# Patient Record
Sex: Male | Born: 1935 | Race: Black or African American | Hispanic: No | Marital: Married | Smoking: Never smoker
Health system: Southern US, Community
[De-identification: ages and names within clinical notes are randomized; demographics above are authoritative.]

## PROBLEM LIST (undated history)

## (undated) DIAGNOSIS — K264 Chronic or unspecified duodenal ulcer with hemorrhage: Secondary | ICD-10-CM

## (undated) DIAGNOSIS — F0390 Unspecified dementia without behavioral disturbance: Secondary | ICD-10-CM

## (undated) DIAGNOSIS — E119 Type 2 diabetes mellitus without complications: Secondary | ICD-10-CM

## (undated) DIAGNOSIS — E785 Hyperlipidemia, unspecified: Secondary | ICD-10-CM

## (undated) HISTORY — DX: Type 2 diabetes mellitus without complications: E11.9

## (undated) HISTORY — DX: Hyperlipidemia, unspecified: E78.5

## (undated) HISTORY — DX: Unspecified dementia, unspecified severity, without behavioral disturbance, psychotic disturbance, mood disturbance, and anxiety: F03.90

## (undated) HISTORY — DX: Chronic or unspecified duodenal ulcer with hemorrhage: K26.4

## (undated) HISTORY — PX: APPENDECTOMY: SHX54

---

## 2001-02-09 ENCOUNTER — Ambulatory Visit (HOSPITAL_COMMUNITY): Admission: RE | Admit: 2001-02-09 | Discharge: 2001-02-09 | Payer: Self-pay | Admitting: Gastroenterology

## 2006-05-25 ENCOUNTER — Emergency Department (HOSPITAL_COMMUNITY): Admission: EM | Admit: 2006-05-25 | Discharge: 2006-05-25 | Payer: Self-pay | Admitting: Emergency Medicine

## 2011-05-22 ENCOUNTER — Other Ambulatory Visit: Payer: Self-pay | Admitting: Gastroenterology

## 2011-05-22 ENCOUNTER — Ambulatory Visit (HOSPITAL_COMMUNITY)
Admission: RE | Admit: 2011-05-22 | Discharge: 2011-05-22 | Disposition: A | Discharge status: Home / Self Care | Payer: Medicare HMO | Source: Ambulatory Visit | Attending: Gastroenterology | Admitting: Gastroenterology

## 2011-05-22 DIAGNOSIS — D126 Benign neoplasm of colon, unspecified: Secondary | ICD-10-CM | POA: Insufficient documentation

## 2011-05-22 DIAGNOSIS — Z860100 Personal history of colon polyps, unspecified: Secondary | ICD-10-CM | POA: Insufficient documentation

## 2011-05-22 DIAGNOSIS — I10 Essential (primary) hypertension: Secondary | ICD-10-CM | POA: Insufficient documentation

## 2011-05-22 DIAGNOSIS — Z01812 Encounter for preprocedural laboratory examination: Secondary | ICD-10-CM | POA: Insufficient documentation

## 2011-05-22 DIAGNOSIS — Z79899 Other long term (current) drug therapy: Secondary | ICD-10-CM | POA: Insufficient documentation

## 2011-05-22 DIAGNOSIS — N400 Enlarged prostate without lower urinary tract symptoms: Secondary | ICD-10-CM | POA: Insufficient documentation

## 2011-05-22 DIAGNOSIS — Z1211 Encounter for screening for malignant neoplasm of colon: Secondary | ICD-10-CM | POA: Insufficient documentation

## 2011-05-22 DIAGNOSIS — E119 Type 2 diabetes mellitus without complications: Secondary | ICD-10-CM | POA: Insufficient documentation

## 2011-05-22 DIAGNOSIS — E7800 Pure hypercholesterolemia, unspecified: Secondary | ICD-10-CM | POA: Insufficient documentation

## 2011-05-22 LAB — GLUCOSE, CAPILLARY: Glucose-Capillary: 229 mg/dL — ABNORMAL HIGH (ref 70–99)

## 2011-05-29 NOTE — Op Note (Signed)
  [name], [name] NO.:  [id number]  MEDICAL RECORD NO.:  [id number]  LOCATION:  [facility]                         FACILITY:  [facility]  PHYSICIAN:  Danise Edge, M.D.   DATE OF BIRTH:  Jul 02, 1933  DATE OF PROCEDURE:  05/22/2011 DATE OF DISCHARGE:                              OPERATIVE REPORT   PROCEDURE:  Surveillance colonoscopy.  REFERRING PHYSICIAN:  Dr. Cam Hai.  HISTORY:  Ryan Gilmore is a 77 year old male, born on May 22, 2009.  In 2002, the patient underwent a screening colonoscopy with removal of adenomatous colon polyps.  In 2007, the patient's surveillance colonoscopy was normal.  ENDOSCOPIST:  Danise Edge, M.D.  PREMEDICATION:  Fentanyl 50 mcg, Versed 5 mg.  PROCEDURE IN DETAIL:  The patient was placed in the left lateral decubitus position.  Anal inspection and digital rectal exam were normal.  The Pentax pediatric colonoscope was introduced into the rectum and easily advanced to the cecum.  A normal-appearing ileocecal valve and appendiceal orifice were identified.  Colonic preparation for exam today was good.  Rectum normal.  Retroflexed view of the distal rectum normal.  Sigmoid colon.  A 3 mm sessile polyp was removed from the sigmoid colon with cold snare and submitted for pathologic interpretation.  Descending colon normal.  Splenic flexure normal.  Transverse colon normal.  Hepatic flexure normal.  Ascending colon.  From the distal ascending colon, a 5 mm sessile polyp was removed with cold snare and submitted for pathologic interpretation.  Cecum and ileocecal valve normal.  ASSESSMENT:  A small polyp was removed from the ascending colon and a small polyp was removed from the sigmoid colon with the cold snare. Both specimens were submitted in one bottle for pathological evaluation.  RECOMMENDATIONS:  Repeat surveillance colonoscopy in 5 years.          ______________________________ Danise Edge,  M.D.     MJ/MEDQ  D:  05/22/2011  T:  05/23/2011  Job:  [id number]  cc:   Dr. Cam Hai  Electronically Signed by Danise Edge M.D. on 05/29/2011 04:48:00 PM

## 2012-05-04 ENCOUNTER — Other Ambulatory Visit: Payer: Self-pay | Admitting: Family Medicine

## 2012-05-04 DIAGNOSIS — R413 Other amnesia: Secondary | ICD-10-CM

## 2012-05-06 ENCOUNTER — Ambulatory Visit
Admission: RE | Admit: 2012-05-06 | Discharge: 2012-05-06 | Disposition: A | Discharge status: Home / Self Care | Payer: Medicare HMO | Source: Ambulatory Visit | Attending: Family Medicine | Admitting: Family Medicine

## 2012-05-06 DIAGNOSIS — R413 Other amnesia: Secondary | ICD-10-CM

## 2012-05-06 MED ORDER — GADOBENATE DIMEGLUMINE 529 MG/ML IV SOLN
18.0000 mL | Freq: Once | INTRAVENOUS | Status: AC | PRN
  Administered 2012-05-06: 18 mL via INTRAVENOUS

## 2013-06-27 ENCOUNTER — Ambulatory Visit: Payer: Medicare HMO | Admitting: Endocrinology

## 2013-06-28 ENCOUNTER — Other Ambulatory Visit: Payer: Self-pay | Admitting: *Deleted

## 2013-06-28 ENCOUNTER — Ambulatory Visit (INDEPENDENT_AMBULATORY_CARE_PROVIDER_SITE_OTHER): Payer: Medicare HMO | Admitting: Endocrinology

## 2013-06-28 ENCOUNTER — Encounter: Payer: Self-pay | Admitting: Endocrinology

## 2013-06-28 VITALS — BP 120/80 | HR 86 | Temp 97.9°F | Ht 69.0 in | Wt 192.4 lb

## 2013-06-28 DIAGNOSIS — E782 Mixed hyperlipidemia: Secondary | ICD-10-CM | POA: Insufficient documentation

## 2013-06-28 DIAGNOSIS — IMO0001 Reserved for inherently not codable concepts without codable children: Secondary | ICD-10-CM

## 2013-06-28 DIAGNOSIS — E119 Type 2 diabetes mellitus without complications: Secondary | ICD-10-CM | POA: Insufficient documentation

## 2013-06-28 DIAGNOSIS — Z23 Encounter for immunization: Secondary | ICD-10-CM

## 2013-06-28 DIAGNOSIS — E785 Hyperlipidemia, unspecified: Secondary | ICD-10-CM | POA: Insufficient documentation

## 2013-06-28 LAB — COMPREHENSIVE METABOLIC PANEL
ALT: 14 U/L (ref 0–53)
AST: 16 U/L (ref 0–37)
Albumin: 4.2 g/dL (ref 3.5–5.2)
CO2: 31 mEq/L (ref 19–32)
Calcium: 9.5 mg/dL (ref 8.4–10.5)
Chloride: 103 mEq/L (ref 96–112)
GFR: 96.25 mL/min (ref >60.00)
Potassium: 4 mEq/L (ref 3.5–5.1)
Sodium: 138 mEq/L (ref 135–145)
Total Protein: 7 g/dL (ref 6.0–8.3)

## 2013-06-28 LAB — LIPID PANEL
LDL Cholesterol: 110 mg/dL — ABNORMAL HIGH (ref 0–99)
Total CHOL/HDL Ratio: 4
Triglycerides: 71 mg/dL (ref 0.0–149.0)

## 2013-06-28 LAB — MICROALBUMIN / CREATININE URINE RATIO
Creatinine,U: 23.7 mg/dL
Microalb Creat Ratio: 0.4 mg/g (ref 0.0–30.0)
Microalb, Ur: 0.1 mg/dL (ref 0.0–1.9)

## 2013-06-28 NOTE — Patient Instructions (Addendum)
Exercise at least 4x per week  Please check blood sugars at least half the time about 2 hours after any meal and as directed on waking up. Please bring blood sugar monitor to each visit  Watch diet

## 2013-06-28 NOTE — Progress Notes (Addendum)
Patient ID: Ryan Gilmore, male   DOB: [birth year], 79 y.o.   MRN: [id number]  Ryan Gilmore is an 79 y.o. male.   Reason for Appointment: Diabetes follow-up   History of Present Illness   Diagnosis: Type 2 DIABETES MELITUS, date of diagnosis: ? 2005     Previous history: He has previously been on metformin, Amaryl and Januvia for diabetes management but has shown progressive increase in his blood sugars over the last few years requiring escalation of treatment. Also has had difficulty with compliance with diet in the past but had done better with dietary education. Because of inadequate control Actos was added in 01/2013 and was increased to 30 mg in 6/14  Recent history: Although his A1c had come down to 7% in 2/14 his blood sugars appear to be relatively higher since then. On his last visit his Actos was increased, this was started in 4/14 but his overall blood sugars are about the same He is again not exercising and not motivated to do so. His wife is stating that he is quite noncompliant with diet especially eating sweets He is checking his blood sugar sporadically and mostly in the morning. Blood sugars are consistently high later in the day     Oral hypoglycemic drugs: Actos, metformin and Januvia        Side effects from medications: None       Monitors blood glucose: Once a day.    Glucometer: One Touch.          Blood Glucose readings from meter download: readings before breakfast: 117-153 with median about 140. Before and after supper 171-263 with only 4 readings  Hypoglycemia frequency:  none        Meals: 3 meals per day.          Physical activity: exercise: Minimal, occasionally playing softball           Dietician visit: Most recent: 11/2012         The last HbgA1c was reported as 7.8%, previous range 7-10.7%    Wt Readings from Last 3 Encounters:  06/28/13 192 lb 6.4 oz (87.272 kg)    LABS:  Office Visit on 06/28/2013  Component Date Value Range Status  . Microalb, Ur  06/28/2013 0.1  0.0 - 1.9 mg/dL Final  . Creatinine,U 40/98/1191 23.7   Final  . Microalb Creat Ratio 06/28/2013 0.4  0.0 - 30.0 mg/g Final  . Hemoglobin A1C 06/28/2013 7.9* 4.6 - 6.5 % Final   Glycemic Control Guidelines for People with Diabetes:Non Diabetic:  <6%Goal of Therapy: <7%Additional Action Suggested:  >8%   . Cholesterol 06/28/2013 170  0 - 200 mg/dL Final   ATP III Classification       Desirable:  < 200 mg/dL               Borderline High:  200 - 239 mg/dL          High:  > = 478 mg/dL  . Triglycerides 06/28/2013 71.0  0.0 - 149.0 mg/dL Final   Normal:  <295 mg/dLBorderline High:  150 - 199 mg/dL  . HDL 06/28/2013 45.70  >39.00 mg/dL Final  . VLDL 62/13/0865 14.2  0.0 - 40.0 mg/dL Final  . LDL Cholesterol 06/28/2013 110* 0 - 99 mg/dL Final  . Total CHOL/HDL Ratio 06/28/2013 4   Final                  Men  Women1/2 Average Risk     3.4          3.3Average Risk          5.0          4.42X Average Risk          9.6          7.13X Average Risk          15.0          11.0                      . Sodium 06/28/2013 138  135 - 145 mEq/L Final  . Potassium 06/28/2013 4.0  3.5 - 5.1 mEq/L Final  . Chloride 06/28/2013 103  96 - 112 mEq/L Final  . CO2 06/28/2013 31  19 - 32 mEq/L Final  . Glucose, Bld 06/28/2013 222* 70 - 99 mg/dL Final  . BUN 81/19/1478 10  6 - 23 mg/dL Final  . Creatinine, Ser 06/28/2013 1.0  0.4 - 1.5 mg/dL Final  . Total Bilirubin 06/28/2013 0.7  0.3 - 1.2 mg/dL Final  . Alkaline Phosphatase 06/28/2013 42  39 - 117 U/L Final  . AST 06/28/2013 16  0 - 37 U/L Final  . ALT 06/28/2013 14  0 - 53 U/L Final  . Total Protein 06/28/2013 7.0  6.0 - 8.3 g/dL Final  . Albumin 29/56/2130 4.2  3.5 - 5.2 g/dL Final  . Calcium 86/57/8469 9.5  8.4 - 10.5 mg/dL Final  . GFR 62/95/2841 96.25  >60.00 mL/min Final      Medication List       This list is accurate as of: 06/28/13 11:59 PM.  Always use your most recent med list.               glimepiride 4 MG tablet   Commonly known as:  AMARYL  Take 4 mg by mouth. 1/2 twice daily     JANUVIA 100 MG tablet  Generic drug:  sitaGLIPtin  Take 100 mg by mouth daily.     metFORMIN 500 MG tablet  Commonly known as:  GLUCOPHAGE  Take 1,000 mg by mouth 2 (two) times daily with a meal.     ONE TOUCH ULTRA TEST test strip  Generic drug:  glucose blood  1 each by Other route daily.     pioglitazone 30 MG tablet  Commonly known as:  ACTOS  Take 30 mg by mouth daily.     rosuvastatin 40 MG tablet  Commonly known as:  CRESTOR  Take 40 mg by mouth daily.        Allergies: Not on File  History reviewed. No pertinent past medical history.  History reviewed. No pertinent past surgical history.  History reviewed. No pertinent family history.  Social History:  reports that he has never smoked. His smokeless tobacco use includes Chew. His alcohol and drug histories are not on file.  Review of Systems:  Hypertension:  he has been on Avapro for this and blood pressure is excellent without lightheadedness  Lipids: Has previously had taking Lipitor and his last LDL was 77, probably changed to Crestor by PCP Has history of erectile dysfunction No history of tingling or numbness in his feet No leg edema      Examination:   BP 120/80  Pulse 86  Temp(Src) 97.9 F (36.6 C) (Oral)  Wt 192 lb 6.4 oz (87.272 kg)  SpO2 97%  There is no height on file  to calculate BMI.    ASSESSMENT/ PLAN::   Diabetes type 2   Blood glucose control is somewhat worse and this probably indicates some progression of his diabetes he is on multiple drugs already. 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 Advised him that this should help him with dietary compliance and carbohydrate cravings as well as weight loss, satiety and better postprandial  blood sugars. Discussed possible side effects and he will considered this on the next visit Encouraged him to watch his diet better and start walking regularly for exercise He will check more readings after meals also Discussed blood sugar targets, timing of glucose monitoring, weight loss  Hypertension: Continue Avapro  Hyperlipidemia: Not clear if he is taking his Crestor regularly and his LDL is much higher than usual. Will forward information to PCP  Counseling time over 50% of today's 25 minute visit  Burnham Trost 06/29/2013, 1:01 PM   Addendum: Apparently not taking Avapro or Crestor. Given him the option of using Lipitor 20 mg daily for lower cost and continuing to watch his blood pressure without medication for now

## 2013-06-29 ENCOUNTER — Encounter: Payer: Self-pay | Admitting: Endocrinology

## 2013-08-25 ENCOUNTER — Other Ambulatory Visit (INDEPENDENT_AMBULATORY_CARE_PROVIDER_SITE_OTHER): Payer: Commercial Managed Care - HMO

## 2013-08-25 LAB — FRUCTOSAMINE: Fructosamine: 319 umol/L — ABNORMAL HIGH (ref <285)

## 2013-08-25 LAB — BASIC METABOLIC PANEL
BUN: 8 mg/dL (ref 6–23)
Calcium: 9.5 mg/dL (ref 8.4–10.5)
Creatinine, Ser: 0.8 mg/dL (ref 0.4–1.5)

## 2013-08-29 ENCOUNTER — Ambulatory Visit (INDEPENDENT_AMBULATORY_CARE_PROVIDER_SITE_OTHER): Payer: Commercial Managed Care - HMO | Admitting: Endocrinology

## 2013-08-29 ENCOUNTER — Encounter: Payer: Self-pay | Admitting: Endocrinology

## 2013-08-29 VITALS — BP 118/70 | HR 70 | Temp 98.4°F | Resp 12 | Ht 69.0 in | Wt 197.3 lb

## 2013-08-29 DIAGNOSIS — E785 Hyperlipidemia, unspecified: Secondary | ICD-10-CM

## 2013-08-29 NOTE — Progress Notes (Addendum)
Patient ID: Ryan Gilmore, male   DOB: 10-18-1932, 79 y.o.   MRN: [id number]  Ryan Gilmore is an 79 y.o. male.   Reason for Appointment: Diabetes follow-up   History of Present Illness   Diagnosis: Type 2 DIABETES MELITUS, date of diagnosis: ? 2005     Previous history: He has previously been on metformin, Amaryl and Januvia for diabetes management but has shown progressive increase in his blood sugars over the last few years requiring escalation of treatment. Also has had difficulty with compliance with diet in the past but had done better with dietary education. Because of inadequate control Actos was added in 01/2013 and was increased to 30 mg in 6/14 Although his A1c had come down to 7% his blood sugars appeared to be relatively higher since then.  Recent history:  In 9/14 his A1c had increased to 7.9 and he was given the option of switching Januvia to Victoza. However he was reluctant to start an injectable medication and wanted to control the blood sugars better with diet and exercise Amaryl not increased because of potential for hypoglycemia He is now on a 4 drug regimen His blood sugars have been higher periodically after meals and his wife thinks that he is not watching his diet and eating sweets at times Fructosamine indicates overall high readings. He is again not exercising and not motivated to do so.  Blood sugars are frequently high after breakfast and supper and occasionally after lunch also. Highest glucose 237     Oral hypoglycemic drugs: Actos, Amaryl, metformin and Januvia        Side effects from medications: None       Monitors blood glucose: Once a day.    Glucometer: One Touch.          Blood Glucose readings from meter download: readings before breakfast: 100-169, nonfasting 88-237 with periodic high readings late morning, late afternoon and late evening but overall median 128, checking mostly in the morning. Accuracy of download not clear since some readings had  incorrect date and time  Hypoglycemia frequency:  none        Meals: 2 meals per day: 9 AM,  2 PM.some snacks in the evening           Physical activity: exercise: Minimal, previously playing softball           Dietician visit: Most recent: 11/2012          Wt Readings from Last 3 Encounters:  08/29/13 197 lb 4.8 oz (89.495 kg)  06/28/13 192 lb 6.4 oz (87.272 kg)    LABS:  Lab Results  Component Value Date   HGBA1C 7.9* 06/28/2013   Lab Results  Component Value Date   MICROALBUR 0.1 06/28/2013   LDLCALC 110* 06/28/2013   CREATININE 0.8 08/25/2013     Appointment on 08/25/2013  Component Date Value Range Status  . Sodium 08/25/2013 135  135 - 145 mEq/L Final  . Potassium 08/25/2013 4.3  3.5 - 5.1 mEq/L Final  . Chloride 08/25/2013 101  96 - 112 mEq/L Final  . CO2 08/25/2013 28  19 - 32 mEq/L Final  . Glucose, Bld 08/25/2013 150* 70 - 99 mg/dL Final  . BUN 62/95/2841 8  6 - 23 mg/dL Final  . Creatinine, Ser 08/25/2013 0.8  0.4 - 1.5 mg/dL Final  . Calcium 32/44/0102 9.5  8.4 - 10.5 mg/dL Final  . GFR 72/53/6644 115.17  >60.00 mL/min Final  . Fructosamine 08/25/2013 319* <285  umol/L Final   Comment:                            Variations in levels of serum proteins (albumin and immunoglobulins)                          may affect fructosamine results.                                 Medication List       This list is accurate as of: 08/29/13 10:50 AM.  Always use your most recent med list.               glimepiride 4 MG tablet  Commonly known as:  AMARYL  Take 4 mg by mouth. 1/2 twice daily     JANUVIA 100 MG tablet  Generic drug:  sitaGLIPtin  Take 100 mg by mouth daily.     metFORMIN 500 MG tablet  Commonly known as:  GLUCOPHAGE  Take 1,000 mg by mouth 2 (two) times daily with a meal.     ONE TOUCH ULTRA TEST test strip  Generic drug:  glucose blood  1 each by Other route daily.     pioglitazone 30 MG tablet  Commonly known as:  ACTOS  Take 30 mg by  mouth daily.     rosuvastatin 40 MG tablet  Commonly known as:  CRESTOR  Take 40 mg by mouth daily.        Allergies: No Known Allergies  No past medical history on file.  No past surgical history on file.  Family History  Problem Relation Age of Onset  . Heart disease Father   . Diabetes Brother     Social History:  reports that he has never smoked. His smokeless tobacco use includes Chew. His alcohol and drug histories are not on file.  Review of Systems:  Hypertension:  he had been on Avapro for this and now blood pressure appears normal without any medication  Lipids: Has previously had taking Lipitor and his last LDL was 77, changed to Crestor by PCP: Taking 1/2 of 20mg  3/7 days a week for fear of side effects Has history of erectile dysfunction No history of tingling or numbness in his feet No leg edema      Examination:   BP 118/70  Pulse 70  Temp(Src) 98.4 F (36.9 C)  Resp 12  Ht 5\' 9"  (1.753 m)  Wt 197 lb 4.8 oz (89.495 kg)  BMI 29.12 kg/m2  SpO2 97%  Body mass index is 29.12 kg/(m^2).    ASSESSMENT/ PLAN::   Diabetes type 2:  Blood glucose control is still not optimal especially with high readings after meals when he is not watching his diet His fructosamine still indicates overall high readings Also he has gained significant amount of weight He has not been very motivated to exercise or follow his diet Had discussed Victoza on the last visit and again discussed this today for improving his postprandial readings, potential for weight loss, better preservation of beta cell function and avoiding insulin. Also discussed that this is not insulin again However he is still reluctant to start this time he agrees to see the dietitian for followup and review of his meal planning He will also start walking regularly either outside or at the gym He will continue  his current medications unchanged and followup in 2 months with an A1c  He will check more  readings after meals instead of mostly in the mornings which he is doing now Discussed blood sugar targets, timing of glucose monitoring, weight loss  ? Hypertension: Currently blood pressure is quite normal without medications  Hyperlipidemia: Apparently taking Crestor 20 mg, half tablet 3 times a week. Followup LDL be needed. Reassured him that this should be safe in the long run  Counseling time over 50% of today's 25 minute visit  Festus Pursel 08/29/2013, 10:50 AM

## 2013-08-29 NOTE — Patient Instructions (Signed)
Please check blood sugars at least half the time about 2 hours after any meal and as directed on waking up. Please bring blood sugar monitor to each visit  Januvia 100mg  once daily  Walk daily 30 min or more

## 2013-09-22 ENCOUNTER — Other Ambulatory Visit: Payer: Self-pay | Admitting: *Deleted

## 2013-09-22 MED ORDER — PIOGLITAZONE HCL 30 MG PO TABS: 30.0000 mg | ORAL_TABLET | Freq: Every day | ORAL | Status: DC

## 2013-09-28 ENCOUNTER — Other Ambulatory Visit: Payer: Self-pay | Admitting: *Deleted

## 2013-10-31 ENCOUNTER — Other Ambulatory Visit (INDEPENDENT_AMBULATORY_CARE_PROVIDER_SITE_OTHER): Payer: Medicare HMO

## 2013-10-31 DIAGNOSIS — IMO0001 Reserved for inherently not codable concepts without codable children: Secondary | ICD-10-CM

## 2013-10-31 DIAGNOSIS — E1165 Type 2 diabetes mellitus with hyperglycemia: Principal | ICD-10-CM

## 2013-10-31 LAB — COMPREHENSIVE METABOLIC PANEL
ALBUMIN: 4.2 g/dL (ref 3.5–5.2)
ALK PHOS: 39 U/L (ref 39–117)
ALT: 16 U/L (ref 0–53)
AST: 17 U/L (ref 0–37)
BUN: 8 mg/dL (ref 6–23)
CO2: 29 mEq/L (ref 19–32)
Calcium: 9.3 mg/dL (ref 8.4–10.5)
Chloride: 103 mEq/L (ref 96–112)
Creatinine, Ser: 0.9 mg/dL (ref 0.4–1.5)
GFR: 103.52 mL/min (ref >60.00)
GLUCOSE: 178 mg/dL — AB (ref 70–99)
POTASSIUM: 4.3 meq/L (ref 3.5–5.1)
SODIUM: 138 meq/L (ref 135–145)
TOTAL PROTEIN: 7 g/dL (ref 6.0–8.3)
Total Bilirubin: 0.7 mg/dL (ref 0.3–1.2)

## 2013-10-31 LAB — LIPID PANEL
CHOL/HDL RATIO: 4
CHOLESTEROL: 173 mg/dL (ref 0–200)
HDL: 44.5 mg/dL (ref >39.00)
LDL CALC: 112 mg/dL — AB (ref 0–99)
Triglycerides: 83 mg/dL (ref 0.0–149.0)
VLDL: 16.6 mg/dL (ref 0.0–40.0)

## 2013-10-31 LAB — HEMOGLOBIN A1C: Hgb A1c MFr Bld: 8 % — ABNORMAL HIGH (ref 4.6–6.5)

## 2013-11-03 ENCOUNTER — Encounter: Payer: Self-pay | Admitting: Endocrinology

## 2013-11-03 ENCOUNTER — Ambulatory Visit (INDEPENDENT_AMBULATORY_CARE_PROVIDER_SITE_OTHER): Payer: Medicare HMO | Admitting: Endocrinology

## 2013-11-03 VITALS — BP 128/82 | HR 70 | Temp 98.2°F | Resp 16 | Ht 69.0 in | Wt 199.8 lb

## 2013-11-03 DIAGNOSIS — E1165 Type 2 diabetes mellitus with hyperglycemia: Principal | ICD-10-CM

## 2013-11-03 DIAGNOSIS — E785 Hyperlipidemia, unspecified: Secondary | ICD-10-CM

## 2013-11-03 DIAGNOSIS — IMO0001 Reserved for inherently not codable concepts without codable children: Secondary | ICD-10-CM

## 2013-11-03 MED ORDER — CANAGLIFLOZIN 100 MG PO TABS: 100.0000 mg | ORAL_TABLET | Freq: Every day | ORAL | Status: DC

## 2013-11-03 NOTE — Patient Instructions (Addendum)
Please check blood sugars at least half the time about 2 hours after any meal and as directed on waking up.   Please bring blood sugar monitor to each visit  Invokana 100mg  in am before Bfst; when Actos out leave it off  Crestor 3x per week

## 2013-11-03 NOTE — Progress Notes (Signed)
Patient ID: Ryan Gilmore, male   DOB: 11/25/1932, 80 y.o.   MRN: [id number]   Reason for Appointment: Diabetes follow-up   History of Present Illness   Diagnosis: Type 2 DIABETES MELITUS, date of diagnosis: ? 2005     Previous history: He has previously been on metformin, Amaryl and Januvia for diabetes management but has shown progressive increase in his blood sugars over the last few years requiring escalation of treatment. Also has had difficulty with compliance with diet in the past but had done better with dietary education. Because of inadequate control Actos was added in 01/2013 and was increased to 30 mg in 6/14 Although his A1c had come down to 7% initially In 9/14 his A1c had increased to 7.9  Recent history:  His A1c is still relatively high and not controlled with current regimen of Januvia, Amaryl, metformin and Actos On his last visit he was given the and no driving Victoza but he refused Also he does not appear to be well motivated to lose weight or exercise Is also taking his blood sugars only in the morning and did not understand that postprandial blood sugars may be consistently high causing his high A1c; also on the last visit fructosamine was high     Oral hypoglycemic drugs: Actos 30 mg, Amaryl, metformin and Januvia        Side effects from medications: None       Monitors blood glucose: Once a day.    Glucometer: One Touch.          Blood Glucose readings from meter download: readings before breakfast: 98-183, median 124 Nonfasting 122, 194 Hypoglycemia frequency:  none        Meals: 2 meals per day: 9 AM,  2 PM. Has some snacks in the evening           Physical activity: exercise: none recently           Dietician visit: Most recent: 11/2012          Wt Readings from Last 3 Encounters:  11/03/13 199 lb 12.8 oz (90.629 kg)  08/29/13 197 lb 4.8 oz (89.495 kg)  06/28/13 192 lb 6.4 oz (87.272 kg)    LABS:  Lab Results  Component Value Date   HGBA1C 8.0*  10/31/2013   HGBA1C 7.9* 06/28/2013   Lab Results  Component Value Date   MICROALBUR 0.1 06/28/2013   LDLCALC 112* 10/31/2013   CREATININE 0.9 10/31/2013     Appointment on 10/31/2013  Component Date Value Range Status  . Hemoglobin A1C 10/31/2013 8.0* 4.6 - 6.5 % Final   Glycemic Control Guidelines for People with Diabetes:Non Diabetic:  <6%Goal of Therapy: <7%Additional Action Suggested:  >8%   . Sodium 10/31/2013 138  135 - 145 mEq/L Final  . Potassium 10/31/2013 4.3  3.5 - 5.1 mEq/L Final  . Chloride 10/31/2013 103  96 - 112 mEq/L Final  . CO2 10/31/2013 29  19 - 32 mEq/L Final  . Glucose, Bld 10/31/2013 178* 70 - 99 mg/dL Final  . BUN 10/31/2013 8  6 - 23 mg/dL Final  . Creatinine, Ser 10/31/2013 0.9  0.4 - 1.5 mg/dL Final  . Total Bilirubin 10/31/2013 0.7  0.3 - 1.2 mg/dL Final  . Alkaline Phosphatase 10/31/2013 39  39 - 117 U/L Final  . AST 10/31/2013 17  0 - 37 U/L Final  . ALT 10/31/2013 16  0 - 53 U/L Final  . Total Protein 10/31/2013 7.0  6.0 -  8.3 g/dL Final  . Albumin 10/31/2013 4.2  3.5 - 5.2 g/dL Final  . Calcium 10/31/2013 9.3  8.4 - 10.5 mg/dL Final  . GFR 10/31/2013 103.52  >60.00 mL/min Final  . Cholesterol 10/31/2013 173  0 - 200 mg/dL Final   ATP III Classification       Desirable:  < 200 mg/dL               Borderline High:  200 - 239 mg/dL          High:  > = 240 mg/dL  . Triglycerides 10/31/2013 83.0  0.0 - 149.0 mg/dL Final   Normal:  <150 mg/dLBorderline High:  150 - 199 mg/dL  . HDL 10/31/2013 44.50  >39.00 mg/dL Final  . VLDL 10/31/2013 16.6  0.0 - 40.0 mg/dL Final  . LDL Cholesterol 10/31/2013 112* 0 - 99 mg/dL Final  . Total CHOL/HDL Ratio 10/31/2013 4   Final                  Men          Women1/2 Average Risk     3.4          3.3Average Risk          5.0          4.42X Average Risk          9.6          7.13X Average Risk          15.0          11.0                          Medication List       This list is accurate as of: 11/03/13  8:43 AM.   Always use your most recent med list.               glimepiride 4 MG tablet  Commonly known as:  AMARYL  Take 4 mg by mouth. 1/2 twice daily     JANUVIA 100 MG tablet  Generic drug:  sitaGLIPtin  Take 100 mg by mouth daily.     metFORMIN 500 MG tablet  Commonly known as:  GLUCOPHAGE  Take 1,000 mg by mouth 2 (two) times daily with a meal.     ONE TOUCH ULTRA TEST test strip  Generic drug:  glucose blood  1 each by Other route daily.     pioglitazone 30 MG tablet  Commonly known as:  ACTOS  Take 1 tablet (30 mg total) by mouth daily.     rosuvastatin 40 MG tablet  Commonly known as:  CRESTOR  Take 40 mg by mouth daily.        Allergies: No Known Allergies  No past medical history on file.  No past surgical history on file.  Family History  Problem Relation Age of Onset  . Heart disease Father   . Diabetes Brother     Social History:  reports that he has never smoked. His smokeless tobacco use includes Chew. His alcohol and drug histories are not on file.  Review of Systems:  Hypertension:  he had been on Avapro for this and now blood pressure appears normal without any medication  Lipids: Has previously had taking Lipitor and his last LDL was 77, changed to Crestor by PCP: Taking 1/2 of 20mg  only once a week now for fear of side effects and LDL  is higher  Lab Results  Component Value Date   CHOL 173 10/31/2013   HDL 44.50 10/31/2013   LDLCALC 112* 10/31/2013   TRIG 83.0 10/31/2013   CHOLHDL 4 10/31/2013     Examination:   BP 128/82  Pulse 70  Temp(Src) 98.2 F (36.8 C)  Resp 16  Ht 5\' 9"  (1.753 m)  Wt 199 lb 12.8 oz (90.629 kg)  BMI 29.49 kg/m2  SpO2 97%  Body mass index is 29.49 kg/(m^2).   No ankle edema present  ASSESSMENT/ PLAN::   Diabetes type 2:  Blood glucose control is still poorly controlled with A1c 8% Usually has high postprandial readings but he is not monitoring these now Discussed that he has some progression of diabetes and  needs more efforts to control his diabetes including change of medications He is still reluctant to try a GLP-1 drug Also did not wish to see the dietitian again  Recommendations:  He agrees to try Invokana. Discussed actions, benefits, possible side effects and dosage. He will try 100 mg daily until next visit, samples, brochures and co-pay card given Since he is not benefiting currently from Actos will stop this when the prescription runs out He will check more readings after meals instead of mostly in the mornings which he is doing now His wife will help him be better compliant with his diet and exercise regimen and he will start walking Written instructions given  Hyperlipidemia: Apparently taking Crestor 10 mg only once week. LDL over 100 now He agrees to start taking this at least 3 times a week for better control of lipids and cardiovascular protection  Counseling time over 50% of today's 25 minute visit  Obediah Welles 11/03/2013, 8:43 AM

## 2013-12-15 ENCOUNTER — Other Ambulatory Visit (INDEPENDENT_AMBULATORY_CARE_PROVIDER_SITE_OTHER): Payer: Commercial Managed Care - HMO

## 2013-12-15 DIAGNOSIS — E1165 Type 2 diabetes mellitus with hyperglycemia: Principal | ICD-10-CM

## 2013-12-15 DIAGNOSIS — IMO0001 Reserved for inherently not codable concepts without codable children: Secondary | ICD-10-CM

## 2013-12-15 LAB — BASIC METABOLIC PANEL
BUN: 14 mg/dL (ref 6–23)
CO2: 29 mEq/L (ref 19–32)
Calcium: 9.4 mg/dL (ref 8.4–10.5)
Chloride: 103 mEq/L (ref 96–112)
Creatinine, Ser: 1 mg/dL (ref 0.4–1.5)
GFR: 96.13 mL/min (ref >60.00)
GLUCOSE: 152 mg/dL — AB (ref 70–99)
POTASSIUM: 4.3 meq/L (ref 3.5–5.1)
SODIUM: 138 meq/L (ref 135–145)

## 2013-12-19 LAB — FRUCTOSAMINE: Fructosamine: 350 umol/L — ABNORMAL HIGH (ref 190–270)

## 2013-12-21 ENCOUNTER — Ambulatory Visit (INDEPENDENT_AMBULATORY_CARE_PROVIDER_SITE_OTHER): Payer: Commercial Managed Care - HMO | Admitting: Endocrinology

## 2013-12-21 ENCOUNTER — Encounter: Payer: Self-pay | Admitting: Endocrinology

## 2013-12-21 VITALS — BP 118/72 | HR 68 | Temp 97.8°F | Resp 16 | Ht 69.0 in | Wt 201.0 lb

## 2013-12-21 DIAGNOSIS — IMO0001 Reserved for inherently not codable concepts without codable children: Secondary | ICD-10-CM

## 2013-12-21 DIAGNOSIS — E1165 Type 2 diabetes mellitus with hyperglycemia: Principal | ICD-10-CM

## 2013-12-21 NOTE — Patient Instructions (Signed)
Please check blood sugars at least half the time about 2 hours after any meal and as directed on waking up. Please bring blood sugar monitor to each visit  Protein at Bfst, low fat meals

## 2013-12-21 NOTE — Progress Notes (Signed)
Patient ID: Ryan Gilmore, male   DOB: [birth year], 80 y.o.   MRN: [id number]   Reason for Appointment: Diabetes follow-up   History of Present Illness   Diagnosis: Type 2 DIABETES MELITUS, date of diagnosis: ? 2005     Previous history: He has previously been on metformin, Amaryl and Januvia for diabetes management but has shown progressive increase in his blood sugars over the last few years requiring escalation of treatment. Also has had difficulty with compliance with diet in the past but had done better with dietary education. Because of inadequate control Actos was added in 01/2013 and was increased to 30 mg in 6/14 Although his A1c had come down to 7% initially In 9/14 his A1c had increased to 7.9  Recent history:  His fructosamine is relatively higher  Because of higher A1c was asked to start Invokana on his last visit. However apparently was not covered by his insurance and he could not start taking it and also did not let us know about it Currently on a  regimen of Januvia, Amaryl, metformin and Actos Recently his fasting blood sugars are better but overall they are still averaging the same as last time He has checked only a few readings sporadically after meals and some of them are high especially after breakfast He is still very reluctant to consider additional medications or changes Occasionally still goes off his diet Has not started exercising consistently as yet    Oral hypoglycemic drugs: Actos 30 mg, Amaryl, metformin and Januvia        Side effects from medications: None       Monitors blood glucose: Once a day.    Glucometer: One Touch.          Blood Glucose readings from meter download:   PREMEAL Breakfast Lunch Dinner  late p.m.  Overall  Glucose range:  90-181   133-263   93-167   141-194    Mean/median:  125   152    183   133    Hypoglycemia frequency:  none        Meals: 2 meals per day: 9 AM,  2 PM. Oatmeal with root in am; Has some snacks in the evening, less  fast food and fried food recently           Physical activity: exercise: none recently           Dietician visit: Most recent: 11/2012          Wt Readings from Last 3 Encounters:  12/21/13 201 lb (91.173 kg)  11/03/13 199 lb 12.8 oz (90.629 kg)  08/29/13 197 lb 4.8 oz (89.495 kg)    LABS:  Lab Results  Component Value Date   HGBA1C 8.0* 10/31/2013   HGBA1C 7.9* 06/28/2013   Lab Results  Component Value Date   MICROALBUR 0.1 06/28/2013   LDLCALC 112* 10/31/2013   CREATININE 1.0 12/15/2013     Appointment on 12/15/2013  Component Date Value Ref Range Status  . Fructosamine 12/15/2013 350* 190 - 270 umol/L Final  . Sodium 12/15/2013 138  135 - 145 mEq/L Final  . Potassium 12/15/2013 4.3  3.5 - 5.1 mEq/L Final  . Chloride 12/15/2013 103  96 - 112 mEq/L Final  . CO2 12/15/2013 29  19 - 32 mEq/L Final  . Glucose, Bld 12/15/2013 152* 70 - 99 mg/dL Final  . BUN 12/15/2013 14  6 - 23 mg/dL Final  . Creatinine, Ser 12/15/2013 1.0  0.4 -  1.5 mg/dL Final  . Calcium 12/15/2013 9.4  8.4 - 10.5 mg/dL Final  . GFR 12/15/2013 96.13  >60.00 mL/min Final      Medication List       This list is accurate as of: 12/21/13  9:22 AM.  Always use your most recent med list.               Canagliflozin 100 MG Tabs  Take 1 tablet (100 mg total) by mouth daily before breakfast.     glimepiride 4 MG tablet  Commonly known as:  AMARYL  Take 4 mg by mouth. 1/2 twice daily     JANUVIA 100 MG tablet  Generic drug:  sitaGLIPtin  Take 100 mg by mouth daily.     metFORMIN 500 MG tablet  Commonly known as:  GLUCOPHAGE  Take 1,000 mg by mouth 2 (two) times daily with a meal.     ONE TOUCH ULTRA TEST test strip  Generic drug:  glucose blood  1 each by Other route daily.     pioglitazone 30 MG tablet  Commonly known as:  ACTOS  Take 1 tablet (30 mg total) by mouth daily.     rosuvastatin 40 MG tablet  Commonly known as:  CRESTOR  Take 40 mg by mouth daily.        Allergies: No Known  Allergies  No past medical history on file.  No past surgical history on file.  Family History  Problem Relation Age of Onset  . Heart disease Father   . Diabetes Brother     Social History:  reports that he has never smoked. His smokeless tobacco use includes Chew. His alcohol and drug histories are not on file.  Review of Systems:  Hypertension:  he had been on Avapro for this and now blood pressure appears normal without any medication  Lipids: Has previously had been taking Lipitor and previously LDL was 77, changed to Crestor by PCP:  Taking 1/2 of 20mg  only once a week now for fear of side effects and LDL is higher  Lab Results  Component Value Date   CHOL 173 10/31/2013   HDL 44.50 10/31/2013   LDLCALC 112* 10/31/2013   TRIG 83.0 10/31/2013   CHOLHDL 4 10/31/2013     Examination:   BP 118/72  Pulse 68  Temp(Src) 97.8 F (36.6 C)  Resp 16  Ht 5\' 9"  (1.753 m)  Wt 201 lb (91.173 kg)  BMI 29.67 kg/m2  SpO2 93%  Body mass index is 29.67 kg/(m^2).   No ankle edema present  ASSESSMENT/ PLAN::   Diabetes type 2:  Blood glucose control is still poorly controlled with high fructosamine and previous A1c was 8% Although he is a good candidate for Invokana it is apparently not covered by insurance He is reluctant to consider changing his medications because he thinks he can do much better with diet and also more regular exercise Currently does have inconsistent diet and appears to understand the need for improving this Frequently has high postprandial readings but he is not monitoring these very much and this was discussed today  Recommendations: Have mostly repeated whatever we had discussed on the last visit to improve his compliance He was also checked to see if his insurance covers alternatives to Invokana Continue present medications including Actos for now He will check more readings after meals instead of mostly in the mornings which he is doing now His wife  will help him be better compliant with his  diet and exercise regimen and he will start walking Written instructions given  Allanna Bresee 12/21/2013, 9:22 AM

## 2014-02-13 ENCOUNTER — Other Ambulatory Visit: Payer: Self-pay | Admitting: *Deleted

## 2014-02-13 MED ORDER — GLUCOSE BLOOD VI STRP: ORAL_STRIP | Status: DC

## 2014-02-20 ENCOUNTER — Other Ambulatory Visit (INDEPENDENT_AMBULATORY_CARE_PROVIDER_SITE_OTHER): Payer: Commercial Managed Care - HMO

## 2014-02-20 DIAGNOSIS — E1165 Type 2 diabetes mellitus with hyperglycemia: Principal | ICD-10-CM

## 2014-02-20 DIAGNOSIS — IMO0001 Reserved for inherently not codable concepts without codable children: Secondary | ICD-10-CM

## 2014-02-20 LAB — COMPREHENSIVE METABOLIC PANEL
ALBUMIN: 4.2 g/dL (ref 3.5–5.2)
ALK PHOS: 38 U/L — AB (ref 39–117)
ALT: 16 U/L (ref 0–53)
AST: 21 U/L (ref 0–37)
BUN: 10 mg/dL (ref 6–23)
CALCIUM: 9.1 mg/dL (ref 8.4–10.5)
CO2: 28 mEq/L (ref 19–32)
Chloride: 102 mEq/L (ref 96–112)
Creatinine, Ser: 1 mg/dL (ref 0.4–1.5)
GFR: 98.43 mL/min (ref >60.00)
Glucose, Bld: 152 mg/dL — ABNORMAL HIGH (ref 70–99)
Potassium: 4 mEq/L (ref 3.5–5.1)
SODIUM: 137 meq/L (ref 135–145)
TOTAL PROTEIN: 6.8 g/dL (ref 6.0–8.3)
Total Bilirubin: 0.7 mg/dL (ref 0.2–1.2)

## 2014-02-20 LAB — MICROALBUMIN / CREATININE URINE RATIO
CREATININE, U: 47.3 mg/dL
MICROALB UR: 0.2 mg/dL (ref 0.0–1.9)
Microalb Creat Ratio: 0.4 mg/g (ref 0.0–30.0)

## 2014-02-20 LAB — LIPID PANEL
Cholesterol: 174 mg/dL (ref 0–200)
HDL: 42.2 mg/dL (ref >39.00)
LDL Cholesterol: 122 mg/dL — ABNORMAL HIGH (ref 0–99)
Total CHOL/HDL Ratio: 4
Triglycerides: 49 mg/dL (ref 0.0–149.0)
VLDL: 9.8 mg/dL (ref 0.0–40.0)

## 2014-02-20 LAB — HEMOGLOBIN A1C: HEMOGLOBIN A1C: 8.2 % — AB (ref 4.6–6.5)

## 2014-02-23 ENCOUNTER — Ambulatory Visit: Payer: Medicare HMO | Admitting: Endocrinology

## 2014-03-02 ENCOUNTER — Ambulatory Visit (INDEPENDENT_AMBULATORY_CARE_PROVIDER_SITE_OTHER): Payer: Commercial Managed Care - HMO | Admitting: Endocrinology

## 2014-03-02 ENCOUNTER — Encounter: Payer: Self-pay | Admitting: Endocrinology

## 2014-03-02 VITALS — BP 118/70 | HR 75 | Temp 97.8°F | Resp 14 | Ht 69.0 in | Wt 200.0 lb

## 2014-03-02 DIAGNOSIS — E785 Hyperlipidemia, unspecified: Secondary | ICD-10-CM

## 2014-03-02 DIAGNOSIS — E1165 Type 2 diabetes mellitus with hyperglycemia: Principal | ICD-10-CM

## 2014-03-02 DIAGNOSIS — E669 Obesity, unspecified: Secondary | ICD-10-CM | POA: Diagnosis not present

## 2014-03-02 DIAGNOSIS — IMO0001 Reserved for inherently not codable concepts without codable children: Secondary | ICD-10-CM | POA: Diagnosis not present

## 2014-03-02 MED ORDER — VICTOZA 18 MG/3ML ~~LOC~~ SOPN: 1.2000 mg | PEN_INJECTOR | Freq: Every day | SUBCUTANEOUS | Status: DC

## 2014-03-02 NOTE — Patient Instructions (Addendum)
Please check blood sugars at least half the time about 2 hours after any meal (target 160) and as 3x weekly on waking up. Please bring blood sugar monitor to each visit  Start VICTOZA injection with the sample pen once daily at the same time of the day.  Dial the dose to 0.6 mg for the first week.  You may  experience nausea in the first few days which usually gets better the After 1 week increase the dose to 1.2mg  daily if no nausea.  You may inject in the stomach, thigh or arm.   You will feel fullness of the stomach with starting the medication and should try to keep portions of food small.    Stop Januvia  And stop PM dose of Glimeperide

## 2014-03-02 NOTE — Progress Notes (Signed)
Patient ID: Ryan Gilmore, male   DOB: Jun 09, 1933, 80 y.o.   MRN: [id number]   Reason for Appointment: Diabetes follow-up   History of Present Illness   Diagnosis: Type 2 DIABETES MELITUS, date of diagnosis: ? 2005     Previous history: He has previously been on metformin, Amaryl and Januvia for diabetes management but has shown progressive increase in his blood sugars over the last few years requiring escalation of treatment. Also has had difficulty with compliance with diet in the past but had done better with dietary education. Because of inadequate control Actos was added in 01/2013 and was increased to 30 mg in 6/14 Although his A1c had come down to 7% initially In 9/14 his A1c had increased to 7.9  Recent history:  His A1c is relatively higher again and still over 8% Because of higher A1c he was offered Invokana in addition to his regimen of 4 drugs.  However this and probably Wilder Glade were  not covered by his insurance and he could not use these drugs He has been consistently refusing to consider an injectable drug for better efficacy He promises that he will try to do better with diet and exercise but has not done well at all with this and has not lost any significant amount of weight either Currently on a  regimen of Januvia, Amaryl, metformin and Actos Recently his fasting blood sugars are overall higher, previously were averaging 125 Again despite reminders he is hardly ever checking his readings after lunch or dinner Most of his readings at night or close to or more than 200    Oral hypoglycemic drugs: Actos 30 mg, Amaryl, metformin and Januvia        Side effects from medications: None       Monitors blood glucose: Once a day.    Glucometer: One Touch.          Blood Glucose readings from meter download:   PREMEAL Breakfast Lunch Dinner Bedtime Overall  Glucose range:  107-218   96-233  ?   198-234    Median:  150   160    216   154    Hypoglycemia frequency:  none         Meals: 2 meals per day: 9 AM,  2 PM. Oatmeal with fruit in am; Has some snacks in the evening occasionally fried food sweets          Physical activity: exercise: Occasional walking recently           Dietician visit: Most recent: 11/2012          Wt Readings from Last 3 Encounters:  03/02/14 200 lb (90.719 kg)  12/21/13 201 lb (91.173 kg)  11/03/13 199 lb 12.8 oz (90.629 kg)    LABS:  Lab Results  Component Value Date   HGBA1C 8.2* 02/20/2014   HGBA1C 8.0* 10/31/2013   HGBA1C 7.9* 06/28/2013   Lab Results  Component Value Date   MICROALBUR 0.2 02/20/2014   LDLCALC 122* 02/20/2014   CREATININE 1.0 02/20/2014     No visits with results within 1 Week(s) from this visit. Latest known visit with results is:  Appointment on 02/20/2014  Component Date Value Ref Range Status  . Hemoglobin A1C 02/20/2014 8.2* 4.6 - 6.5 % Final   Glycemic Control Guidelines for People with Diabetes:Non Diabetic:  <6%Goal of Therapy: <7%Additional Action Suggested:  >8%   . Cholesterol 02/20/2014 174  0 - 200 mg/dL Final   ATP III  Classification       Desirable:  < 200 mg/dL               Borderline High:  200 - 239 mg/dL          High:  > = 240 mg/dL  . Triglycerides 02/20/2014 49.0  0.0 - 149.0 mg/dL Final   Normal:  <150 mg/dLBorderline High:  150 - 199 mg/dL  . HDL 02/20/2014 42.20  >39.00 mg/dL Final  . VLDL 02/20/2014 9.8  0.0 - 40.0 mg/dL Final  . LDL Cholesterol 02/20/2014 122* 0 - 99 mg/dL Final  . Total CHOL/HDL Ratio 02/20/2014 4   Final                  Men          Women1/2 Average Risk     3.4          3.3Average Risk          5.0          4.42X Average Risk          9.6          7.13X Average Risk          15.0          11.0                      . Microalb, Ur 02/20/2014 0.2  0.0 - 1.9 mg/dL Final  . Creatinine,U 02/20/2014 47.3   Final  . Microalb Creat Ratio 02/20/2014 0.4  0.0 - 30.0 mg/g Final  . Sodium 02/20/2014 137  135 - 145 mEq/L Final  . Potassium 02/20/2014 4.0  3.5 - 5.1  mEq/L Final  . Chloride 02/20/2014 102  96 - 112 mEq/L Final  . CO2 02/20/2014 28  19 - 32 mEq/L Final  . Glucose, Bld 02/20/2014 152* 70 - 99 mg/dL Final  . BUN 02/20/2014 10  6 - 23 mg/dL Final  . Creatinine, Ser 02/20/2014 1.0  0.4 - 1.5 mg/dL Final  . Total Bilirubin 02/20/2014 0.7  0.2 - 1.2 mg/dL Final  . Alkaline Phosphatase 02/20/2014 38* 39 - 117 U/L Final  . AST 02/20/2014 21  0 - 37 U/L Final  . ALT 02/20/2014 16  0 - 53 U/L Final  . Total Protein 02/20/2014 6.8  6.0 - 8.3 g/dL Final  . Albumin 02/20/2014 4.2  3.5 - 5.2 g/dL Final  . Calcium 02/20/2014 9.1  8.4 - 10.5 mg/dL Final  . GFR 02/20/2014 98.43  >60.00 mL/min Final      Medication List       This list is accurate as of: 03/02/14  8:18 AM.  Always use your most recent med list.               glimepiride 4 MG tablet  Commonly known as:  AMARYL  Take 4 mg by mouth. 1/2 twice daily     glucose blood test strip  Commonly known as:  ONE TOUCH ULTRA TEST  Use to check blood sugar two times per day dx code 250.02     JANUVIA 100 MG tablet  Generic drug:  sitaGLIPtin  Take 100 mg by mouth daily.     metFORMIN 500 MG tablet  Commonly known as:  GLUCOPHAGE  Take 1,000 mg by mouth 2 (two) times daily with a meal.     pioglitazone 30 MG tablet  Commonly known as:  ACTOS  Take 1  tablet (30 mg total) by mouth daily.     rosuvastatin 40 MG tablet  Commonly known as:  CRESTOR  Take 40 mg by mouth daily.        Allergies: No Known Allergies  No past medical history on file.  Past Surgical History  Procedure Laterality Date  . Appendectomy      Family History  Problem Relation Age of Onset  . Heart disease Father   . Diabetes Brother     Social History:  reports that he has never smoked. His smokeless tobacco use includes Chew. His alcohol and drug histories are not on file.  Review of Systems:  Hypertension:  he had been on Avapro for this and now blood pressure is consistently normal without  any medication  Lipids: Has previously had been taking Lipitor and previously LDL was 77, changed to Crestor by PCP:  Out of Crestor and cannot afford the prescription; refuses to take Lipitor because of fear of memory problems  Lab Results  Component Value Date   CHOL 174 02/20/2014   HDL 42.20 02/20/2014   LDLCALC 122* 02/20/2014   TRIG 49.0 02/20/2014   CHOLHDL 4 02/20/2014     Examination:   BP 118/70  Pulse 75  Temp(Src) 97.8 F (36.6 C)  Resp 14  Ht 5\' 9"  (1.753 m)  Wt 200 lb (90.719 kg)  BMI 29.52 kg/m2  SpO2 95%  Body mass index is 29.52 kg/(m^2).   No ankle edema present  ASSESSMENT/ PLAN:  Diabetes type 2:  Blood glucose control is still poorly controlled with high A1c of over 8% Also his home readings appear to be relatively higher than the last time Although he is a good candidate for Invokana on Farxiga these are apparently not covered by insurance He is again very reluctant to consider another drug including Victoza Were discussed with him that because of his progression of his diabetes and lack of weight loss this would be an ideal treatment.  Discussed with the patient the nature of GLP-1 drugs, the action on various organ systems, how they benefit blood glucose control, as well as the benefit of weight loss and  increase satiety . Explained possible side effects especially nausea and vomiting; discussed safety information in package insert. Described injection technique and dosage titration of Victoza  starting with 0.6 mg once a day at the same time for the first week and then increasing to 1.2 mg if no symptoms of nausea. Patient brochure on Victoza and co-pay card given  Currently does not have inconsistent diet and has difficulty being compliant Also has not exercised regularly Frequently has high postprandial readings but he is not monitoring much after the evening meal and this was discussed today  Recommendations:   Start Victoza, discussed dosage  titration  Demonstrated how to use the Victoza pen  However he still wants to think about it and will have him bring the prescription to the nurse educator for further discussion and to try the first injection in the office  With starting Victoza will reduce his Advair to once a day in the morning only  More readings after meals  Regular exercise  Given discussed using pravastatin with his PCP  Counseling time over 50% of today's 25 minute visit  Elayne Snare 03/02/2014, 8:18 AM

## 2014-03-06 ENCOUNTER — Encounter: Payer: Medicare HMO | Attending: Endocrinology | Admitting: Nutrition

## 2014-03-06 DIAGNOSIS — Z713 Dietary counseling and surveillance: Secondary | ICD-10-CM | POA: Insufficient documentation

## 2014-03-06 DIAGNOSIS — E1165 Type 2 diabetes mellitus with hyperglycemia: Principal | ICD-10-CM

## 2014-03-06 DIAGNOSIS — IMO0001 Reserved for inherently not codable concepts without codable children: Secondary | ICD-10-CM

## 2014-03-07 ENCOUNTER — Encounter: Payer: Commercial Managed Care - HMO | Admitting: Nutrition

## 2014-03-07 NOTE — Patient Instructions (Signed)
1.  Inject Victoza once a day. 2. Give 0.6 for 7 days, and increase the dose to 1.2 after 7 days if no nausea.   3.  Test blood sugars before and 2hr. After one meal each day.

## 2014-03-07 NOTE — Progress Notes (Signed)
This patient is here with wife and daughter to learn how to inject Victoza.   We discussed how to this medication works, side effects and how to increase the dose.  They reported good understanding of these topics and had no final questions.    He had brought his medication from the pharmacy, and was instructed on how to use the pen, and how to inject.  He did this without any difficulty and reported good understanding of this.  We reviewed his dosage adjustment, and he reported good understanding.  They had no final quesitons.

## 2014-03-27 ENCOUNTER — Other Ambulatory Visit: Payer: Self-pay | Admitting: Endocrinology

## 2014-03-30 ENCOUNTER — Encounter: Payer: Self-pay | Admitting: Endocrinology

## 2014-03-30 ENCOUNTER — Ambulatory Visit (INDEPENDENT_AMBULATORY_CARE_PROVIDER_SITE_OTHER): Payer: Commercial Managed Care - HMO | Admitting: Endocrinology

## 2014-03-30 VITALS — BP 126/74 | HR 89 | Temp 98.3°F | Resp 16 | Ht 69.0 in | Wt 196.2 lb

## 2014-03-30 DIAGNOSIS — IMO0001 Reserved for inherently not codable concepts without codable children: Secondary | ICD-10-CM | POA: Diagnosis not present

## 2014-03-30 DIAGNOSIS — E1165 Type 2 diabetes mellitus with hyperglycemia: Principal | ICD-10-CM

## 2014-03-30 NOTE — Patient Instructions (Addendum)
Please check blood sugars at least half the time about 2 hours after any meal and times per week on waking up. Please bring blood sugar monitor to each visit  Walk daily

## 2014-03-30 NOTE — Progress Notes (Signed)
Patient ID: Ryan Gilmore, male   DOB: 05-05-1933, 80 y.o.   MRN: [id number]   Reason for Appointment: Diabetes follow-up   History of Present Illness   Diagnosis: Type 2 DIABETES MELITUS, date of diagnosis: ? 2005     Previous history: He has previously been on metformin, Amaryl and Januvia for diabetes management but has shown progressive increase in his blood sugars over the last few years requiring escalation of treatment. Also has had difficulty with compliance with diet in the past but had done better with dietary education. Because of inadequate control Actos was added in 01/2013 and was increased to 30 mg in 6/14 Although his A1c had come down to 7% initially In 9/14 his A1c had increased to 7.9  Recent history:  Because of higher A1c and persistently high readings he was started on Victoza instead of Januvia in 5/15 He had previously been persistently reluctant to start an injectable drug With this his blood sugars have improved significantly and he thinks he has more energy also He is taking the injection in the morning and has no difficulty doing this. Also has no side effects like nausea or GI problems Amaryl was reduced to once a day He has done only a few readings after meals and it may be sporadically higher possibly from inconsistent diet However overall his portion control is better with starting Victoza    Oral hypoglycemic drugs: Actos 30 mg, Amaryl, metformin       Side effects from medications: None       Monitors blood glucose: Once a day.    Glucometer: One Touch.          Blood Glucose readings from meter download:   Fasting range 97-268 with median 123 and nonfasting 69-268 with overall median 123  Hypoglycemia frequency:  none        Meals: 2 meals per day: 9 AM,  2 PM. Oatmeal with fruit in am; Has some snacks in the evening occasionally fried food sweets          Physical activity: exercise: Occasional walking recently           Dietician visit: Most recent:  11/2012          Wt Readings from Last 3 Encounters:  03/30/14 196 lb 3.2 oz (88.996 kg)  03/02/14 200 lb (90.719 kg)  12/21/13 201 lb (91.173 kg)    LABS:  Lab Results  Component Value Date   HGBA1C 8.2* 02/20/2014   HGBA1C 8.0* 10/31/2013   HGBA1C 7.9* 06/28/2013   Lab Results  Component Value Date   MICROALBUR 0.2 02/20/2014   LDLCALC 122* 02/20/2014   CREATININE 1.0 02/20/2014     No visits with results within 1 Week(s) from this visit. Latest known visit with results is:  Appointment on 02/20/2014  Component Date Value Ref Range Status  . Hemoglobin A1C 02/20/2014 8.2* 4.6 - 6.5 % Final   Glycemic Control Guidelines for People with Diabetes:Non Diabetic:  <6%Goal of Therapy: <7%Additional Action Suggested:  >8%   . Cholesterol 02/20/2014 174  0 - 200 mg/dL Final   ATP III Classification       Desirable:  < 200 mg/dL               Borderline High:  200 - 239 mg/dL          High:  > = 240 mg/dL  . Triglycerides 02/20/2014 49.0  0.0 - 149.0 mg/dL Final   Normal:  <  150 mg/dLBorderline High:  150 - 199 mg/dL  . HDL 02/20/2014 42.20  >39.00 mg/dL Final  . VLDL 02/20/2014 9.8  0.0 - 40.0 mg/dL Final  . LDL Cholesterol 02/20/2014 122* 0 - 99 mg/dL Final  . Total CHOL/HDL Ratio 02/20/2014 4   Final                  Men          Women1/2 Average Risk     3.4          3.3Average Risk          5.0          4.42X Average Risk          9.6          7.13X Average Risk          15.0          11.0                      . Microalb, Ur 02/20/2014 0.2  0.0 - 1.9 mg/dL Final  . Creatinine,U 02/20/2014 47.3   Final  . Microalb Creat Ratio 02/20/2014 0.4  0.0 - 30.0 mg/g Final  . Sodium 02/20/2014 137  135 - 145 mEq/L Final  . Potassium 02/20/2014 4.0  3.5 - 5.1 mEq/L Final  . Chloride 02/20/2014 102  96 - 112 mEq/L Final  . CO2 02/20/2014 28  19 - 32 mEq/L Final  . Glucose, Bld 02/20/2014 152* 70 - 99 mg/dL Final  . BUN 02/20/2014 10  6 - 23 mg/dL Final  . Creatinine, Ser 02/20/2014 1.0  0.4  - 1.5 mg/dL Final  . Total Bilirubin 02/20/2014 0.7  0.2 - 1.2 mg/dL Final  . Alkaline Phosphatase 02/20/2014 38* 39 - 117 U/L Final  . AST 02/20/2014 21  0 - 37 U/L Final  . ALT 02/20/2014 16  0 - 53 U/L Final  . Total Protein 02/20/2014 6.8  6.0 - 8.3 g/dL Final  . Albumin 02/20/2014 4.2  3.5 - 5.2 g/dL Final  . Calcium 02/20/2014 9.1  8.4 - 10.5 mg/dL Final  . GFR 02/20/2014 98.43  >60.00 mL/min Final      Medication List       This list is accurate as of: 03/30/14  9:06 AM.  Always use your most recent med list.               glimepiride 4 MG tablet  Commonly known as:  AMARYL  Take 4 mg by mouth. 1 tablet daily     glucose blood test strip  Commonly known as:  ONE TOUCH ULTRA TEST  Use to check blood sugar two times per day dx code 250.02     JANUVIA 100 MG tablet  Generic drug:  sitaGLIPtin  Take 100 mg by mouth daily.     metFORMIN 500 MG tablet  Commonly known as:  GLUCOPHAGE  Take 1,000 mg by mouth 2 (two) times daily with a meal.     pioglitazone 30 MG tablet  Commonly known as:  ACTOS  TAKE ONE TABLET BY MOUTH ONCE DAILY     rosuvastatin 40 MG tablet  Commonly known as:  CRESTOR  Take 40 mg by mouth daily.     VICTOZA 18 MG/3ML Sopn  Generic drug:  Liraglutide  Inject 1.2 mg into the skin daily. Inject once daily at the same time        Allergies: No  Known Allergies  No past medical history on file.  Past Surgical History  Procedure Laterality Date  . Appendectomy      Family History  Problem Relation Age of Onset  . Heart disease Father   . Diabetes Brother     Social History:  reports that he has never smoked. His smokeless tobacco use includes Chew. His alcohol and drug histories are not on file.  Review of Systems:  Hypertension:  he had been on Avapro for this and now blood pressure is consistently normal without any medication  Lipids: Has previously had been taking Lipitor and previously LDL was 77, changed to Crestor by PCP:   Out of Crestor and cannot afford the prescription; refuses to take Lipitor because of fear of memory problems He was asked to discuss pravastatin with his PCP  Lab Results  Component Value Date   CHOL 174 02/20/2014   HDL 42.20 02/20/2014   LDLCALC 122* 02/20/2014   TRIG 49.0 02/20/2014   CHOLHDL 4 02/20/2014     Examination:   BP 126/74  Pulse 89  Temp(Src) 98.3 F (36.8 C)  Resp 16  Ht 5\' 9"  (1.753 m)  Wt 196 lb 3.2 oz (88.996 kg)  BMI 28.96 kg/m2  SpO2 95%  Body mass index is 28.96 kg/(m^2).     ASSESSMENT/ PLAN:  Diabetes type 2:  Blood glucose control is significantly better with using Victoza instead of Januvia As discussed in history of present illness his blood sugars are excellent in the mornings Again he is not checking enough readings after meals and discussed rotating the time of testing Also encouraged him to increase his exercise and be consistent with diet He will come back in 2 months for A1c   KUMAR,AJAY 03/30/2014, 9:06 AM

## 2014-04-27 ENCOUNTER — Telehealth: Payer: Self-pay | Admitting: Endocrinology

## 2014-04-27 ENCOUNTER — Other Ambulatory Visit: Payer: Self-pay | Admitting: *Deleted

## 2014-04-27 MED ORDER — VICTOZA 18 MG/3ML ~~LOC~~ SOPN: 1.2000 mg | PEN_INJECTOR | Freq: Every day | SUBCUTANEOUS | Status: DC

## 2014-04-27 NOTE — Telephone Encounter (Signed)
rx sent, patient aware 

## 2014-04-27 NOTE — Telephone Encounter (Signed)
Patient need a prescription refill of Victoza  18 mg, he need within the next ten days. Thank you

## 2014-05-26 ENCOUNTER — Other Ambulatory Visit (INDEPENDENT_AMBULATORY_CARE_PROVIDER_SITE_OTHER): Payer: Commercial Managed Care - HMO

## 2014-05-26 DIAGNOSIS — E1165 Type 2 diabetes mellitus with hyperglycemia: Principal | ICD-10-CM

## 2014-05-26 DIAGNOSIS — IMO0001 Reserved for inherently not codable concepts without codable children: Secondary | ICD-10-CM

## 2014-05-27 LAB — COMPREHENSIVE METABOLIC PANEL
ALT: 14 U/L (ref 0–53)
AST: 17 U/L (ref 0–37)
Albumin: 4.1 g/dL (ref 3.5–5.2)
Alkaline Phosphatase: 38 U/L — ABNORMAL LOW (ref 39–117)
BILIRUBIN TOTAL: 0.5 mg/dL (ref 0.2–1.2)
BUN: 10 mg/dL (ref 6–23)
CO2: 27 mEq/L (ref 19–32)
CREATININE: 1 mg/dL (ref 0.4–1.5)
Calcium: 9.2 mg/dL (ref 8.4–10.5)
Chloride: 102 mEq/L (ref 96–112)
GFR: 98.36 mL/min (ref >60.00)
Glucose, Bld: 120 mg/dL — ABNORMAL HIGH (ref 70–99)
Potassium: 4.1 mEq/L (ref 3.5–5.1)
SODIUM: 137 meq/L (ref 135–145)
TOTAL PROTEIN: 6.5 g/dL (ref 6.0–8.3)

## 2014-05-27 LAB — HEMOGLOBIN A1C: Hgb A1c MFr Bld: 7.5 % — ABNORMAL HIGH (ref 4.6–6.5)

## 2014-05-29 ENCOUNTER — Other Ambulatory Visit: Payer: Commercial Managed Care - HMO

## 2014-06-01 ENCOUNTER — Ambulatory Visit (INDEPENDENT_AMBULATORY_CARE_PROVIDER_SITE_OTHER): Payer: Commercial Managed Care - HMO | Admitting: Endocrinology

## 2014-06-01 ENCOUNTER — Encounter: Payer: Self-pay | Admitting: Endocrinology

## 2014-06-01 VITALS — BP 126/78 | HR 75 | Temp 98.2°F | Resp 16 | Ht 69.0 in | Wt 192.2 lb

## 2014-06-01 DIAGNOSIS — IMO0001 Reserved for inherently not codable concepts without codable children: Secondary | ICD-10-CM

## 2014-06-01 DIAGNOSIS — E1165 Type 2 diabetes mellitus with hyperglycemia: Principal | ICD-10-CM

## 2014-06-01 NOTE — Progress Notes (Signed)
Patient ID: Ryan Gilmore, male   DOB: [birth year], 80 y.o.   MRN: [id number]   Reason for Appointment: Diabetes follow-up   History of Present Illness   Diagnosis: Type 2 DIABETES MELITUS, date of diagnosis: ? 2005     Previous history: He has previously been on metformin, Amaryl and Januvia for diabetes management but has shown progressive increase in his blood sugars over the last few years requiring escalation of treatment. Also has had difficulty with compliance with diet in the past but had done better with dietary education. Because of inadequate control Actos was added in 01/2013 and was increased to 30 mg in 6/14 Although his A1c had come down to 7% initially In 9/14 his A1c had increased to 7.9  Recent history:  Because of higher A1c and persistently high readings he was started on Victoza instead of Januvia in 5/15 He had previously been persistently reluctant to start an injectable drug With this his blood sugars improved and he has lost a total of 8 pounds now He is taking the injection in the morning and has no nausea However he is concerned about the cost of the medication He has again done only a few readings after meals and mostly in the morning His fasting readings are mildly increased but as low as 102 He is sometimes not watching his diet and blood sugars may be as high as 165 in the morning Also not always getting much protein at breakfast However overall his portion control is better with starting Victoza    Oral hypoglycemic drugs: Actos 30 mg, Amaryl 1/2 in am, metformin       Side effects from medications: None       Monitors blood glucose:  1.2 times a day.    Glucometer: One Touch.          Blood Glucose readings from meter download:   PREMEAL Breakfast Lunch Dinner Bedtime Overall  Glucose range: 102-165  131-146   105, 152   128-167    Mean/median:  135    131   Hypoglycemia frequency:  none        Meals: 2 meals per day: 9 AM,  2 PM. Oatmeal, walnuts with  fruit in am; Has some snacks in the evening occasionally fried food sweets          Physical activity: exercise: Occasional walking recently           Dietician visit: Most recent: 11/2012          Wt Readings from Last 3 Encounters:  06/01/14 192 lb 3.2 oz (87.181 kg)  03/30/14 196 lb 3.2 oz (88.996 kg)  03/02/14 200 lb (90.719 kg)    LABS:  Lab Results  Component Value Date   HGBA1C 7.5* 05/26/2014   HGBA1C 8.2* 02/20/2014   HGBA1C 8.0* 10/31/2013   Lab Results  Component Value Date   MICROALBUR 0.2 02/20/2014   LDLCALC 122* 02/20/2014   CREATININE 1.0 05/26/2014     Appointment on 05/26/2014  Component Date Value Ref Range Status  . Hemoglobin A1C 05/26/2014 7.5* 4.6 - 6.5 % Final   Glycemic Control Guidelines for People with Diabetes:Non Diabetic:  <6%Goal of Therapy: <7%Additional Action Suggested:  >8%   . Sodium 05/26/2014 137  135 - 145 mEq/L Final  . Potassium 05/26/2014 4.1  3.5 - 5.1 mEq/L Final  . Chloride 05/26/2014 102  96 - 112 mEq/L Final  . CO2 05/26/2014 27  19 - 32 mEq/L Final  .  Glucose, Bld 05/26/2014 120* 70 - 99 mg/dL Final  . BUN 05/26/2014 10  6 - 23 mg/dL Final  . Creatinine, Ser 05/26/2014 1.0  0.4 - 1.5 mg/dL Final  . Total Bilirubin 05/26/2014 0.5  0.2 - 1.2 mg/dL Final  . Alkaline Phosphatase 05/26/2014 38* 39 - 117 U/L Final  . AST 05/26/2014 17  0 - 37 U/L Final  . ALT 05/26/2014 14  0 - 53 U/L Final  . Total Protein 05/26/2014 6.5  6.0 - 8.3 g/dL Final  . Albumin 05/26/2014 4.1  3.5 - 5.2 g/dL Final  . Calcium 05/26/2014 9.2  8.4 - 10.5 mg/dL Final  . GFR 05/26/2014 98.36  >60.00 mL/min Final      Medication List       This list is accurate as of: 06/01/14  8:59 AM.  Always use your most recent med list.               glimepiride 4 MG tablet  Commonly known as:  AMARYL  Take 4 mg by mouth. 1 tablet daily     glucose blood test strip  Commonly known as:  ONE TOUCH ULTRA TEST  Use to check blood sugar two times per day dx code  250.02     metFORMIN 500 MG tablet  Commonly known as:  GLUCOPHAGE  Take 1,000 mg by mouth 2 (two) times daily with a meal.     pioglitazone 30 MG tablet  Commonly known as:  ACTOS  TAKE ONE TABLET BY MOUTH ONCE DAILY     rosuvastatin 40 MG tablet  Commonly known as:  CRESTOR  Take 40 mg by mouth daily.     VICTOZA 18 MG/3ML Sopn  Generic drug:  Liraglutide  Inject 1.2 mg into the skin daily. Inject once daily at the same time        Allergies: No Known Allergies  No past medical history on file.  Past Surgical History  Procedure Laterality Date  . Appendectomy      Family History  Problem Relation Age of Onset  . Heart disease Father   . Diabetes Brother     Social History:  reports that he has never smoked. His smokeless tobacco use includes Chew. His alcohol and drug histories are not on file.  Review of Systems:  Hypertension:  he had been on Avapro previously  and now blood pressure is consistently normal without any medication  Lipids: Has previously had been taking Lipitor and previously LDL was 77, changed to Crestor by PCP:  Out of Crestor and cannot afford the prescription; refuses to take Lipitor because of fear of memory problems He was asked to discuss pravastatin with his PCP  Lab Results  Component Value Date   CHOL 174 02/20/2014   HDL 42.20 02/20/2014   LDLCALC 122* 02/20/2014   TRIG 49.0 02/20/2014   CHOLHDL 4 02/20/2014     Examination:   BP 126/78  Pulse 75  Temp(Src) 98.2 F (36.8 C)  Resp 16  Ht 5\' 9"  (1.753 m)  Wt 192 lb 3.2 oz (87.181 kg)  BMI 28.37 kg/m2  SpO2 93%  Body mass index is 28.37 kg/(m^2).     ASSESSMENT/ PLAN:  Diabetes type 2:  Blood glucose control is  fairly good and A1c is improving although higher than expected at 7.5 Most of his blood sugars are relatively good at home and less variable Again he is not checking enough readings after meals and discussed  more readings after  any of the meals Also reminded  him to add more protein to breakfast and reduce carbohydrate  Have given him the assistance form for Victoza from the [facility] 06/01/2014, 8:59 AM

## 2014-06-01 NOTE — Patient Instructions (Signed)
Please check blood sugars at least half the time about 2 hours after any meal and 3 times per week on waking up.  Please bring blood sugar monitor to each visit    

## 2014-06-16 ENCOUNTER — Telehealth: Payer: Self-pay | Admitting: Endocrinology

## 2014-06-16 NOTE — Telephone Encounter (Signed)
Patient wife would like for you to call her concerning patient Victoza 18 mg/3 ml [phone]

## 2014-08-28 ENCOUNTER — Ambulatory Visit (INDEPENDENT_AMBULATORY_CARE_PROVIDER_SITE_OTHER): Payer: Commercial Managed Care - HMO | Admitting: Endocrinology

## 2014-08-28 ENCOUNTER — Encounter: Payer: Self-pay | Admitting: Endocrinology

## 2014-08-28 VITALS — BP 141/84 | HR 79 | Temp 98.0°F | Resp 16 | Ht 69.0 in | Wt 197.0 lb

## 2014-08-28 DIAGNOSIS — E1165 Type 2 diabetes mellitus with hyperglycemia: Secondary | ICD-10-CM

## 2014-08-28 DIAGNOSIS — E785 Hyperlipidemia, unspecified: Secondary | ICD-10-CM

## 2014-08-28 DIAGNOSIS — IMO0002 Reserved for concepts with insufficient information to code with codable children: Secondary | ICD-10-CM

## 2014-08-28 LAB — MICROALBUMIN / CREATININE URINE RATIO
Creatinine,U: 47.2 mg/dL
MICROALB/CREAT RATIO: 0.6 mg/g (ref 0.0–30.0)
Microalb, Ur: 0.3 mg/dL (ref 0.0–1.9)

## 2014-08-28 LAB — BASIC METABOLIC PANEL
BUN: 10 mg/dL (ref 6–23)
CO2: 25 mEq/L (ref 19–32)
Calcium: 9.5 mg/dL (ref 8.4–10.5)
Chloride: 102 mEq/L (ref 96–112)
Creatinine, Ser: 0.9 mg/dL (ref 0.4–1.5)
GFR: 108.8 mL/min (ref >60.00)
Glucose, Bld: 209 mg/dL — ABNORMAL HIGH (ref 70–99)
Potassium: 3.9 mEq/L (ref 3.5–5.1)
Sodium: 137 mEq/L (ref 135–145)

## 2014-08-28 LAB — LDL CHOLESTEROL, DIRECT: Direct LDL: 106.9 mg/dL

## 2014-08-28 LAB — HEMOGLOBIN A1C: HEMOGLOBIN A1C: 7.7 % — AB (ref 4.6–6.5)

## 2014-08-28 MED ORDER — GLIMEPIRIDE 1 MG PO TABS: 1.0000 mg | ORAL_TABLET | Freq: Two times a day (BID) | ORAL | Status: DC

## 2014-08-28 NOTE — Progress Notes (Signed)
Patient ID: Ryan Gilmore, male   DOB: [birth year], 80 y.o.   MRN: [id number]   Reason for Appointment: Diabetes follow-up   History of Present Illness   Diagnosis: Type 2 DIABETES MELITUS, date of diagnosis: ? 2005     Previous history: He has previously been on metformin, Amaryl and Januvia for diabetes management but has shown progressive increase in his blood sugars over the last few years requiring escalation of treatment. Also has had difficulty with compliance with diet in the past but had done better with dietary education. Because of inadequate control Actos was added in 01/2013 and was increased to 30 mg in 6/14 Although his A1c had come down to 7% initially In 9/14 his A1c had increased to 7.9 Because of higher A1c and persistently high readings he was started on Victoza instead of Januvia in 5/15  Recent history:  He had previously been persistently reluctant to start an injectable drug With Victoza his blood sugars improved and he initially lost weight but has gained back 5 pounds since his last visit He is taking the injection in the morning and has no nausea He has been able to get the injection through the company indigent program Blood sugars are looking fairly good except higher in the evenings at times when he checks them after supper.  Has done only a few readings after meals and they may be high at times Recently has also not been exercising much and has not been motivated    Oral hypoglycemic drugs: Actos 30 mg, Amaryl 1/2 in am, metformin       Side effects from medications: None       Monitors blood glucose:  1.2 times a day.    Glucometer: One Touch.          Blood Glucose readings from meter download:   PREMEAL Breakfast Lunch  6-8 PM  Bedtime Overall  Glucose range:  102-148   60-118   115-283     Median:  133    166   133    POST-MEAL PC Breakfast PC Lunch PC Dinner  Glucose range:   70-158    Mean/median:      Hypoglycemia: once around lunchtime         Meals: 2 meals per day: 9 AM,  2 PM. Oatmeal, walnuts with fruit in am; Has some snacks in the evening occasionally fried food and  sweets          Physical activity: exercise: sometimes           Dietician visit: Most recent: 11/2012          Wt Readings from Last 3 Encounters:  08/28/14 197 lb (89.359 kg)  06/01/14 192 lb 3.2 oz (87.181 kg)  03/30/14 196 lb 3.2 oz (88.996 kg)    LABS:  Lab Results  Component Value Date   HGBA1C 7.5* 05/26/2014   HGBA1C 8.2* 02/20/2014   HGBA1C 8.0* 10/31/2013   Lab Results  Component Value Date   MICROALBUR 0.2 02/20/2014   LDLCALC 122* 02/20/2014   CREATININE 1.0 05/26/2014     No visits with results within 1 Week(s) from this visit. Latest known visit with results is:  Appointment on 05/26/2014  Component Date Value Ref Range Status  . Hgb A1c MFr Bld 05/26/2014 7.5* 4.6 - 6.5 % Final   Glycemic Control Guidelines for People with Diabetes:Non Diabetic:  <6%Goal of Therapy: <7%Additional Action Suggested:  >8%   . Sodium 05/26/2014 137  135 - 145 mEq/L Final  . Potassium 05/26/2014 4.1  3.5 - 5.1 mEq/L Final  . Chloride 05/26/2014 102  96 - 112 mEq/L Final  . CO2 05/26/2014 27  19 - 32 mEq/L Final  . Glucose, Bld 05/26/2014 120* 70 - 99 mg/dL Final  . BUN 05/26/2014 10  6 - 23 mg/dL Final  . Creatinine, Ser 05/26/2014 1.0  0.4 - 1.5 mg/dL Final  . Total Bilirubin 05/26/2014 0.5  0.2 - 1.2 mg/dL Final  . Alkaline Phosphatase 05/26/2014 38* 39 - 117 U/L Final  . AST 05/26/2014 17  0 - 37 U/L Final  . ALT 05/26/2014 14  0 - 53 U/L Final  . Total Protein 05/26/2014 6.5  6.0 - 8.3 g/dL Final  . Albumin 05/26/2014 4.1  3.5 - 5.2 g/dL Final  . Calcium 05/26/2014 9.2  8.4 - 10.5 mg/dL Final  . GFR 05/26/2014 98.36  >60.00 mL/min Final      Medication List       This list is accurate as of: 08/28/14  8:47 AM.  Always use your most recent med list.               donepezil 5 MG tablet  Commonly known as:  ARICEPT      glimepiride 4 MG tablet  Commonly known as:  AMARYL  Take 4 mg by mouth. 1 tablet daily     glucose blood test strip  Commonly known as:  ONE TOUCH ULTRA TEST  Use to check blood sugar two times per day dx code 250.02     metFORMIN 500 MG tablet  Commonly known as:  GLUCOPHAGE  Take 1,000 mg by mouth 2 (two) times daily with a meal.     pioglitazone 30 MG tablet  Commonly known as:  ACTOS  TAKE ONE TABLET BY MOUTH ONCE DAILY     rosuvastatin 40 MG tablet  Commonly known as:  CRESTOR  Take 40 mg by mouth daily. Takes 1/2 tablet daily     VICTOZA 18 MG/3ML Sopn  Generic drug:  Liraglutide  Inject 1.2 mg into the skin daily. Inject once daily at the same time        Allergies: No Known Allergies  No past medical history on file.  Past Surgical History  Procedure Laterality Date  . Appendectomy      Family History  Problem Relation Age of Onset  . Heart disease Father   . Diabetes Brother     Social History:  reports that he has never smoked. His smokeless tobacco use includes Chew. His alcohol and drug histories are not on file.  Review of Systems:  Hypertension:  he had been on Avapro previously  and now blood pressure is fairly good without any medication, also followed by PCP  Lipids: Has previously had been taking Lipitor and previously LDL was 77, changed to Crestor by PCP:  However has had difficulty affording this  Lab Results  Component Value Date   CHOL 174 02/20/2014   HDL 42.20 02/20/2014   LDLCALC 122* 02/20/2014   TRIG 49.0 02/20/2014   CHOLHDL 4 02/20/2014     Examination:   BP 141/84 mmHg  Pulse 79  Temp(Src) 98 F (36.7 C)  Resp 16  Ht 5\' 9"  (1.753 m)  Wt 197 lb (89.359 kg)  BMI 29.08 kg/m2  SpO2 95%  Body mass index is 29.08 kg/(m^2).     ASSESSMENT/ PLAN:  Diabetes type 2:  Blood glucose control is  fairly good although has some high readings after meals Also fasting readings are relatively higher A1c needs to be checked  today Also discussed needing to do better with his home glucose monitoring after meals instead of mostly in the morning He will try to join the [employer] program for exercise Again needs to be consistent with his meals with balanced intake and limited amount of high-calorie foods and carbohydrates  Since he has tendency to lower readings in the afternoons and higher readings after dinner and overnight will change his 2 mg Amaryl in the morning to 1 mg twice a day  Hyperlipidemia: Needs follow-up   Sapphire Tygart 08/28/2014, 8:47 AM

## 2014-08-28 NOTE — Patient Instructions (Signed)
Please check blood sugars at least half the time about 2 hours after any meal and times per week on waking up. Please bring blood sugar monitor to each visit  Walk daily  Next Rx for Amaryl 1mg , 2x daily

## 2014-09-04 ENCOUNTER — Telehealth: Payer: Self-pay | Admitting: Endocrinology

## 2014-09-04 NOTE — Telephone Encounter (Signed)
[name] wife called stating she was returning Rhonda's call    Please advise    Thank you

## 2014-09-05 ENCOUNTER — Telehealth: Payer: Self-pay | Admitting: *Deleted

## 2014-09-05 NOTE — Telephone Encounter (Signed)
Patient wife ask if you would return her call.

## 2014-09-05 NOTE — Telephone Encounter (Signed)
[name] wife called about her husbands cholesterol, she said he's not taking anything for it, and can't tolerate Atorvastatin.

## 2014-09-05 NOTE — Telephone Encounter (Signed)
What side effects does he have from Lipitor.  He can try pravastatin 40 mg daily

## 2014-09-06 ENCOUNTER — Other Ambulatory Visit: Payer: Self-pay | Admitting: *Deleted

## 2014-09-06 MED ORDER — PRAVASTATIN SODIUM 40 MG PO TABS: 40.0000 mg | ORAL_TABLET | Freq: Every day | ORAL | Status: DC

## 2014-09-06 NOTE — Telephone Encounter (Signed)
rx sent

## 2014-10-01 ENCOUNTER — Other Ambulatory Visit: Payer: Self-pay | Admitting: Endocrinology

## 2014-11-03 DIAGNOSIS — M25511 Pain in right shoulder: Secondary | ICD-10-CM | POA: Diagnosis not present

## 2014-11-23 ENCOUNTER — Other Ambulatory Visit (INDEPENDENT_AMBULATORY_CARE_PROVIDER_SITE_OTHER): Payer: Commercial Managed Care - HMO

## 2014-11-23 DIAGNOSIS — IMO0002 Reserved for concepts with insufficient information to code with codable children: Secondary | ICD-10-CM

## 2014-11-23 DIAGNOSIS — E1165 Type 2 diabetes mellitus with hyperglycemia: Secondary | ICD-10-CM

## 2014-11-23 LAB — COMPREHENSIVE METABOLIC PANEL
ALK PHOS: 43 U/L (ref 39–117)
ALT: 14 U/L (ref 0–53)
AST: 17 U/L (ref 0–37)
Albumin: 4.3 g/dL (ref 3.5–5.2)
BILIRUBIN TOTAL: 0.5 mg/dL (ref 0.2–1.2)
BUN: 9 mg/dL (ref 6–23)
CO2: 30 meq/L (ref 19–32)
CREATININE: 0.94 mg/dL (ref 0.40–1.50)
Calcium: 9.5 mg/dL (ref 8.4–10.5)
Chloride: 104 mEq/L (ref 96–112)
GFR: 99.44 mL/min (ref >60.00)
Glucose, Bld: 146 mg/dL — ABNORMAL HIGH (ref 70–99)
Potassium: 4.5 mEq/L (ref 3.5–5.1)
SODIUM: 137 meq/L (ref 135–145)
TOTAL PROTEIN: 6.9 g/dL (ref 6.0–8.3)

## 2014-11-23 LAB — HEMOGLOBIN A1C: Hgb A1c MFr Bld: 7.8 % — ABNORMAL HIGH (ref 4.6–6.5)

## 2014-11-28 ENCOUNTER — Encounter: Payer: Self-pay | Admitting: Endocrinology

## 2014-11-28 ENCOUNTER — Ambulatory Visit (INDEPENDENT_AMBULATORY_CARE_PROVIDER_SITE_OTHER): Payer: Commercial Managed Care - HMO | Admitting: Endocrinology

## 2014-11-28 VITALS — BP 138/80 | HR 69 | Temp 97.9°F | Wt 202.0 lb

## 2014-11-28 DIAGNOSIS — E785 Hyperlipidemia, unspecified: Secondary | ICD-10-CM | POA: Diagnosis not present

## 2014-11-28 DIAGNOSIS — E1165 Type 2 diabetes mellitus with hyperglycemia: Secondary | ICD-10-CM

## 2014-11-28 DIAGNOSIS — IMO0002 Reserved for concepts with insufficient information to code with codable children: Secondary | ICD-10-CM

## 2014-11-28 MED ORDER — VICTOZA 18 MG/3ML ~~LOC~~ SOPN
1.8000 mg | PEN_INJECTOR | Freq: Every day | SUBCUTANEOUS | Status: DC
Start: 2014-11-28 — End: 2015-06-12

## 2014-11-28 NOTE — Progress Notes (Signed)
Patient ID: Ryan Gilmore, male   DOB: 1933-05-26, 81 y.o.   MRN: [id number]   Reason for Appointment: Diabetes follow-up   History of Present Illness   Diagnosis: Type 2 DIABETES MELITUS, date of diagnosis: ? 2005     Previous history: He has previously been on metformin, Amaryl and Januvia for diabetes management but has shown progressive increase in his blood sugars over the last few years requiring escalation of treatment. Also has had difficulty with compliance with diet in the past but had done better with dietary education. Because of inadequate control Actos was added in 01/2013 and was increased to 30 mg in 6/14 Although his A1c had come down to 7% initially In 9/14 his A1c had increased to 7.9 Because of higher A1c and persistently high readings he was started on Victoza instead of Januvia in 5/15  Recent history:  With Victoza his blood sugars  Had improved and he initially lost weight but A1c which had come down to 7.5 is slowly higher  Also has gained back significant amount of weight which he had initially lost. He is taking the injection in the morning and has no nausea with 1.2 mg daily He has been able to get the injection through the company indigent program but his wife is concerned about the cost of the drug at this time.  Because of higher fasting readings in the morning he was told to increase his Amaryl to 1 mg twice a day and with this his fasting blood sugars are improved.    He has however not been checking many readings later in the day but most of these appear to be relatively high.   His highest reading was 239 after a steroid injection.  His A1c is still not improved and near 8% Recently has also not been exercising much and has not been motivated. He also thinks he is getting more snacks especially since he is home all day.    Oral hypoglycemic drugs: Actos 30 mg, Amaryl 1/2 bid, metformin       Side effects from medications: None       Monitors  blood glucose:  1.2 times a day.    Glucometer: One Touch.          Blood Glucose readings from meter download:   PRE-MEAL Breakfast Lunch  7-9 PM  Overall  Glucose range: 88-151  181  151- 197    Mean/median: 112    118   Hypoglycemia:  none       Meals: 2 meals per day: 9 AM,  2 PM. Oatmeal, walnuts with fruit in am; Has some snacks in the evening occasionally fried food and  sweets          Physical activity: exercise: sometimes           Dietician visit: Most recent: 11/2012          Wt Readings from Last 3 Encounters:  11/28/14 202 lb (91.627 kg)  08/28/14 197 lb (89.359 kg)  06/01/14 192 lb 3.2 oz (87.181 kg)    LABS:  Lab Results  Component Value Date   HGBA1C 7.8* 11/23/2014   HGBA1C 7.7* 08/28/2014   HGBA1C 7.5* 05/26/2014   Lab Results  Component Value Date   MICROALBUR 0.3 08/28/2014   LDLCALC 122* 02/20/2014   CREATININE 0.94 11/23/2014     Appointment on 11/23/2014  Component Date Value Ref Range Status  . Hgb A1c MFr Bld 11/23/2014 7.8* 4.6 -  6.5 % Final   Glycemic Control Guidelines for People with Diabetes:Non Diabetic:  <6%Goal of Therapy: <7%Additional Action Suggested:  >8%   . Sodium 11/23/2014 137  135 - 145 mEq/L Final  . Potassium 11/23/2014 4.5  3.5 - 5.1 mEq/L Final  . Chloride 11/23/2014 104  96 - 112 mEq/L Final  . CO2 11/23/2014 30  19 - 32 mEq/L Final  . Glucose, Bld 11/23/2014 146* 70 - 99 mg/dL Final  . BUN 11/23/2014 9  6 - 23 mg/dL Final  . Creatinine, Ser 11/23/2014 0.94  0.40 - 1.50 mg/dL Final  . Total Bilirubin 11/23/2014 0.5  0.2 - 1.2 mg/dL Final  . Alkaline Phosphatase 11/23/2014 43  39 - 117 U/L Final  . AST 11/23/2014 17  0 - 37 U/L Final  . ALT 11/23/2014 14  0 - 53 U/L Final  . Total Protein 11/23/2014 6.9  6.0 - 8.3 g/dL Final  . Albumin 11/23/2014 4.3  3.5 - 5.2 g/dL Final  . Calcium 11/23/2014 9.5  8.4 - 10.5 mg/dL Final  . GFR 11/23/2014 99.44  >60.00 mL/min Final      Medication List       This list is  accurate as of: 11/28/14  8:53 AM.  Always use your most recent med list.               donepezil 5 MG tablet  Commonly known as:  ARICEPT     glimepiride 1 MG tablet  Commonly known as:  AMARYL  Take 1 tablet (1 mg total) by mouth 2 (two) times daily.     glucose blood test strip  Commonly known as:  ONE TOUCH ULTRA TEST  Use to check blood sugar two times per day dx code 250.02     metFORMIN 500 MG tablet  Commonly known as:  GLUCOPHAGE  Take 1,000 mg by mouth 2 (two) times daily with a meal.     pioglitazone 30 MG tablet  Commonly known as:  ACTOS  TAKE ONE TABLET BY MOUTH ONCE DAILY     pravastatin 40 MG tablet  Commonly known as:  PRAVACHOL  Take 1 tablet (40 mg total) by mouth daily.     rosuvastatin 40 MG tablet  Commonly known as:  CRESTOR  Take 40 mg by mouth daily. Takes 1/2 tablet daily     VICTOZA 18 MG/3ML Sopn  Generic drug:  Liraglutide  Inject 1.2 mg into the skin daily. Inject once daily at the same time        Allergies: No Known Allergies  No past medical history on file.  Past Surgical History  Procedure Laterality Date  . Appendectomy      Family History  Problem Relation Age of Onset  . Heart disease Father   . Diabetes Brother     Social History:  reports that he has never smoked. His smokeless tobacco use includes Chew. His alcohol and drug histories are not on file.  Review of Systems:  Hypertension:  he had been on Avapro previously  and now blood pressure is fairly good without any medication, also followed by PCP  Lipids: Has previously had been taking Lipitor and previously LDL was 77, changed to Pravachol by PCP:  However has had difficulty affording this  Lab Results  Component Value Date   CHOL 174 02/20/2014   HDL 42.20 02/20/2014   LDLCALC 122* 02/20/2014   LDLDIRECT 106.9 08/28/2014   TRIG 49.0 02/20/2014   CHOLHDL 4 02/20/2014  Examination:   BP 142/90 mmHg  Pulse 69  Temp(Src) 97.9 F (36.6 C) (Oral)   Wt 202 lb (91.627 kg)  SpO2 98%  Body mass index is 29.82 kg/(m^2).    repeat blood pressure 138/80  No pedal edema  ASSESSMENT/ PLAN:  Diabetes type 2:  Blood glucose control is still inadequate with mostly high postprandial readings.    He is not active although he thinks he will be starting do more outdoor activities soon.   Because of his difficulty losing weight , postprandial hyperglycemia and difficulty controlling diet he will benefit from a higher dose of Victoza.  Advised him that he may have nausea with 1.8 mg Victoza initially.  He will also need to check the coverage for his insurance ; appears to be having similar coverage for Januvia and Victoza.    Have discussed importance of checking postprandial readings more regularly and less in the morning   Hyperlipidemia: Needs follow-up since last LDL was 107   Patient Instructions  Please check blood sugars at least half the time about 2 hours after any meal and 3 times per week on waking up. Please bring blood sugar monitor to each visit. Recommended blood sugar levels about 2 hours after meal is 140-180 and on waking up 90-130  Victoza 1.8 daily  Exercise daily      Athalee Esterline 11/28/2014, 8:53 AM

## 2014-11-28 NOTE — Patient Instructions (Addendum)
Please check blood sugars at least half the time about 2 hours after any meal and 3 times per week on waking up. Please bring blood sugar monitor to each visit. Recommended blood sugar levels about 2 hours after meal is 140-180 and on waking up 90-130  Victoza 1.8 daily  Exercise daily

## 2014-12-01 DIAGNOSIS — M25511 Pain in right shoulder: Secondary | ICD-10-CM | POA: Diagnosis not present

## 2014-12-01 DIAGNOSIS — G5621 Lesion of ulnar nerve, right upper limb: Secondary | ICD-10-CM | POA: Diagnosis not present

## 2014-12-11 DIAGNOSIS — M25511 Pain in right shoulder: Secondary | ICD-10-CM | POA: Diagnosis not present

## 2014-12-20 DIAGNOSIS — M25511 Pain in right shoulder: Secondary | ICD-10-CM | POA: Diagnosis not present

## 2014-12-22 DIAGNOSIS — R202 Paresthesia of skin: Secondary | ICD-10-CM | POA: Diagnosis not present

## 2015-01-02 ENCOUNTER — Telehealth: Payer: Self-pay | Admitting: Endocrinology

## 2015-01-02 NOTE — Telephone Encounter (Signed)
Sherry from  Dr Criss Rosales office need to know the dx code of why patient is seeing Dr Dwyane Dee. Please advise  Phone # [phone]

## 2015-01-06 ENCOUNTER — Other Ambulatory Visit: Payer: Self-pay | Admitting: Endocrinology

## 2015-01-10 DIAGNOSIS — E118 Type 2 diabetes mellitus with unspecified complications: Secondary | ICD-10-CM | POA: Diagnosis not present

## 2015-01-10 DIAGNOSIS — I10 Essential (primary) hypertension: Secondary | ICD-10-CM | POA: Diagnosis not present

## 2015-01-10 DIAGNOSIS — E785 Hyperlipidemia, unspecified: Secondary | ICD-10-CM | POA: Diagnosis not present

## 2015-01-10 DIAGNOSIS — G5621 Lesion of ulnar nerve, right upper limb: Secondary | ICD-10-CM | POA: Diagnosis not present

## 2015-01-15 DIAGNOSIS — G5621 Lesion of ulnar nerve, right upper limb: Secondary | ICD-10-CM | POA: Diagnosis not present

## 2015-01-15 DIAGNOSIS — M75121 Complete rotator cuff tear or rupture of right shoulder, not specified as traumatic: Secondary | ICD-10-CM | POA: Diagnosis not present

## 2015-01-30 ENCOUNTER — Other Ambulatory Visit: Payer: Self-pay | Admitting: *Deleted

## 2015-01-30 ENCOUNTER — Telehealth: Payer: Self-pay | Admitting: Endocrinology

## 2015-01-30 MED ORDER — GLUCOSE BLOOD VI STRP
ORAL_STRIP | Status: DC
Start: 2015-01-30 — End: 2019-02-23

## 2015-01-30 NOTE — Telephone Encounter (Signed)
Lm for patient to call back about test strips,

## 2015-01-30 NOTE — Telephone Encounter (Signed)
Patient need refill of accu check test strips

## 2015-02-23 DIAGNOSIS — G5601 Carpal tunnel syndrome, right upper limb: Secondary | ICD-10-CM | POA: Diagnosis not present

## 2015-02-23 DIAGNOSIS — G5621 Lesion of ulnar nerve, right upper limb: Secondary | ICD-10-CM | POA: Diagnosis not present

## 2015-02-27 DIAGNOSIS — G5621 Lesion of ulnar nerve, right upper limb: Secondary | ICD-10-CM | POA: Diagnosis not present

## 2015-02-27 DIAGNOSIS — G5601 Carpal tunnel syndrome, right upper limb: Secondary | ICD-10-CM | POA: Diagnosis not present

## 2015-03-01 ENCOUNTER — Ambulatory Visit (INDEPENDENT_AMBULATORY_CARE_PROVIDER_SITE_OTHER): Payer: Medicare HMO | Admitting: Endocrinology

## 2015-03-01 ENCOUNTER — Encounter: Payer: Self-pay | Admitting: Endocrinology

## 2015-03-01 VITALS — BP 128/74 | HR 72 | Temp 98.1°F | Resp 16 | Ht 69.0 in | Wt 202.2 lb

## 2015-03-01 DIAGNOSIS — E1165 Type 2 diabetes mellitus with hyperglycemia: Secondary | ICD-10-CM

## 2015-03-01 DIAGNOSIS — E785 Hyperlipidemia, unspecified: Secondary | ICD-10-CM

## 2015-03-01 DIAGNOSIS — IMO0002 Reserved for concepts with insufficient information to code with codable children: Secondary | ICD-10-CM

## 2015-03-01 LAB — MICROALBUMIN / CREATININE URINE RATIO
CREATININE, U: 38.6 mg/dL
Microalb Creat Ratio: 1.8 mg/g (ref 0.0–30.0)
Microalb, Ur: <0.7 mg/dL (ref 0.0–1.9)

## 2015-03-01 LAB — COMPREHENSIVE METABOLIC PANEL
ALBUMIN: 4.5 g/dL (ref 3.5–5.2)
ALT: 15 U/L (ref 0–53)
AST: 17 U/L (ref 0–37)
Alkaline Phosphatase: 49 U/L (ref 39–117)
BUN: 7 mg/dL (ref 6–23)
CHLORIDE: 101 meq/L (ref 96–112)
CO2: 31 mEq/L (ref 19–32)
Calcium: 9.7 mg/dL (ref 8.4–10.5)
Creatinine, Ser: 0.87 mg/dL (ref 0.40–1.50)
GFR: 108.66 mL/min (ref >60.00)
GLUCOSE: 164 mg/dL — AB (ref 70–99)
POTASSIUM: 4.3 meq/L (ref 3.5–5.1)
Sodium: 137 mEq/L (ref 135–145)
TOTAL PROTEIN: 7.1 g/dL (ref 6.0–8.3)
Total Bilirubin: 0.5 mg/dL (ref 0.2–1.2)

## 2015-03-01 LAB — LIPID PANEL
Cholesterol: 124 mg/dL (ref 0–200)
HDL: 45.2 mg/dL (ref >39.00)
LDL CALC: 68 mg/dL (ref 0–99)
NonHDL: 78.8
Total CHOL/HDL Ratio: 3
Triglycerides: 53 mg/dL (ref 0.0–149.0)
VLDL: 10.6 mg/dL (ref 0.0–40.0)

## 2015-03-01 LAB — HEMOGLOBIN A1C: Hgb A1c MFr Bld: 7.8 % — ABNORMAL HIGH (ref 4.6–6.5)

## 2015-03-01 NOTE — Progress Notes (Signed)
Patient ID: Ryan Gilmore, male   DOB: Apr 05, 1933, 81 y.o.   MRN: [id number]   Reason for Appointment: Diabetes follow-up   History of Present Illness   Diagnosis: Type 2 DIABETES MELITUS, date of diagnosis: ? 2005     Previous history: He has previously been on metformin, Amaryl and Januvia for diabetes management but has shown progressive increase in his blood sugars over the last few years requiring escalation of treatment. Also has had difficulty with compliance with diet in the past but had done better with dietary education. Because of inadequate control Actos was added in 01/2013 and was increased to 30 mg in 6/14 Although his A1c had come down to 7% initially In 9/14 his A1c had increased to 7.9 Because of higher A1c and persistently high readings he was started on Victoza instead of Januvia in 5/15  Recent history:  With Victoza his blood sugars in 2015 had improved with A1c down to 7.5 but in 2/16 was back to 7.8 Lately has been relatively inactive and appears to have higher readings compared to his last visit Also has not lost any weight; he and his wife think that he is generally watching the diet fairly well  He has some readings around 200 or more after lunch but may be checking them about an hour after eating Fasting readings are inconsistent and somewhat higher than the last visit    Oral hypoglycemic drugs: Actos 30 mg, Amaryl 1/2 bid, metformin  1 g twice a day       Side effects from medications: None       Monitors blood glucose:  1 times a day on average .    Glucometer: One Touch.          Blood Glucose readings from meter download:   PRE-MEAL Breakfast Lunch Dinner Bedtime Overall  Glucose range:  96-150   121, 141     Mean/median:  135      146    POST-MEAL PC Breakfast PC Lunch PC Dinner  Glucose range:   163-259   159, 106   Mean/median:      Mean values apply above for all meters except median for One Touch  Hypoglycemia:  none       Meals: 2  meals per day: 9 AM,  2 PM. Oatmeal, walnuts with fruit in am          Physical activity: exercise: none           Dietician visit: Most recent: 11/2012          Wt Readings from Last 3 Encounters:  03/01/15 202 lb 3.2 oz (91.717 kg)  11/28/14 202 lb (91.627 kg)  08/28/14 197 lb (89.359 kg)    LABS:  Lab Results  Component Value Date   HGBA1C 7.8* 11/23/2014   HGBA1C 7.7* 08/28/2014   HGBA1C 7.5* 05/26/2014   Lab Results  Component Value Date   MICROALBUR 0.3 08/28/2014   LDLCALC 122* 02/20/2014   CREATININE 0.94 11/23/2014         Medication List       This list is accurate as of: 03/01/15  9:47 AM.  Always use your most recent med list.               donepezil 5 MG tablet  Commonly known as:  ARICEPT     glimepiride 4 MG tablet  Commonly known as:  AMARYL     glucose blood test strip  Commonly known as:  ACCU-CHEK SMARTVIEW  Use as instructed to check blood sugar 2 times per day dx code E11.65     HYDROcodone-acetaminophen 5-325 MG per tablet  Commonly known as:  NORCO/VICODIN     metFORMIN 500 MG tablet  Commonly known as:  GLUCOPHAGE  Take 1,000 mg by mouth 2 (two) times daily with a meal.     pioglitazone 30 MG tablet  Commonly known as:  ACTOS  TAKE ONE TABLET BY MOUTH ONCE DAILY     pravastatin 40 MG tablet  Commonly known as:  PRAVACHOL  TAKE ONE TABLET BY MOUTH ONCE DAILY     VICTOZA 18 MG/3ML Sopn  Generic drug:  Liraglutide  Inject 0.3 mLs (1.8 mg total) into the skin daily. Inject once daily at the same time        Allergies: No Known Allergies  No past medical history on file.  Past Surgical History  Procedure Laterality Date  . Appendectomy      Family History  Problem Relation Age of Onset  . Heart disease Father   . Diabetes Brother     Social History:  reports that he has never smoked. His smokeless tobacco use includes Chew. His alcohol and drug histories are not on file.  Review of Systems:  Hypertension:  he  had been on Avapro previously  and now blood pressure is fairly good without any medication, also followed by PCP  Lipids: Has previously had been taking Lipitor with LDL 77, changed to Pravachol by PCP:    Lab Results  Component Value Date   CHOL 174 02/20/2014   HDL 42.20 02/20/2014   LDLCALC 122* 02/20/2014   LDLDIRECT 106.9 08/28/2014   TRIG 49.0 02/20/2014   CHOLHDL 4 02/20/2014     Examination:   BP 128/74 mmHg  Pulse 72  Temp(Src) 98.1 F (36.7 C)  Resp 16  Ht 5\' 9"  (1.753 m)  Wt 202 lb 3.2 oz (91.717 kg)  BMI 29.85 kg/m2  SpO2 94%  Body mass index is 29.85 kg/(m^2).     No pedal edema  ASSESSMENT/ PLAN:  Diabetes type 2:  Blood glucose control is still inadequate with overall high readings including after meals He is on multiple drugs currently including Victoza but has difficulty losing weight  He is not active and has not been motivated to do any walking  Will check his A1c today and decide on further management  Encouraged him to start walking daily  Hyperlipidemia: Lipids to be checked today  Patient Instructions  Check blood sugars on waking up ..  .. times a week Also check blood sugars about 2 hours after a meal and do this after different meals by rotation  Recommended blood sugar levels on waking up is 90-130 and about 2 hours after meal is 140-180 Please bring blood sugar monitor to each visit.  Walk daily     Ryan Gilmore 03/01/2015, 9:47 AM

## 2015-03-01 NOTE — Progress Notes (Signed)
Quick Note:  Please let patient know that diabetes test is still high, no change in medication as yet. Cholesterol is better Send labs to PCP  ______

## 2015-03-01 NOTE — Patient Instructions (Addendum)
Check blood sugars on waking up ..  .. times a week Also check blood sugars about 2 hours after a meal and do this after different meals by rotation Recommended blood sugar levels on waking up is 90-130 and about 2 hours after meal is 140-180 Please bring blood sugar monitor to each visit.  Walk daily 

## 2015-03-09 DIAGNOSIS — Z9889 Other specified postprocedural states: Secondary | ICD-10-CM | POA: Diagnosis not present

## 2015-04-03 ENCOUNTER — Other Ambulatory Visit: Payer: Self-pay | Admitting: Endocrinology

## 2015-04-06 DIAGNOSIS — Z9889 Other specified postprocedural states: Secondary | ICD-10-CM | POA: Diagnosis not present

## 2015-05-14 DIAGNOSIS — E119 Type 2 diabetes mellitus without complications: Secondary | ICD-10-CM | POA: Diagnosis not present

## 2015-05-14 DIAGNOSIS — I10 Essential (primary) hypertension: Secondary | ICD-10-CM | POA: Diagnosis not present

## 2015-05-14 DIAGNOSIS — E785 Hyperlipidemia, unspecified: Secondary | ICD-10-CM | POA: Diagnosis not present

## 2015-05-25 ENCOUNTER — Other Ambulatory Visit: Payer: Self-pay | Admitting: *Deleted

## 2015-05-25 MED ORDER — PRAVASTATIN SODIUM 40 MG PO TABS: 40.0000 mg | ORAL_TABLET | Freq: Every day | ORAL | Status: DC

## 2015-05-29 ENCOUNTER — Other Ambulatory Visit (INDEPENDENT_AMBULATORY_CARE_PROVIDER_SITE_OTHER): Payer: Commercial Managed Care - HMO

## 2015-05-29 DIAGNOSIS — E1165 Type 2 diabetes mellitus with hyperglycemia: Secondary | ICD-10-CM

## 2015-05-29 DIAGNOSIS — IMO0002 Reserved for concepts with insufficient information to code with codable children: Secondary | ICD-10-CM

## 2015-05-29 LAB — BASIC METABOLIC PANEL
BUN: 7 mg/dL (ref 6–23)
CHLORIDE: 103 meq/L (ref 96–112)
CO2: 29 mEq/L (ref 19–32)
CREATININE: 0.9 mg/dL (ref 0.40–1.50)
Calcium: 9.5 mg/dL (ref 8.4–10.5)
GFR: 104.42 mL/min (ref >60.00)
GLUCOSE: 163 mg/dL — AB (ref 70–99)
Potassium: 4.1 mEq/L (ref 3.5–5.1)
Sodium: 140 mEq/L (ref 135–145)

## 2015-05-29 LAB — HEMOGLOBIN A1C: Hgb A1c MFr Bld: 7.7 % — ABNORMAL HIGH (ref 4.6–6.5)

## 2015-06-01 ENCOUNTER — Ambulatory Visit (INDEPENDENT_AMBULATORY_CARE_PROVIDER_SITE_OTHER): Payer: Commercial Managed Care - HMO | Admitting: Endocrinology

## 2015-06-01 ENCOUNTER — Encounter: Payer: Self-pay | Admitting: Endocrinology

## 2015-06-01 VITALS — BP 132/86 | HR 87 | Temp 98.0°F | Resp 16 | Ht 69.0 in | Wt 199.4 lb

## 2015-06-01 DIAGNOSIS — E1165 Type 2 diabetes mellitus with hyperglycemia: Secondary | ICD-10-CM | POA: Diagnosis not present

## 2015-06-01 DIAGNOSIS — IMO0002 Reserved for concepts with insufficient information to code with codable children: Secondary | ICD-10-CM

## 2015-06-01 NOTE — Progress Notes (Signed)
Patient ID: Ryan Gilmore, male   DOB: 1932-10-22, 81 y.o.   MRN: [id number]   Reason for Appointment: Diabetes follow-up   History of Present Illness   Diagnosis: Type 2 DIABETES MELITUS, date of diagnosis: ? 2005     Previous history: He has previously been on metformin, Amaryl and Januvia for diabetes management but has shown progressive increase in his blood sugars over the last few years requiring escalation of treatment. Also has had difficulty with compliance with diet in the past but had done better with dietary education. Because of inadequate control Actos was added in 01/2013 and was increased to 30 mg in 6/14 Although his A1c had come down to 7% initially In 9/14 his A1c had increased to 7.9 Because of higher A1c and persistently high readings he was started on Victoza instead of Januvia in 5/15  Recent history:  With Victoza his blood sugars in 2015 had improved with A1c down to 7.5 but subsequently slightly higher His blood sugars have been somewhat variable and again he is usually not checking readings after meals as directed Blood sugars are variably high in the morning and usually higher after breakfast especially if he is eating high carbohydrate meals like cereal or oatmeal without protein He has only recently started getting more active with playing softball    Oral hypoglycemic drugs: Actos 30 mg, Amaryl 1/2 bid, metformin  1 g twice a day       Side effects from medications: None       Monitors blood glucose:  1 times a day or less on average .    Glucometer: One Touch.          Blood Glucose readings from meter download:   Mean values apply above for all meters except median for One Touch  PRE-MEAL Fasting Lunch Dinner  PCS  Overall  Glucose range: 113-180  123-210   151   178    Mean/median: 145     150    Hypoglycemia:  none       Meals: 2 meals per day: 9 AM,  2 PM. Oatmeal, walnuts with fruit/meat in am          Physical activity: exercise: some  walking           Dietician visit: Most recent: 11/2012          Wt Readings from Last 3 Encounters:  06/01/15 199 lb 6.4 oz (90.447 kg)  03/01/15 202 lb 3.2 oz (91.717 kg)  11/28/14 202 lb (91.627 kg)    LABS:  Lab Results  Component Value Date   HGBA1C 7.7* 05/29/2015   HGBA1C 7.8* 03/01/2015   HGBA1C 7.8* 11/23/2014   Lab Results  Component Value Date   MICROALBUR <0.7 03/01/2015   LDLCALC 68 03/01/2015   CREATININE 0.90 05/29/2015         Medication List       This list is accurate as of: 06/01/15 11:59 PM.  Always use your most recent med list.               donepezil 5 MG tablet  Commonly known as:  ARICEPT     glimepiride 4 MG tablet  Commonly known as:  AMARYL     glucose blood test strip  Commonly known as:  ACCU-CHEK SMARTVIEW  Use as instructed to check blood sugar 2 times per day dx code E11.65     HYDROcodone-acetaminophen 5-325 MG per tablet  Commonly known  as:  NORCO/VICODIN     metFORMIN 500 MG tablet  Commonly known as:  GLUCOPHAGE  Take 1,000 mg by mouth 2 (two) times daily with a meal.     pioglitazone 30 MG tablet  Commonly known as:  ACTOS  TAKE ONE TABLET BY MOUTH ONCE DAILY     pravastatin 40 MG tablet  Commonly known as:  PRAVACHOL  Take 1 tablet (40 mg total) by mouth daily.     VICTOZA 18 MG/3ML Sopn  Generic drug:  Liraglutide  Inject 0.3 mLs (1.8 mg total) into the skin daily. Inject once daily at the same time        Allergies: No Known Allergies  No past medical history on file.  Past Surgical History  Procedure Laterality Date  . Appendectomy      Family History  Problem Relation Age of Onset  . Heart disease Father   . Diabetes Brother     Social History:  reports that he has never smoked. His smokeless tobacco use includes Chew. His alcohol and drug histories are not on file.  Review of Systems:  Hypertension:  he had been on Avapro previously  Blood pressure is high normal, he is due to see his  PCP for follow-up  Lipids: Has previously had been taking Lipitor with LDL 77, changed to Pravachol by PCP with control recently:    Lab Results  Component Value Date   CHOL 124 03/01/2015   HDL 45.20 03/01/2015   LDLCALC 68 03/01/2015   LDLDIRECT 106.9 08/28/2014   TRIG 53.0 03/01/2015   CHOLHDL 3 03/01/2015     Examination:   BP 132/86 mmHg  Pulse 87  Temp(Src) 98 F (36.7 C)  Resp 16  Ht 5\' 9"  (1.753 m)  Wt 199 lb 6.4 oz (90.447 kg)  BMI 29.43 kg/m2  SpO2 93%  Body mass index is 29.43 kg/(m^2).     No pedal edema  ASSESSMENT/ PLAN:  Diabetes type 2:  Blood glucose control is still inadequate with overall high readings including after breakfast Discussed consistent diet especially adding protein at breakfast He needs to check more readings after meals Although his A1c is 7.7 this may be reasonably good for his age  He is on multiple drugs currently including Victoza but has difficulty losing weight  He is starting to be more active and encouraged him to do so and also walk when he is not playing softball  Hyperlipidemia: Lipids to be checked  periodically  Patient Instructions  Check blood sugars on waking up ..  2-3.. times a week Also check blood sugars about 2 hours after a meal and do this after different meals by rotation  Recommended blood sugar levels on waking up is 90-130 and about 2 hours after meal is 140-180 Please bring blood sugar monitor to each visit.      Elizet Kaplan 06/03/2015, 3:38 PM

## 2015-06-01 NOTE — Patient Instructions (Signed)
Check blood sugars on waking up .. 2-3 .. times a week  Also check blood sugars about 2 hours after a meal and do this after different meals by rotation  Recommended blood sugar levels on waking up is 90-130 and about 2 hours after meal is 140-180 Please bring blood sugar monitor to each visit.  

## 2015-06-07 ENCOUNTER — Telehealth: Payer: Self-pay | Admitting: Endocrinology

## 2015-06-07 NOTE — Telephone Encounter (Signed)
He will need to check the cost of Invokana 100 mg daily.  There is no alternative for Victoza and Januvia will be ineffective and still cost almost the same

## 2015-06-07 NOTE — Telephone Encounter (Signed)
Please see below and advise.

## 2015-06-07 NOTE — Telephone Encounter (Signed)
Noted, patients wife will call and find out, then call back.

## 2015-06-07 NOTE — Telephone Encounter (Signed)
Pt has to pay out over $4000 before he can get the victoza at a decent price. Is there an alternate? Maybe [place]

## 2015-06-08 ENCOUNTER — Telehealth: Payer: Self-pay | Admitting: Endocrinology

## 2015-06-08 NOTE — Telephone Encounter (Signed)
Please call pt back about medication Dr. Dwyane Dee wants him to try [phone]

## 2015-06-08 NOTE — Telephone Encounter (Signed)
I spoke with Mr. Iten wife and she said he has seen all the commercials about Invokana on tv and he does not want to take it.  He said he will start exercising and eating like his wife has told him to and see if that will help with bringing his sugar down.  She said if it doesn't help then she will call back to see what else he can take.

## 2015-06-08 NOTE — Telephone Encounter (Signed)
noted 

## 2015-06-12 ENCOUNTER — Other Ambulatory Visit: Payer: Self-pay | Admitting: *Deleted

## 2015-06-12 ENCOUNTER — Telehealth: Payer: Self-pay | Admitting: Endocrinology

## 2015-06-12 MED ORDER — VICTOZA 18 MG/3ML ~~LOC~~ SOPN: 1.8000 mg | PEN_INJECTOR | Freq: Every day | SUBCUTANEOUS | Status: DC

## 2015-06-12 NOTE — Telephone Encounter (Signed)
Patient need refill of VICTOZA 18 MG/3ML SOPN,  Send to [facility] [address] [place], [name] - [address] [phone] ([phone] (Fax)  Immunizations/Injections     Patient ask if she can get some samples as well??

## 2015-07-03 DIAGNOSIS — H25013 Cortical age-related cataract, bilateral: Secondary | ICD-10-CM | POA: Diagnosis not present

## 2015-07-03 DIAGNOSIS — E119 Type 2 diabetes mellitus without complications: Secondary | ICD-10-CM | POA: Diagnosis not present

## 2015-07-03 DIAGNOSIS — H40013 Open angle with borderline findings, low risk, bilateral: Secondary | ICD-10-CM | POA: Diagnosis not present

## 2015-07-03 DIAGNOSIS — H2513 Age-related nuclear cataract, bilateral: Secondary | ICD-10-CM | POA: Diagnosis not present

## 2015-07-03 LAB — HM DIABETES EYE EXAM

## 2015-07-30 ENCOUNTER — Other Ambulatory Visit: Payer: Self-pay | Admitting: Endocrinology

## 2015-08-02 DIAGNOSIS — H40013 Open angle with borderline findings, low risk, bilateral: Secondary | ICD-10-CM | POA: Diagnosis not present

## 2015-08-29 ENCOUNTER — Other Ambulatory Visit (INDEPENDENT_AMBULATORY_CARE_PROVIDER_SITE_OTHER): Payer: Medicare HMO

## 2015-08-29 DIAGNOSIS — E1165 Type 2 diabetes mellitus with hyperglycemia: Secondary | ICD-10-CM | POA: Diagnosis not present

## 2015-08-29 DIAGNOSIS — IMO0002 Reserved for concepts with insufficient information to code with codable children: Secondary | ICD-10-CM

## 2015-08-29 LAB — BASIC METABOLIC PANEL
BUN: 9 mg/dL (ref 6–23)
CHLORIDE: 104 meq/L (ref 96–112)
CO2: 29 mEq/L (ref 19–32)
CREATININE: 0.93 mg/dL (ref 0.40–1.50)
Calcium: 9.4 mg/dL (ref 8.4–10.5)
GFR: 100.48 mL/min (ref >60.00)
Glucose, Bld: 149 mg/dL — ABNORMAL HIGH (ref 70–99)
Potassium: 4.6 mEq/L (ref 3.5–5.1)
Sodium: 141 mEq/L (ref 135–145)

## 2015-08-29 LAB — HEMOGLOBIN A1C: HEMOGLOBIN A1C: 8 % — AB (ref 4.6–6.5)

## 2015-09-03 ENCOUNTER — Encounter: Payer: Self-pay | Admitting: Endocrinology

## 2015-09-03 ENCOUNTER — Ambulatory Visit (INDEPENDENT_AMBULATORY_CARE_PROVIDER_SITE_OTHER): Payer: Medicare HMO | Admitting: Endocrinology

## 2015-09-03 VITALS — BP 126/82 | HR 96 | Temp 98.2°F | Resp 14 | Ht 69.0 in | Wt 201.0 lb

## 2015-09-03 DIAGNOSIS — E1165 Type 2 diabetes mellitus with hyperglycemia: Secondary | ICD-10-CM

## 2015-09-03 DIAGNOSIS — Z23 Encounter for immunization: Secondary | ICD-10-CM

## 2015-09-03 NOTE — Progress Notes (Signed)
Patient ID: Ryan Gilmore, male   DOB: 1933-06-29, 81 y.o.   MRN: [id number]   Reason for Appointment: Diabetes follow-up   History of Present Illness   Diagnosis: Type 2 DIABETES MELITUS, date of diagnosis: ? 2005     Previous history: He has previously been on metformin, Amaryl and Januvia for diabetes management but has shown progressive increase in his blood sugars over the last few years requiring escalation of treatment. Also has had difficulty with compliance with diet in the past but had done better with dietary education. Because of inadequate control Actos was added in 01/2013 and was increased to 30 mg in 6/14 Although his A1c had come down to 7% initially In 9/14 his A1c had increased to 7.9 Because of higher A1c and persistently high readings he was started on Victoza instead of Januvia in 5/15  Recent history:  With adding Victoza his blood sugars in 2015 had improved with A1c down to 7.5 but subsequently has been slightly higher A1c is now 8%  Current blood sugars and problems identified:  His blood sugars have been checked mostly in the morning now with only one reading later in the day on his current monitor  His wife thinks that his blood sugars are periodically high nonfasting but usually not around 200 or more  He has not been motivated to do any exercise recently  His diet has been variable and he has gained 2 pounds    Oral hypoglycemic drugs: Actos 30 mg, Amaryl 1/2 bid, metformin  1 g twice a day       Side effects from medications: None       Monitors blood glucose:  1 times a day or less on average .    Glucometer:  Accu-Chek Blood Glucose readings from meter download:   Mean values apply above for all meters except median for One Touch  PRE-MEAL Fasting Lunch Dinner Bedtime Overall  Glucose range:  106-141    206   Mean/median: 126        Hypoglycemia:  none       Meals: 2 meals per day: 9 AM,  2 PM. Oatmeal, walnuts with fruit/meat in am          Physical activity: exercise: not walking recently                  Wt Readings from Last 3 Encounters:  09/03/15 201 lb (91.173 kg)  06/01/15 199 lb 6.4 oz (90.447 kg)  03/01/15 202 lb 3.2 oz (91.717 kg)    LABS:  Lab Results  Component Value Date   HGBA1C 8.0* 08/29/2015   HGBA1C 7.7* 05/29/2015   HGBA1C 7.8* 03/01/2015   Lab Results  Component Value Date   MICROALBUR <0.7 03/01/2015   LDLCALC 68 03/01/2015   CREATININE 0.93 08/29/2015         Medication List       This list is accurate as of: 09/03/15 12:51 PM.  Always use your most recent med list.               donepezil 5 MG tablet  Commonly known as:  ARICEPT     glimepiride 4 MG tablet  Commonly known as:  AMARYL     glucose blood test strip  Commonly known as:  ACCU-CHEK SMARTVIEW  Use as instructed to check blood sugar 2 times per day dx code E11.65     metFORMIN 500 MG tablet  Commonly known as:  GLUCOPHAGE  Take 1,000 mg by mouth 2 (two) times daily with a meal.     pioglitazone 30 MG tablet  Commonly known as:  ACTOS  TAKE ONE TABLET BY MOUTH ONCE DAILY     pravastatin 40 MG tablet  Commonly known as:  PRAVACHOL  Take 1 tablet (40 mg total) by mouth daily.     VICTOZA 18 MG/3ML Sopn  Generic drug:  Liraglutide  Inject 0.3 mLs (1.8 mg total) into the skin daily. Inject once daily at the same time        Allergies: No Known Allergies  Past Medical History  Diagnosis Date  . Diabetes mellitus without complication ([place])   . Bleeding duodenal ulcer     ?  1980s    Past Surgical History  Procedure Laterality Date  . Appendectomy      Family History  Problem Relation Age of Onset  . Heart disease Father   . Diabetes Brother     Social History:  reports that he has never smoked. His smokeless tobacco use includes Chew. His alcohol and drug histories are not on file.  Review of Systems:  Hypertension:  he had been on Avapro previously  Blood pressure is   normal Also followed by PCP  Lipids: Has previously had been taking Lipitor with LDL 77, changed to Pravachol by PCP with control as follows:    Lab Results  Component Value Date   CHOL 124 03/01/2015   HDL 45.20 03/01/2015   LDLCALC 68 03/01/2015   LDLDIRECT 106.9 08/28/2014   TRIG 53.0 03/01/2015   CHOLHDL 3 03/01/2015     Examination:   BP 126/82 mmHg  Pulse 96  Temp(Src) 98.2 F (36.8 C)  Resp 14  Ht 5\' 9"  (1.753 m)  Wt 201 lb (91.173 kg)  BMI 29.67 kg/m2  SpO2 80%  Body mass index is 29.67 kg/(m^2).   Diabetic foot exam shows normal monofilament sensation in the toes and plantar surfaces, no skin lesions or ulcers on the feet and normal pedal pulses   ASSESSMENT/ PLAN:  Diabetes type 2:  Blood glucose control is still inadequate with A1c now going up to 8% See history of present illness for detailed discussion of his current management, blood sugar patterns and problems identified He is using a new monitor recently and not clear what his postprandial readings are, fasting readings are fairly good although high in the lab last week Considering his age his A1c is still reasonable but he can do better with his diet and exercise regimen Encouraged him to start walking Also needs to check more readings after meals or less in the morning to help him with dietary compliance  May consider increasing Amaryl if his daytime blood sugars are consistently high    Patient Instructions  Check blood sugars on waking up 1-2  times a week Also check blood sugars about 2 hours after a meal and do this after different meals by rotation  Recommended blood sugar levels on waking up is 90-130 and about 2 hours after meal is 130-160  Please bring your blood sugar monitor to each visit, thank you  Start walking      Influenza vaccine given  [facility] 09/03/2015, 12:51 PM

## 2015-09-03 NOTE — Patient Instructions (Signed)
Check blood sugars on waking up 1-2  times a week Also check blood sugars about 2 hours after a meal and do this after different meals by rotation  Recommended blood sugar levels on waking up is 90-130 and about 2 hours after meal is 130-160  Please bring your blood sugar monitor to each visit, thank you  Start walking

## 2015-09-12 DIAGNOSIS — E119 Type 2 diabetes mellitus without complications: Secondary | ICD-10-CM | POA: Diagnosis not present

## 2015-09-12 DIAGNOSIS — I10 Essential (primary) hypertension: Secondary | ICD-10-CM | POA: Diagnosis not present

## 2015-11-29 ENCOUNTER — Other Ambulatory Visit: Payer: Medicare HMO

## 2015-12-03 ENCOUNTER — Other Ambulatory Visit (INDEPENDENT_AMBULATORY_CARE_PROVIDER_SITE_OTHER): Payer: Commercial Managed Care - HMO

## 2015-12-03 DIAGNOSIS — E1165 Type 2 diabetes mellitus with hyperglycemia: Secondary | ICD-10-CM

## 2015-12-03 LAB — BASIC METABOLIC PANEL
BUN: 8 mg/dL (ref 6–23)
CHLORIDE: 103 meq/L (ref 96–112)
CO2: 30 meq/L (ref 19–32)
CREATININE: 0.83 mg/dL (ref 0.40–1.50)
Calcium: 9.6 mg/dL (ref 8.4–10.5)
GFR: 114.5 mL/min (ref >60.00)
Glucose, Bld: 179 mg/dL — ABNORMAL HIGH (ref 70–99)
POTASSIUM: 4.5 meq/L (ref 3.5–5.1)
Sodium: 139 mEq/L (ref 135–145)

## 2015-12-03 LAB — HEMOGLOBIN A1C: HEMOGLOBIN A1C: 8.3 % — AB (ref 4.6–6.5)

## 2015-12-04 ENCOUNTER — Encounter: Payer: Self-pay | Admitting: Endocrinology

## 2015-12-04 ENCOUNTER — Ambulatory Visit (INDEPENDENT_AMBULATORY_CARE_PROVIDER_SITE_OTHER): Payer: Commercial Managed Care - HMO | Admitting: Endocrinology

## 2015-12-04 VITALS — BP 122/74 | HR 87 | Temp 98.2°F | Resp 14 | Ht 69.0 in | Wt 202.0 lb

## 2015-12-04 DIAGNOSIS — E1165 Type 2 diabetes mellitus with hyperglycemia: Secondary | ICD-10-CM

## 2015-12-04 NOTE — Patient Instructions (Addendum)
Check blood sugars on waking up 3  times a week Also check blood sugars about 2 hours after a meal and do this after different meals by rotation  Recommended blood sugar levels on waking up is 100-130 and about 2 hours after meal is 130-160  Please bring your blood sugar monitor to each visit, thank you  Walk daily  Full tab of Glimeperide at bedtime, may reduce if am sugar stays <100  Victoza 1.8mg 

## 2015-12-04 NOTE — Progress Notes (Signed)
Patient ID: Ryan Gilmore, male   DOB: 12/12/1932, 82 y.o.   MRN: [id number]   Reason for Appointment: Diabetes follow-up   History of Present Illness   Diagnosis: Type 2 DIABETES MELITUS, date of diagnosis: ? 2005     Previous history: He has previously been on metformin, Amaryl and Januvia for diabetes management but has shown progressive increase in his blood sugars over the last few years requiring escalation of treatment. Also has had difficulty with compliance with diet in the past but had done better with dietary education. Because of inadequate control Actos was added in 01/2013 and was increased to 30 mg in 6/14 Although his A1c had come down to 7% initially In 9/14 his A1c had increased to 7.9 Because of higher A1c and persistently high readings he was started on Victoza instead of Januvia in 5/15  Recent history:  With adding Victoza his blood sugars in 2015 had improved with A1c down to 7.5 but subsequently has been  higher A1c is now 8.3, previously 8%  Current management, blood sugars and problems identified:  His blood sugars have been mostly high in the mornings with only one good readings  He thinks his sugars may be related to snacks at night but consistent, he is not clear about his dietary history.  Last night had a candy bar late night but otherwise may fruit.  He is not very active and is sedentary  Blood sugars may be still relatively higher mid-day and afternoon when he checks them  No readings in the evenings or at bedtime  Still has difficulty losing weight    Oral hypoglycemic drugs: Actos 30 mg, Amaryl 1/2 bid, metformin  1 g twice a day       Side effects from medications: None       Monitors blood glucose:  1 times a day or less on average .    Glucometer:  Accu-Chek Blood Glucose readings from meter download:   Mean values apply above for all meters except median for One Touch  PRE-MEAL Fasting Lunch Dinner Bedtime Overall  Glucose  range:  123-201   166-200   112, 156     Mean/median:     173   POST-MEAL PC Breakfast PC Lunch PC Dinner  Glucose range:   153-207    Mean/median:      Hypoglycemia:  none       Meals: 2 meals per day: 9 AM,  2 PM. Oatmeal, walnuts with fruit/meat in am          Physical activity: exercise: not walking recently                  Wt Readings from Last 3 Encounters:  12/04/15 202 lb (91.627 kg)  09/03/15 201 lb (91.173 kg)  06/01/15 199 lb 6.4 oz (90.447 kg)    LABS:  Lab Results  Component Value Date   HGBA1C 8.3* 12/03/2015   HGBA1C 8.0* 08/29/2015   HGBA1C 7.7* 05/29/2015   Lab Results  Component Value Date   MICROALBUR <0.7 03/01/2015   LDLCALC 68 03/01/2015   CREATININE 0.83 12/03/2015    Lab on 12/03/2015  Component Date Value Ref Range Status  . Hgb A1c MFr Bld 12/03/2015 8.3* 4.6 - 6.5 % Final   Glycemic Control Guidelines for People with Diabetes:Non Diabetic:  <6%Goal of Therapy: <7%Additional Action Suggested:  >8%   . Sodium 12/03/2015 139  135 - 145 mEq/L Final  . Potassium 12/03/2015  4.5  3.5 - 5.1 mEq/L Final  . Chloride 12/03/2015 103  96 - 112 mEq/L Final  . CO2 12/03/2015 30  19 - 32 mEq/L Final  . Glucose, Bld 12/03/2015 179* 70 - 99 mg/dL Final  . BUN 12/03/2015 8  6 - 23 mg/dL Final  . Creatinine, Ser 12/03/2015 0.83  0.40 - 1.50 mg/dL Final  . Calcium 12/03/2015 9.6  8.4 - 10.5 mg/dL Final  . GFR 12/03/2015 114.50  >60.00 mL/min Final        Medication List       This list is accurate as of: 12/04/15  1:26 PM.  Always use your most recent med list.               donepezil 5 MG tablet  Commonly known as:  ARICEPT     glimepiride 4 MG tablet  Commonly known as:  AMARYL     glucose blood test strip  Commonly known as:  ACCU-CHEK SMARTVIEW  Use as instructed to check blood sugar 2 times per day dx code E11.65     metFORMIN 500 MG tablet  Commonly known as:  GLUCOPHAGE  Take 1,000 mg by mouth 2 (two) times daily with a meal.      pioglitazone 30 MG tablet  Commonly known as:  ACTOS  TAKE ONE TABLET BY MOUTH ONCE DAILY     pravastatin 40 MG tablet  Commonly known as:  PRAVACHOL  Take 1 tablet (40 mg total) by mouth daily.     VICTOZA 18 MG/3ML Sopn  Generic drug:  Liraglutide  Inject 0.3 mLs (1.8 mg total) into the skin daily. Inject once daily at the same time        Allergies: No Known Allergies  Past Medical History  Diagnosis Date  . Diabetes mellitus without complication ([place])   . Bleeding duodenal ulcer     ?  1980s    Past Surgical History  Procedure Laterality Date  . Appendectomy      Family History  Problem Relation Age of Onset  . Heart disease Father   . Diabetes Brother     Social History:  reports that he has never smoked. His smokeless tobacco use includes Chew. His alcohol and drug histories are not on file.  Review of Systems:   Lipids: Has previously had been taking Lipitor with LDL 77, changed to Pravachol by PCP with control as follows:    Lab Results  Component Value Date   CHOL 124 03/01/2015   HDL 45.20 03/01/2015   LDLCALC 68 03/01/2015   LDLDIRECT 106.9 08/28/2014   TRIG 53.0 03/01/2015   CHOLHDL 3 03/01/2015   Last foot exam was done in 11/16, normal   Examination:   BP 122/74 mmHg  Pulse 87  Temp(Src) 98.2 F (36.8 C)  Resp 14  Ht 5\' 9"  (1.753 m)  Wt 202 lb (91.627 kg)  BMI 29.82 kg/m2  SpO2 94%  Body mass index is 29.82 kg/(m^2).   No ankle edema present  ASSESSMENT/ PLAN:  Diabetes type 2:  Blood glucose control is  inadequate with A1c now going up to 8.3% See history of present illness for detailed discussion of his current management, blood sugar patterns and problems identified He has mostly high readings whenever he checks them, his wife thinks this is from snacks at night but his blood sugars are sometimes high in the afternoon also His diet can be better with avoiding foods like candy bars  Although he may  be getting more insulin  deficient he can initially try increasing the Amaryl to 4 mg at bedtime Also increase the dose of the 1.8 Make go back on the Amaryl back again if morning sugar continues to come down below 100 Regular exercise   Patient Instructions  Check blood sugars on waking up 3  times a week Also check blood sugars about 2 hours after a meal and do this after different meals by rotation  Recommended blood sugar levels on waking up is 100-130 and about 2 hours after meal is 130-160  Please bring your blood sugar monitor to each visit, thank you  Walk daily  Full tab of Glimeperide at bedtime, may reduce if am sugar stays <100  Victoza 1.8mg     Maryetta Shafer 12/04/2015, 1:26 PM

## 2015-12-05 ENCOUNTER — Encounter: Payer: Self-pay | Admitting: *Deleted

## 2016-01-07 DIAGNOSIS — E785 Hyperlipidemia, unspecified: Secondary | ICD-10-CM | POA: Diagnosis not present

## 2016-01-07 DIAGNOSIS — I10 Essential (primary) hypertension: Secondary | ICD-10-CM | POA: Diagnosis not present

## 2016-01-07 DIAGNOSIS — Z6830 Body mass index (BMI) 30.0-30.9, adult: Secondary | ICD-10-CM | POA: Diagnosis not present

## 2016-01-07 DIAGNOSIS — E1165 Type 2 diabetes mellitus with hyperglycemia: Secondary | ICD-10-CM | POA: Diagnosis not present

## 2016-01-16 ENCOUNTER — Encounter: Payer: Self-pay | Admitting: *Deleted

## 2016-01-25 DIAGNOSIS — M25521 Pain in right elbow: Secondary | ICD-10-CM | POA: Diagnosis not present

## 2016-01-25 DIAGNOSIS — M79641 Pain in right hand: Secondary | ICD-10-CM | POA: Diagnosis not present

## 2016-01-29 ENCOUNTER — Other Ambulatory Visit: Payer: Self-pay | Admitting: Endocrinology

## 2016-02-18 DIAGNOSIS — Z6830 Body mass index (BMI) 30.0-30.9, adult: Secondary | ICD-10-CM | POA: Diagnosis not present

## 2016-02-18 DIAGNOSIS — E1165 Type 2 diabetes mellitus with hyperglycemia: Secondary | ICD-10-CM | POA: Diagnosis not present

## 2016-03-04 ENCOUNTER — Other Ambulatory Visit (INDEPENDENT_AMBULATORY_CARE_PROVIDER_SITE_OTHER): Payer: Commercial Managed Care - HMO

## 2016-03-04 DIAGNOSIS — E1165 Type 2 diabetes mellitus with hyperglycemia: Secondary | ICD-10-CM | POA: Diagnosis not present

## 2016-03-04 LAB — MICROALBUMIN / CREATININE URINE RATIO
CREATININE, U: 115.8 mg/dL
MICROALB/CREAT RATIO: 0.6 mg/g (ref 0.0–30.0)

## 2016-03-04 LAB — LIPID PANEL
CHOLESTEROL: 190 mg/dL (ref 0–200)
HDL: 38.4 mg/dL — ABNORMAL LOW (ref >39.00)
LDL Cholesterol: 132 mg/dL — ABNORMAL HIGH (ref 0–99)
NonHDL: 151.86
TRIGLYCERIDES: 98 mg/dL (ref 0.0–149.0)
Total CHOL/HDL Ratio: 5
VLDL: 19.6 mg/dL (ref 0.0–40.0)

## 2016-03-04 LAB — COMPREHENSIVE METABOLIC PANEL
ALBUMIN: 4.1 g/dL (ref 3.5–5.2)
ALT: 15 U/L (ref 0–53)
AST: 16 U/L (ref 0–37)
Alkaline Phosphatase: 42 U/L (ref 39–117)
BUN: 7 mg/dL (ref 6–23)
CALCIUM: 9.2 mg/dL (ref 8.4–10.5)
CHLORIDE: 103 meq/L (ref 96–112)
CO2: 29 mEq/L (ref 19–32)
Creatinine, Ser: 0.84 mg/dL (ref 0.40–1.50)
GFR: 112.86 mL/min (ref >60.00)
Glucose, Bld: 199 mg/dL — ABNORMAL HIGH (ref 70–99)
POTASSIUM: 4.3 meq/L (ref 3.5–5.1)
SODIUM: 138 meq/L (ref 135–145)
Total Bilirubin: 0.6 mg/dL (ref 0.2–1.2)
Total Protein: 6.4 g/dL (ref 6.0–8.3)

## 2016-03-04 LAB — HEMOGLOBIN A1C: Hgb A1c MFr Bld: 9.1 % — ABNORMAL HIGH (ref 4.6–6.5)

## 2016-03-07 ENCOUNTER — Ambulatory Visit (INDEPENDENT_AMBULATORY_CARE_PROVIDER_SITE_OTHER): Payer: Commercial Managed Care - HMO | Admitting: Endocrinology

## 2016-03-07 ENCOUNTER — Other Ambulatory Visit: Payer: Self-pay | Admitting: *Deleted

## 2016-03-07 VITALS — BP 122/76 | HR 93 | Temp 97.8°F | Resp 14 | Ht 69.0 in | Wt 200.2 lb

## 2016-03-07 DIAGNOSIS — E785 Hyperlipidemia, unspecified: Secondary | ICD-10-CM

## 2016-03-07 DIAGNOSIS — E1165 Type 2 diabetes mellitus with hyperglycemia: Secondary | ICD-10-CM

## 2016-03-07 MED ORDER — INSULIN GLARGINE 100 UNIT/ML SOLOSTAR PEN: 14.0000 [IU] | PEN_INJECTOR | Freq: Every day | SUBCUTANEOUS | Status: DC

## 2016-03-07 MED ORDER — INSULIN PEN NEEDLE 32G X 4 MM MISC: Status: DC

## 2016-03-07 NOTE — Patient Instructions (Addendum)
LANTUS insulin: This insulin provides blood sugar control for up to 24 hours.   Start with 8 around suppertime units daily and increase by 2 units every 3 days until the waking up sugars are under 130.   Then continue the same dose. If blood sugar is under 90 for 2 days in a row, reduce the dose by 2 units.  Note that this insulin does not control the rise of blood sugar with meals    Stop glimepiride in the evening, continue the dose in the morning and also metformin and Victoza May stop pioglitazone when running out of it  Continue to watch carbohydrates and high fat foods Consistent exercise  Check blood sugars on waking up  daily for now Also check blood sugars about 2 hours after a meal and do this after different meals by rotation  Recommended blood sugar levels on waking up is 90-130 and about 2 hours after meal is 130-160  Please bring your blood sugar monitor to each visit, thank you

## 2016-03-07 NOTE — Progress Notes (Signed)
Patient ID: Ryan Gilmore, male   DOB: 25-Sep-1933, 82 y.o.   MRN: [id number]   Reason for Appointment: Diabetes follow-up   History of Present Illness   Diagnosis: Type 2 DIABETES MELITUS, date of diagnosis: ? 2005     Previous history: He has previously been on metformin, Amaryl and Januvia for diabetes management but has shown progressive increase in his blood sugars over the last few years requiring escalation of treatment. Also has had difficulty with compliance with diet in the past but had done better with dietary education. Because of inadequate control Actos was added in 01/2013 and was increased to 30 mg in 6/14 Although his A1c had come down to 7% initially In 9/14 his A1c had increased to 7.9 Because of higher A1c and persistently high readings he was started on Victoza instead of Januvia in 5/15  Recent history:  With adding Victoza his blood sugars in 2015 had improved with A1c down to 7.5 but subsequently has been  higher A1c is now 9.1 previously 8.3 Amaryl has been increased to 4 mg twice a day and Victoza was increased to 1.8 mg  Current management, blood sugars and problems identified:  His blood sugars were not evaluated from his monitor because it does not have a battery  Although he does not report any high readings than before and the morning his glucose in the lab was 199 fasting  His wife says that he had at least one reading around 200 at suppertime but he is usually not checking readings nonfasting  He is still not consistent with diet with sometimes eating more carbohydrate or sweets  He has done a little more activity with softball and is starting to go to a gym this month  Still has difficulty losing weight even though he has been a little more active    Oral hypoglycemic drugs: Actos 30 mg, Amaryl 1 bid, metformin  1 g twice a day       Side effects from medications: None       Monitors blood glucose:  1 times a day or less on average .     Glucometer:  Accu-Chek Blood Glucose readings from recall  Mean values apply above for all meters except median for One Touch  PRE-MEAL Fasting Lunch Dinner Bedtime Overall  Glucose range: 160-180  202    Mean/median:          Hypoglycemia:  none       Meals: 2 meals per day: 9 AM,  2 PM. Oatmeal, walnuts with fruit/meat in am          Physical activity: exercise: Playing some softball                  Wt Readings from Last 3 Encounters:  03/07/16 200 lb 3.2 oz (90.81 kg)  12/04/15 202 lb (91.627 kg)  09/03/15 201 lb (91.173 kg)    LABS:  Lab Results  Component Value Date   HGBA1C 9.1* 03/04/2016   HGBA1C 8.3* 12/03/2015   HGBA1C 8.0* 08/29/2015   Lab Results  Component Value Date   MICROALBUR <0.7 03/04/2016   LDLCALC 132* 03/04/2016   CREATININE 0.84 03/04/2016    Lab on 03/04/2016  Component Date Value Ref Range Status  . Hgb A1c MFr Bld 03/04/2016 9.1* 4.6 - 6.5 % Final   Glycemic Control Guidelines for People with Diabetes:Non Diabetic:  <6%Goal of Therapy: <7%Additional Action Suggested:  >8%   . Sodium 03/04/2016 138  135 - 145 mEq/L Final  . Potassium 03/04/2016 4.3  3.5 - 5.1 mEq/L Final  . Chloride 03/04/2016 103  96 - 112 mEq/L Final  . CO2 03/04/2016 29  19 - 32 mEq/L Final  . Glucose, Bld 03/04/2016 199* 70 - 99 mg/dL Final  . BUN 03/04/2016 7  6 - 23 mg/dL Final  . Creatinine, Ser 03/04/2016 0.84  0.40 - 1.50 mg/dL Final  . Total Bilirubin 03/04/2016 0.6  0.2 - 1.2 mg/dL Final  . Alkaline Phosphatase 03/04/2016 42  39 - 117 U/L Final  . AST 03/04/2016 16  0 - 37 U/L Final  . ALT 03/04/2016 15  0 - 53 U/L Final  . Total Protein 03/04/2016 6.4  6.0 - 8.3 g/dL Final  . Albumin 03/04/2016 4.1  3.5 - 5.2 g/dL Final  . Calcium 03/04/2016 9.2  8.4 - 10.5 mg/dL Final  . GFR 03/04/2016 112.86  >60.00 mL/min Final  . Microalb, Ur 03/04/2016 <0.7  0.0 - 1.9 mg/dL Final  . Creatinine,U 03/04/2016 115.8   Final  . Microalb Creat Ratio 03/04/2016 0.6  0.0 -  30.0 mg/g Final  . Cholesterol 03/04/2016 190  0 - 200 mg/dL Final   ATP III Classification       Desirable:  < 200 mg/dL               Borderline High:  200 - 239 mg/dL          High:  > = 240 mg/dL  . Triglycerides 03/04/2016 98.0  0.0 - 149.0 mg/dL Final   Normal:  <150 mg/dLBorderline High:  150 - 199 mg/dL  . HDL 03/04/2016 38.40* >39.00 mg/dL Final  . VLDL 03/04/2016 19.6  0.0 - 40.0 mg/dL Final  . LDL Cholesterol 03/04/2016 132* 0 - 99 mg/dL Final  . Total CHOL/HDL Ratio 03/04/2016 5   Final                  Men          Women1/2 Average Risk     3.4          3.3Average Risk          5.0          4.42X Average Risk          9.6          7.13X Average Risk          15.0          11.0                      . NonHDL 03/04/2016 151.86   Final   NOTE:  Non-HDL goal should be 30 mg/dL higher than patient's LDL goal (i.e. LDL goal of < 70 mg/dL, would have non-HDL goal of < 100 mg/dL)        Medication List       This list is accurate as of: 03/07/16  9:03 AM.  Always use your most recent med list.               donepezil 5 MG tablet  Commonly known as:  ARICEPT     glimepiride 4 MG tablet  Commonly known as:  AMARYL     glucose blood test strip  Commonly known as:  ACCU-CHEK SMARTVIEW  Use as instructed to check blood sugar 2 times per day dx code E11.65     metFORMIN 500 MG tablet  Commonly  known as:  GLUCOPHAGE  Take 1,000 mg by mouth 2 (two) times daily with a meal.     pioglitazone 30 MG tablet  Commonly known as:  ACTOS  TAKE ONE TABLET BY MOUTH ONCE DAILY     pravastatin 40 MG tablet  Commonly known as:  PRAVACHOL  Take 1 tablet (40 mg total) by mouth daily.     VICTOZA 18 MG/3ML Sopn  Generic drug:  Liraglutide  Inject 0.3 mLs (1.8 mg total) into the skin daily. Inject once daily at the same time        Allergies: No Known Allergies  Past Medical History  Diagnosis Date  . Diabetes mellitus without complication ([place])   . Bleeding duodenal ulcer     ?   1980s    Past Surgical History  Procedure Laterality Date  . Appendectomy      Family History  Problem Relation Age of Onset  . Heart disease Father   . Diabetes Brother     Social History:  reports that he has never smoked. His smokeless tobacco use includes Chew. His alcohol and drug histories are not on file.  Review of Systems:   Lipids: Has previously had been taking Lipitor with LDL 77, changed to Pravachol by PCP with control as follows:    Lab Results  Component Value Date   CHOL 190 03/04/2016   HDL 38.40* 03/04/2016   LDLCALC 132* 03/04/2016   LDLDIRECT 106.9 08/28/2014   TRIG 98.0 03/04/2016   CHOLHDL 5 03/04/2016   Last foot exam was done in 11/16, normal   Examination:   BP 122/76 mmHg  Pulse 93  Temp(Src) 97.8 F (36.6 C)  Resp 14  Ht 5\' 9"  (1.753 m)  Wt 200 lb 3.2 oz (90.81 kg)  BMI 29.55 kg/m2  SpO2 94%  Body mass index is 29.55 kg/(m^2).    ASSESSMENT/ PLAN:  Diabetes type 2:  Blood glucose control is  inadequate with A1c now going up to 9.1 and has been progressively higher See history of present illness for detailed discussion of his current management, blood sugar patterns and problems identified He has mostly high readings at home and not clear if he has higher readings after meals as indicated by his A1c Fasting in the lab was 199 and discussed that this presents insulin deficiency  He is already on a 4 drug regimen including Victoza Will start him on basal insulin, Lantus appears to be the best option with his insurance coverage Discussed in detail the actions of basal insulin, how to injected, showed him the insulin pen Discussed how this would be titrated based on fasting blood sugar and reviewed in detail the titration sheet that was given He does need to check blood sugars more often For now will reduce his Amaryl to once a day in the morning and stop Actos and then finished  He will be back in a month for follow-up, he can call  with questions and if needed we can have him see the diabetes educator also   Patient Instructions  LANTUS insulin: This insulin provides blood sugar control for up to 24 hours.   Start with 8 around suppertime units daily and increase by 2 units every 3 days until the waking up sugars are under 130.   Then continue the same dose. If blood sugar is under 90 for 2 days in a row, reduce the dose by 2 units.  Note that this insulin does not control the rise of  blood sugar with meals    Stop glimepiride in the evening, continue the dose in the morning and also metformin and Victoza May stop pioglitazone when running out of it  Continue to watch carbohydrates and high fat foods Consistent exercise  Check blood sugars on waking up  daily for now Also check blood sugars about 2 hours after a meal and do this after different meals by rotation  Recommended blood sugar levels on waking up is 90-130 and about 2 hours after meal is 130-160  Please bring your blood sugar monitor to each visit, thank you    Counseling time on subjects discussed above is over 50% of today's 25 minute visit  Journee Kohen 03/07/2016, 9:03 AM

## 2016-03-25 DIAGNOSIS — I10 Essential (primary) hypertension: Secondary | ICD-10-CM | POA: Diagnosis not present

## 2016-03-25 DIAGNOSIS — E1165 Type 2 diabetes mellitus with hyperglycemia: Secondary | ICD-10-CM | POA: Diagnosis not present

## 2016-04-02 ENCOUNTER — Other Ambulatory Visit: Payer: Medicare HMO

## 2016-04-07 ENCOUNTER — Ambulatory Visit: Payer: Medicare HMO | Admitting: Endocrinology

## 2016-05-12 ENCOUNTER — Telehealth: Payer: Self-pay | Admitting: Endocrinology

## 2016-05-12 ENCOUNTER — Other Ambulatory Visit: Payer: Self-pay

## 2016-05-12 DIAGNOSIS — I10 Essential (primary) hypertension: Secondary | ICD-10-CM | POA: Diagnosis not present

## 2016-05-12 DIAGNOSIS — Z6830 Body mass index (BMI) 30.0-30.9, adult: Secondary | ICD-10-CM | POA: Diagnosis not present

## 2016-05-12 DIAGNOSIS — E1169 Type 2 diabetes mellitus with other specified complication: Secondary | ICD-10-CM | POA: Diagnosis not present

## 2016-05-12 NOTE — Telephone Encounter (Signed)
PT needs his Lantus prescription sent to the [place]

## 2016-05-13 NOTE — Telephone Encounter (Signed)
I contacted the pt and advised we currently have the [place] on file for his medication refills. Pt was advised via vm to obtain the fax number to the [place] and call our office back so we can send.

## 2016-07-10 ENCOUNTER — Encounter: Payer: Self-pay | Admitting: Family Medicine

## 2016-07-10 ENCOUNTER — Ambulatory Visit (INDEPENDENT_AMBULATORY_CARE_PROVIDER_SITE_OTHER): Payer: Commercial Managed Care - HMO | Admitting: Family Medicine

## 2016-07-10 VITALS — BP 128/68 | HR 68 | Temp 98.2°F | Resp 14 | Ht 70.5 in | Wt 198.0 lb

## 2016-07-10 DIAGNOSIS — F0390 Unspecified dementia without behavioral disturbance: Secondary | ICD-10-CM

## 2016-07-10 DIAGNOSIS — Z7689 Persons encountering health services in other specified circumstances: Secondary | ICD-10-CM | POA: Diagnosis not present

## 2016-07-10 DIAGNOSIS — E119 Type 2 diabetes mellitus without complications: Secondary | ICD-10-CM

## 2016-07-10 DIAGNOSIS — Z794 Long term (current) use of insulin: Secondary | ICD-10-CM

## 2016-07-10 MED ORDER — PRAVASTATIN SODIUM 40 MG PO TABS: 40.0000 mg | ORAL_TABLET | Freq: Every day | ORAL | 5 refills | Status: DC

## 2016-07-10 MED ORDER — DONEPEZIL HCL 5 MG PO TABS: 5.0000 mg | ORAL_TABLET | Freq: Every day | ORAL | 5 refills | Status: DC

## 2016-07-10 NOTE — Progress Notes (Signed)
Subjective:    Patient ID: Ryan Gilmore, male    DOB: May 11, 1933, 82 y.o.   MRN: [id number]  HPI Patient is due today to establish care. Past medical history is significant for dementia. However he has been out of his Aricept for more than 3 months. His wife answers majority of questions for him. He smiles and agrees with most of things I say but he defers to his wife to answer his questions. He also has a history of diabetes mellitus type 2. He is currently on Amaryl, Actos, metformin, but toes are. His endocrinologist want him to start Lantus when his hemoglobin A1c was greater than 9. However the patient has not been able to get Lantus from his pharmacy at the [facility] and therefore he is make no changes in his medications for diabetes mellitus type 2. He also has a history of hyperlipidemia. He has been off the pravastatin for several months. I reviewed an MRI of his brain from 2013 which revealed chronic microvascular ischemic changes. He is not taking an aspirin. He has had Pneumovax 23. He believes he has had Prevnar 13. He is due today for the flu shot. Him and his advanced age, he is no longer a candidate for prostate exam or colonoscopy Past Medical History:  Diagnosis Date  . Bleeding duodenal ulcer    ?  1980s  . Diabetes mellitus without complication [facility])    Past Surgical History:  Procedure Laterality Date  . APPENDECTOMY     . Current Outpatient Prescriptions on File Prior to Visit  Medication Sig Dispense Refill  . glimepiride (AMARYL) 4 MG tablet     . glucose blood (ACCU-CHEK SMARTVIEW) test strip Use as instructed to check blood sugar 2 times per day dx code E11.65 100 each 5  . Insulin Pen Needle (BD PEN NEEDLE NANO U/F) 32G X 4 MM MISC Use 1 per day to inject insulin 30 each 3  . metFORMIN (GLUCOPHAGE) 500 MG tablet Take 1,000 mg by mouth 2 (two) times daily with a meal.     . pioglitazone (ACTOS) 30 MG tablet TAKE ONE TABLET BY MOUTH ONCE DAILY 30 tablet 5  . VICTOZA 18  MG/3ML SOPN Inject 0.3 mLs (1.8 mg total) into the skin daily. Inject once daily at the same time 3 pen 5  . Insulin Glargine (LANTUS SOLOSTAR) 100 UNIT/ML Solostar Pen Inject 14 Units into the skin daily at 10 pm. (Patient not taking: Reported on 07/10/2016) 5 pen 3   No current facility-administered medications on file prior to visit.    No Known Allergies Social History   Social History  . Marital status: Married    Spouse name: N/A  . Number of children: N/A  . Years of education: N/A   Occupational History  . Not on file.   Social History Main Topics  . Smoking status: Never Smoker  . Smokeless tobacco: Current User    Types: Chew  . Alcohol use Not on file  . Drug use: Unknown  . Sexual activity: Not on file   Other Topics Concern  . Not on file   Social History Narrative  . No narrative on file   Family History  Problem Relation Age of Onset  . Heart disease Father   . Diabetes Brother       Review of Systems  All other systems reviewed and are negative.      Objective:   Physical Exam  Constitutional: He is oriented to person,  place, and time. He appears well-developed and well-nourished. No distress.  HENT:  Right Ear: External ear normal.  Left Ear: External ear normal.  Nose: Nose normal.  Mouth/Throat: Oropharynx is clear and moist. No oropharyngeal exudate.  Eyes: Conjunctivae and EOM are normal. Pupils are equal, round, and reactive to light. Right eye exhibits no discharge. Left eye exhibits no discharge. No scleral icterus.  Neck: Normal range of motion. Neck supple. No JVD present. No tracheal deviation present. No thyromegaly present.  Cardiovascular: Normal rate, regular rhythm, normal heart sounds and intact distal pulses.  Exam reveals no gallop and no friction rub.   No murmur heard. Pulmonary/Chest: Effort normal and breath sounds normal. No stridor. No respiratory distress. He has no wheezes. He has no rales. He exhibits no tenderness.    Abdominal: Soft. Bowel sounds are normal. He exhibits no distension and no mass. There is no tenderness. There is no rebound and no guarding.  Musculoskeletal: Normal range of motion. He exhibits no edema, tenderness or deformity.  Lymphadenopathy:    He has no cervical adenopathy.  Neurological: He is alert and oriented to person, place, and time. He has normal reflexes. He displays normal reflexes. No cranial nerve deficit. He exhibits normal muscle tone. Coordination normal.  Skin: Skin is warm. No rash noted. He is not diaphoretic. No erythema. No pallor.  Vitals reviewed.         Assessment & Plan:  Encounter to establish care with new doctor  Dementia without behavioral disturbance, unspecified dementia type  Diabetes mellitus, type II, insulin dependent ([place])  The patient's blood pressure is excellent. He received his flu shot today. His diabetes is poorly controlled. Given his history of chronic microvascular ischemic changes and dementia, I want to work hard to target his hemoglobin A1c down. Therefore I gave the patient samples of basaglar and recommended that he call his endocrinologist for instructions on how to use the medication. His endocrinologist originally 100 take Lantus 8 units daily and discontinue his Actos and decrease his Amaryl by 50%. I will defer to his endocrinologist prior to making these changes. I want him to resume pravastatin immediately. Goal LDL cholesterol is less than 100. I want him to start an aspirin 81 mg daily. Pneumonia vaccines up-to-date. Cancer screening is not appropriate given his age. Resume Aricept 5 mg a day. Recheck in 3 months. Increase to 10 mg at that time and consider switching to namzeric if progressing in the future.

## 2016-07-11 ENCOUNTER — Other Ambulatory Visit: Payer: Commercial Managed Care - HMO

## 2016-07-11 DIAGNOSIS — Z794 Long term (current) use of insulin: Secondary | ICD-10-CM | POA: Diagnosis not present

## 2016-07-11 DIAGNOSIS — E1165 Type 2 diabetes mellitus with hyperglycemia: Secondary | ICD-10-CM | POA: Diagnosis not present

## 2016-07-11 DIAGNOSIS — Z125 Encounter for screening for malignant neoplasm of prostate: Secondary | ICD-10-CM

## 2016-07-11 DIAGNOSIS — Z79899 Other long term (current) drug therapy: Secondary | ICD-10-CM | POA: Diagnosis not present

## 2016-07-11 DIAGNOSIS — F0390 Unspecified dementia without behavioral disturbance: Secondary | ICD-10-CM

## 2016-07-11 DIAGNOSIS — E785 Hyperlipidemia, unspecified: Secondary | ICD-10-CM | POA: Diagnosis not present

## 2016-07-11 DIAGNOSIS — IMO0001 Reserved for inherently not codable concepts without codable children: Secondary | ICD-10-CM

## 2016-07-12 LAB — CBC WITH DIFFERENTIAL/PLATELET
BASOS ABS: 0 {cells}/uL (ref 0–200)
Basophils Relative: 0 %
EOS PCT: 3 %
Eosinophils Absolute: 192 cells/uL (ref 15–500)
HCT: 46.3 % (ref 38.5–50.0)
Hemoglobin: 15.6 g/dL (ref 13.0–17.0)
LYMPHS ABS: 2944 {cells}/uL (ref 850–3900)
LYMPHS PCT: 46 %
MCH: 30.8 pg (ref 27.0–33.0)
MCHC: 33.7 g/dL (ref 32.0–36.0)
MCV: 91.5 fL (ref 80.0–100.0)
MONOS PCT: 7 %
MPV: 11.3 fL (ref 7.5–12.5)
Monocytes Absolute: 448 cells/uL (ref 200–950)
NEUTROS ABS: 2816 {cells}/uL (ref 1500–7800)
NEUTROS PCT: 44 %
PLATELETS: 198 10*3/uL (ref 140–400)
RBC: 5.06 MIL/uL (ref 4.20–5.80)
RDW: 15 % (ref 11.0–15.0)
WBC: 6.4 10*3/uL (ref 3.8–10.8)

## 2016-07-12 LAB — COMPLETE METABOLIC PANEL WITH GFR
ALBUMIN: 4 g/dL (ref 3.6–5.1)
ALK PHOS: 43 U/L (ref 40–115)
ALT: 11 U/L (ref 9–46)
AST: 13 U/L (ref 10–35)
BILIRUBIN TOTAL: 0.4 mg/dL (ref 0.2–1.2)
BUN: 7 mg/dL (ref 7–25)
CALCIUM: 9.4 mg/dL (ref 8.6–10.3)
CO2: 29 mmol/L (ref 20–31)
Chloride: 102 mmol/L (ref 98–110)
Creat: 0.88 mg/dL (ref 0.70–1.11)
GFR, EST NON AFRICAN AMERICAN: 81 mL/min (ref >=60)
GFR, Est African American: >89 mL/min (ref >=60)
GLUCOSE: 171 mg/dL — AB (ref 70–99)
Potassium: 4.5 mmol/L (ref 3.5–5.3)
SODIUM: 140 mmol/L (ref 135–146)
TOTAL PROTEIN: 6.4 g/dL (ref 6.1–8.1)

## 2016-07-12 LAB — LIPID PANEL
Cholesterol: 167 mg/dL (ref 125–200)
HDL: 36 mg/dL — ABNORMAL LOW (ref >=40)
LDL CALC: 110 mg/dL (ref <130)
TRIGLYCERIDES: 107 mg/dL (ref <150)
Total CHOL/HDL Ratio: 4.6 Ratio (ref <=5.0)
VLDL: 21 mg/dL (ref <30)

## 2016-07-12 LAB — TSH: TSH: 1.77 mIU/L (ref 0.40–4.50)

## 2016-07-12 LAB — PSA: PSA: 4.4 ng/mL — AB (ref <=4.0)

## 2016-08-06 ENCOUNTER — Telehealth: Payer: Self-pay | Admitting: Family Medicine

## 2016-08-06 NOTE — Telephone Encounter (Signed)
[place] patients wife calling to talk to you regarding her husbands diabetic medication

## 2016-08-07 NOTE — Telephone Encounter (Signed)
LMTRC

## 2016-08-20 ENCOUNTER — Other Ambulatory Visit: Payer: Self-pay | Admitting: Family Medicine

## 2016-08-20 MED ORDER — METFORMIN HCL 500 MG PO TABS: 1000.0000 mg | ORAL_TABLET | Freq: Two times a day (BID) | ORAL | 3 refills | Status: DC

## 2016-09-01 ENCOUNTER — Encounter: Payer: Self-pay | Admitting: Family Medicine

## 2016-09-01 ENCOUNTER — Ambulatory Visit (INDEPENDENT_AMBULATORY_CARE_PROVIDER_SITE_OTHER): Payer: Commercial Managed Care - HMO | Admitting: Family Medicine

## 2016-09-01 VITALS — BP 128/74 | HR 76 | Temp 98.4°F | Resp 14 | Ht 70.5 in | Wt 195.0 lb

## 2016-09-01 DIAGNOSIS — E118 Type 2 diabetes mellitus with unspecified complications: Secondary | ICD-10-CM | POA: Diagnosis not present

## 2016-09-01 DIAGNOSIS — E1165 Type 2 diabetes mellitus with hyperglycemia: Secondary | ICD-10-CM

## 2016-09-01 DIAGNOSIS — IMO0002 Reserved for concepts with insufficient information to code with codable children: Secondary | ICD-10-CM

## 2016-09-01 NOTE — Progress Notes (Signed)
Subjective:    Patient ID: Ryan Gilmore, male    DOB: November 13, 1932, 82 y.o.   MRN: [id number]  HPI  07/10/16 Patient is due today to establish care. Past medical history is significant for dementia. However he has been out of his Aricept for more than 3 months. His wife answers majority of questions for him. He smiles and agrees with most of things I say but he defers to his wife to answer his questions. He also has a history of diabetes mellitus type 2. He is currently on Amaryl, Actos, metformin, but toes are. His endocrinologist want him to start Lantus when his hemoglobin A1c was greater than 9. However the patient has not been able to get Lantus from his pharmacy at the [facility] and therefore he is make no changes in his medications for diabetes mellitus type 2. He also has a history of hyperlipidemia. He has been off the pravastatin for several months. I reviewed an MRI of his brain from 2013 which revealed chronic microvascular ischemic changes. He is not taking an aspirin. He has had Pneumovax 23. He believes he has had Prevnar 13. He is due today for the flu shot. Him and his advanced age, he is no longer a candidate for prostate exam or colonoscopy.  At that time, my plan was: The patient's blood pressure is excellent. He received his flu shot today. His diabetes is poorly controlled. Given his history of chronic microvascular ischemic changes and dementia, I want to work hard to target his hemoglobin A1c down. Therefore I gave the patient samples of basaglar and recommended that he call his endocrinologist for instructions on how to use the medication. His endocrinologist originally 100 take Lantus 8 units daily and discontinue his Actos and decrease his Amaryl by 50%. I will defer to his endocrinologist prior to making these changes. I want him to resume pravastatin immediately. Goal LDL cholesterol is less than 100. I want him to start an aspirin 81 mg daily. Pneumonia vaccines up-to-date. Cancer  screening is not appropriate given his age. Resume Aricept 5 mg a day. Recheck in 3 months. Increase to 10 mg at that time and consider switching to namzeric if progressing in the future.  09/01/16 Patient is here today regarding his diabetes management. He is transferred to an endocrinologist at the [facility] so that he can get his insulin cheaper. However the [place] sent the patient Lantus. He became confused regarding the Lantus versus the Basaglar I gave him and therefore discontinued all insulin. Apparently he also quit  Amaryl and Actos.  He is on Metformin and Victoza.  Last hemoglobin A1c was 9.1 in May.  He has been on metformin and toes alone since early October so about 7 weeks.    Past Medical History:  Diagnosis Date  . Bleeding duodenal ulcer    ?  1980s  . Dementia   . Diabetes mellitus without complication ([place])   . Hyperlipidemia    Past Surgical History:  Procedure Laterality Date  . APPENDECTOMY     . Current Outpatient Prescriptions on File Prior to Visit  Medication Sig Dispense Refill  . donepezil (ARICEPT) 5 MG tablet Take 1 tablet (5 mg total) by mouth at bedtime. 30 tablet 5  . glimepiride (AMARYL) 4 MG tablet     . glucose blood (ACCU-CHEK SMARTVIEW) test strip Use as instructed to check blood sugar 2 times per day dx code E11.65 100 each 5  . Insulin Pen Needle (BD PEN NEEDLE  NANO U/F) 32G X 4 MM MISC Use 1 per day to inject insulin 30 each 3  . metFORMIN (GLUCOPHAGE) 500 MG tablet Take 2 tablets (1,000 mg total) by mouth 2 (two) times daily with a meal. 120 tablet 3  . pioglitazone (ACTOS) 30 MG tablet TAKE ONE TABLET BY MOUTH ONCE DAILY 30 tablet 5  . pravastatin (PRAVACHOL) 40 MG tablet Take 1 tablet (40 mg total) by mouth daily. 30 tablet 5  . VICTOZA 18 MG/3ML SOPN Inject 0.3 mLs (1.8 mg total) into the skin daily. Inject once daily at the same time 3 pen 5  . Insulin Glargine (LANTUS SOLOSTAR) 100 UNIT/ML Solostar Pen Inject 14 Units into the skin daily at 10 pm.  (Patient not taking: Reported on 09/01/2016) 5 pen 3   No current facility-administered medications on file prior to visit.    No Known Allergies Social History   Social History  . Marital status: Married    Spouse name: N/A  . Number of children: N/A  . Years of education: N/A   Occupational History  . Not on file.   Social History Main Topics  . Smoking status: Never Smoker  . Smokeless tobacco: Current User    Types: Chew  . Alcohol use Not on file  . Drug use: Unknown  . Sexual activity: Not on file   Other Topics Concern  . Not on file   Social History Narrative  . No narrative on file   Family History  Problem Relation Age of Onset  . Heart disease Father   . Diabetes Brother       Review of Systems  All other systems reviewed and are negative.      Objective:   Physical Exam  Constitutional: He is oriented to person, place, and time. He appears well-developed and well-nourished. No distress.  HENT:  Right Ear: External ear normal.  Left Ear: External ear normal.  Nose: Nose normal.  Mouth/Throat: Oropharynx is clear and moist. No oropharyngeal exudate.  Eyes: Conjunctivae and EOM are normal. Pupils are equal, round, and reactive to light. Right eye exhibits no discharge. Left eye exhibits no discharge. No scleral icterus.  Neck: Normal range of motion. Neck supple. No JVD present. No tracheal deviation present. No thyromegaly present.  Cardiovascular: Normal rate, regular rhythm, normal heart sounds and intact distal pulses.  Exam reveals no gallop and no friction rub.   No murmur heard. Pulmonary/Chest: Effort normal and breath sounds normal. No stridor. No respiratory distress. He has no wheezes. He has no rales. He exhibits no tenderness.  Abdominal: Soft. Bowel sounds are normal. He exhibits no distension and no mass. There is no tenderness. There is no rebound and no guarding.  Musculoskeletal: Normal range of motion. He exhibits no edema,  tenderness or deformity.  Lymphadenopathy:    He has no cervical adenopathy.  Neurological: He is alert and oriented to person, place, and time. He has normal reflexes. No cranial nerve deficit. He exhibits normal muscle tone. Coordination normal.  Skin: Skin is warm. No rash noted. He is not diaphoretic. No erythema. No pallor.  Vitals reviewed.         Assessment & Plan:  Uncontrolled type 2 diabetes mellitus with complication, without long-term current use of insulin ([place]) - Plan: BASIC METABOLIC PANEL WITH GFR, Hemoglobin A1c  Obtain a hemoglobin A1c today to establish a baseline. Given his dementia, insulin would be very risky for this patient as I'm not sure he will be able  to manage this long-term and would be a significant risk for hypoglycemia. My goal hemoglobin A1c for this patient is less than 7.5 to allow a buffer for safety. Hemoglobin A1c is 8.5 or better, I would continue Actos and Amaryl metformin and Victoza.  If higher than 8.5, I would continue all for these medications and also add jardiance.  Await the results of the hemoglobin A1c prior to instituting any new medications

## 2016-09-02 ENCOUNTER — Telehealth: Payer: Self-pay | Admitting: Family Medicine

## 2016-09-02 LAB — BASIC METABOLIC PANEL WITH GFR
BUN: 7 mg/dL (ref 7–25)
CALCIUM: 9.5 mg/dL (ref 8.6–10.3)
CO2: 31 mmol/L (ref 20–31)
CREATININE: 0.89 mg/dL (ref 0.70–1.11)
Chloride: 102 mmol/L (ref 98–110)
GFR, Est Non African American: 80 mL/min (ref >=60)
GLUCOSE: 176 mg/dL — AB (ref 70–99)
Potassium: 4.2 mmol/L (ref 3.5–5.3)
Sodium: 140 mmol/L (ref 135–146)

## 2016-09-02 LAB — HEMOGLOBIN A1C
Hgb A1c MFr Bld: 7.7 % — ABNORMAL HIGH (ref <5.7)
Mean Plasma Glucose: 174 mg/dL

## 2016-09-02 MED ORDER — PIOGLITAZONE HCL 30 MG PO TABS: 30.0000 mg | ORAL_TABLET | Freq: Every day | ORAL | 1 refills | Status: DC

## 2016-09-02 MED ORDER — GLIMEPIRIDE 4 MG PO TABS: 4.0000 mg | ORAL_TABLET | Freq: Every day | ORAL | 1 refills | Status: DC

## 2016-09-02 NOTE — Telephone Encounter (Signed)
-----   Message from Alyson Locket sent at 08/27/2016  4:28 PM EST ----- Needs referral to eye doc. Dr Herbert Deaner has appt 09/04/16 already scheduled.

## 2016-09-02 NOTE — Telephone Encounter (Signed)
Pt has appt for Diabetic eye exam 09/04/16 with Dr Derenda Mis.  Mcarthur Rossetti auth # [id number] for 6 visits 09/04/16 - 03/03/17  Dx E11.65

## 2016-09-04 ENCOUNTER — Encounter: Payer: Self-pay | Admitting: Family Medicine

## 2016-09-04 DIAGNOSIS — H40013 Open angle with borderline findings, low risk, bilateral: Secondary | ICD-10-CM | POA: Diagnosis not present

## 2016-09-04 DIAGNOSIS — E113293 Type 2 diabetes mellitus with mild nonproliferative diabetic retinopathy without macular edema, bilateral: Secondary | ICD-10-CM | POA: Diagnosis not present

## 2016-09-04 DIAGNOSIS — E119 Type 2 diabetes mellitus without complications: Secondary | ICD-10-CM | POA: Diagnosis not present

## 2016-09-04 DIAGNOSIS — H2513 Age-related nuclear cataract, bilateral: Secondary | ICD-10-CM | POA: Diagnosis not present

## 2016-09-04 LAB — HM DIABETES EYE EXAM

## 2016-11-10 ENCOUNTER — Ambulatory Visit (INDEPENDENT_AMBULATORY_CARE_PROVIDER_SITE_OTHER): Payer: Commercial Managed Care - HMO | Admitting: Family Medicine

## 2016-11-10 ENCOUNTER — Encounter: Payer: Self-pay | Admitting: Family Medicine

## 2016-11-10 VITALS — BP 110/70 | HR 66 | Temp 97.8°F | Resp 16 | Ht 70.5 in | Wt 188.0 lb

## 2016-11-10 DIAGNOSIS — G8929 Other chronic pain: Secondary | ICD-10-CM | POA: Diagnosis not present

## 2016-11-10 DIAGNOSIS — M545 Low back pain: Secondary | ICD-10-CM | POA: Diagnosis not present

## 2016-11-10 NOTE — Progress Notes (Signed)
Subjective:    Patient ID: Ryan Gilmore, male    DOB: [birth year], 83 y.o.   MRN: [id number]  HPI  Patient is here today complaining of low back pain for more than a year. Pain is located in the lower lumbar spine around the level of L4-L5. There is no radiation into his hips or down his legs. He denies any weakness or numbness in his legs. He denies any paresthesias in his legs. He has normal reflexes 2 over 4 equal and symmetric bilaterally at the knee as well as at the Achilles. He has no tenderness to palpation over the spinous processes of the lumbar spine. There are no palpable muscle spasms. There is no visible deformity. The pain is a dull ache. It is worse in the morning and improves throughout the day as he is out walking around.  Past Medical History:  Diagnosis Date  . Bleeding duodenal ulcer    ?  1980s  . Dementia   . Diabetes mellitus without complication ([place])   . Hyperlipidemia    Past Surgical History:  Procedure Laterality Date  . APPENDECTOMY     . Current Outpatient Prescriptions on File Prior to Visit  Medication Sig Dispense Refill  . donepezil (ARICEPT) 5 MG tablet Take 1 tablet (5 mg total) by mouth at bedtime. 30 tablet 5  . glimepiride (AMARYL) 4 MG tablet Take 1 tablet (4 mg total) by mouth daily. 90 tablet 1  . glucose blood (ACCU-CHEK SMARTVIEW) test strip Use as instructed to check blood sugar 2 times per day dx code E11.65 100 each 5  . Insulin Pen Needle (BD PEN NEEDLE NANO U/F) 32G X 4 MM MISC Use 1 per day to inject insulin 30 each 3  . metFORMIN (GLUCOPHAGE) 500 MG tablet Take 2 tablets (1,000 mg total) by mouth 2 (two) times daily with a meal. 120 tablet 3  . pioglitazone (ACTOS) 30 MG tablet Take 1 tablet (30 mg total) by mouth daily. 90 tablet 1  . pravastatin (PRAVACHOL) 40 MG tablet Take 1 tablet (40 mg total) by mouth daily. 30 tablet 5  . VICTOZA 18 MG/3ML SOPN Inject 0.3 mLs (1.8 mg total) into the skin daily. Inject once daily at the same  time 3 pen 5   No current facility-administered medications on file prior to visit.    No Known Allergies Social History   Social History  . Marital status: Married    Spouse name: N/A  . Number of children: N/A  . Years of education: N/A   Occupational History  . Not on file.   Social History Main Topics  . Smoking status: Never Smoker  . Smokeless tobacco: Current User    Types: Chew  . Alcohol use Not on file  . Drug use: Unknown  . Sexual activity: Not on file   Other Topics Concern  . Not on file   Social History Narrative  . No narrative on file   Family History  Problem Relation Age of Onset  . Heart disease Father   . Diabetes Brother       Review of Systems  Musculoskeletal: Positive for back pain.  All other systems reviewed and are negative.      Objective:   Physical Exam  Constitutional: He is oriented to person, place, and time. He appears well-developed and well-nourished. No distress.  HENT:  Right Ear: External ear normal.  Left Ear: External ear normal.  Nose: Nose normal.  Mouth/Throat: Oropharynx is  clear and moist. No oropharyngeal exudate.  Eyes: Conjunctivae and EOM are normal. Pupils are equal, round, and reactive to light. Right eye exhibits no discharge. Left eye exhibits no discharge. No scleral icterus.  Neck: Normal range of motion. Neck supple. No JVD present. No tracheal deviation present. No thyromegaly present.  Cardiovascular: Normal rate, regular rhythm, normal heart sounds and intact distal pulses.  Exam reveals no gallop and no friction rub.   No murmur heard. Pulmonary/Chest: Effort normal and breath sounds normal. No stridor. No respiratory distress. He has no wheezes. He has no rales. He exhibits no tenderness.  Abdominal: Soft. Bowel sounds are normal. He exhibits no distension and no mass. There is no tenderness. There is no rebound and no guarding.  Musculoskeletal: Normal range of motion. He exhibits no edema,  tenderness or deformity.       Lumbar back: He exhibits normal range of motion, no tenderness, no bony tenderness, no pain, no spasm and normal pulse.  Lymphadenopathy:    He has no cervical adenopathy.  Neurological: He is alert and oriented to person, place, and time. He has normal reflexes. No cranial nerve deficit. He exhibits normal muscle tone. Coordination normal.  Skin: Skin is warm. No rash noted. He is not diaphoretic. No erythema. No pallor.  Vitals reviewed.         Assessment & Plan:  Chronic low back pain without sciatica, unspecified back pain laterality - Plan: DG Lumbar Spine Complete  Exam is unremarkable. I will check an x-ray of the lumbar spine. I suspect degenerative disc disease is most likely cause combined with likely arthritis. Depending on the results of x-ray, future treatment options include NSAIDs versus physical therapy

## 2016-11-11 ENCOUNTER — Ambulatory Visit
Admission: RE | Admit: 2016-11-11 | Discharge: 2016-11-11 | Disposition: A | Discharge status: Home / Self Care | Payer: Medicare HMO | Source: Ambulatory Visit | Attending: Family Medicine | Admitting: Family Medicine

## 2016-11-11 DIAGNOSIS — M545 Low back pain: Principal | ICD-10-CM

## 2016-11-11 DIAGNOSIS — G8929 Other chronic pain: Secondary | ICD-10-CM

## 2016-11-13 ENCOUNTER — Other Ambulatory Visit: Payer: Self-pay | Admitting: Family Medicine

## 2016-11-13 MED ORDER — MELOXICAM 15 MG PO TABS: 15.0000 mg | ORAL_TABLET | Freq: Every day | ORAL | 2 refills | Status: DC

## 2016-11-24 ENCOUNTER — Ambulatory Visit (INDEPENDENT_AMBULATORY_CARE_PROVIDER_SITE_OTHER): Payer: Medicare HMO | Admitting: Family Medicine

## 2016-11-24 VITALS — BP 118/65 | HR 68 | Temp 97.7°F | Resp 16 | Ht 70.5 in | Wt 190.0 lb

## 2016-11-24 DIAGNOSIS — D171 Benign lipomatous neoplasm of skin and subcutaneous tissue of trunk: Secondary | ICD-10-CM | POA: Diagnosis not present

## 2016-11-24 DIAGNOSIS — G8929 Other chronic pain: Secondary | ICD-10-CM | POA: Diagnosis not present

## 2016-11-24 DIAGNOSIS — M545 Low back pain, unspecified: Secondary | ICD-10-CM

## 2016-11-24 NOTE — Progress Notes (Signed)
Subjective:    Patient ID: Ryan Gilmore, male    DOB: 08-12-1933, 83 y.o.   MRN: [id number]  Back Pain    11/11/16 Patient is here today complaining of low back pain for more than a year. Pain is located in the lower lumbar spine around the level of L4-L5. There is no radiation into his hips or down his legs. He denies any weakness or numbness in his legs. He denies any paresthesias in his legs. He has normal reflexes 2 over 4 equal and symmetric bilaterally at the knee as well as at the Achilles. He has no tenderness to palpation over the spinous processes of the lumbar spine. There are no palpable muscle spasms. There is no visible deformity. The pain is a dull ache. It is worse in the morning and improves throughout the day as he is out walking around.  At that time, my plan was: Exam is unremarkable. I will check an x-ray of the lumbar spine. I suspect degenerative disc disease is most likely cause combined with likely arthritis. Depending on the results of x-ray, future treatment options include NSAIDs versus physical therapy  11/24/16 Xray revealed: There is no evidence of lumbar spine fracture. Alignment is normal. Intervertebral disc spaces are narrowed at L3-4 and L4-5. Sacroiliac joints are unremarkable bilaterally. Multilevel facet joint sclerosis and joint space narrowing most prominent at L4-5 and L5-S1. No spondylolysis nor spondylolisthesis.    He is interested in options to help manage his chronic low back pain. He is also noticed a mass in the center of his back. Is approximately 3.5 cmx5 cm.  It is soft. It is mobile. It appears to be a lipoma.     Past Medical History:  Diagnosis Date  . Bleeding duodenal ulcer    ?  1980s  . Dementia   . Diabetes mellitus without complication ([place])   . Hyperlipidemia    Past Surgical History:  Procedure Laterality Date  . APPENDECTOMY     . Current Outpatient Prescriptions on File Prior to Visit  Medication Sig Dispense Refill    . donepezil (ARICEPT) 5 MG tablet Take 1 tablet (5 mg total) by mouth at bedtime. 30 tablet 5  . glimepiride (AMARYL) 4 MG tablet Take 1 tablet (4 mg total) by mouth daily. 90 tablet 1  . glucose blood (ACCU-CHEK SMARTVIEW) test strip Use as instructed to check blood sugar 2 times per day dx code E11.65 100 each 5  . Insulin Pen Needle (BD PEN NEEDLE NANO U/F) 32G X 4 MM MISC Use 1 per day to inject insulin 30 each 3  . meloxicam (MOBIC) 15 MG tablet Take 1 tablet (15 mg total) by mouth daily. 30 tablet 2  . metFORMIN (GLUCOPHAGE) 500 MG tablet Take 2 tablets (1,000 mg total) by mouth 2 (two) times daily with a meal. 120 tablet 3  . pioglitazone (ACTOS) 30 MG tablet Take 1 tablet (30 mg total) by mouth daily. 90 tablet 1  . pravastatin (PRAVACHOL) 40 MG tablet Take 1 tablet (40 mg total) by mouth daily. 30 tablet 5  . VICTOZA 18 MG/3ML SOPN Inject 0.3 mLs (1.8 mg total) into the skin daily. Inject once daily at the same time 3 pen 5   No current facility-administered medications on file prior to visit.    No Known Allergies Social History   Social History  . Marital status: Married    Spouse name: N/A  . Number of children: N/A  . Years of  education: N/A   Occupational History  . Not on file.   Social History Main Topics  . Smoking status: Never Smoker  . Smokeless tobacco: Current User    Types: Chew  . Alcohol use Not on file  . Drug use: Unknown  . Sexual activity: Not on file   Other Topics Concern  . Not on file   Social History Narrative  . No narrative on file   Family History  Problem Relation Age of Onset  . Heart disease Father   . Diabetes Brother       Review of Systems  Musculoskeletal: Positive for back pain.  All other systems reviewed and are negative.      Objective:   Physical Exam  Constitutional: He is oriented to person, place, and time. He appears well-developed and well-nourished. No distress.  HENT:  Right Ear: External ear normal.   Left Ear: External ear normal.  Nose: Nose normal.  Mouth/Throat: Oropharynx is clear and moist. No oropharyngeal exudate.  Eyes: Conjunctivae and EOM are normal. Pupils are equal, round, and reactive to light. Right eye exhibits no discharge. Left eye exhibits no discharge. No scleral icterus.  Neck: Normal range of motion. Neck supple. No JVD present. No tracheal deviation present. No thyromegaly present.  Cardiovascular: Normal rate, regular rhythm, normal heart sounds and intact distal pulses.  Exam reveals no gallop and no friction rub.   No murmur heard. Pulmonary/Chest: Effort normal and breath sounds normal. No stridor. No respiratory distress. He has no wheezes. He has no rales. He exhibits no tenderness.  Abdominal: Soft. Bowel sounds are normal. He exhibits no distension and no mass. There is no tenderness. There is no rebound and no guarding.  Musculoskeletal: Normal range of motion. He exhibits no edema, tenderness or deformity.       Lumbar back: He exhibits normal range of motion, no tenderness, no bony tenderness, no pain, no spasm and normal pulse.  Lymphadenopathy:    He has no cervical adenopathy.  Neurological: He is alert and oriented to person, place, and time. He has normal reflexes. No cranial nerve deficit. He exhibits normal muscle tone. Coordination normal.  Skin: Skin is warm. No rash noted. He is not diaphoretic. No erythema. No pallor.  Vitals reviewed.         Assessment & Plan:  Lipoma of back  Chronic low back pain without sciatica, unspecified back pain laterality - Plan: Ambulatory referral to General Surgery, Ambulatory referral to Physical Therapy  Patient like to see a general surgeon to have the lipoma removed. I will consult physical therapy for his chronic low back pain secondary to degenerative disc disease

## 2016-11-27 ENCOUNTER — Ambulatory Visit: Payer: Medicare HMO | Attending: Family Medicine | Admitting: Physical Therapy

## 2016-11-27 ENCOUNTER — Encounter: Payer: Self-pay | Admitting: Physical Therapy

## 2016-11-27 DIAGNOSIS — M545 Low back pain, unspecified: Secondary | ICD-10-CM

## 2016-11-27 DIAGNOSIS — G8929 Other chronic pain: Secondary | ICD-10-CM | POA: Diagnosis not present

## 2016-11-27 NOTE — Therapy (Signed)
[place], [name], [address] Phone: [phone]   Fax:  [phone]  Physical Therapy Evaluation  Patient Details  Name: Ryan [place] MRN: [id number] Date of Birth: 03-Oct-1933 Referring Provider: [name], MD  Encounter Date: 11/27/2016      PT End of Session - 11/27/16 0807    Visit Number 1   Number of Visits 13   Date for PT Re-Evaluation 01/09/17   Authorization Type [employer] MCR- KX at visit 15   PT Start Time 0803   PT Stop Time 0842   PT Time Calculation (min) 39 min   Activity Tolerance Patient tolerated treatment well   Behavior During Therapy [facility] for tasks assessed/performed      Past Medical History:  Diagnosis Date  . Bleeding duodenal ulcer    ?  1980s  . Dementia   . Diabetes mellitus without complication ([place])   . Hyperlipidemia     Past Surgical History:  Procedure Laterality Date  . APPENDECTOMY      There were no vitals filed for this visit.       Subjective Assessment - 11/27/16 0807    Subjective Lower, mid back hurts. Difficulty getting out of bed in the morning and standing from sofa is difficult. Began about 1 year ago. Trying to play softball in the summer. denies regular exercise otherwise. Denies radicular pain.    How long can you sit comfortably? can sit all day, just can't stand up after   How long can you stand comfortably? can stand for a while before pain begins   Patient Stated Goals play softball, stand from couch, decrease morning stiffness, raking leaves   Currently in Pain? No/denies   Pain Score 0-No pain  7/10 at worst in last 48 hr   Pain Location Back   Pain Orientation Lower   Pain Descriptors / Indicators --  stiffness   Aggravating Factors  getting out of bed, standing from sofa   Pain Relieving Factors patches & cream            [facility] PT Assessment - 11/27/16 0001      Assessment   Medical Diagnosis LBP   Referring  Provider [name], MD   Hand Dominance Right   Next MD Visit 3/9   Prior Therapy no     Precautions   Precautions Other (comment)   Precaution Comments memory     Restrictions   Weight Bearing Restrictions No     Balance Screen   Has the patient fallen in the past 6 months No     [place] residence   Living Arrangements Spouse/significant other   Additional Comments no stairs     Prior Function   Level of Independence Independent     Cognition   Overall Cognitive Status No family/caregiver present to determine baseline cognitive functioning   Memory Impaired  as reported by wife     Observation/Other Assessments   Focus on Therapeutic Outcomes (FOTO)  40% ability (goal 54%)     Sensation   Additional Comments [facility]     Posture/Postural Control   Posture Comments flat thoracic spine, increased lumbar lordosis     ROM / Strength   AROM / PROM / Strength AROM;Strength     AROM   AROM Assessment Site Lumbar   Lumbar Flexion 60  inclinometer at L4   Lumbar Extension 10   Lumbar - Right Side  Bend --  more stiffness noted this direction     Strength   Strength Assessment Site Hip   Right/Left Hip Right;Left   Right Hip Extension 3/5   Right Hip ABduction 4/5   Left Hip Extension 3-/5  prone wiht knee flexed, unable to lift knee   Left Hip ABduction 4/5                           PT Education - 11/27/16 0843    Education provided Yes   Education Details anatomy of condition, POC, HEP, exercise form/rationale   Person(s) Educated Patient;Spouse   Methods Explanation;Demonstration;Tactile cues;Verbal cues;Handout   Comprehension Verbalized understanding;Returned demonstration;Verbal cues required;Tactile cues required;Need further instruction          PT Short Term Goals - 11/27/16 0848      PT SHORT TERM GOAL #1   Title pt and wife will report pt incorporating frequent walking throughout the  day to break up long term sitting posture by 3/16   Baseline began educating at least 1/hour get up at walk at eval   Time 3   Period Weeks   Status New     PT SHORT TERM GOAL #2   Title Pt will demo at least 3/5 glut strength in prone with knee flexed   Baseline unable to lift L knee off of table at eval   Time 3   Period Weeks   Status New           PT Long Term Goals - 11/27/16 0850      PT LONG TERM GOAL #1   Title FOTO to 54% ability to indicate significant improvement in functional ability by 4/6   Baseline 40% ability at eval   Time 6   Period Weeks   Status New     PT LONG TERM GOAL #2   Title Pt will be independent with long term HEP and verbalize knowledge of stretching importance and necessary frequency   Baseline began educating at eval   Time 6   Period Weeks   Status New     PT LONG TERM GOAL #3   Title Pt will demo at least 4+/5 strength in all hip MMT to indicate appropriate strength support to lumbo pelvic region   Baseline see flowsheet   Time 6   Period Weeks   Status New     PT LONG TERM GOAL #4   Title Pt will be able to get out of bed and stand from couch with minimal to no stiffness   Baseline severe reported at eval   Time 6   Period Weeks   Status New     PT LONG TERM GOAL #5   Title Pt will demo proper core control in rotational movements to provide support to lumbar spine upon return to softball   Baseline will progress as tol, unaware of abdominal engagement at eval   Time 6   Period Weeks   Status New               Plan - 11/27/16 [id number]    Clinical Impression Statement Pt accompanied by wife today due to reported difficulty in memory. Pt reports stiffness in low back that has been present for about 1 year. Wife reports pt sits a lot throughout the day. Pt with increased lumbar lordosis and flat thoracic spine with decreased glut activation resulting in overuse of lumbar musculature and stiffness. pt will  benefit from skilled  PT in order to improve flexibility, strength and postural endurance/awareness.    Rehab Potential Good   PT Frequency 2x / week   PT Duration 6 weeks   PT Treatment/Interventions ADLs/Self Care Home Management;Cryotherapy;Electrical Stimulation;Functional mobility training;Stair training;Gait training;Moist Heat;Therapeutic activities;Therapeutic exercise;Traction;Ultrasound;Iontophoresis 4mg /ml Dexamethasone;Balance training;Neuromuscular re-education;Patient/family education;Passive range of motion;Manual techniques;Dry needling;Taping   PT Next Visit Plan review stretches, abdominal engagement, nu step   PT Home Exercise Plan thomas test stretch, seated HSS, figure 4, scapular retraction   Consulted and Agree with Plan of Care Patient      Patient will benefit from skilled therapeutic intervention in order to improve the following deficits and impairments:  Increased muscle spasms, Decreased activity tolerance, Pain, Improper body mechanics, Impaired flexibility, Decreased strength, Postural dysfunction  Visit Diagnosis: Chronic midline low back pain without sciatica - Plan: PT plan of care cert/re-cert      G-Codes - [id number]    Functional Assessment Tool Used (Outpatient Only) FOTO 40% ability (goal 54%), clinical judgement   Functional Limitation Mobility: Walking and moving around   Mobility: Walking and Moving Around Current Status [phone]) At least 60 percent but less than 80 percent impaired, limited or restricted   Mobility: Walking and Moving Around Goal Status [phone]) At least 40 percent but less than 60 percent impaired, limited or restricted       Problem List Patient Active Problem List   Diagnosis Date Noted  . Type II or unspecified type diabetes mellitus without mention of complication, uncontrolled 06/28/2013  . Other and unspecified hyperlipidemia 06/28/2013    Brylon Brenning C. Jimmey Hengel PT, DPT 11/27/16 8:56 AM   [facility] [address] [place], [name], [address] Phone: [phone]   Fax:  [phone]  Name: Ryan [place] MRN: [id number] Date of Birth: [birth year]

## 2016-12-01 ENCOUNTER — Ambulatory Visit: Payer: Medicare HMO | Admitting: Physical Therapy

## 2016-12-01 DIAGNOSIS — G8929 Other chronic pain: Secondary | ICD-10-CM

## 2016-12-01 DIAGNOSIS — M545 Low back pain, unspecified: Secondary | ICD-10-CM

## 2016-12-01 NOTE — Patient Instructions (Signed)
Bridge 10 x 1-2 x a day  Anterior hip stretch 1-3 X each, 20-30 seconds 1-2 X a day  Right only Issued from exercise drawer.

## 2016-12-01 NOTE — Therapy (Signed)
[place], [name], [address] Phone: [phone]   Fax:  [phone]  Physical Therapy Treatment  Patient Details  Name: Ryan [place] MRN: [id number] Date of Birth: 03-19-1933 Referring Provider: [name], MD  Encounter Date: 12/01/2016      PT End of Session - 12/01/16 1337    Visit Number 2   Number of Visits 13   Date for PT Re-Evaluation 01/09/17   PT Start Time 0803   PT Stop Time 0845   PT Time Calculation (min) 42 min   Activity Tolerance Patient tolerated treatment well   Behavior During Therapy [place] for tasks assessed/performed      Past Medical History:  Diagnosis Date  . Bleeding duodenal ulcer    ?  1980s  . Dementia   . Diabetes mellitus without complication ([place])   . Hyperlipidemia     Past Surgical History:  Procedure Laterality Date  . APPENDECTOMY      There were no vitals filed for this visit.      Subjective Assessment - 12/01/16 0805    Subjective I am stiff today.   Patient is accompained by: --  Wife in lobby   Currently in Pain? No/denies   Pain Location Back   Pain Orientation Lower;Mid   Pain Descriptors / Indicators --  stiff   Aggravating Factors  getting out of bed    Pain Relieving Factors patches,  cream   Multiple Pain Sites No                         [facility] Adult PT Treatment/Exercise - 12/01/16 0001      Lumbar Exercises: Stretches   Lower Trunk Rotation 5 reps;10 seconds  small motions   Hip Flexor Stretch 3 reps;30 seconds   Hip Flexor Stretch Limitations Concurrent with quad stretch,     Piriformis Stretch 3 reps;20 seconds   Piriformis Stretch Limitations manual     Lumbar Exercises: Supine   Clam 10 reps  2 sets   Bridge 10 reps   Straight Leg Raise 10 reps   Other Supine Lumbar Exercises Ball squeeze 10 X 2 sets     Manual Therapy   Manual therapy comments PROM hip IR  3 X 20 seconds,  "Feels Good"                 PT Education - 12/01/16 0845    Education provided Yes   Education Details HEP   Person(s) Educated Patient;Spouse   Methods Explanation;Tactile cues;Verbal cues;Handout   Comprehension Verbalized understanding;Returned demonstration          PT Short Term Goals - 12/01/16 1342      PT SHORT TERM GOAL #1   Title pt and wife will report pt incorporating frequent walking throughout the day to break up long term sitting posture by 3/16   Time 3   Period Weeks   Status Unable to assess     PT SHORT TERM GOAL #2   Title Pt will demo at least 3/5 glut strength in prone with knee flexed   Time 3   Period Weeks   Status Unable to assess           PT Long Term Goals - 11/27/16 0850      PT LONG TERM GOAL #1   Title FOTO to 54% ability to indicate significant improvement in functional ability by 4/6   Baseline  40% ability at eval   Time 6   Period Weeks   Status New     PT LONG TERM GOAL #2   Title Pt will be independent with long term HEP and verbalize knowledge of stretching importance and necessary frequency   Baseline began educating at eval   Time 6   Period Weeks   Status New     PT LONG TERM GOAL #3   Title Pt will demo at least 4+/5 strength in all hip MMT to indicate appropriate strength support to lumbo pelvic region   Baseline see flowsheet   Time 6   Period Weeks   Status New     PT LONG TERM GOAL #4   Title Pt will be able to get out of bed and stand from couch with minimal to no stiffness   Baseline severe reported at eval   Time 6   Period Weeks   Status New     PT LONG TERM GOAL #5   Title Pt will demo proper core control in rotational movements to provide support to lumbar spine upon return to softball   Baseline will progress as tol, unaware of abdominal engagement at eval   Time 6   Period Weeks   Status New               Plan - 12/01/16 1340    Clinical Impression Statement Progress toward HEP goals.  Wife  was a little concerned he would have too many exercises to do.  (2 ADDED TODAY)  pATIENT WAS LESS STIFF AT END OF SESSION.  pATIENT PRESENTED WITH ASYMETRICAL asis INITIALLY.  tHESE WERE LEVELES WITH EXERCISE.    PT Next Visit Plan review stretches, abdominal engagement, nu step   PT Home Exercise Plan thomas test stretch, seated HSS, figure 4, scapular retraction,  BRIDGE,  ANTERIOR HIP STRETCH   Consulted and Agree with Plan of Care Patient;Family member/caregiver   Family Member Consulted Wife      Patient will benefit from skilled therapeutic intervention in order to improve the following deficits and impairments:  Increased muscle spasms, Decreased activity tolerance, Pain, Improper body mechanics, Impaired flexibility, Decreased strength, Postural dysfunction  Visit Diagnosis: Chronic midline low back pain without sciatica     Problem List Patient Active Problem List   Diagnosis Date Noted  . Type II or unspecified type diabetes mellitus without mention of complication, uncontrolled 06/28/2013  . Other and unspecified hyperlipidemia 06/28/2013    Jania Steinke PTA 12/01/2016, 1:43 PM  [facility] [address] [place], [name], [address] Phone: [phone]   Fax:  [phone]  Name: Ryan [place] MRN: [id number] Date of Birth: 28-Jun-1933

## 2016-12-04 ENCOUNTER — Ambulatory Visit: Payer: Medicare HMO | Attending: Family Medicine | Admitting: Physical Therapy

## 2016-12-04 DIAGNOSIS — M545 Low back pain, unspecified: Secondary | ICD-10-CM

## 2016-12-04 DIAGNOSIS — G8929 Other chronic pain: Secondary | ICD-10-CM

## 2016-12-04 NOTE — Patient Instructions (Signed)
Sit to stand with small arch in low back.

## 2016-12-04 NOTE — Therapy (Signed)
[place], [address] Phone: [phone]   Fax:  [phone]  Physical Therapy Treatment  Patient Details  Name: Ryan [place] MRN: [id number] Date of Birth: 1932-11-03 Referring Provider: [name], [place]  Encounter Date: 12/04/2016    Past Medical History:  Diagnosis Date  . Bleeding duodenal ulcer    ?  1980s  . Dementia   . Diabetes mellitus without complication ([place])   . Hyperlipidemia     Past Surgical History:  Procedure Laterality Date  . APPENDECTOMY      There were no vitals filed for this visit.      Subjective Assessment - 12/04/16 0934    Subjective Wife thinks he is doing a little better.  Ryan [place] was a little sore (muscles) from the last visit. Stiff vs pain.  Less stiff getting out of bed in the morning.    Currently in Pain? No/denies   Pain Score 0-No pain   Pain Location Back   Pain Orientation Lower;Mid   Pain Descriptors / Indicators --  stiff   Aggravating Factors  geting out of bed   Pain Relieving Factors stretches,  cream , patches                         [facility] Adult PT Treatment/Exercise - 12/04/16 0001      Lumbar Exercises: Stretches   Passive Hamstring Stretch 3 reps;30 seconds  right   Single Knee to Chest Stretch 2 reps;20 seconds   Lower Trunk Rotation 5 reps;10 seconds   Lower Trunk Rotation Limitations to left to lower ASIS   Hip Flexor Stretch 3 reps;30 seconds   Hip Flexor Stretch Limitations Concurrent with quad stretch,       Lumbar Exercises: Aerobic   Stationary Bike Nustep 6 minutes,  L5     Lumbar Exercises: Seated   Hip Flexion on Ball 10 reps   Hip Flexion on Ball Limitations sit fit  no pain   Sit to Stand 5 reps   Sit to Stand Limitations cued to arch back to decrease pain   Other Seated Lumbar Exercises arm only reach,  5 X alternating,  arm/leg alternating marches reach,, cues 5 x wobbles,  no pain      Lumbar Exercises: Supine   Straight Leg Raise 20 reps   Straight Leg Raises Limitations both,  cues to keep back flat     Knee/Hip Exercises: Stretches   Gastroc Stretch 3 reps;30 seconds   Gastroc Stretch Limitations incline board,  1 at a time,  mid foot on board     Knee/Hip Exercises: Sidelying   Clams 10,  right,  cues initially,  small motions                PT Education - 12/04/16 1012    Education provided Yes   Education Details Sit to stand   Person(s) Educated Patient;Spouse   Methods Explanation;Demonstration   Comprehension Verbalized understanding;Returned demonstration          PT Short Term Goals - 12/01/16 1342      PT SHORT TERM GOAL #1   Title pt and wife will report pt incorporating frequent walking throughout the day to break up long term sitting posture by 3/16   Time 3   Period Weeks   Status Unable to assess     PT SHORT TERM GOAL #2   Title Pt will demo at  least 3/5 glut strength in prone with knee flexed   Time 3   Period Weeks   Status Unable to assess           PT Long Term Goals - 11/27/16 0850      PT LONG TERM GOAL #1   Title FOTO to 54% ability to indicate significant improvement in functional ability by 4/6   Baseline 40% ability at eval   Time 6   Period Weeks   Status New     PT LONG TERM GOAL #2   Title Pt will be independent with long term HEP and verbalize knowledge of stretching importance and necessary frequency   Baseline began educating at eval   Time 6   Period Weeks   Status New     PT LONG TERM GOAL #3   Title Pt will demo at least 4+/5 strength in all hip MMT to indicate appropriate strength support to lumbo pelvic region   Baseline see flowsheet   Time 6   Period Weeks   Status New     PT LONG TERM GOAL #4   Title Pt will be able to get out of bed and stand from couch with minimal to no stiffness   Baseline severe reported at eval   Time 6   Period Weeks   Status New     PT LONG TERM  GOAL #5   Title Pt will demo proper core control in rotational movements to provide support to lumbar spine upon return to softball   Baseline will progress as tol, unaware of abdominal engagement at eval   Time 6   Period Weeks   Status New               Plan - 12/04/16 1012    Clinical Impression Statement Less pain with sit to stand when cued for a small arch.   Right ASIS slightly higher toduring session.  Stretching made him feel good after. No nre goals met.     PT Next Visit Plan continue to stretch,  Hip strength   PT Home Exercise Plan thomas test stretch, seated HSS, figure 4, scapular retraction,  BRIDGE,  ANTERIOR HIP STRETCH   Consulted and Agree with Plan of Care Patient;Family member/caregiver   Family Member Consulted Wife      Patient will benefit from skilled therapeutic intervention in order to improve the following deficits and impairments:  Increased muscle spasms, Decreased activity tolerance, Pain, Improper body mechanics, Impaired flexibility, Decreased strength, Postural dysfunction  Visit Diagnosis: Chronic midline low back pain without sciatica     Problem List Patient Active Problem List   Diagnosis Date Noted  . Type II or unspecified type diabetes mellitus without mention of complication, uncontrolled 06/28/2013  . Other and unspecified hyperlipidemia 06/28/2013    Ryan [place] 12/04/2016, 10:18 AM  [facility] [address] [place], [place], [address] Phone: [phone]   Fax:  [phone]  Name: Ryan [place] MRN: [id number] Date of Birth: [birth year]

## 2016-12-08 ENCOUNTER — Encounter: Payer: Self-pay | Admitting: Physical Therapy

## 2016-12-08 ENCOUNTER — Ambulatory Visit: Payer: Medicare HMO | Admitting: Physical Therapy

## 2016-12-08 DIAGNOSIS — G8929 Other chronic pain: Secondary | ICD-10-CM

## 2016-12-08 DIAGNOSIS — M545 Low back pain, unspecified: Secondary | ICD-10-CM

## 2016-12-08 NOTE — Therapy (Signed)
[place], [name], [address] Phone: [phone]   Fax:  [phone]  Physical Therapy Treatment  Patient Details  Name: Ryan [place] MRN: [id number] Date of Birth: 05-Jan-1933 Referring Provider: [name], MD  Encounter Date: 12/08/2016      PT End of Session - 12/08/16 0848    Visit Number 3   Number of Visits 13   Date for PT Re-Evaluation 01/09/17   Authorization Type [employer] MCR- KX at visit 15   PT Start Time 0848   PT Stop Time 0930   PT Time Calculation (min) 42 min   Activity Tolerance Patient tolerated treatment well   Behavior During Therapy [facility] for tasks assessed/performed      Past Medical History:  Diagnosis Date  . Bleeding duodenal ulcer    ?  1980s  . Dementia   . Diabetes mellitus without complication ([place])   . Hyperlipidemia     Past Surgical History:  Procedure Laterality Date  . APPENDECTOMY      There were no vitals filed for this visit.      Subjective Assessment - 12/08/16 0848    Subjective Pt denies back pain today, says he is doing better.    Patient Stated Goals play softball, stand from couch, decrease morning stiffness, raking leaves   Currently in Pain? No/denies                         [facility] Adult PT Treatment/Exercise - 12/08/16 0001      Exercises   Exercises Lumbar     Lumbar Exercises: Stretches   Passive Hamstring Stretch Both;2 reps;30 seconds   Piriformis Stretch Limitations figure 4 30s each     Lumbar Exercises: Aerobic   Stationary Bike nu step 5 min L6     Lumbar Exercises: Standing   Heel Raises Limitations at counter for support   Functional Squats Limitations squat holding sink     Lumbar Exercises: Supine   Bridge 15 reps   Bridge Limitations bridge with ball squeeze     Lumbar Exercises: Sidelying   Hip Abduction 15 reps  both     Lumbar Exercises: Prone   Other Prone Lumbar Exercises prone  hamstring curl x15 each   Other Prone Lumbar Exercises prone hip ext with knee flexed 2x10 each     Knee/Hip Exercises: Stretches   Hip Flexor Stretch Limitations thomas test 30s each   Gastroc Stretch 2 reps;30 seconds;Both   Gastroc Stretch Limitations slant board   Other Knee/Hip Stretches all stretches beg and and of treatment                PT Education - 12/08/16 [phone]    Education provided Yes   Education Details exercise form/rationale, importance of stretching/exercise   Person(s) Educated Patient   Methods Explanation;Demonstration;Tactile cues;Verbal cues   Comprehension Verbalized understanding;Returned demonstration;Verbal cues required;Tactile cues required;Need further instruction          PT Short Term Goals - 12/01/16 1342      PT SHORT TERM GOAL #1   Title pt and wife will report pt incorporating frequent walking throughout the day to break up long term sitting posture by 3/16   Time 3   Period Weeks   Status Unable to assess     PT SHORT TERM GOAL #2   Title Pt will demo at least 3/5 glut strength in prone with knee flexed  Time 3   Period Weeks   Status Unable to assess           PT Long Term Goals - 11/27/16 0850      PT LONG TERM GOAL #1   Title FOTO to 54% ability to indicate significant improvement in functional ability by 4/6   Baseline 40% ability at eval   Time 6   Period Weeks   Status New     PT LONG TERM GOAL #2   Title Pt will be independent with long term HEP and verbalize knowledge of stretching importance and necessary frequency   Baseline began educating at eval   Time 6   Period Weeks   Status New     PT LONG TERM GOAL #3   Title Pt will demo at least 4+/5 strength in all hip MMT to indicate appropriate strength support to lumbo pelvic region   Baseline see flowsheet   Time 6   Period Weeks   Status New     PT LONG TERM GOAL #4   Title Pt will be able to get out of bed and stand from couch with minimal to no  stiffness   Baseline severe reported at eval   Time 6   Period Weeks   Status New     PT LONG TERM GOAL #5   Title Pt will demo proper core control in rotational movements to provide support to lumbar spine upon return to softball   Baseline will progress as tol, unaware of abdominal engagement at eval   Time 6   Period Weeks   Status New               Plan - 12/08/16 [id number]    Clinical Impression Statement Pt required heavy cuing for all stretches and exercises today, did not remember any of them (wife reported memory issues at eval). Significant difficulty noted with exercises today.    PT Next Visit Plan glut, hamstring, hip abductor activation; remind him of stretching when playing softball   PT Home Exercise Plan thomas test stretch, seated HSS, figure 4, scapular retraction,  BRIDGE,  ANTERIOR HIP STRETCH   Consulted and Agree with Plan of Care Patient;Family member/caregiver   Family Member Consulted Wife      Patient will benefit from skilled therapeutic intervention in order to improve the following deficits and impairments:     Visit Diagnosis: Chronic midline low back pain without sciatica     Problem List Patient Active Problem List   Diagnosis Date Noted  . Type II or unspecified type diabetes mellitus without mention of complication, uncontrolled 06/28/2013  . Other and unspecified hyperlipidemia 06/28/2013   Clarion Mooneyhan C. Samanvi Cuccia PT, DPT 12/08/16 9:36 AM   [facility] [address] [place], [name], [address] Phone: [phone]   Fax:  [phone]  Name: Ryan [place] MRN: [id number] Date of Birth: November 24, 1932

## 2016-12-11 ENCOUNTER — Ambulatory Visit: Payer: Medicare HMO | Admitting: Physical Therapy

## 2016-12-11 ENCOUNTER — Encounter: Payer: Self-pay | Admitting: Physical Therapy

## 2016-12-11 ENCOUNTER — Other Ambulatory Visit: Payer: Self-pay | Admitting: Family Medicine

## 2016-12-11 DIAGNOSIS — M545 Low back pain, unspecified: Secondary | ICD-10-CM

## 2016-12-11 DIAGNOSIS — G8929 Other chronic pain: Secondary | ICD-10-CM

## 2016-12-11 NOTE — Therapy (Signed)
[place], [name], [address] Phone: [phone]   Fax:  [phone]  Physical Therapy Treatment  Patient Details  Name: Ryan Gilmore MRN: [id number] Date of Birth: 23-Sep-1933 Referring Provider: [name], MD  Encounter Date: 12/11/2016      PT End of Session - 12/11/16 0933    Visit Number 3   Number of Visits 13   Date for PT Re-Evaluation 01/09/17   Authorization Type [employer] MCR- KX at visit 15   PT Start Time 0933   PT Stop Time 1012   PT Time Calculation (min) 39 min   Activity Tolerance Patient tolerated treatment well   Behavior During Therapy [facility] for tasks assessed/performed      Past Medical History:  Diagnosis Date  . Bleeding duodenal ulcer    ?  1980s  . Dementia   . Diabetes mellitus without complication ([place])   . Hyperlipidemia     Past Surgical History:  Procedure Laterality Date  . APPENDECTOMY      There were no vitals filed for this visit.      Subjective Assessment - 12/11/16 0933    Subjective pt reports he is feeling much better. Has not started playing softball at this time. Says he is doing his exercises at home. Denies any further use of creams or patches on his back.    Patient Stated Goals play softball, stand from couch, decrease morning stiffness, raking leaves   Currently in Pain? No/denies   Aggravating Factors  denies aggrivating factors            [facility] PT Assessment - 12/11/16 0001      Strength   Right Hip Extension 4/5   Right Hip ABduction 4+/5   Left Hip Extension 4/5   Left Hip ABduction 4+/5                     [facility] Adult PT Treatment/Exercise - 12/11/16 0001      Lumbar Exercises: Stretches   Passive Hamstring Stretch Limitations seated EOB with green strap   Hip Flexor Stretch 2 reps;30 seconds  both   Hip Flexor Stretch Limitations thomas test position     Lumbar Exercises: Aerobic   Stationary Bike nu step  5 min L5     Lumbar Exercises: Standing   Wall Slides 10 reps;5 seconds   Other Standing Lumbar Exercises standing chops yellow plyoball 2x10 each  cues for core engagement     Lumbar Exercises: Seated   Sit to Stand 10 reps   Sit to Stand Limitations ball reach overhead     Lumbar Exercises: Supine   Straight Leg Raise 15 reps  both   Straight Leg Raises Limitations ball in opp hand reach for foot, 2#     Lumbar Exercises: Prone   Other Prone Lumbar Exercises prone hamstring curl 2# x15 each   Other Prone Lumbar Exercises prone hip ext with knee flexed x15 each     Knee/Hip Exercises: Stretches   Gastroc Stretch 2 reps;30 seconds;Both   Gastroc Stretch Limitations slant board                  PT Short Term Goals - 12/01/16 1342      PT SHORT TERM GOAL #1   Title pt and wife will report pt incorporating frequent walking throughout the day to break up long term sitting posture by 3/16   Time 3   Period  Weeks   Status Unable to assess     PT SHORT TERM GOAL #2   Title Pt will demo at least 3/5 glut strength in prone with knee flexed   Time 3   Period Weeks   Status Unable to assess           PT Long Term Goals - 11/27/16 0850      PT LONG TERM GOAL #1   Title FOTO to 54% ability to indicate significant improvement in functional ability by 4/6   Baseline 40% ability at eval   Time 6   Period Weeks   Status New     PT LONG TERM GOAL #2   Title Pt will be independent with long term HEP and verbalize knowledge of stretching importance and necessary frequency   Baseline began educating at eval   Time 6   Period Weeks   Status New     PT LONG TERM GOAL #3   Title Pt will demo at least 4+/5 strength in all hip MMT to indicate appropriate strength support to lumbo pelvic region   Baseline see flowsheet   Time 6   Period Weeks   Status New     PT LONG TERM GOAL #4   Title Pt will be able to get out of bed and stand from couch with minimal to no  stiffness   Baseline severe reported at eval   Time 6   Period Weeks   Status New     PT LONG TERM GOAL #5   Title Pt will demo proper core control in rotational movements to provide support to lumbar spine upon return to softball   Baseline will progress as tol, unaware of abdominal engagement at eval   Time 6   Period Weeks   Status New               Plan - 12/11/16 6010    Clinical Impression Statement Pt reports he is doing his exercises at home but is unable to recall stretches when asked to demo them. Pt has printout at home for wife to assist. Significant increases in strength noted today indicating that he is doing exercises at home. Added rotational core challenges today which he felt in his back until cuing for abdominal use.    PT Next Visit Plan glut, hamstring, hip abductor activation; remind him of stretching when playing softball   PT Home Exercise Plan thomas test stretch, seated HSS, figure 4, scapular retraction,  BRIDGE,  ANTERIOR HIP STRETCH   Consulted and Agree with Plan of Care Patient      Patient will benefit from skilled therapeutic intervention in order to improve the following deficits and impairments:     Visit Diagnosis: Chronic midline low back pain without sciatica     Problem List Patient Active Problem List   Diagnosis Date Noted  . Type II or unspecified type diabetes mellitus without mention of complication, uncontrolled 06/28/2013  . Other and unspecified hyperlipidemia 06/28/2013   Ryan Gilmore C. Armando Lauman PT, DPT 12/11/16 10:13 AM   [facility] [address] [place], [name], [address] Phone: [phone]   Fax:  [phone]  Name: Ryan Gilmore MRN: [id number] Date of Birth: November 18, 1932

## 2016-12-12 ENCOUNTER — Encounter: Payer: Self-pay | Admitting: Family Medicine

## 2016-12-12 ENCOUNTER — Ambulatory Visit (INDEPENDENT_AMBULATORY_CARE_PROVIDER_SITE_OTHER): Payer: Medicare HMO | Admitting: Family Medicine

## 2016-12-12 VITALS — BP 128/70 | HR 68 | Temp 98.2°F | Resp 14 | Ht 70.5 in | Wt 189.0 lb

## 2016-12-12 DIAGNOSIS — E118 Type 2 diabetes mellitus with unspecified complications: Secondary | ICD-10-CM

## 2016-12-12 DIAGNOSIS — E7800 Pure hypercholesterolemia, unspecified: Secondary | ICD-10-CM | POA: Diagnosis not present

## 2016-12-12 DIAGNOSIS — E1165 Type 2 diabetes mellitus with hyperglycemia: Secondary | ICD-10-CM

## 2016-12-12 DIAGNOSIS — M545 Low back pain, unspecified: Secondary | ICD-10-CM

## 2016-12-12 DIAGNOSIS — IMO0002 Reserved for concepts with insufficient information to code with codable children: Secondary | ICD-10-CM

## 2016-12-12 DIAGNOSIS — G8929 Other chronic pain: Secondary | ICD-10-CM | POA: Diagnosis not present

## 2016-12-12 DIAGNOSIS — F0390 Unspecified dementia without behavioral disturbance: Secondary | ICD-10-CM

## 2016-12-12 DIAGNOSIS — Z23 Encounter for immunization: Secondary | ICD-10-CM

## 2016-12-12 LAB — COMPLETE METABOLIC PANEL WITH GFR
ALBUMIN: 4.4 g/dL (ref 3.6–5.1)
ALK PHOS: 41 U/L (ref 40–115)
ALT: 13 U/L (ref 9–46)
AST: 16 U/L (ref 10–35)
BILIRUBIN TOTAL: 0.6 mg/dL (ref 0.2–1.2)
BUN: 9 mg/dL (ref 7–25)
CO2: 27 mmol/L (ref 20–31)
CREATININE: 0.85 mg/dL (ref 0.70–1.11)
Calcium: 9.5 mg/dL (ref 8.6–10.3)
Chloride: 102 mmol/L (ref 98–110)
GFR, Est Non African American: 82 mL/min (ref >=60)
GLUCOSE: 126 mg/dL — AB (ref 70–99)
Potassium: 4.3 mmol/L (ref 3.5–5.3)
SODIUM: 139 mmol/L (ref 135–146)
Total Protein: 6.6 g/dL (ref 6.1–8.1)

## 2016-12-12 LAB — LIPID PANEL
CHOL/HDL RATIO: 2.9 ratio (ref <5.0)
Cholesterol: 126 mg/dL (ref <200)
HDL: 44 mg/dL (ref >40)
LDL Cholesterol: 68 mg/dL (ref <100)
Triglycerides: 68 mg/dL (ref <150)
VLDL: 14 mg/dL (ref <30)

## 2016-12-12 MED ORDER — LISINOPRIL 10 MG PO TABS: 10.0000 mg | ORAL_TABLET | Freq: Every day | ORAL | 3 refills | Status: DC

## 2016-12-12 NOTE — Progress Notes (Signed)
Subjective:    Patient ID: Ryan Gilmore, male    DOB: 02-01-1933, 83 y.o.   MRN: [id number]  Back Pain    11/11/16 Patient is here today complaining of low back pain for more than a year. Pain is located in the lower lumbar spine around the level of L4-L5. There is no radiation into his hips or down his legs. He denies any weakness or numbness in his legs. He denies any paresthesias in his legs. He has normal reflexes 2 over 4 equal and symmetric bilaterally at the knee as well as at the Achilles. He has no tenderness to palpation over the spinous processes of the lumbar spine. There are no palpable muscle spasms. There is no visible deformity. The pain is a dull ache. It is worse in the morning and improves throughout the day as he is out walking around.  At that time, my plan was: Exam is unremarkable. I will check an x-ray of the lumbar spine. I suspect degenerative disc disease is most likely cause combined with likely arthritis. Depending on the results of x-ray, future treatment options include NSAIDs versus physical therapy  11/24/16 Xray revealed: There is no evidence of lumbar spine fracture. Alignment is normal. Intervertebral disc spaces are narrowed at L3-4 and L4-5. Sacroiliac joints are unremarkable bilaterally. Multilevel facet joint sclerosis and joint space narrowing most prominent at L4-5 and L5-S1. No spondylolysis nor spondylolisthesis.    He is interested in options to help manage his chronic low back pain. He is also noticed a mass in the center of his back. Is approximately 3.5 cmx5 cm.  It is soft. It is mobile. It appears to be a lipoma.    At that time, my plan was: Patient like to see a general surgeon to have the lipoma removed. I will consult physical therapy for his chronic low back pain secondary to degenerative disc disease   12/12/16 He is here today for follow-up of his diabetes, hyperlipidemia, and dementia. He is also following up his low back pain which  I'm treating him with meloxicam and referral to physical therapy and meloxicam.  Fortunately, he has not required meloxicam in several days. Both he and his wife believe that physical therapy is helping. Rechecking his blood sugars but when they do check his blood sugar tends to be between 130 and 150. They deny any hypoglycemia. He denies any polyuria polydipsia or blurred vision. He is not on an ACE inhibitor. I am not sure why. He denies any previous medications causing a cough or having any swelling in the face or lips. He would certainly benefit from this for renal protection as well as cardiovascular protection. He is taking pravastatin. He denies any myalgias or right upper quadrant pain. He is due for diabetic foot exam. He denies any neuropathy in the feet. His dementia appears stable. I'm starting to be concerned about his wife. She recently missed an appointment with the surgeon that I schedule for a lipoma removal. She also seems a little bit confused when I ask him about his medications. There may be some early memory loss and her as well. Past Medical History:  Diagnosis Date  . Bleeding duodenal ulcer    ?  1980s  . Dementia   . Diabetes mellitus without complication ([place])   . Hyperlipidemia    Past Surgical History:  Procedure Laterality Date  . APPENDECTOMY     . Current Outpatient Prescriptions on File Prior to Visit  Medication Sig  Dispense Refill  . donepezil (ARICEPT) 5 MG tablet Take 1 tablet (5 mg total) by mouth at bedtime. 30 tablet 5  . glimepiride (AMARYL) 4 MG tablet Take 1 tablet (4 mg total) by mouth daily. 90 tablet 1  . glucose blood (ACCU-CHEK SMARTVIEW) test strip Use as instructed to check blood sugar 2 times per day dx code E11.65 100 each 5  . Insulin Pen Needle (BD PEN NEEDLE NANO U/F) 32G X 4 MM MISC Use 1 per day to inject insulin (Patient not taking: Reported on 11/27/2016) 30 each 3  . meloxicam (MOBIC) 15 MG tablet Take 1 tablet (15 mg total) by mouth  daily. 30 tablet 2  . metFORMIN (GLUCOPHAGE) 500 MG tablet Take 2 tablets (1,000 mg total) by mouth 2 (two) times daily with a meal. 120 tablet 3  . pioglitazone (ACTOS) 30 MG tablet Take 1 tablet (30 mg total) by mouth daily. 90 tablet 1  . pravastatin (PRAVACHOL) 40 MG tablet Take 1 tablet (40 mg total) by mouth daily. 30 tablet 5  . VICTOZA 18 MG/3ML SOPN Inject 0.3 mLs (1.8 mg total) into the skin daily. Inject once daily at the same time 3 pen 5   No current facility-administered medications on file prior to visit.    No Known Allergies Social History   Social History  . Marital status: Married    Spouse name: N/A  . Number of children: N/A  . Years of education: N/A   Occupational History  . Not on file.   Social History Main Topics  . Smoking status: Never Smoker  . Smokeless tobacco: Current User    Types: Chew  . Alcohol use Not on file  . Drug use: Unknown  . Sexual activity: Not on file   Other Topics Concern  . Not on file   Social History Narrative  . No narrative on file   Family History  Problem Relation Age of Onset  . Heart disease Father   . Diabetes Brother       Review of Systems  Musculoskeletal: Positive for back pain.  All other systems reviewed and are negative.      Objective:   Physical Exam  Constitutional: He is oriented to person, place, and time. He appears well-developed and well-nourished. No distress.  HENT:  Right Ear: External ear normal.  Left Ear: External ear normal.  Nose: Nose normal.  Mouth/Throat: Oropharynx is clear and moist. No oropharyngeal exudate.  Eyes: Conjunctivae and EOM are normal. Pupils are equal, round, and reactive to light. Right eye exhibits no discharge. Left eye exhibits no discharge. No scleral icterus.  Neck: Normal range of motion. Neck supple. No JVD present. No tracheal deviation present. No thyromegaly present.  Cardiovascular: Normal rate, regular rhythm, normal heart sounds and intact distal  pulses.  Exam reveals no gallop and no friction rub.   No murmur heard. Pulmonary/Chest: Effort normal and breath sounds normal. No stridor. No respiratory distress. He has no wheezes. He has no rales. He exhibits no tenderness.  Abdominal: Soft. Bowel sounds are normal. He exhibits no distension and no mass. There is no tenderness. There is no rebound and no guarding.  Musculoskeletal: Normal range of motion. He exhibits no edema, tenderness or deformity.       Lumbar back: He exhibits normal range of motion, no tenderness, no bony tenderness, no pain, no spasm and normal pulse.  Lymphadenopathy:    He has no cervical adenopathy.  Neurological: He is alert and  oriented to person, place, and time. He has normal reflexes. No cranial nerve deficit. He exhibits normal muscle tone. Coordination normal.  Skin: Skin is warm. No rash noted. He is not diaphoretic. No erythema. No pallor.  Vitals reviewed.         Assessment & Plan:   Chronic low back pain without sciatica, unspecified back pain laterality  Uncontrolled type 2 diabetes mellitus with complication, without long-term current use of insulin (HCC) - Plan: COMPLETE METABOLIC PANEL WITH GFR, Lipid panel, Microalbumin, urine, Hemoglobin A1c, lisinopril (PRINIVIL,ZESTRIL) 10 MG tablet  Dementia without behavioral disturbance, unspecified dementia type  Pure hypercholesterolemia  Back pain seems better on physical therapy. We'll continue this at the present time. I warned the patient and his wife about GI toxicity from NSAIDs. They will try to use the medicine as sparingly as possible. Previously his diabetes was out of control. I will recheck a hemoglobin A1c today. I will be very happy with this patient with an A1c between 7 and 8 given his age and his dementia. Diabetic foot exam is performed today and is completely normal. I will check a microalbumin. However I will go ahead and add lisinopril 10 mg a day for renal protection. I hesitate  at a higher dose because the patient actually has very good blood pressure and on over the dizziness. I will also check a fasting lipid panel. Goal LDL cholesterol is less than 100. Patient is not taking a baby aspirin. I recommended that he start taking aspirin 81 mg a day. His dementia seems stable. However I need to watch his wife more closely as unfortunately she may be showing early signs. Has had pneumovax 23.  Received prevnar 13 today.  Recommended annual eye exam.

## 2016-12-12 NOTE — Addendum Note (Signed)
Addended by: Shary Decamp B on: 12/12/2016 12:26 PM   Modules accepted: Orders

## 2016-12-13 LAB — HEMOGLOBIN A1C
HEMOGLOBIN A1C: 7.1 % — AB (ref <5.7)
MEAN PLASMA GLUCOSE: 157 mg/dL

## 2016-12-13 LAB — MICROALBUMIN, URINE: MICROALB UR: 0.6 mg/dL

## 2016-12-15 ENCOUNTER — Other Ambulatory Visit: Payer: Self-pay | Admitting: Family Medicine

## 2016-12-15 ENCOUNTER — Ambulatory Visit: Payer: Medicare HMO | Admitting: Physical Therapy

## 2016-12-15 ENCOUNTER — Encounter: Payer: Self-pay | Admitting: Physical Therapy

## 2016-12-15 DIAGNOSIS — G8929 Other chronic pain: Secondary | ICD-10-CM | POA: Diagnosis not present

## 2016-12-15 DIAGNOSIS — M545 Low back pain, unspecified: Secondary | ICD-10-CM

## 2016-12-15 MED ORDER — PRAVASTATIN SODIUM 40 MG PO TABS: 40.0000 mg | ORAL_TABLET | Freq: Every day | ORAL | 2 refills | Status: DC

## 2016-12-15 MED ORDER — METFORMIN HCL 500 MG PO TABS: ORAL_TABLET | ORAL | 3 refills | Status: DC

## 2016-12-15 NOTE — Patient Instructions (Signed)
   Pillow behind back when seated Stand during commercials

## 2016-12-15 NOTE — Therapy (Signed)
Ryan Gilmore, [name], [address] Phone: [phone]   Fax:  [phone]  Physical Therapy Treatment  Patient Details  Name: Ryan Gilmore MRN: [id number] Date of Birth: Jul 09, 1933 Referring Provider: [name], MD  Encounter Date: 12/15/2016      PT End of Session - 12/15/16 0849    Visit Number 4   Number of Visits 13   Date for PT Re-Evaluation 01/09/17   Authorization Type [employer] MCR- KX at visit 15   PT Start Time 0849  pt arrived late   PT Stop Time 0929   PT Time Calculation (min) 40 min   Activity Tolerance Patient tolerated treatment well   Behavior During Therapy [facility] for tasks assessed/performed      Past Medical History:  Diagnosis Date  . Bleeding duodenal ulcer    ?  1980s  . Dementia   . Diabetes mellitus without complication ([place])   . Hyperlipidemia     Past Surgical History:  Procedure Laterality Date  . APPENDECTOMY      There were no vitals filed for this visit.      Subjective Assessment - 12/15/16 0849    Subjective pt reports he is feeling better. reports stiffness is much better and really doesn't feel it anymore. Pt wife reports pt is not doing exercises at home but notices he is getting out of the chair a little better, still days with stiffness and slouches in chair often. Says she will be more strict on having him do his exercises (pt with memory impairment)   Patient Stated Goals play softball, stand from couch, decrease morning stiffness, raking leaves   Currently in Pain? No/denies                         [facility] Adult PT Treatment/Exercise - 12/15/16 0001      Lumbar Exercises: Stretches   Hip Flexor Stretch 2 reps;30 seconds  both   Hip Flexor Stretch Limitations thomas test position     Lumbar Exercises: Aerobic   Stationary Bike nu step upper & lower L7 6 min     Lumbar Exercises: Standing   Heel Raises 20 reps   Heel Raises  Limitations at counter for support   Wall Slides 20 reps   Wall Slides Limitations cues for shoulders and tailbone on wall   Row 20 reps;Both   Theraband Level (Row) Level 4 (Blue)   Row Limitations in iso mini squat   Other Standing Lumbar Exercises resisted walking blue tband   Other Standing Lumbar Exercises marches, arms overhead 2x30s     Lumbar Exercises: Supine   Bridge 15 reps   Bridge Limitations bridge with ball squeeze   Straight Leg Raise 20 reps   Straight Leg Raises Limitations both     Lumbar Exercises: Sidelying   Clam 20 reps   Clam Limitations both     Knee/Hip Exercises: Stretches   Gastroc Stretch 2 reps;30 seconds;Both   Gastroc Stretch Limitations slant board                PT Education - 12/15/16 [phone]    Education provided Yes   Education Details exercise form/rationale, HEP and progress with wife   Person(s) Educated Patient;Spouse   Methods Explanation;Demonstration;Tactile cues;Verbal cues;Handout   Comprehension Verbalized understanding;Returned demonstration;Verbal cues required;Tactile cues required;Need further instruction          PT Short Term Goals - 12/01/16 1342  PT SHORT TERM GOAL #1   Title pt and wife will report pt incorporating frequent walking throughout the day to break up long term sitting posture by 3/16   Time 3   Period Weeks   Status Unable to assess     PT SHORT TERM GOAL #2   Title Pt will demo at least 3/5 glut strength in prone with knee flexed   Time 3   Period Weeks   Status Unable to assess           PT Long Term Goals - 11/27/16 0850      PT LONG TERM GOAL #1   Title FOTO to 54% ability to indicate significant improvement in functional ability by 4/6   Baseline 40% ability at eval   Time 6   Period Weeks   Status New     PT LONG TERM GOAL #2   Title Pt will be independent with long term HEP and verbalize knowledge of stretching importance and necessary frequency   Baseline began  educating at eval   Time 6   Period Weeks   Status New     PT LONG TERM GOAL #3   Title Pt will demo at least 4+/5 strength in all hip MMT to indicate appropriate strength support to lumbo pelvic region   Baseline see flowsheet   Time 6   Period Weeks   Status New     PT LONG TERM GOAL #4   Title Pt will be able to get out of bed and stand from couch with minimal to no stiffness   Baseline severe reported at eval   Time 6   Period Weeks   Status New     PT LONG TERM GOAL #5   Title Pt will demo proper core control in rotational movements to provide support to lumbar spine upon return to softball   Baseline will progress as tol, unaware of abdominal engagement at eval   Time 6   Period Weeks   Status New               Plan - 12/15/16 6433    Clinical Impression Statement Discussed progress with wife who reported pt has still had stiffness and pain with limited complaince with HEP. Provided her with printout and education for HEP so they can more easily remember and work it into their day. Pt fatigued with exercises but did not verbalize or demo and facial reports of pain.    PT Next Visit Plan glut activation, prone lumbar exercises   PT Home Exercise Plan thomas test stretch, seated HSS, figure 4, scapular retraction, wall slides, seated clam green tband   Consulted and Agree with Plan of Care Patient;Family member/caregiver   Family Member Consulted Wife      Patient will benefit from skilled therapeutic intervention in order to improve the following deficits and impairments:     Visit Diagnosis: Chronic midline low back pain without sciatica     Problem List Patient Active Problem List   Diagnosis Date Noted  . Type II or unspecified type diabetes mellitus without mention of complication, uncontrolled 06/28/2013  . Other and unspecified hyperlipidemia 06/28/2013   Ryan Gilmore PT, DPT 12/15/16 9:31 AM   [facility] [address] [place], [name], [address] Phone: [phone]   Fax:  [phone]  Name: Ryan Gilmore MRN: [id number] Date of Birth: [birth year]

## 2016-12-18 ENCOUNTER — Encounter: Payer: Self-pay | Admitting: Physical Therapy

## 2016-12-18 ENCOUNTER — Ambulatory Visit: Payer: Medicare HMO | Admitting: Physical Therapy

## 2016-12-18 DIAGNOSIS — G8929 Other chronic pain: Secondary | ICD-10-CM

## 2016-12-18 DIAGNOSIS — M545 Low back pain, unspecified: Secondary | ICD-10-CM

## 2016-12-18 NOTE — Therapy (Signed)
[place], [name], [address] Phone: [phone]   Fax:  [phone]  Physical Therapy Treatment  Patient Details  Name: Ryan Gilmore MRN: [id number] Date of Birth: [birth year] Referring Provider: [name], MD  Encounter Date: 12/18/2016      PT End of Session - 12/18/16 0854    Visit Number 5   Number of Visits 13   Date for PT Re-Evaluation 01/09/17   Authorization Type [employer] MCR- KX at visit 15   PT Start Time 0848   PT Stop Time 0929   PT Time Calculation (min) 41 min   Activity Tolerance Patient tolerated treatment well   Behavior During Therapy [facility] for tasks assessed/performed      Past Medical History:  Diagnosis Date  . Bleeding duodenal ulcer    ?  1980s  . Dementia   . Diabetes mellitus without complication ([place])   . Hyperlipidemia     Past Surgical History:  Procedure Laterality Date  . APPENDECTOMY      There were no vitals filed for this visit.      Subjective Assessment - 12/18/16 0854    Subjective pt reports he is a little stiff and tired this morning. sat and watched basketball games but says he is getting up during commercials    Currently in Pain? No/denies   Pain Descriptors / Indicators --  stiff                         [facility] Adult PT Treatment/Exercise - 12/18/16 0001      Lumbar Exercises: Aerobic   Stationary Bike recumbant bike 6 min L5     Lumbar Exercises: Standing   Heel Raises 20 reps;5 reps   Heel Raises Limitations at counter for support   Row 20 reps;Both   Theraband Level (Row) Level 4 (Blue)   Row Limitations iso mini squat   Other Standing Lumbar Exercises standing march with twist   Other Standing Lumbar Exercises lat pull green tband     Lumbar Exercises: Seated   Hip Flexion on Ball Limitations seated march with twist   Sit to Stand 15 reps     Lumbar Exercises: Supine   Straight Leg Raise 20 reps   Straight  Leg Raises Limitations bilat, opp leg at table top     Lumbar Exercises: Prone   Other Prone Lumbar Exercises prone hamstring curl x15 each   Other Prone Lumbar Exercises prone hip ext with knee flexed x15 each     Knee/Hip Exercises: Stretches   Passive Hamstring Stretch Both;30 seconds   Passive Hamstring Stretch Limitations seated EOB   Gastroc Stretch 2 reps;30 seconds;Both   Gastroc Stretch Limitations slant board   Other Knee/Hip Stretches figure 4     Knee/Hip Exercises: Sidelying   Clams 30 each                PT Education - 12/18/16 0855    Education provided Yes   Education Details exercise form/rationale   Person(s) Educated Patient;Spouse   Methods Explanation;Demonstration;Tactile cues;Verbal cues   Comprehension Verbalized understanding;Returned demonstration;Verbal cues required;Tactile cues required;Need further instruction          PT Short Term Goals - 12/01/16 1342      PT SHORT TERM GOAL #1   Title pt and wife will report pt incorporating frequent walking throughout the day to break up long term sitting posture by 3/16  Time 3   Period Weeks   Status Unable to assess     PT SHORT TERM GOAL #2   Title Pt will demo at least 3/5 glut strength in prone with knee flexed   Time 3   Period Weeks   Status Unable to assess           PT Long Term Goals - 11/27/16 0850      PT LONG TERM GOAL #1   Title FOTO to 54% ability to indicate significant improvement in functional ability by 4/6   Baseline 40% ability at eval   Time 6   Period Weeks   Status New     PT LONG TERM GOAL #2   Title Pt will be independent with long term HEP and verbalize knowledge of stretching importance and necessary frequency   Baseline began educating at eval   Time 6   Period Weeks   Status New     PT LONG TERM GOAL #3   Title Pt will demo at least 4+/5 strength in all hip MMT to indicate appropriate strength support to lumbo pelvic region   Baseline see  flowsheet   Time 6   Period Weeks   Status New     PT LONG TERM GOAL #4   Title Pt will be able to get out of bed and stand from couch with minimal to no stiffness   Baseline severe reported at eval   Time 6   Period Weeks   Status New     PT LONG TERM GOAL #5   Title Pt will demo proper core control in rotational movements to provide support to lumbar spine upon return to softball   Baseline will progress as tol, unaware of abdominal engagement at eval   Time 6   Period Weeks   Status New               Plan - 12/18/16 2035    Clinical Impression Statement Pt tolerated increase in number of exercises and was able to perform with good form. verbalized fatigue but did not request rest breaks, no complaints of increased pain. stressed importance of HEP complaince again.    PT Next Visit Plan standing balance challenges in parallel bars, re-eval & gcode   Consulted and Agree with Plan of Care Patient;Family member/caregiver   Family Member Consulted Wife      Patient will benefit from skilled therapeutic intervention in order to improve the following deficits and impairments:     Visit Diagnosis: Chronic midline low back pain without sciatica     Problem List Patient Active Problem List   Diagnosis Date Noted  . Type II or unspecified type diabetes mellitus without mention of complication, uncontrolled 06/28/2013  . Other and unspecified hyperlipidemia 06/28/2013    [name] PT, DPT 12/18/16 9:30 AM   [place], [name], [address] Phone: [phone]   Fax:  [phone]  Name: Ryan Gilmore MRN: [id number] Date of Birth: 10-10-1932

## 2016-12-22 ENCOUNTER — Ambulatory Visit: Payer: Medicare HMO | Admitting: Physical Therapy

## 2016-12-22 ENCOUNTER — Encounter: Payer: Self-pay | Admitting: Physical Therapy

## 2016-12-22 DIAGNOSIS — G8929 Other chronic pain: Secondary | ICD-10-CM | POA: Diagnosis not present

## 2016-12-22 DIAGNOSIS — M545 Low back pain, unspecified: Secondary | ICD-10-CM

## 2016-12-22 NOTE — Therapy (Addendum)
[place], [name], [address] Phone: [phone]   Fax:  [phone]  Physical Therapy Treatment  Patient Details  Name: Ryan Gilmore MRN: [id number] Date of Birth: 07/06/1933 Referring Provider: [name], MD  Encounter Date: 12/22/2016      PT End of Session - 12/22/16 0852    Visit Number 7  corrected visit count   Number of Visits 13   Date for PT Re-Evaluation 01/09/17   Authorization Type [employer] [place] at visit 15   PT Start Time 0851   PT Stop Time 0929   PT Time Calculation (min) 38 min   Activity Tolerance Patient tolerated treatment well   Behavior During Therapy [facility] for tasks assessed/performed      Past Medical History:  Diagnosis Date  . Bleeding duodenal ulcer    ?  1980s  . Dementia   . Diabetes mellitus without complication ([place])   . Hyperlipidemia     Past Surgical History:  Procedure Laterality Date  . APPENDECTOMY      There were no vitals filed for this visit.      Subjective Assessment - 12/22/16 0853    Subjective Pt reports he was very stiff saturday and sunday. Wife says he sat most of the weekend and did not do his exercises. Reports back feels pretty good, pain in knees.    Patient Stated Goals play softball, stand from couch, decrease morning stiffness, raking leaves   Currently in Pain? Yes   Pain Score 6    Pain Location Knee   Pain Orientation Right;Left            [facility] PT Assessment - 12/22/16 0001      AROM   Lumbar Flexion 60   Lumbar Extension 18   Lumbar - Right Side Bend --  bilateral to just above joint line     Strength   Right Hip Extension 4+/5   Right Hip ABduction 5/5   Left Hip Extension 4/5   Left Hip ABduction 5/5                     [facility] Adult PT Treatment/Exercise - 12/22/16 0001      Lumbar Exercises: Stretches   Passive Hamstring Stretch Limitations supine with green strap   Lower Trunk  Rotation 3 reps;10 seconds     Lumbar Exercises: Aerobic   Stationary Bike recumbant bike 7 min L4     Lumbar Exercises: Standing   Other Standing Lumbar Exercises marches with small trunk twist, holding physioball 2x30s     Lumbar Exercises: Seated   Other Seated Lumbar Exercises seated UE iso press into physioball     Lumbar Exercises: Sidelying   Clam 20 reps;10 reps   Clam Limitations both     Knee/Hip Exercises: Stretches   Gastroc Stretch 2 reps;30 seconds;Both   Gastroc Stretch Limitations slant board                PT Education - 12/22/16 [phone]    Education provided Yes   Education Details exercise form/rationale, importance of mobility/being still =stiff, comparing work of PT exercises to body requirements to play softball, standing and walking during commercials    Person(s) Educated Patient;Spouse   Methods Explanation;Demonstration;Tactile cues;Verbal cues   Comprehension Verbalized understanding;Returned demonstration;Verbal cues required;Tactile cues required;Need further instruction          PT Short Term Goals - 12/22/16 0350  PT SHORT TERM GOAL #1   Title pt and wife will report pt incorporating frequent walking throughout the day to break up long term sitting posture by 3/16   Baseline wife tries to encourage him but pt wants to "wait until later"     stressed importance of mobility   Status On-going     PT SHORT TERM GOAL #2   Title Pt will demo at least 3/5 glut strength in prone with knee flexed   Baseline see flowhseet   Status Achieved           PT Long Term Goals - 11/27/16 0850      PT LONG TERM GOAL #1   Title FOTO to 54% ability to indicate significant improvement in functional ability by 4/6   Baseline 40% ability at eval   Time 6   Period Weeks   Status New     PT LONG TERM GOAL #2   Title Pt will be independent with long term HEP and verbalize knowledge of stretching importance and necessary frequency   Baseline  began educating at eval   Time 6   Period Weeks   Status New     PT LONG TERM GOAL #3   Title Pt will demo at least 4+/5 strength in all hip MMT to indicate appropriate strength support to lumbo pelvic region   Baseline see flowsheet   Time 6   Period Weeks   Status New     PT LONG TERM GOAL #4   Title Pt will be able to get out of bed and stand from couch with minimal to no stiffness   Baseline severe reported at eval   Time 6   Period Weeks   Status New     PT LONG TERM GOAL #5   Title Pt will demo proper core control in rotational movements to provide support to lumbar spine upon return to softball   Baseline will progress as tol, unaware of abdominal engagement at eval   Time 6   Period Weeks   Status New               Plan - 12/27/16 0930    Clinical Impression Statement Pt cont to demo significant improvements in strength in straight plane MMT. No evicence of pain via facial expressions during any exercises but pt reports fatigue, denies offers for water or rest breaks. Importance of mobility during the day was stressed to decrease stiffness. Wife reprots pt will sit for hours on the couch at home. Will cont to benefit from further skilled PT to appropraitely progress strength, flexibility and endurance as well as build appropraite HEP to continue after d/c.    PT Treatment/Interventions ADLs/Self Care Home Management;Cryotherapy;Electrical Stimulation;Functional mobility training;Stair training;Gait training;Moist Heat;Therapeutic activities;Therapeutic exercise;Traction;Ultrasound;Iontophoresis 4mg /ml Dexamethasone;Balance training;Neuromuscular re-education;Patient/family education;Passive range of motion;Manual techniques;Dry needling;Taping   PT Next Visit Plan standing balance   PT Home Exercise Plan thomas test stretch, seated HSS, figure 4, scapular retraction, wall slides, seated clam green tband   Consulted and Agree with Plan of Care Patient;Family  member/caregiver   Family Member Consulted Wife      Patient will benefit from skilled therapeutic intervention in order to improve the following deficits and impairments:  Increased muscle spasms, Decreased activity tolerance, Pain, Improper body mechanics, Impaired flexibility, Decreased strength, Postural dysfunction  Visit Diagnosis: Chronic midline low back pain without sciatica       G-Codes - 27-Dec-2016 0942    Functional Assessment Tool Used (Outpatient  Only) FOTO 60% ability (goal 54%), clinical judgement, reports of ability to independently exercises   Functional Limitation Mobility: Walking and moving around;Other PT subsequent   Mobility: Walking and Moving Around Goal Status [phone]) At least 40 percent but less than 60 percent impaired, limited or restricted   Mobility: Walking and Moving Around Discharge Status [phone]) At least 40 percent but less than 60 percent impaired, limited or restricted              Problem List Patient Active Problem List   Diagnosis Date Noted  . Type II or unspecified type diabetes mellitus without mention of complication, uncontrolled 06/28/2013  . Other and unspecified hyperlipidemia 06/28/2013    Yarden Manuelito C. Zayanna Pundt PT, DPT 12/22/16 9:45 AM   [facility] [address] [place], [name], [address] Phone: [phone]   Fax:  [phone]  Name: Ryan Gilmore MRN: [id number] Date of Birth: [birth year]

## 2016-12-25 ENCOUNTER — Encounter: Payer: Self-pay | Admitting: Physical Therapy

## 2016-12-25 ENCOUNTER — Ambulatory Visit: Payer: Medicare HMO | Admitting: Physical Therapy

## 2016-12-25 DIAGNOSIS — M545 Low back pain, unspecified: Secondary | ICD-10-CM

## 2016-12-25 DIAGNOSIS — G8929 Other chronic pain: Secondary | ICD-10-CM

## 2016-12-25 NOTE — Therapy (Signed)
[name], [name], [address] Phone: [phone]   Fax:  [phone]  Physical Therapy Treatment  Patient Details  Name: Ryan Gilmore MRN: [id number] Date of Birth: [birth year] Referring Provider: [name], MD  Encounter Date: 12/25/2016      PT End of Session - 12/25/16 0853    Visit Number 8   Number of Visits 13   Date for PT Re-Evaluation 01/09/17   Authorization Type [employer] MCR- KX at visit 15   PT Start Time 0846   PT Stop Time 0926   PT Time Calculation (min) 40 min   Activity Tolerance Patient tolerated treatment well   Behavior During Therapy [name] for tasks assessed/performed      Past Medical History:  Diagnosis Date  . Bleeding duodenal ulcer    ?  1980s  . Dementia   . Diabetes mellitus without complication ([place])   . Hyperlipidemia     Past Surgical History:  Procedure Laterality Date  . APPENDECTOMY      There were no vitals filed for this visit.      Subjective Assessment - 12/25/16 0848    Subjective Pt reports he is doing well, back stiffness is minimal. Pt wife reports MD saw a mass on his back that does not think is anything but is having a surgeon check on it. Wife also reports pt did not get up at all on Friday, pt reports "well, I was tired"    Patient Stated Goals play softball, stand from couch, decrease morning stiffness, raking leaves   Currently in Pain? No/denies                         [facility] Adult PT Treatment/Exercise - 12/25/16 0001      Lumbar Exercises: Aerobic   Stationary Bike nu step 5 min L7     Lumbar Exercises: Standing   Heel Raises 20 reps   Heel Raises Limitations with 3# fwd reach   Other Standing Lumbar Exercises UE PFN 3# weight  cues "get short, grow tall"     Lumbar Exercises: Seated   Sit to Stand 15 reps   Sit to Stand Limitations ball reach overhead with stand     Lumbar Exercises: Supine   Bridge 20 reps    Bridge Limitations blue tband   Straight Leg Raise 10 reps  each   Straight Leg Raises Limitations ball in opp UE fo reach     Lumbar Exercises: Sidelying   Clam 20 reps   Clam Limitations both     Knee/Hip Exercises: Stretches   Passive Hamstring Stretch Both;30 seconds   Passive Hamstring Stretch Limitations seated EOB   Hip Flexor Stretch Limitations thomas test 30s each   Gastroc Stretch 2 reps;30 seconds;Both   Gastroc Stretch Limitations slant board   Other Knee/Hip Stretches figure 4                PT Education - 12/25/16 [phone]    Education provided Yes   Education Details exercise form/rationale, discussing with wife so she is aware of what pt is capable of.    Person(s) Educated Patient;Spouse   Methods Explanation;Demonstration;Tactile cues;Verbal cues   Comprehension Verbalized understanding;Returned demonstration;Verbal cues required;Tactile cues required;Need further instruction          PT Short Term Goals - 12/22/16 9528      PT SHORT TERM GOAL #1   Title pt and wife  will report pt incorporating frequent walking throughout the day to break up long term sitting posture by 3/16   Baseline wife tries to encourage him but pt wants to "wait until later"     stressed importance of mobility   Status On-going     PT SHORT TERM GOAL #2   Title Pt will demo at least 3/5 glut strength in prone with knee flexed   Baseline see flowhseet   Status Achieved           PT Long Term Goals - 11/27/16 0850      PT LONG TERM GOAL #1   Title FOTO to 54% ability to indicate significant improvement in functional ability by 4/6   Baseline 40% ability at eval   Time 6   Period Weeks   Status New     PT LONG TERM GOAL #2   Title Pt will be independent with long term HEP and verbalize knowledge of stretching importance and necessary frequency   Baseline began educating at eval   Time 6   Period Weeks   Status New     PT LONG TERM GOAL #3   Title Pt will  demo at least 4+/5 strength in all hip MMT to indicate appropriate strength support to lumbo pelvic region   Baseline see flowsheet   Time 6   Period Weeks   Status New     PT LONG TERM GOAL #4   Title Pt will be able to get out of bed and stand from couch with minimal to no stiffness   Baseline severe reported at eval   Time 6   Period Weeks   Status New     PT LONG TERM GOAL #5   Title Pt will demo proper core control in rotational movements to provide support to lumbar spine upon return to softball   Baseline will progress as tol, unaware of abdominal engagement at eval   Time 6   Period Weeks   Status New               Plan - 12/25/16 0934    Clinical Impression Statement Brought pt wife back today so she could see what pt is capable of since pt states that he is too tired to exercise when at home. Disucssed importance of frequent mobility with a round of exercises once a day.    PT Next Visit Plan standing balance   PT Home Exercise Plan thomas test stretch, seated HSS, figure 4, scapular retraction, wall slides, seated clam green tband   Consulted and Agree with Plan of Care Patient;Family member/caregiver   Family Member Consulted Wife      Patient will benefit from skilled therapeutic intervention in order to improve the following deficits and impairments:     Visit Diagnosis: Chronic midline low back pain without sciatica     Problem List Patient Active Problem List   Diagnosis Date Noted  . Type II or unspecified type diabetes mellitus without mention of complication, uncontrolled 06/28/2013  . Other and unspecified hyperlipidemia 06/28/2013    Trenese Haft C. Elvy Mclarty PT, DPT 12/25/16 9:38 AM   [facility] [address] [place], [name], [address] Phone: [phone]   Fax:  [phone]  Name: Ryan Gilmore MRN: [id number] Date of Birth: [birth year]

## 2016-12-29 ENCOUNTER — Ambulatory Visit: Payer: Medicare HMO | Admitting: Physical Therapy

## 2016-12-29 DIAGNOSIS — D171 Benign lipomatous neoplasm of skin and subcutaneous tissue of trunk: Secondary | ICD-10-CM | POA: Diagnosis not present

## 2017-01-01 ENCOUNTER — Encounter: Payer: Self-pay | Admitting: Physical Therapy

## 2017-01-01 ENCOUNTER — Ambulatory Visit: Payer: Medicare HMO | Admitting: Physical Therapy

## 2017-01-01 DIAGNOSIS — G8929 Other chronic pain: Secondary | ICD-10-CM

## 2017-01-01 DIAGNOSIS — M545 Low back pain: Secondary | ICD-10-CM | POA: Diagnosis not present

## 2017-01-01 NOTE — Therapy (Signed)
[place], [name], [address] Phone: [phone]   Fax:  [phone]  Physical Therapy Treatment  Patient Details  Name: Ryan Gilmore MRN: [id number] Date of Birth: May 04, 1933 Referring Provider: [name], MD  Encounter Date: 01/01/2017      PT End of Session - 01/01/17 1851    Visit Number 9   Number of Visits 13   Date for PT Re-Evaluation 01/09/17   PT Start Time 2671   PT Stop Time 1100   PT Time Calculation (min) 45 min   Activity Tolerance Patient tolerated treatment well   Behavior During Therapy [facility] for tasks assessed/performed      Past Medical History:  Diagnosis Date  . Bleeding duodenal ulcer    ?  1980s  . Dementia   . Diabetes mellitus without complication ([place])   . Hyperlipidemia     Past Surgical History:  Procedure Laterality Date  . APPENDECTOMY      There were no vitals filed for this visit.      Subjective Assessment - 01/01/17 1023    Subjective I feel good this morning.  I want to play softball soon.  I catch.    Patient is accompained by: --  In Lobby.   Currently in Pain? No/denies   Pain Score 0-No pain   Pain Location Knee                         [facility] Adult PT Treatment/Exercise - 01/01/17 0001      High Level Balance   High Level Balance Activities --  cornerRhomboid on floor on pillow,  Tandem on floow,  both w   High Level Balance Comments Tai Chi moves forward and to the side with head and arm movements,    other balance exercises weight pull 4 way cable cross CGA,       Therapeutic Activites    Therapeutic Activities Other Therapeutic Activities   Other Therapeutic Activities Simulate catching for soft ball, Kneeling and half kneeling Rt knee on wedge Left foot on floor.      Lumbar Exercises: Standing   Heel Raises 10 reps  2 sets toe lifts smaller motions,  cues                  PT Short Term Goals -  12/22/16 0921      PT SHORT TERM GOAL #1   Title pt and wife will report pt incorporating frequent walking throughout the day to break up long term sitting posture by 3/16   Baseline wife tries to encourage him but pt wants to "wait until later"     stressed importance of mobility   Status On-going     PT SHORT TERM GOAL #2   Title Pt will demo at least 3/5 glut strength in prone with knee flexed   Baseline see flowhseet   Status Achieved           PT Long Term Goals - 11/27/16 0850      PT LONG TERM GOAL #1   Title FOTO to 54% ability to indicate significant improvement in functional ability by 4/6   Baseline 40% ability at eval   Time 6   Period Weeks   Status New     PT LONG TERM GOAL #2   Title Pt will be independent with long term HEP and verbalize knowledge of stretching importance and necessary frequency  Baseline began educating at eval   Time 6   Period Weeks   Status New     PT LONG TERM GOAL #3   Title Pt will demo at least 4+/5 strength in all hip MMT to indicate appropriate strength support to lumbo pelvic region   Baseline see flowsheet   Time 6   Period Weeks   Status New     PT LONG TERM GOAL #4   Title Pt will be able to get out of bed and stand from couch with minimal to no stiffness   Baseline severe reported at eval   Time 6   Period Weeks   Status New     PT LONG TERM GOAL #5   Title Pt will demo proper core control in rotational movements to provide support to lumbar spine upon return to softball   Baseline will progress as tol, unaware of abdominal engagement at eval   Time 6   Period Weeks   Status New               Plan - 01/01/17 1851    Clinical Impression Statement FOTO done.  See FOTO site for score.  balance focus today.  Activities were challanging. Brief rests were needed.  intermitant [place] relieved with sitting.    PT Next Visit Plan standing balance   PT Home Exercise Plan thomas test stretch, seated HSS, figure 4,  scapular retraction, wall slides, seated clam green tband   Consulted and Agree with Plan of Care Patient   Family Member Consulted Wife      Patient will benefit from skilled therapeutic intervention in order to improve the following deficits and impairments:  Increased muscle spasms, Decreased activity tolerance, Pain, Improper body mechanics, Impaired flexibility, Decreased strength, Postural dysfunction  Visit Diagnosis: Chronic midline low back pain without sciatica     Problem List Patient Active Problem List   Diagnosis Date Noted  . Type II or unspecified type diabetes mellitus without mention of complication, uncontrolled 06/28/2013  . Other and unspecified hyperlipidemia 06/28/2013    HARRIS,KAREN PTA 01/01/2017, 6:54 PM  [facility] [address] [place], [name], [address] Phone: [phone]   Fax:  [phone]  Name: Ryan Gilmore MRN: [id number] Date of Birth: 1933-05-28

## 2017-01-05 ENCOUNTER — Encounter: Payer: Self-pay | Admitting: Physical Therapy

## 2017-01-05 ENCOUNTER — Ambulatory Visit: Payer: Medicare HMO | Attending: Family Medicine | Admitting: Physical Therapy

## 2017-01-05 DIAGNOSIS — M545 Low back pain: Secondary | ICD-10-CM | POA: Insufficient documentation

## 2017-01-05 DIAGNOSIS — G8929 Other chronic pain: Secondary | ICD-10-CM | POA: Diagnosis not present

## 2017-01-05 NOTE — Therapy (Signed)
[place], [name], [address] Phone: [phone]   Fax:  [phone]  Physical Therapy Treatment  Patient Details  Name: Ryan Gilmore MRN: [id number] Date of Birth: September 29, 1933 Referring Provider: [name], MD  Encounter Date: 01/05/2017      PT End of Session - 01/05/17 1346    Visit Number 10   Number of Visits 13   Date for PT Re-Evaluation 01/09/17   PT Start Time 0847   PT Stop Time 0930   PT Time Calculation (min) 43 min   Activity Tolerance Patient tolerated treatment well   Behavior During Therapy [facility] for tasks assessed/performed      Past Medical History:  Diagnosis Date  . Bleeding duodenal ulcer    ?  1980s  . Dementia   . Diabetes mellitus without complication ([place])   . Hyperlipidemia     Past Surgical History:  Procedure Laterality Date  . APPENDECTOMY      There were no vitals filed for this visit.      Subjective Assessment - 01/05/17 0855    Subjective No pain.  I have not fallen.  I feel good.   Patient is accompained by: Family member  Wife in lobby   Currently in Pain? No/denies   Pain Location Knee                         [facility] Adult PT Treatment/Exercise - 01/05/17 0001      High Level Balance   High Level Balance Activities --  cornerRhomboid, Tandem , on floor/ pillow,  Close to CGA all   High Level Balance Comments arm and head movements..  6 feet bouts heel toe walking ,  step drill with 1/2 and full length steps. alternating,  difficult initially   Single leg each leg ball, toss/catch from trampoline each     Lumbar Exercises: Stretches   Active Hamstring Stretch Limitations 3 X 30 seconds.     Lumbar Exercises: Aerobic   Stationary Bike Nu step 6 minutes  645 steps     Lumbar Exercises: Supine   Clam 10 reps   Bridge 20 reps                  PT Short Term Goals - 01/05/17 1349      PT SHORT TERM GOAL #1   Title pt and wife will report pt incorporating frequent walking throughout the day to break up long term sitting posture by 3/16   Baseline Not a lot of exercise at home even with wife's encouragement.   Time 3   Period Weeks   Status On-going     PT SHORT TERM GOAL #2   Title Pt will demo at least 3/5 glut strength in prone with knee flexed   Time 3   Period Weeks   Status Achieved           PT Long Term Goals - 01/05/17 1350      PT LONG TERM GOAL #1   Title FOTO to 54% ability to indicate significant improvement in functional ability by 4/6   Baseline 83 % ability   Time 6   Period Weeks   Status Achieved     PT LONG TERM GOAL #2   Title Pt will be independent with long term HEP and verbalize knowledge of stretching importance and necessary frequency   Baseline not always compliant   Time  6   Period Weeks   Status On-going     PT LONG TERM GOAL #3   Title Pt will demo at least 4+/5 strength in all hip MMT to indicate appropriate strength support to lumbo pelvic region   Time 6   Period Weeks   Status Unable to assess     PT LONG TERM GOAL #4   Title Pt will be able to get out of bed and stand from couch with minimal to no stiffness   Baseline Extra time from chair or mat in clinic. From bed or couch not assessed.   Time 6   Period Weeks     PT LONG TERM GOAL #5   Title Pt will demo proper core control in rotational movements to provide support to lumbar spine upon return to softball   Baseline cues for this needed with catcher 1/2 kneeling practice.   Time 6   Period Weeks   Status On-going               Plan - 01/05/17 1347    Clinical Impression Statement 10th visit today reveals improving balance and decreased knee pain.  Patient shows balance skill learning with repetitive balance exercises.  Brief rests given as needed.   PT Next Visit Plan standing balance   PT Home Exercise Plan thomas test stretch, seated HSS, figure 4, scapular retraction,  wall slides, seated clam green tband   Consulted and Agree with Plan of Care Patient   Family Member Consulted Wife      Patient will benefit from skilled therapeutic intervention in order to improve the following deficits and impairments:     Visit Diagnosis: Chronic midline low back pain without sciatica         G-Codes - 01-07-17 0817    Functional Assessment Tool Used (Outpatient Only) clincial judgement, wife reports on ability to independently exercise   Functional Limitation Other PT subsequent   Other PT Secondary Current Status (N1916) At least 20 percent but less than 40 percent impaired, limited or restricted   Other PT Secondary Goal Status (O0600) At least 1 percent but less than 20 percent impaired, limited or restricted       Problem List Patient Active Problem List   Diagnosis Date Noted  . Type II or unspecified type diabetes mellitus without mention of complication, uncontrolled 06/28/2013  . Other and unspecified hyperlipidemia 06/28/2013    HARRIS,KAREN PTA 01/05/2017, 2:04 PM  [facility] [address] [place], [name], [address] Phone: [phone]   Fax:  [phone]  Name: Ryan Gilmore MRN: [id number] Date of Birth: [birth year]  Gay Filler. Hightower PT, DPT Jan 07, 2017 8:19 AM

## 2017-01-06 DIAGNOSIS — M545 Low back pain: Secondary | ICD-10-CM | POA: Diagnosis not present

## 2017-01-06 DIAGNOSIS — G8929 Other chronic pain: Secondary | ICD-10-CM | POA: Diagnosis not present

## 2017-01-08 ENCOUNTER — Encounter: Payer: Self-pay | Admitting: Physical Therapy

## 2017-01-08 ENCOUNTER — Ambulatory Visit: Payer: Medicare HMO | Admitting: Physical Therapy

## 2017-01-08 DIAGNOSIS — G8929 Other chronic pain: Secondary | ICD-10-CM | POA: Diagnosis not present

## 2017-01-08 DIAGNOSIS — M545 Low back pain: Secondary | ICD-10-CM | POA: Diagnosis not present

## 2017-01-08 NOTE — Therapy (Signed)
[place], [name], [address] Phone: [phone]   Fax:  [phone]  Physical Therapy Treatment  Patient Details  Name: Ryan Gilmore MRN: [id number] Date of Birth: 12-Aug-1933 Referring Provider: [name], MD  Encounter Date: 01/08/2017      PT End of Session - 01/08/17 0850    Visit Number 11   Number of Visits 13   Date for PT Re-Evaluation 01/09/17   Authorization Type [employer] MCR- KX at visit 15   PT Start Time 0848   PT Stop Time 0917   PT Time Calculation (min) 29 min   Activity Tolerance Patient tolerated treatment well   Behavior During Therapy [facility] for tasks assessed/performed      Past Medical History:  Diagnosis Date  . Bleeding duodenal ulcer    ?  1980s  . Dementia   . Diabetes mellitus without complication ([place])   . Hyperlipidemia     Past Surgical History:  Procedure Laterality Date  . APPENDECTOMY      There were no vitals filed for this visit.      Subjective Assessment - 01/08/17 0851    Subjective Pt reports he is "feeling fine".    Currently in Pain? No/denies   Aggravating Factors  denies any pain   Pain Relieving Factors exercises            [facility] PT Assessment - 01/08/17 0001      Assessment   Medical Diagnosis LBP   Referring Provider [name], MD     Observation/Other Assessments   Focus on Therapeutic Outcomes (FOTO)  83% ability     AROM   Lumbar Flexion 70  mild stiffness   Lumbar Extension 20     Strength   Right Hip Extension 5/5   Left Hip Extension 4+/5                     [facility] Adult PT Treatment/Exercise - 01/08/17 0001      Lumbar Exercises: Aerobic   Stationary Bike nu step L5 6 min     Knee/Hip Exercises: Stretches   Passive Hamstring Stretch Both;30 seconds   Passive Hamstring Stretch Limitations seated EOB   Hip Flexor Stretch Limitations thomas test 30s each   Gastroc Stretch 2 reps;30  seconds;Both   Gastroc Stretch Limitations slant board   Other Knee/Hip Stretches figure 4                PT Education - 01/08/17 [phone]    Education provided Yes   Education Details exercise form/rationale, progress toward goals, importance of long term HEP   Person(s) Educated Patient;Spouse   Methods Explanation;Demonstration;Tactile cues;Verbal cues   Comprehension Verbalized understanding;Returned demonstration;Verbal cues required;Tactile cues required          PT Short Term Goals - 01/05/17 1349      PT SHORT TERM GOAL #1   Title pt and wife will report pt incorporating frequent walking throughout the day to break up long term sitting posture by 3/16   Baseline Not a lot of exercise at home even with wife's encouragement.   Time 3   Period Weeks   Status On-going     PT SHORT TERM GOAL #2   Title Pt will demo at least 3/5 glut strength in prone with knee flexed   Time 3   Period Weeks   Status Achieved  PT Long Term Goals - Jan 31, 2017 0858      PT LONG TERM GOAL #1   Title FOTO to 54% ability to indicate significant improvement in functional ability by 4/6   Baseline 83 % ability   Status Achieved     PT LONG TERM GOAL #2   Title Pt will be independent with long term HEP and verbalize knowledge of stretching importance and necessary frequency   Baseline pt verbalized knowledge, wife reports pt is not always compliant; wife educated on exercises and provided with printouts due to pt dementia   Status Partially Met     PT LONG TERM GOAL #3   Title Pt will demo at least 4+/5 strength in all hip MMT to indicate appropriate strength support to lumbo pelvic region   Baseline see flowsheet   Status Achieved     PT LONG TERM GOAL #4   Title Pt will be able to get out of bed and stand from couch with minimal to no stiffness   Baseline verbalized feeling fine to stand from chair and mat in clinic, wife reports c/o stiffness at home when he does not do  his stretches or he has been sitting for a few hours   Status Partially Met     PT LONG TERM GOAL #5   Title Pt will demo proper core control in rotational movements to provide support to lumbar spine upon return to softball   Baseline able to verbalize feeling abdominal wall without cuing, some cuing required for proper form when changing activities with trunk rotation   Status Partially Met               Plan - Jan 31, 2017 0907    Clinical Impression Statement Pt has demo increased strength and flexibility since beginning PT, showing good carryover with activities and activity tolerance. Pt and wif educated on importance of long term stretching and avoiding sitting for more than 1 hour at a time to decrease stiffness. Wife present for education due to pt memory limitations. Pt is d/c to independent program at this time and was instructed to contact us with any further questions.    PT Treatment/Interventions ADLs/Self Care Home Management;Cryotherapy;Electrical Stimulation;Functional mobility training;Stair training;Gait training;Moist Heat;Therapeutic activities;Therapeutic exercise;Traction;Ultrasound;Iontophoresis 64m/ml Dexamethasone;Balance training;Neuromuscular re-education;Patient/family education;Passive range of motion;Manual techniques;Dry needling;Taping   Consulted and Agree with Plan of Care Patient;Family member/caregiver   Family Member Consulted Wife      Patient will benefit from skilled therapeutic intervention in order to improve the following deficits and impairments:  Increased muscle spasms, Decreased activity tolerance, Pain, Improper body mechanics, Impaired flexibility, Decreased strength, Postural dysfunction  Visit Diagnosis: Chronic midline low back pain without sciatica       G-Codes - 004/28/[id number]   Functional Assessment Tool Used (Outpatient Only) clincial judgement, wife reports on ability to independently exercise   Functional Limitation Other PT  subsequent   Other PT Secondary Goal Status ((H7342 At least 1 percent but less than 20 percent impaired, limited or restricted   Other PT Secondary Discharge Status ((A7681 At least 1 percent but less than 20 percent impaired, limited or restricted      Problem List Patient Active Problem List   Diagnosis Date Noted  . Type II or unspecified type diabetes mellitus without mention of complication, uncontrolled 06/28/2013  . Other and unspecified hyperlipidemia 06/28/2013   Noreen Mackintosh C. Alethia Melendrez PT, DPT 004/28/20189:19 AM   [place]  [address] Phone: [phone]   Fax:  [phone]  Name: Ryan Gilmore MRN: [id number] Date of Birth: Sep 03, 1933

## 2017-02-12 ENCOUNTER — Other Ambulatory Visit: Payer: Self-pay | Admitting: Family Medicine

## 2017-02-16 ENCOUNTER — Other Ambulatory Visit: Payer: Self-pay | Admitting: Family Medicine

## 2017-02-17 ENCOUNTER — Other Ambulatory Visit: Payer: Self-pay | Admitting: Family Medicine

## 2017-02-17 MED ORDER — METFORMIN HCL 500 MG PO TABS: ORAL_TABLET | ORAL | 3 refills | Status: DC

## 2017-02-19 DIAGNOSIS — H40013 Open angle with borderline findings, low risk, bilateral: Secondary | ICD-10-CM | POA: Diagnosis not present

## 2017-03-02 ENCOUNTER — Other Ambulatory Visit: Payer: Self-pay | Admitting: Family Medicine

## 2017-04-28 ENCOUNTER — Other Ambulatory Visit: Payer: Self-pay | Admitting: Family Medicine

## 2017-06-18 ENCOUNTER — Encounter: Payer: Self-pay | Admitting: Family Medicine

## 2017-06-18 ENCOUNTER — Ambulatory Visit (INDEPENDENT_AMBULATORY_CARE_PROVIDER_SITE_OTHER): Payer: Medicare HMO | Admitting: Family Medicine

## 2017-06-18 VITALS — BP 122/78 | HR 80 | Temp 97.8°F | Resp 16 | Ht 70.5 in | Wt 196.0 lb

## 2017-06-18 DIAGNOSIS — Z23 Encounter for immunization: Secondary | ICD-10-CM | POA: Diagnosis not present

## 2017-06-18 DIAGNOSIS — E119 Type 2 diabetes mellitus without complications: Secondary | ICD-10-CM

## 2017-06-18 NOTE — Progress Notes (Signed)
Subjective:    Patient ID: Ryan Gilmore, male    DOB: 1932-11-20, 83 y.o.   MRN: [id number]  Back Pain    11/11/16 Patient is here today complaining of low back pain for more than a year. Pain is located in the lower lumbar spine around the level of L4-L5. There is no radiation into his hips or down his legs. He denies any weakness or numbness in his legs. He denies any paresthesias in his legs. He has normal reflexes 2 over 4 equal and symmetric bilaterally at the knee as well as at the Achilles. He has no tenderness to palpation over the spinous processes of the lumbar spine. There are no palpable muscle spasms. There is no visible deformity. The pain is a dull ache. It is worse in the morning and improves throughout the day as he is out walking around.  At that time, my plan was: Exam is unremarkable. I will check an x-ray of the lumbar spine. I suspect degenerative disc disease is most likely cause combined with likely arthritis. Depending on the results of x-ray, future treatment options include NSAIDs versus physical therapy  11/24/16 Xray revealed: There is no evidence of lumbar spine fracture. Alignment is normal. Intervertebral disc spaces are narrowed at L3-4 and L4-5. Sacroiliac joints are unremarkable bilaterally. Multilevel facet joint sclerosis and joint space narrowing most prominent at L4-5 and L5-S1. No spondylolysis nor spondylolisthesis.    He is interested in options to help manage his chronic low back pain. He is also noticed a mass in the center of his back. Is approximately 3.5 cmx5 cm.  It is soft. It is mobile. It appears to be a lipoma.    At that time, my plan was: Patient like to see a general surgeon to have the lipoma removed. I will consult physical therapy for his chronic low back pain secondary to degenerative disc disease   12/12/16 He is here today for follow-up of his diabetes, hyperlipidemia, and dementia. He is also following up his low back pain which  I'm treating him with meloxicam and referral to physical therapy and meloxicam.  Fortunately, he has not required meloxicam in several days. Both he and his wife believe that physical therapy is helping. Rechecking his blood sugars but when they do check his blood sugar tends to be between 130 and 150. They deny any hypoglycemia. He denies any polyuria polydipsia or blurred vision. He is not on an ACE inhibitor. I am not sure why. He denies any previous medications causing a cough or having any swelling in the face or lips. He would certainly benefit from this for renal protection as well as cardiovascular protection. He is taking pravastatin. He denies any myalgias or right upper quadrant pain. He is due for diabetic foot exam. He denies any neuropathy in the feet. His dementia appears stable. I'm starting to be concerned about his wife. She recently missed an appointment with the surgeon that I schedule for a lipoma removal. She also seems a little bit confused when I ask him about his medications. There may be some early memory loss and her as well.  At that time, my plan was: Back pain seems better on physical therapy. We'll continue this at the present time. I warned the patient and his wife about GI toxicity from NSAIDs. They will try to use the medicine as sparingly as possible. Previously his diabetes was out of control. I will recheck a hemoglobin A1c today. I will be  very happy with this patient with an A1c between 7 and 8 given his age and his dementia. Diabetic foot exam is performed today and is completely normal. I will check a microalbumin. However I will go ahead and add lisinopril 10 mg a day for renal protection. I hesitate at a higher dose because the patient actually has very good blood pressure and on over the dizziness. I will also check a fasting lipid panel. Goal LDL cholesterol is less than 100. Patient is not taking a baby aspirin. I recommended that he start taking aspirin 81 mg a day.  His dementia seems stable. However I need to watch his wife more closely as unfortunately she may be showing early signs. Has had pneumovax 23.  Received prevnar 13 today.  Recommended annual eye exam.    06/18/17 Patient presents with elevated blood sugars in the morning. His wife states that over the last month, his fasting blood sugars have all been greater than 200. He is not checking his sugars in the evening. He denies any polyuria, polydipsia, or blurry vision. He continues to complain of low back pain. Originally we had tried physical therapy which the patient's wife states made his back feel much better. However they discontinued physical therapy and his back pain has returned. He primarily complains of stiffness in the middle of his lower back around the level of L4-L5. There is no radiation of the pain into his legs. There is no numbness or tingling in his legs. Past Medical History:  Diagnosis Date  . Bleeding duodenal ulcer    ?  1980s  . Dementia   . Diabetes mellitus without complication ([place])   . Hyperlipidemia    Past Surgical History:  Procedure Laterality Date  . APPENDECTOMY     . Current Outpatient Prescriptions on File Prior to Visit  Medication Sig Dispense Refill  . donepezil (ARICEPT) 5 MG tablet TAKE ONE TABLET BY MOUTH AT BEDTIME 90 tablet 3  . glimepiride (AMARYL) 4 MG tablet TAKE ONE TABLET BY MOUTH ONCE DAILY 90 tablet 1  . glucose blood (ACCU-CHEK SMARTVIEW) test strip Use as instructed to check blood sugar 2 times per day dx code E11.65 100 each 5  . Insulin Pen Needle (BD PEN NEEDLE NANO U/F) 32G X 4 MM MISC Use 1 per day to inject insulin 30 each 3  . lisinopril (PRINIVIL,ZESTRIL) 10 MG tablet Take 1 tablet (10 mg total) by mouth daily. 90 tablet 3  . meloxicam (MOBIC) 15 MG tablet TAKE 1 TABLET BY MOUTH  DAILY 30 tablet 2  . metFORMIN (GLUCOPHAGE) 500 MG tablet TAKE TWO TABLETS BY MOUTH TWICE DAILY WITH  A  MEAL 360 tablet 3  . pioglitazone (ACTOS) 30 MG  tablet TAKE ONE TABLET BY MOUTH ONCE DAILY 90 tablet 1  . pravastatin (PRAVACHOL) 40 MG tablet Take 1 tablet (40 mg total) by mouth daily. 90 tablet 2  . VICTOZA 18 MG/3ML SOPN Inject 0.3 mLs (1.8 mg total) into the skin daily. Inject once daily at the same time 3 pen 5   No current facility-administered medications on file prior to visit.    No Known Allergies Social History   Social History  . Marital status: Married    Spouse name: N/A  . Number of children: N/A  . Years of education: N/A   Occupational History  . Not on file.   Social History Main Topics  . Smoking status: Never Smoker  . Smokeless tobacco: Current User  Types: Chew  . Alcohol use Not on file  . Drug use: Unknown  . Sexual activity: Not on file   Other Topics Concern  . Not on file   Social History Narrative  . No narrative on file   Family History  Problem Relation Age of Onset  . Heart disease Father   . Diabetes Brother       Review of Systems  Musculoskeletal: Positive for back pain.  All other systems reviewed and are negative.      Objective:   Physical Exam  Constitutional: He is oriented to person, place, and time. He appears well-developed and well-nourished. No distress.  HENT:  Right Ear: External ear normal.  Left Ear: External ear normal.  Nose: Nose normal.  Mouth/Throat: Oropharynx is clear and moist. No oropharyngeal exudate.  Eyes: Pupils are equal, round, and reactive to light. Conjunctivae and EOM are normal. Right eye exhibits no discharge. Left eye exhibits no discharge. No scleral icterus.  Neck: Normal range of motion. Neck supple. No JVD present. No tracheal deviation present. No thyromegaly present.  Cardiovascular: Normal rate, regular rhythm, normal heart sounds and intact distal pulses.  Exam reveals no gallop and no friction rub.   No murmur heard. Pulmonary/Chest: Effort normal and breath sounds normal. No stridor. No respiratory distress. He has no  wheezes. He has no rales. He exhibits no tenderness.  Abdominal: Soft. Bowel sounds are normal. He exhibits no distension and no mass. There is no tenderness. There is no rebound and no guarding.  Musculoskeletal: Normal range of motion. He exhibits no edema, tenderness or deformity.       Lumbar back: He exhibits normal range of motion, no tenderness, no bony tenderness, no pain, no spasm and normal pulse.  Lymphadenopathy:    He has no cervical adenopathy.  Neurological: He is alert and oriented to person, place, and time. He has normal reflexes. No cranial nerve deficit. He exhibits normal muscle tone. Coordination normal.  Skin: Skin is warm. No rash noted. He is not diaphoretic. No erythema. No pallor.  Vitals reviewed.         Assessment & Plan:   Controlled type 2 diabetes mellitus without complication, without long-term current use of insulin ([place]) - Plan: COMPLETE METABOLIC PANEL WITH GFR, Hemoglobin A1c  Need for immunization against influenza - Plan: Flu Vaccine QUAD 36+ mos IM  Based on his fasting blood sugar, I suspect that his diabetes is out of control. I will check a hemoglobin A1c to confirm. If hemoglobin A1c is greater than 7, I would recommend starting the patient on Jardiance.  I believe he is a poor candidate for insulin given his memory loss. Of note, the patient's memory loss seems worse today as he is slightly more confused and slow to respond to questions. I believe his low back pain is likely secondary to degenerative disc disease. At the present time, his symptoms do not suggest a surgical cause for low back pain. Therefore I recommended physical therapy again. They deferred that at the present time.

## 2017-06-19 LAB — COMPLETE METABOLIC PANEL WITH GFR
AG RATIO: 2.3 (calc) (ref 1.0–2.5)
ALT: 13 U/L (ref 9–46)
AST: 17 U/L (ref 10–35)
Albumin: 4.4 g/dL (ref 3.6–5.1)
Alkaline phosphatase (APISO): 36 U/L — ABNORMAL LOW (ref 40–115)
BILIRUBIN TOTAL: 0.4 mg/dL (ref 0.2–1.2)
BUN: 9 mg/dL (ref 7–25)
CHLORIDE: 101 mmol/L (ref 98–110)
CO2: 25 mmol/L (ref 20–32)
Calcium: 9.3 mg/dL (ref 8.6–10.3)
Creat: 0.9 mg/dL (ref 0.70–1.11)
GFR, EST NON AFRICAN AMERICAN: 79 mL/min/{1.73_m2} (ref >=60)
GFR, Est African American: 92 mL/min/{1.73_m2} (ref >=60)
GLOBULIN: 1.9 g/dL (ref 1.9–3.7)
Glucose, Bld: 129 mg/dL — ABNORMAL HIGH (ref 65–99)
POTASSIUM: 4.8 mmol/L (ref 3.5–5.3)
SODIUM: 140 mmol/L (ref 135–146)
Total Protein: 6.3 g/dL (ref 6.1–8.1)

## 2017-06-19 LAB — HEMOGLOBIN A1C
EAG (MMOL/L): 9.2 (calc)
Hgb A1c MFr Bld: 7.4 % of total Hgb — ABNORMAL HIGH (ref <5.7)
MEAN PLASMA GLUCOSE: 166 (calc)

## 2017-06-19 LAB — SPECIMEN COMPROMISED

## 2017-06-23 ENCOUNTER — Other Ambulatory Visit: Payer: Self-pay

## 2017-06-23 MED ORDER — METFORMIN HCL 500 MG PO TABS: ORAL_TABLET | ORAL | 3 refills | Status: DC

## 2017-06-25 ENCOUNTER — Other Ambulatory Visit: Payer: Self-pay | Admitting: Family Medicine

## 2017-06-25 ENCOUNTER — Telehealth: Payer: Self-pay | Admitting: *Deleted

## 2017-06-25 DIAGNOSIS — G8929 Other chronic pain: Secondary | ICD-10-CM

## 2017-06-25 DIAGNOSIS — M545 Low back pain, unspecified: Secondary | ICD-10-CM

## 2017-06-25 NOTE — Telephone Encounter (Signed)
Referral placed.  Left mess for pt telling him that and to expect call from them to schdeule

## 2017-06-25 NOTE — Telephone Encounter (Signed)
Received call from patient wife, Ryan Gilmore.   Reports that he continues to have severe back pain as discussed in last OV. Requested referral to orthopedics.   MD please advise.

## 2017-06-25 NOTE — Telephone Encounter (Signed)
We'll gladly consult orthopedics.

## 2017-07-09 ENCOUNTER — Ambulatory Visit (INDEPENDENT_AMBULATORY_CARE_PROVIDER_SITE_OTHER): Payer: Medicare HMO | Admitting: Orthopedic Surgery

## 2017-07-09 ENCOUNTER — Encounter (INDEPENDENT_AMBULATORY_CARE_PROVIDER_SITE_OTHER): Payer: Self-pay | Admitting: Orthopedic Surgery

## 2017-07-09 DIAGNOSIS — M545 Low back pain: Secondary | ICD-10-CM | POA: Diagnosis not present

## 2017-07-09 DIAGNOSIS — G8929 Other chronic pain: Secondary | ICD-10-CM | POA: Diagnosis not present

## 2017-07-09 MED ORDER — PREDNISONE 50 MG PO TABS: ORAL_TABLET | ORAL | 0 refills | Status: DC

## 2017-07-09 NOTE — Progress Notes (Signed)
Office Visit Note   Patient: Ryan Gilmore           Date of Birth: 1933/05/14           MRN: [id number] Visit Date: 07/09/2017              Requested by: [name], MD [address] [place], [place] [address] PCP: [name], MD  Chief Complaint  Patient presents with  . Lower Back - Pain      HPI:  The patient is an 83 year old gentleman seen today forevalution of chronic low back pin ths is been ongoig sice February of this year. States is worse with bending and lifting. Has not been having any difficulty sleeping. He had been taking meloxicam and Aleve his primary care provider recommended he stop the Aleve. Is currently taking meloxicam and Tylenol daily without relief.  Did complete 6 weeks of physical therapy earlier this year states this was helpful while doing it however states therapy ended had return of symptoms.  Assessment & Plan: Visit Diagnoses:  1. Chronic midline low back pain without sciatica     Plan:  L3 through L5 degenerative disc Disease. Will provide a prednisone taper. He'll follow-up in office following this. Consider referral to [facility] should pred not help.   Follow-Up Instructions: Return in about 3 weeks (around 07/30/2017), or if symptoms worsen or fail to improve.   Back Exam   Tenderness  The patient is experiencing tenderness in the lumbar.  Range of Motion  The patient has normal back ROM.  Muscle Strength  The patient has normal back strength.  Tests  Straight leg raise right: positive Straight leg raise left: positive  Other  Gait: normal       Patient is alert, oriented, no adenopathy, well-dressed, normal affect, normal respiratory effort.   Imaging: No results found. No images are attached to the encounter.  IMPRESSION: No acute osseous abnormality. L3 through L5 degenerative disc disease. Multilevel degenerative facet arthropathy most prominent at L4-5 and L5-S1.  Labs: Lab Results    Component Value Date   HGBA1C 7.4 (H) 06/18/2017   HGBA1C 7.1 (H) 12/12/2016   HGBA1C 7.7 (H) 09/01/2016    Orders:  No orders of the defined types were placed in this encounter.  Meds ordered this encounter  Medications  . predniSONE (DELTASONE) 50 MG tablet    Sig: Take one tablet daily x 5 days    Dispense:  5 tablet    Refill:  0     Procedures: No procedures performed  Clinical Data: No additional findings.  ROS:  All other systems negative, except as noted in the HPI. Review of Systems  Constitutional: Negative for chills and fever.  Musculoskeletal: Positive for back pain.  Neurological: Negative for weakness and numbness.    Objective: Vital Signs: There were no vitals taken for this visit.  Specialty Comments:  No specialty comments available.  PMFS History: Patient Active Problem List   Diagnosis Date Noted  . Type II or unspecified type diabetes mellitus without mention of complication, uncontrolled 06/28/2013  . Other and unspecified hyperlipidemia 06/28/2013   Past Medical History:  Diagnosis Date  . Bleeding duodenal ulcer    ?  1980s  . Dementia   . Diabetes mellitus without complication ([place])   . Hyperlipidemia     Family History  Problem Relation Age of Onset  . Heart disease Father   . Diabetes Brother  Past Surgical History:  Procedure Laterality Date  . APPENDECTOMY     Social History   Occupational History  . Not on file.   Social History Main Topics  . Smoking status: Never Smoker  . Smokeless tobacco: Current User    Types: Chew  . Alcohol use Not on file  . Drug use: Unknown  . Sexual activity: Not on file

## 2017-08-25 ENCOUNTER — Encounter: Payer: Self-pay | Admitting: Family Medicine

## 2017-08-25 DIAGNOSIS — H40013 Open angle with borderline findings, low risk, bilateral: Secondary | ICD-10-CM | POA: Diagnosis not present

## 2017-08-25 DIAGNOSIS — E119 Type 2 diabetes mellitus without complications: Secondary | ICD-10-CM | POA: Diagnosis not present

## 2017-08-25 DIAGNOSIS — H2513 Age-related nuclear cataract, bilateral: Secondary | ICD-10-CM | POA: Diagnosis not present

## 2017-08-25 DIAGNOSIS — H25013 Cortical age-related cataract, bilateral: Secondary | ICD-10-CM | POA: Diagnosis not present

## 2017-08-25 LAB — HM DIABETES EYE EXAM

## 2017-09-01 ENCOUNTER — Other Ambulatory Visit: Payer: Self-pay | Admitting: Family Medicine

## 2017-10-05 ENCOUNTER — Other Ambulatory Visit: Payer: Self-pay | Admitting: Family Medicine

## 2017-10-12 ENCOUNTER — Ambulatory Visit (INDEPENDENT_AMBULATORY_CARE_PROVIDER_SITE_OTHER): Payer: Medicare HMO | Admitting: Orthopaedic Surgery

## 2017-10-12 ENCOUNTER — Encounter (INDEPENDENT_AMBULATORY_CARE_PROVIDER_SITE_OTHER): Payer: Self-pay | Admitting: Orthopaedic Surgery

## 2017-10-12 ENCOUNTER — Ambulatory Visit (INDEPENDENT_AMBULATORY_CARE_PROVIDER_SITE_OTHER): Payer: Medicare HMO

## 2017-10-12 DIAGNOSIS — M545 Low back pain: Secondary | ICD-10-CM

## 2017-10-12 DIAGNOSIS — G8929 Other chronic pain: Secondary | ICD-10-CM | POA: Diagnosis not present

## 2017-10-12 NOTE — Progress Notes (Signed)
Office Visit Note   Patient: Ryan Gilmore           Date of Birth: 02-28-1933           MRN: [id number] Visit Date: 10/12/2017              Requested by: [name], MD [address] [place], [place] [address] PCP: [name], MD   Assessment & Plan: Visit Diagnoses:  1. Chronic midline low back pain without sciatica     Plan: Apparently he is already had physical therapy for his lumbar spine as well.  I agree with the lumbar support brace if that is helping him and I do feel that the family can try Aspercreme with lidocaine.  He is a good candidate for sending him to Dr. Ernestina Patches to consider bilateral L4-L5 facet joint injections versus L5-S1.  I think is what is going to help him the most.  I will see him back myself in 4 weeks and hopefully unremarkable without an intervention by Dr. Ernestina Patches and this will hopefully provide a lot of relief.  Follow-Up Instructions: Return in about 4 weeks (around 11/09/2017).   Orders:  Orders Placed This Encounter  Procedures  . XR Lumbar Spine 2-3 Views   No orders of the defined types were placed in this encounter.     Procedures: No procedures performed   Clinical Data: No additional findings.   Subjective: Chief Complaint  Patient presents with  . Lower Back - Pain  The patient is someone I am seeing for the first time.  He comes in with chief complaint of low back pain with no radicular symptoms.  It is only in the lowest aspect of his lumbar spine with flexion extension.  He stands with a bent over posture.  He is a diabetic but has no neuropathy and denies any weakness in his legs.  He denies any change in bowel bladder function.  He denies any radiating symptoms other than pain just across his low back.  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  He does wear a lumbar back support brace.  He is on meloxicam as an anti-inflammatory.  HPI  Review of Systems He currently  denies any headache, chest pain, shortness of breath, fever, chills, nausea, vomiting.  He is alert and oriented x3 and in no acute distress  Objective: Vital Signs: There were no vitals taken for this visit.  Physical Exam Is alert and oriented x3 and in no acute distress Ortho Exam Examination of his lumbar spine she has pain in the midline and around the facet joints of the lower aspect of the lumbar spine.  He has 5 out of 5 strength in his bilateral lower extremities and normal sensation.  His reflexes are normal as well.  He can bend over and almost touch his toes.  His extension of his lumbar spine is more limited.  He does walk leaning forward. Specialty Comments:  No specialty comments available.  Imaging: Xr Lumbar Spine 2-3 Views  Result Date: 10/12/2017 2 views of the lumbar spine show no acute findings in terms of fractures or malalignment.  There is degenerative changes but is only mild to moderate at several levels but disc heights are decently maintained and alignment is decently maintained.    PMFS History: Patient Active Problem List   Diagnosis Date Noted  . Chronic midline low back pain without  sciatica 10/12/2017  . Type II or unspecified type diabetes mellitus without mention of complication, uncontrolled 06/28/2013  . Other and unspecified hyperlipidemia 06/28/2013   Past Medical History:  Diagnosis Date  . Bleeding duodenal ulcer    ?  1980s  . Dementia   . Diabetes mellitus without complication ([place])   . Hyperlipidemia     Family History  Problem Relation Age of Onset  . Heart disease Father   . Diabetes Brother     Past Surgical History:  Procedure Laterality Date  . APPENDECTOMY     Social History   Occupational History  . Not on file  Tobacco Use  . Smoking status: Never Smoker  . Smokeless tobacco: Current User    Types: Chew  Substance and Sexual Activity  . Alcohol use: Not on file  . Drug use: Not on file  . Sexual activity: Not on  file

## 2017-10-13 ENCOUNTER — Other Ambulatory Visit (INDEPENDENT_AMBULATORY_CARE_PROVIDER_SITE_OTHER): Payer: Self-pay

## 2017-10-13 DIAGNOSIS — M545 Low back pain: Principal | ICD-10-CM

## 2017-10-13 DIAGNOSIS — G8929 Other chronic pain: Secondary | ICD-10-CM

## 2017-10-20 ENCOUNTER — Other Ambulatory Visit: Payer: Self-pay | Admitting: Family Medicine

## 2017-10-20 ENCOUNTER — Telehealth: Payer: Self-pay | Admitting: Family Medicine

## 2017-10-20 MED ORDER — TRAMADOL HCL 50 MG PO TABS: 50.0000 mg | ORAL_TABLET | Freq: Three times a day (TID) | ORAL | 0 refills | Status: DC | PRN

## 2017-10-20 NOTE — Telephone Encounter (Signed)
Pt's wife called and LMOVM stating that pt is having a lot of back pain and they did go see the ortho and he recommeded injections in his back but they have not called him yet for that appt. She is wondering what he can do in the meantime for his pain?

## 2017-10-20 NOTE — Telephone Encounter (Signed)
Already sent to pharmacy in his chart not sure where that was.

## 2017-10-20 NOTE — Telephone Encounter (Signed)
She needs to call and verify the plan with Dr. Ninfa Linden.  Meanwhile, I will give him tramadol for pain to use sparingly.

## 2017-10-20 NOTE — Telephone Encounter (Signed)
Pt's wife aware of recommendations and will call them tomorrow and she would like the Tramadol sent to [facility].

## 2017-10-22 ENCOUNTER — Encounter: Payer: Self-pay | Admitting: Family Medicine

## 2017-10-22 ENCOUNTER — Ambulatory Visit (INDEPENDENT_AMBULATORY_CARE_PROVIDER_SITE_OTHER): Payer: Medicare HMO | Admitting: Family Medicine

## 2017-10-22 VITALS — BP 110/70 | HR 80 | Temp 98.6°F | Resp 18 | Ht 70.5 in | Wt 184.0 lb

## 2017-10-22 DIAGNOSIS — G8929 Other chronic pain: Secondary | ICD-10-CM | POA: Diagnosis not present

## 2017-10-22 DIAGNOSIS — M545 Low back pain, unspecified: Secondary | ICD-10-CM

## 2017-10-22 NOTE — Progress Notes (Signed)
Subjective:    Patient ID: Ryan Gilmore, male    DOB: 1933-06-28, 84 y.o.   MRN: [id number]  HPI Patient presents today with his wife to discuss his chronic low back pain.  Patient saw his orthopedist approximately 10 days ago and I have recommended bilateral L4-L5 facet joint injections as well as L5-S1 facet joint injections due to his degenerative disc disease to see if that will help with his low back pain.  [name] treatment is complicated by his underlying dementia as well.  He continues to endorse low back pain.  His wife states that he is in misery.  He primarily sits in a chair unwilling to stand or move due to the pain.  He points to an area just lateral to his lumbar spine to the right side right above the SI joint.  There is no tenderness to palpation in that area.  He does report decreased range of motion.  He denies any sciatica or leg weakness.  He is tried and failed physical therapy.  I did give him a prescription for tramadol 50 mg tablets to take every 8 hours as needed.  Wife has been given it to him every 8 hours and states that it helped some occasionally and at other times provided no relief. Past Medical History:  Diagnosis Date  . Bleeding duodenal ulcer    ?  1980s  . Dementia   . Diabetes mellitus without complication ([place])   . Hyperlipidemia    Past Surgical History:  Procedure Laterality Date  . APPENDECTOMY     Current Outpatient Medications on File Prior to Visit  Medication Sig Dispense Refill  . donepezil (ARICEPT) 5 MG tablet TAKE ONE TABLET BY MOUTH AT BEDTIME 90 tablet 3  . glimepiride (AMARYL) 4 MG tablet TAKE 1 TABLET BY MOUTH ONCE DAILY 90 tablet 1  . glucose blood (ACCU-CHEK SMARTVIEW) test strip Use as instructed to check blood sugar 2 times per day dx code E11.65 100 each 5  . Insulin Pen Needle (BD PEN NEEDLE NANO U/F) 32G X 4 MM MISC Use 1 per day to inject insulin 30 each 3  . lisinopril (PRINIVIL,ZESTRIL) 10 MG tablet Take 1 tablet (10 mg  total) by mouth daily. 90 tablet 3  . meloxicam (MOBIC) 15 MG tablet TAKE 1 TABLET BY MOUTH  DAILY 30 tablet 2  . metFORMIN (GLUCOPHAGE) 500 MG tablet TAKE TWO TABLETS BY MOUTH TWICE DAILY WITH  A  MEAL 360 tablet 3  . pioglitazone (ACTOS) 30 MG tablet TAKE 1 TABLET BY MOUTH ONCE DAILY 90 tablet 1  . pravastatin (PRAVACHOL) 40 MG tablet Take 1 tablet (40 mg total) by mouth daily. 90 tablet 2  . traMADol (ULTRAM) 50 MG tablet Take 1 tablet (50 mg total) by mouth every 8 (eight) hours as needed. 30 tablet 0  . VICTOZA 18 MG/3ML SOPN Inject 0.3 mLs (1.8 mg total) into the skin daily. Inject once daily at the same time 3 pen 5   No current facility-administered medications on file prior to visit.    No Known Allergies Social History   Socioeconomic History  . Marital status: Married    Spouse name: Not on file  . Number of children: Not on file  . Years of education: Not on file  . Highest education level: Not on file  Social Needs  . Financial resource strain: Not on file  . Food insecurity - worry: Not on file  . Food insecurity - inability: Not  on file  . Transportation needs - medical: Not on file  . Transportation needs - non-medical: Not on file  Occupational History  . Not on file  Tobacco Use  . Smoking status: Never Smoker  . Smokeless tobacco: Current User    Types: Chew  Substance and Sexual Activity  . Alcohol use: Not on file  . Drug use: Not on file  . Sexual activity: Not on file  Other Topics Concern  . Not on file  Social History Narrative  . Not on file      Review of Systems  All other systems reviewed and are negative.      Objective:   Physical Exam  Cardiovascular: Normal rate, regular rhythm and normal heart sounds.  Pulmonary/Chest: Effort normal and breath sounds normal. No respiratory distress. He has no wheezes. He has no rales.  Musculoskeletal:       Lumbar back: He exhibits decreased range of motion, tenderness and pain. He exhibits no  bony tenderness, no swelling, no edema, no deformity and no spasm.       Back:  Vitals reviewed.         Assessment & Plan:  Chronic low back pain without sciatica, unspecified back pain laterality  I explained to the patient and his wife that I was concerned about increasing the strength of his pain medication possibly exacerbating his dementia and also increasing his fall risk.  I explained to his wife that she could use the tramadol every 6 hours as needed.  If he is having severe pain, she did could increase to 100 mg sparingly.  I cautioned her against regular use unless the pain is moderate to severe due to the increased risk of falls and confusion area he has a cortisone injection scheduled for 10 days from now.  Hopefully that will provide him relief as well

## 2017-10-24 ENCOUNTER — Encounter (HOSPITAL_COMMUNITY): Payer: Self-pay

## 2017-10-24 ENCOUNTER — Other Ambulatory Visit: Payer: Self-pay

## 2017-10-24 ENCOUNTER — Emergency Department (HOSPITAL_COMMUNITY)
Admission: EM | Admit: 2017-10-24 | Discharge: 2017-10-25 | Disposition: A | Discharge status: Home / Self Care | Payer: Medicare HMO | Attending: Emergency Medicine | Admitting: Emergency Medicine

## 2017-10-24 ENCOUNTER — Emergency Department (HOSPITAL_COMMUNITY): Payer: Medicare HMO

## 2017-10-24 DIAGNOSIS — R531 Weakness: Secondary | ICD-10-CM | POA: Insufficient documentation

## 2017-10-24 DIAGNOSIS — F0390 Unspecified dementia without behavioral disturbance: Secondary | ICD-10-CM | POA: Diagnosis not present

## 2017-10-24 DIAGNOSIS — E119 Type 2 diabetes mellitus without complications: Secondary | ICD-10-CM | POA: Insufficient documentation

## 2017-10-24 DIAGNOSIS — Z794 Long term (current) use of insulin: Secondary | ICD-10-CM | POA: Insufficient documentation

## 2017-10-24 DIAGNOSIS — N12 Tubulo-interstitial nephritis, not specified as acute or chronic: Secondary | ICD-10-CM | POA: Diagnosis not present

## 2017-10-24 DIAGNOSIS — Z7902 Long term (current) use of antithrombotics/antiplatelets: Secondary | ICD-10-CM | POA: Diagnosis not present

## 2017-10-24 DIAGNOSIS — M545 Low back pain, unspecified: Secondary | ICD-10-CM

## 2017-10-24 DIAGNOSIS — Z79899 Other long term (current) drug therapy: Secondary | ICD-10-CM | POA: Diagnosis not present

## 2017-10-24 DIAGNOSIS — R630 Anorexia: Secondary | ICD-10-CM | POA: Diagnosis present

## 2017-10-24 LAB — CBC WITH DIFFERENTIAL/PLATELET
BASOS ABS: 0 10*3/uL (ref 0.0–0.1)
BASOS PCT: 0 %
Eosinophils Absolute: 0.2 10*3/uL (ref 0.0–0.7)
Eosinophils Relative: 2 %
HEMATOCRIT: 46.4 % (ref 39.0–52.0)
Hemoglobin: 16.4 g/dL (ref 13.0–17.0)
LYMPHS PCT: 43 %
Lymphs Abs: 4.3 10*3/uL — ABNORMAL HIGH (ref 0.7–4.0)
MCH: 32.2 pg (ref 26.0–34.0)
MCHC: 35.3 g/dL (ref 30.0–36.0)
MCV: 91 fL (ref 78.0–100.0)
Monocytes Absolute: 0.8 10*3/uL (ref 0.1–1.0)
Monocytes Relative: 8 %
NEUTROS ABS: 4.6 10*3/uL (ref 1.7–7.7)
Neutrophils Relative %: 47 %
PLATELETS: 212 10*3/uL (ref 150–400)
RBC: 5.1 MIL/uL (ref 4.22–5.81)
RDW: 13.5 % (ref 11.5–15.5)
WBC: 10 10*3/uL (ref 4.0–10.5)

## 2017-10-24 LAB — COMPREHENSIVE METABOLIC PANEL
ALBUMIN: 4.4 g/dL (ref 3.5–5.0)
ALT: 14 U/L — ABNORMAL LOW (ref 17–63)
ANION GAP: 7 (ref 5–15)
AST: 17 U/L (ref 15–41)
Alkaline Phosphatase: 35 U/L — ABNORMAL LOW (ref 38–126)
BILIRUBIN TOTAL: 0.8 mg/dL (ref 0.3–1.2)
BUN: 13 mg/dL (ref 6–20)
CALCIUM: 9.3 mg/dL (ref 8.9–10.3)
CO2: 28 mmol/L (ref 22–32)
Chloride: 99 mmol/L — ABNORMAL LOW (ref 101–111)
Creatinine, Ser: 1.01 mg/dL (ref 0.61–1.24)
GFR calc Af Amer: >60 mL/min (ref >60)
Glucose, Bld: 132 mg/dL — ABNORMAL HIGH (ref 65–99)
POTASSIUM: 4.4 mmol/L (ref 3.5–5.1)
Sodium: 134 mmol/L — ABNORMAL LOW (ref 135–145)
TOTAL PROTEIN: 7.1 g/dL (ref 6.5–8.1)

## 2017-10-24 LAB — CBG MONITORING, ED: Glucose-Capillary: 116 mg/dL — ABNORMAL HIGH (ref 65–99)

## 2017-10-24 LAB — I-STAT CG4 LACTIC ACID, ED: Lactic Acid, Venous: 1.57 mmol/L (ref 0.5–1.9)

## 2017-10-24 MED ORDER — SODIUM CHLORIDE 0.9 % IV BOLUS (SEPSIS): 1000.0000 mL | Freq: Once | INTRAVENOUS | Status: DC

## 2017-10-24 NOTE — ED Triage Notes (Signed)
Pt reports that he has not been hungry for the last 4 days. His wife states that he has only eaten about 4 bites of oatmeal. He denies N/V/D. He also endorses some chronic back pain. He has a cortisone shot scheduled on 1/28. Hx diabetes. A&Ox4.. Ambulatory.

## 2017-10-24 NOTE — ED Notes (Signed)
Pt and family endorses decrease in appetite x4 days. Pt is more tired then normal and family states that he appears to just not be himself. Pt lower back pain started several months back and while waiting for the Cortizone shots the pt condition has gotten worse, per family.

## 2017-10-25 DIAGNOSIS — M545 Low back pain: Secondary | ICD-10-CM | POA: Diagnosis not present

## 2017-10-25 LAB — URINALYSIS, ROUTINE W REFLEX MICROSCOPIC
Bilirubin Urine: NEGATIVE
GLUCOSE, UA: NEGATIVE mg/dL
Ketones, ur: 5 mg/dL — AB
NITRITE: NEGATIVE
PROTEIN: NEGATIVE mg/dL
Specific Gravity, Urine: 1.02 (ref 1.005–1.030)
pH: 5 (ref 5.0–8.0)

## 2017-10-25 LAB — I-STAT CG4 LACTIC ACID, ED: LACTIC ACID, VENOUS: 1.82 mmol/L (ref 0.5–1.9)

## 2017-10-25 MED ORDER — CEPHALEXIN 500 MG PO CAPS
500.0000 mg | ORAL_CAPSULE | Freq: Once | ORAL | Status: AC
  Administered 2017-10-25: 500 mg via ORAL
  Filled 2017-10-25: qty 1

## 2017-10-25 MED ORDER — CEPHALEXIN 500 MG PO CAPS: 500.0000 mg | ORAL_CAPSULE | Freq: Three times a day (TID) | ORAL | 0 refills | Status: DC

## 2017-10-25 NOTE — Discharge Instructions (Signed)
Please take the antibiotics prescribed.  See your primary care doctor in 1 week..  Please return to the ER if your symptoms worsen; you have increased pain, fevers, chills, inability to keep any medications down, confusion. Otherwise see the outpatient doctor as requested.

## 2017-10-25 NOTE — ED Provider Notes (Addendum)
[facility] DEPT Provider Note   CSN: [id number] Arrival date & time: 10/24/17  1345     History   Chief Complaint Chief Complaint  Patient presents with  . No Appetite    diabetic  . Back Pain    HPI Ryan Gilmore is a 84 y.o. male.  HPI  84 year old male with history of diabetes and chronic back pain comes in a chief complaint of back pain and weakness.  Patient reports that for the past 1 week he has had generalized weakness, with poor appetite.  Patient denies any associated abdominal pain, diarrhea, nausea or vomiting.  Patient has known chronic back pain and he is reports that his back pain is flaring up right now.  Patient denies any flank pain.  Review of system is negative for any URI-like symptoms, chest pain, shortness of breath, headaches or trauma, dizziness.  Past Medical History:  Diagnosis Date  . Bleeding duodenal ulcer    ?  1980s  . Dementia   . Diabetes mellitus without complication ([place])   . Hyperlipidemia     Patient Active Problem List   Diagnosis Date Noted  . Chronic midline low back pain without sciatica 10/12/2017  . Type II or unspecified type diabetes mellitus without mention of complication, uncontrolled 06/28/2013  . Other and unspecified hyperlipidemia 06/28/2013    Past Surgical History:  Procedure Laterality Date  . APPENDECTOMY         Home Medications    Prior to Admission medications   Medication Sig Start Date End Date Taking? Authorizing Provider  donepezil (ARICEPT) 5 MG tablet TAKE ONE TABLET BY MOUTH AT BEDTIME 02/12/17  Yes [name], MD  glimepiride (AMARYL) 4 MG tablet TAKE 1 TABLET BY MOUTH ONCE DAILY Patient taking differently: TAKE 4 MG BY MOUTH TWCE DAILY 09/01/17  Yes [name], MD  glucose blood (ACCU-CHEK SMARTVIEW) test strip Use as instructed to check blood sugar 2 times per day dx code E11.65 01/30/15  Yes Elayne Snare, MD  Insulin Pen Needle (BD PEN NEEDLE NANO  U/F) 32G X 4 MM MISC Use 1 per day to inject insulin 03/07/16  Yes Elayne Snare, MD  lisinopril (PRINIVIL,ZESTRIL) 10 MG tablet Take 1 tablet (10 mg total) by mouth daily. 12/12/16  Yes [name], MD  meloxicam (MOBIC) 15 MG tablet TAKE 1 TABLET BY MOUTH  DAILY 10/05/17  Yes [name], MD  metFORMIN (GLUCOPHAGE) 500 MG tablet TAKE TWO TABLETS BY MOUTH TWICE DAILY WITH  A  MEAL 06/23/17  Yes [name], MD  pioglitazone (ACTOS) 30 MG tablet TAKE 1 TABLET BY MOUTH ONCE DAILY 10/05/17  Yes [name], MD  pravastatin (PRAVACHOL) 40 MG tablet Take 1 tablet (40 mg total) by mouth daily. 12/15/16  Yes PickardCammie [name], MD  Semaglutide (OZEMPIC) 1 MG/DOSE SOPN Inject 1 mg into the skin once a week.   Yes [provider]  traMADol (ULTRAM) 50 MG tablet Take 1 tablet (50 mg total) by mouth every 8 (eight) hours as needed. Patient taking differently: Take 50 mg by mouth every 8 (eight) hours as needed for moderate pain or severe pain.  10/20/17  Yes [name], MD  cephALEXin (KEFLEX) 500 MG capsule Take 1 capsule (500 mg total) by mouth 3 (three) times daily. 10/25/17   Varney Biles, MD  VICTOZA 18 MG/3ML SOPN Inject 0.3 mLs (1.8 mg total) into the skin daily. Inject once daily at the same  time Patient not taking: Reported on 10/24/2017 06/12/15   Elayne Snare, MD    Family History Family History  Problem Relation Age of Onset  . Heart disease Father   . Diabetes Brother     Social History Social History   Tobacco Use  . Smoking status: Never Smoker  . Smokeless tobacco: Current User    Types: Chew  Substance Use Topics  . Alcohol use: Not on file  . Drug use: Not on file     Allergies   Patient has no known allergies.   Review of Systems Review of Systems  Constitutional: Positive for activity change.  All other systems reviewed and are negative.    Physical Exam Updated Vital Signs BP 118/75   Pulse 83   Temp (!) 97.5 F (36.4 C) (Oral)    Resp 16   SpO2 95%   Physical Exam  Constitutional: He is oriented to person, place, and time. He appears well-developed.  HENT:  Head: Atraumatic.  Neck: Neck supple.  Cardiovascular: Normal rate.  Pulmonary/Chest: Effort normal.  Abdominal: Soft. There is no tenderness. There is no guarding.  Musculoskeletal:  Pt has tenderness over the lumbar region No step offs, no erythema. Pt has 2+ patellar reflex bilaterally. Able to discriminate between sharp and dull. Able to ambulate   Neurological: He is alert and oriented to person, place, and time. No cranial nerve deficit.  Skin: Skin is warm.  Nursing note and vitals reviewed.    ED Treatments / Results  Labs (all labs ordered are listed, but only abnormal results are displayed) Labs Reviewed  COMPREHENSIVE METABOLIC PANEL - Abnormal; Notable for the following components:      Result Value   Sodium 134 (*)    Chloride 99 (*)    Glucose, Bld 132 (*)    ALT 14 (*)    Alkaline Phosphatase 35 (*)    All other components within normal limits  CBC WITH DIFFERENTIAL/PLATELET - Abnormal; Notable for the following components:   Lymphs Abs 4.3 (*)    All other components within normal limits  URINALYSIS, ROUTINE W REFLEX MICROSCOPIC - Abnormal; Notable for the following components:   APPearance HAZY (*)    Hgb urine dipstick SMALL (*)    Ketones, ur 5 (*)    Leukocytes, UA LARGE (*)    Bacteria, UA RARE (*)    Squamous Epithelial / LPF 0-5 (*)    Non Squamous Epithelial 0-5 (*)    All other components within normal limits  CBG MONITORING, ED - Abnormal; Notable for the following components:   Glucose-Capillary 116 (*)    All other components within normal limits  URINE CULTURE  I-STAT CG4 LACTIC ACID, ED  I-STAT CG4 LACTIC ACID, ED    EKG  EKG Interpretation  Date/Time:  Saturday October 24 2017 23:36:56 EST Ventricular Rate:  87 PR Interval:    QRS Duration: 91 QT Interval:  321 QTC Calculation: 387 R  Axis:   77 Text Interpretation:  Sinus rhythm Borderline repolarization abnormality No previous ECGs available Confirmed by Wandra Arthurs [phone]) on 10/25/2017 10:41:47 PM      Date: 10/25/2017  Rate: 87  Rhythm: normal sinus rhythm  QRS Axis: normal  Intervals: normal  ST/T Wave abnormalities: normal  Conduction Disutrbances: none  Narrative Interpretation: unremarkable      Radiology Dg Lumbar Spine Complete  Result Date: 10/25/2017 CLINICAL DATA:  Low back pain. EXAM: LUMBAR SPINE - COMPLETE 4+ VIEW COMPARISON:  10/12/2017  FINDINGS: Curvature of the lumbar spine is convex towards the left. There is multi level disc space narrowing and ventral endplate spurring identified at L3-4, L4-5. No fractures or dislocations. IMPRESSION: 1. No acute findings. 2. Lumbar degenerative disc disease. Electronically Signed   By: Kerby Moors M.D.   On: 10/25/2017 00:18    Procedures Procedures (including critical care time)  Medications Ordered in ED Medications  cephALEXin (KEFLEX) capsule 500 mg (500 mg Oral Given 10/25/17 0128)     Initial Impression / Assessment and Plan / ED Course  I have reviewed the triage vital signs and the nursing notes.  Pertinent labs & imaging results that were available during my care of the patient were reviewed by me and considered in my medical decision making (see chart for details).  Clinical Course as of Oct 25 2334  Nancy Fetter Oct 25, 2017  0053 UA is suspicious for UTI. EKG is reassuring. We will cancel the troponin that was never sent out, given we have diagnosis, and MI is not high on the ddx with a normal ekg.  Results from the ER workup discussed with the patient face to face and all questions answered to the best of my ability.  Strict ER return precautions have been discussed, and patient is agreeing with the plan and is comfortable with the workup done and the recommendations from the ER.  Pt advised to see pcp in 1 week. WBC, UA: TOO NUMEROUS TO  COUNT [AN]    Clinical Course User Index [AN] Varney Biles, MD    DDx includes: ICH / Stroke Sepsis syndrome Infection - UTI/Pneumonia Electrolyte abnormality DKA Metabolic disorders including thyroid disorders, adrenal insufficiency Cancer of unknown origin / paraneoplastic process  84 year old male comes in with chief complaint of generalized weakness, and poor appetite.  [name] review of system is negative for any URI-like symptoms, focal neurologic symptoms, chest pain / dib / palpitations.  Clinical suspicion for UTI, given the back pain and history of diabetes.  Labs do not indicate any evidence of DKA.  EKG and troponin ordered to make sure there is no silent MI.  Final Clinical Impressions(s) / ED Diagnoses   Final diagnoses:  Midline low back pain without sciatica, unspecified chronicity  Generalized weakness  Pyelonephritis    ED Discharge Orders        Ordered    cephALEXin (KEFLEX) 500 MG capsule  3 times daily     10/25/17 0056       Varney Biles, MD 10/25/17 2876      Varney Biles, MD 10/25/17 2336

## 2017-10-27 LAB — URINE CULTURE: Culture: >=100000 — AB

## 2017-10-28 ENCOUNTER — Telehealth: Payer: Self-pay | Admitting: Emergency Medicine

## 2017-10-28 NOTE — Progress Notes (Signed)
ED Antimicrobial Stewardship Positive Culture Follow Up   Ryan Gilmore is an 84 y.o. male who presented to [facility] on 10/24/2017 with a chief complaint of  Chief Complaint  Patient presents with  . No Appetite    diabetic  . Back Pain    Recent Results (from the past 720 hour(s))  Urine culture     Status: Abnormal   Collection Time: 10/24/17 11:31 PM  Result Value Ref Range Status   Specimen Description URINE, RANDOM  Final   Special Requests NONE  Final   Culture >=100,000 COLONIES/mL ENTEROCOCCUS FAECALIS (A)  Final   Report Status 10/27/2017 FINAL  Final   Organism ID, Bacteria ENTEROCOCCUS FAECALIS (A)  Final      Susceptibility   Enterococcus faecalis - MIC*    AMPICILLIN <=2 SENSITIVE Sensitive     LEVOFLOXACIN 1 SENSITIVE Sensitive     NITROFURANTOIN <=16 SENSITIVE Sensitive     VANCOMYCIN 2 SENSITIVE Sensitive     * >=100,000 COLONIES/mL ENTEROCOCCUS FAECALIS   New antibiotic prescription: stop taking keflex, no further treatment needed at this time.   ED Provider: Javier Docker, PA-C  Jalene Mullet, Pharm.D. PGY1 Pharmacy Resident 10/28/2017 10:08 AM Main Pharmacy: [phone]  Phone# [phone]

## 2017-10-28 NOTE — Telephone Encounter (Signed)
Post ED Visit - Positive Culture Follow-up: Successful Patient Follow-Up  Culture assessed and recommendations reviewed by: []  Elenor Quinones, Pharm.D. []  Heide Guile, Pharm.D., BCPS AQ-ID []  Parks Neptune, Pharm.D., BCPS []  Alycia Rossetti, Pharm.D., BCPS []  [place], Pharm.D., BCPS, AAHIVP []  Legrand Como, Pharm.D., BCPS, AAHIVP []  Salome Arnt, PharmD, BCPS []  Dimitri Ped, PharmD, BCPS []  Vincenza Hews, PharmD, BCPS Jalene Mullet PharmD  Positive urine culture  []  Patient discharged without antimicrobial prescription and treatment is now indicated []  Organism is resistant to prescribed ED discharge antimicrobial []  Patient with positive blood cultures  Changes discussed with ED provider: Providence Lanius PA New antibiotic prescription stop cephalexin, no further treatment needed  Patient aware to stop antibiotics   Hazle Nordmann 10/28/2017, 2:34 PM

## 2017-10-29 ENCOUNTER — Ambulatory Visit
Admission: RE | Admit: 2017-10-29 | Discharge: 2017-10-29 | Disposition: A | Discharge status: Home / Self Care | Payer: Medicare HMO | Source: Ambulatory Visit | Attending: Family Medicine | Admitting: Family Medicine

## 2017-10-29 ENCOUNTER — Ambulatory Visit (INDEPENDENT_AMBULATORY_CARE_PROVIDER_SITE_OTHER): Payer: Medicare HMO | Admitting: Family Medicine

## 2017-10-29 VITALS — BP 126/68 | HR 88 | Temp 97.5°F | Resp 12 | Ht 70.5 in | Wt 180.0 lb

## 2017-10-29 DIAGNOSIS — N410 Acute prostatitis: Secondary | ICD-10-CM

## 2017-10-29 DIAGNOSIS — R110 Nausea: Secondary | ICD-10-CM | POA: Diagnosis not present

## 2017-10-29 DIAGNOSIS — R634 Abnormal weight loss: Secondary | ICD-10-CM | POA: Diagnosis not present

## 2017-10-29 DIAGNOSIS — R109 Unspecified abdominal pain: Secondary | ICD-10-CM | POA: Diagnosis not present

## 2017-10-29 MED ORDER — CIPROFLOXACIN HCL 500 MG PO TABS: 500.0000 mg | ORAL_TABLET | Freq: Two times a day (BID) | ORAL | 0 refills | Status: DC

## 2017-10-29 MED ORDER — ONDANSETRON HCL 4 MG PO TABS: 4.0000 mg | ORAL_TABLET | Freq: Three times a day (TID) | ORAL | 0 refills | Status: DC | PRN

## 2017-10-29 NOTE — Progress Notes (Signed)
Subjective:    Patient ID: Ryan Gilmore, male    DOB: 09-01-1933, 84 y.o.   MRN: [id number]  HPI  10/22/17 Patient presents today with his wife to discuss his chronic low back pain.  Patient saw his orthopedist approximately 10 days ago and I have recommended bilateral L4-L5 facet joint injections as well as L5-S1 facet joint injections due to his degenerative disc disease to see if that will help with his low back pain.  [name] treatment is complicated by his underlying dementia as well.  He continues to endorse low back pain.  His wife states that he is in misery.  He primarily sits in a chair unwilling to stand or move due to the pain.  He points to an area just lateral to his lumbar spine to the right side right above the SI joint.  There is no tenderness to palpation in that area.  He does report decreased range of motion.  He denies any sciatica or leg weakness.  He is tried and failed physical therapy.  I did give him a prescription for tramadol 50 mg tablets to take every 8 hours as needed.  Wife has been given it to him every 8 hours and states that it helped some occasionally and at other times provided no relief.  At that time, the plan was:  I explained to the patient and his wife that I was concerned about increasing the strength of his pain medication possibly exacerbating his dementia and also increasing his fall risk.  I explained to his wife that she could use the tramadol every 6 hours as needed.  If he is having severe pain, she did could increase to 100 mg sparingly.  I cautioned her against regular use unless the pain is moderate to severe due to the increased risk of falls and confusion area he has a cortisone injection scheduled for 10 days from now.  Hopefully that will provide him relief as well  10/29/17 Patient recently went to the emergency room complaining of poor appetite and weight loss. Urinalysis showed pyuria. He was started on Keflex for possible urinary tract  infection. Urine culture returned recently showing Enterococcus faecalis. However he was directed to stop his antibiotic by the emergency room today have not taken any further doses of his antibiotic. His wife states that he is now extremely nauseated. He is losing weight. He is not eating. She denies any fevers or chills. He continues to complain of low back pain. He denies any dysuria or hematuria or urgency or frequency. However he is unable to provide an accurate history due to dementia. He does report no appetite, early satiety, weakness, and nausea.  Recently began ozempic at [place]. Past Medical History:  Diagnosis Date  . Bleeding duodenal ulcer    ?  1980s  . Dementia   . Diabetes mellitus without complication ([place])   . Hyperlipidemia    Past Surgical History:  Procedure Laterality Date  . APPENDECTOMY     Current Outpatient Medications on File Prior to Visit  Medication Sig Dispense Refill  . cephALEXin (KEFLEX) 500 MG capsule Take 1 capsule (500 mg total) by mouth 3 (three) times daily. (Patient not taking: Reported on 10/29/2017) 30 capsule 0  . donepezil (ARICEPT) 5 MG tablet TAKE ONE TABLET BY MOUTH AT BEDTIME 90 tablet 3  . glimepiride (AMARYL) 4 MG tablet TAKE 1 TABLET BY MOUTH ONCE DAILY (Patient taking differently: TAKE 4 MG BY MOUTH TWCE DAILY) 90 tablet 1  .  glucose blood (ACCU-CHEK SMARTVIEW) test strip Use as instructed to check blood sugar 2 times per day dx code E11.65 100 each 5  . Insulin Pen Needle (BD PEN NEEDLE NANO U/F) 32G X 4 MM MISC Use 1 per day to inject insulin 30 each 3  . lisinopril (PRINIVIL,ZESTRIL) 10 MG tablet Take 1 tablet (10 mg total) by mouth daily. 90 tablet 3  . meloxicam (MOBIC) 15 MG tablet TAKE 1 TABLET BY MOUTH  DAILY 30 tablet 2  . metFORMIN (GLUCOPHAGE) 500 MG tablet TAKE TWO TABLETS BY MOUTH TWICE DAILY WITH  A  MEAL 360 tablet 3  . pioglitazone (ACTOS) 30 MG tablet TAKE 1 TABLET BY MOUTH ONCE DAILY 90 tablet 1  . pravastatin (PRAVACHOL) 40 MG  tablet Take 1 tablet (40 mg total) by mouth daily. 90 tablet 2  . Semaglutide (OZEMPIC) 1 MG/DOSE SOPN Inject 1 mg into the skin once a week.    . traMADol (ULTRAM) 50 MG tablet Take 1 tablet (50 mg total) by mouth every 8 (eight) hours as needed. (Patient taking differently: Take 50 mg by mouth every 8 (eight) hours as needed for moderate pain or severe pain. ) 30 tablet 0  . VICTOZA 18 MG/3ML SOPN Inject 0.3 mLs (1.8 mg total) into the skin daily. Inject once daily at the same time (Patient not taking: Reported on 10/24/2017) 3 pen 5   No current facility-administered medications on file prior to visit.    No Known Allergies Social History   Socioeconomic History  . Marital status: Married    Spouse name: Not on file  . Number of children: Not on file  . Years of education: Not on file  . Highest education level: Not on file  Social Needs  . Financial resource strain: Not on file  . Food insecurity - worry: Not on file  . Food insecurity - inability: Not on file  . Transportation needs - medical: Not on file  . Transportation needs - non-medical: Not on file  Occupational History  . Not on file  Tobacco Use  . Smoking status: Never Smoker  . Smokeless tobacco: Current User    Types: Chew  Substance and Sexual Activity  . Alcohol use: Not on file  . Drug use: Not on file  . Sexual activity: Not on file  Other Topics Concern  . Not on file  Social History Narrative  . Not on file      Review of Systems  All other systems reviewed and are negative.      Objective:   Physical Exam  Cardiovascular: Normal rate, regular rhythm and normal heart sounds.  Pulmonary/Chest: Effort normal and breath sounds normal. No respiratory distress. He has no wheezes. He has no rales.  Musculoskeletal:       Lumbar back: He exhibits decreased range of motion, tenderness and pain. He exhibits no bony tenderness, no swelling, no edema, no deformity and no spasm.       Back:  Vitals  reviewed.         Assessment & Plan:  Acute prostatitis - Plan: ciprofloxacin (CIPRO) 500 MG tablet, ondansetron (ZOFRAN) 4 MG tablet, DG Abd Acute W/Chest  Nausea  Weight loss I am concerned about dehydration. He has lost 4 pounds since I last saw him. I want him to discontinue lisinopril. I also want him to hold Amaryl, metformin because he is not taking adequate by mouth nutrition and I'm concerned about hypoglycemia. Also want him to temporarily discontinue  ozempic as this could be affecting his appetite and making him nauseated. Meanwhile start Zofran 4 mg by mouth every 8 hours when necessary nausea and go for an x-ray of the abdomen to rule out bowel obstruction or ileus or fecal impaction. I also believe that some of his nausea and weakness could be due to undiagnosed prostatitis. In an 84 year old man, a simple uncomplicated UTI is very unlikely. For him to have silent symptoms, I would be concerned that this could be from prostatitis. Therefore I recommended Cipro 500 mg by mouth twice a day for 10 days given the culture confirmed urinary tract infection. Recheck on Monday

## 2017-11-02 ENCOUNTER — Encounter (INDEPENDENT_AMBULATORY_CARE_PROVIDER_SITE_OTHER): Payer: Medicare HMO | Admitting: Physical Medicine and Rehabilitation

## 2017-11-02 ENCOUNTER — Encounter (INDEPENDENT_AMBULATORY_CARE_PROVIDER_SITE_OTHER): Payer: Self-pay

## 2017-11-02 ENCOUNTER — Ambulatory Visit (INDEPENDENT_AMBULATORY_CARE_PROVIDER_SITE_OTHER): Payer: Medicare HMO | Admitting: Family Medicine

## 2017-11-02 ENCOUNTER — Encounter: Payer: Self-pay | Admitting: Family Medicine

## 2017-11-02 VITALS — BP 96/62 | HR 96 | Temp 97.7°F | Resp 12 | Ht 70.5 in | Wt 184.0 lb

## 2017-11-02 DIAGNOSIS — R634 Abnormal weight loss: Secondary | ICD-10-CM

## 2017-11-02 DIAGNOSIS — R110 Nausea: Secondary | ICD-10-CM

## 2017-11-02 DIAGNOSIS — E119 Type 2 diabetes mellitus without complications: Secondary | ICD-10-CM

## 2017-11-02 DIAGNOSIS — M545 Low back pain, unspecified: Secondary | ICD-10-CM

## 2017-11-02 DIAGNOSIS — G8929 Other chronic pain: Secondary | ICD-10-CM | POA: Diagnosis not present

## 2017-11-02 NOTE — Progress Notes (Signed)
Subjective:    Patient ID: Ryan Gilmore, male    DOB: 02/17/1933, 84 y.o.   MRN: [id number]  HPI  10/22/17 Patient presents today with his wife to discuss his chronic low back pain.  Patient saw his orthopedist approximately 10 days ago and I have recommended bilateral L4-L5 facet joint injections as well as L5-S1 facet joint injections due to his degenerative disc disease to see if that will help with his low back pain.  [name] treatment is complicated by his underlying dementia as well.  He continues to endorse low back pain.  His wife states that he is in misery.  He primarily sits in a chair unwilling to stand or move due to the pain.  He points to an area just lateral to his lumbar spine to the right side right above the SI joint.  There is no tenderness to palpation in that area.  He does report decreased range of motion.  He denies any sciatica or leg weakness.  He is tried and failed physical therapy.  I did give him a prescription for tramadol 50 mg tablets to take every 8 hours as needed.  Wife has been given it to him every 8 hours and states that it helped some occasionally and at other times provided no relief.  At that time, the plan was:  I explained to the patient and his wife that I was concerned about increasing the strength of his pain medication possibly exacerbating his dementia and also increasing his fall risk.  I explained to his wife that she could use the tramadol every 6 hours as needed.  If he is having severe pain, she did could increase to 100 mg sparingly.  I cautioned her against regular use unless the pain is moderate to severe due to the increased risk of falls and confusion area he has a cortisone injection scheduled for 10 days from now.  Hopefully that will provide him relief as well  10/29/17 Patient recently went to the emergency room complaining of poor appetite and weight loss. Urinalysis showed pyuria. He was started on Keflex for possible urinary tract  infection. Urine culture returned recently showing Enterococcus faecalis. However he was directed to stop his antibiotic by the emergency room today have not taken any further doses of his antibiotic. His wife states that he is now extremely nauseated. He is losing weight. He is not eating. She denies any fevers or chills. He continues to complain of low back pain. He denies any dysuria or hematuria or urgency or frequency. However he is unable to provide an accurate history due to dementia. He does report no appetite, early satiety, weakness, and nausea.  Recently began ozempic at [place].  At that time, my plan was: I am concerned about dehydration. He has lost 4 pounds since I last saw him. I want him to discontinue lisinopril. I also want him to hold Amaryl, metformin because he is not taking adequate by mouth nutrition and I'm concerned about hypoglycemia. Also want him to temporarily discontinue ozempic as this could be affecting his appetite and making him nauseated. Meanwhile start Zofran 4 mg by mouth every 8 hours when necessary nausea and go for an x-ray of the abdomen to rule out bowel obstruction or ileus or fecal impaction. I also believe that some of his nausea and weakness could be due to undiagnosed prostatitis. In an 84 year old man, a simple uncomplicated UTI is very unlikely. For him to have silent symptoms, I would  be concerned that this could be from prostatitis. Therefore I recommended Cipro 500 mg by mouth twice a day for 10 days given the culture confirmed urinary tract infection. Recheck on Monday  11/02/17 The x-ray confirmed a moderate amount of constipation and stool throughout the colon. Family was recommended to use MiraLAX as it was thought that the constipation could be contributing to his poor intake. MiraLAX was ineffective. They did give him 1 dose of linzess which seemed to work better.  [name] weight is up 4 pounds today compared to his last visit. His color looks better. He  has more energy. He is smiling. He denies any abdominal pain or nausea. However his history is limited by his dementia. His wife states that he still not eating very well and he continues to complain of low back pain. Past Medical History:  Diagnosis Date  . Bleeding duodenal ulcer    ?  1980s  . Dementia   . Diabetes mellitus without complication ([place])   . Hyperlipidemia    Past Surgical History:  Procedure Laterality Date  . APPENDECTOMY     Current Outpatient Medications on File Prior to Visit  Medication Sig Dispense Refill  . ciprofloxacin (CIPRO) 500 MG tablet Take 1 tablet (500 mg total) by mouth 2 (two) times daily. 20 tablet 0  . donepezil (ARICEPT) 5 MG tablet TAKE ONE TABLET BY MOUTH AT BEDTIME 90 tablet 3  . glimepiride (AMARYL) 4 MG tablet TAKE 1 TABLET BY MOUTH ONCE DAILY (Patient taking differently: TAKE 4 MG BY MOUTH TWCE DAILY) 90 tablet 1  . glucose blood (ACCU-CHEK SMARTVIEW) test strip Use as instructed to check blood sugar 2 times per day dx code E11.65 100 each 5  . meloxicam (MOBIC) 15 MG tablet TAKE 1 TABLET BY MOUTH  DAILY 30 tablet 2  . ondansetron (ZOFRAN) 4 MG tablet Take 1 tablet (4 mg total) by mouth every 8 (eight) hours as needed for nausea or vomiting. 20 tablet 0  . pioglitazone (ACTOS) 30 MG tablet TAKE 1 TABLET BY MOUTH ONCE DAILY 90 tablet 1  . pravastatin (PRAVACHOL) 40 MG tablet Take 1 tablet (40 mg total) by mouth daily. 90 tablet 2  . Semaglutide (OZEMPIC) 1 MG/DOSE SOPN Inject 1 mg into the skin once a week.    . traMADol (ULTRAM) 50 MG tablet Take 1 tablet (50 mg total) by mouth every 8 (eight) hours as needed. (Patient taking differently: Take 50 mg by mouth every 8 (eight) hours as needed for moderate pain or severe pain. ) 30 tablet 0   No current facility-administered medications on file prior to visit.    No Known Allergies Social History   Socioeconomic History  . Marital status: Married    Spouse name: Not on file  . Number of  children: Not on file  . Years of education: Not on file  . Highest education level: Not on file  Social Needs  . Financial resource strain: Not on file  . Food insecurity - worry: Not on file  . Food insecurity - inability: Not on file  . Transportation needs - medical: Not on file  . Transportation needs - non-medical: Not on file  Occupational History  . Not on file  Tobacco Use  . Smoking status: Never Smoker  . Smokeless tobacco: Current User    Types: Chew  Substance and Sexual Activity  . Alcohol use: Not on file  . Drug use: Not on file  . Sexual activity: Not on  file  Other Topics Concern  . Not on file  Social History Narrative  . Not on file      Review of Systems  All other systems reviewed and are negative.      Objective:   Physical Exam  Cardiovascular: Normal rate, regular rhythm and normal heart sounds.  Pulmonary/Chest: Effort normal and breath sounds normal. No respiratory distress. He has no wheezes. He has no rales.  Musculoskeletal:       Lumbar back: He exhibits decreased range of motion, tenderness and pain. He exhibits no bony tenderness, no swelling, no edema, no deformity and no spasm.       Back:  Vitals reviewed.         Assessment & Plan:  Nausea  Weight loss  Chronic low back pain without sciatica, unspecified back pain laterality  Controlled type 2 diabetes mellitus without complication, without long-term current use of insulin ([place])  I believe the patient has turned the corner and starting to show signs of improvement. I recommended staying on linzess 145 mcg poqday for constipation unless he develops diarrhea. Continue to refrain from using ozempic as I believe this could be causing his nausea and poor appetite. He truly is starting to appear better. His next dose of the medicine was due on Saturday so really he's only been off the medication for one or 2 days. I would like to see if he continues to improve over the next week  off the medication particularly as his prostatitis improves and his constipation improves. Recheck on Monday of next week.

## 2017-11-09 ENCOUNTER — Encounter: Payer: Self-pay | Admitting: Family Medicine

## 2017-11-09 ENCOUNTER — Ambulatory Visit (INDEPENDENT_AMBULATORY_CARE_PROVIDER_SITE_OTHER): Payer: Medicare HMO | Admitting: Family Medicine

## 2017-11-09 ENCOUNTER — Encounter (INDEPENDENT_AMBULATORY_CARE_PROVIDER_SITE_OTHER): Payer: Self-pay | Admitting: Orthopaedic Surgery

## 2017-11-09 ENCOUNTER — Ambulatory Visit (INDEPENDENT_AMBULATORY_CARE_PROVIDER_SITE_OTHER): Payer: Medicare HMO | Admitting: Orthopaedic Surgery

## 2017-11-09 VITALS — BP 110/68 | HR 70 | Temp 97.6°F | Resp 16 | Ht 70.5 in | Wt 187.0 lb

## 2017-11-09 DIAGNOSIS — M545 Low back pain, unspecified: Secondary | ICD-10-CM

## 2017-11-09 DIAGNOSIS — R634 Abnormal weight loss: Secondary | ICD-10-CM | POA: Diagnosis not present

## 2017-11-09 DIAGNOSIS — R110 Nausea: Secondary | ICD-10-CM | POA: Diagnosis not present

## 2017-11-09 DIAGNOSIS — N410 Acute prostatitis: Secondary | ICD-10-CM | POA: Diagnosis not present

## 2017-11-09 DIAGNOSIS — G8929 Other chronic pain: Secondary | ICD-10-CM | POA: Diagnosis not present

## 2017-11-09 LAB — URINALYSIS, ROUTINE W REFLEX MICROSCOPIC
Bilirubin Urine: NEGATIVE
HGB URINE DIPSTICK: NEGATIVE
Ketones, ur: NEGATIVE
LEUKOCYTES UA: NEGATIVE
NITRITE: NEGATIVE
PH: 5.5 (ref 5.0–8.0)
Protein, ur: NEGATIVE
SPECIFIC GRAVITY, URINE: 1.02 (ref 1.001–1.03)

## 2017-11-09 LAB — COMPLETE METABOLIC PANEL WITH GFR
AG Ratio: 2.3 (calc) (ref 1.0–2.5)
ALBUMIN MSPROF: 4.1 g/dL (ref 3.6–5.1)
ALT: 18 U/L (ref 9–46)
AST: 14 U/L (ref 10–35)
Alkaline phosphatase (APISO): 34 U/L — ABNORMAL LOW (ref 40–115)
BILIRUBIN TOTAL: 0.5 mg/dL (ref 0.2–1.2)
BUN: 10 mg/dL (ref 7–25)
CALCIUM: 9.5 mg/dL (ref 8.6–10.3)
CO2: 30 mmol/L (ref 20–32)
CREATININE: 0.9 mg/dL (ref 0.70–1.11)
Chloride: 103 mmol/L (ref 98–110)
GFR, EST AFRICAN AMERICAN: 92 mL/min/{1.73_m2} (ref >=60)
GFR, EST NON AFRICAN AMERICAN: 79 mL/min/{1.73_m2} (ref >=60)
GLUCOSE: 245 mg/dL — AB (ref 65–99)
Globulin: 1.8 g/dL (calc) — ABNORMAL LOW (ref 1.9–3.7)
Potassium: 4.5 mmol/L (ref 3.5–5.3)
Sodium: 140 mmol/L (ref 135–146)
TOTAL PROTEIN: 5.9 g/dL — AB (ref 6.1–8.1)

## 2017-11-09 LAB — CBC WITH DIFFERENTIAL/PLATELET
BASOS PCT: 0.8 %
Basophils Absolute: 58 cells/uL (ref 0–200)
EOS ABS: 266 {cells}/uL (ref 15–500)
Eosinophils Relative: 3.7 %
HCT: 42.9 % (ref 38.5–50.0)
HEMOGLOBIN: 14.9 g/dL (ref 13.2–17.1)
Lymphs Abs: 2938 cells/uL (ref 850–3900)
MCH: 31.4 pg (ref 27.0–33.0)
MCHC: 34.7 g/dL (ref 32.0–36.0)
MCV: 90.5 fL (ref 80.0–100.0)
MPV: 11.7 fL (ref 7.5–12.5)
Monocytes Relative: 6.8 %
NEUTROS ABS: 3449 {cells}/uL (ref 1500–7800)
Neutrophils Relative %: 47.9 %
Platelets: 213 10*3/uL (ref 140–400)
RBC: 4.74 10*6/uL (ref 4.20–5.80)
RDW: 12.9 % (ref 11.0–15.0)
Total Lymphocyte: 40.8 %
WBC mixed population: 490 cells/uL (ref 200–950)
WBC: 7.2 10*3/uL (ref 3.8–10.8)

## 2017-11-09 MED ORDER — TRAMADOL HCL 50 MG PO TABS: 50.0000 mg | ORAL_TABLET | Freq: Three times a day (TID) | ORAL | 0 refills | Status: DC | PRN

## 2017-11-09 NOTE — Progress Notes (Signed)
Subjective:    Patient ID: Ryan Gilmore, male    DOB: 02/17/1933, 84 y.o.   MRN: [id number]  HPI  10/22/17 Patient presents today with his wife to discuss his chronic low back pain.  Patient saw his orthopedist approximately 10 days ago and I have recommended bilateral L4-L5 facet joint injections as well as L5-S1 facet joint injections due to his degenerative disc disease to see if that will help with his low back pain.  [name] treatment is complicated by his underlying dementia as well.  He continues to endorse low back pain.  His wife states that he is in misery.  He primarily sits in a chair unwilling to stand or move due to the pain.  He points to an area just lateral to his lumbar spine to the right side right above the SI joint.  There is no tenderness to palpation in that area.  He does report decreased range of motion.  He denies any sciatica or leg weakness.  He is tried and failed physical therapy.  I did give him a prescription for tramadol 50 mg tablets to take every 8 hours as needed.  Wife has been given it to him every 8 hours and states that it helped some occasionally and at other times provided no relief.  At that time, the plan was:  I explained to the patient and his wife that I was concerned about increasing the strength of his pain medication possibly exacerbating his dementia and also increasing his fall risk.  I explained to his wife that she could use the tramadol every 6 hours as needed.  If he is having severe pain, she did could increase to 100 mg sparingly.  I cautioned her against regular use unless the pain is moderate to severe due to the increased risk of falls and confusion area he has a cortisone injection scheduled for 10 days from now.  Hopefully that will provide him relief as well  10/29/17 Patient recently went to the emergency room complaining of poor appetite and weight loss. Urinalysis showed pyuria. He was started on Keflex for possible urinary tract  infection. Urine culture returned recently showing Enterococcus faecalis. However he was directed to stop his antibiotic by the emergency room today have not taken any further doses of his antibiotic. His wife states that he is now extremely nauseated. He is losing weight. He is not eating. She denies any fevers or chills. He continues to complain of low back pain. He denies any dysuria or hematuria or urgency or frequency. However he is unable to provide an accurate history due to dementia. He does report no appetite, early satiety, weakness, and nausea.  Recently began ozempic at [place].  At that time, my plan was: I am concerned about dehydration. He has lost 4 pounds since I last saw him. I want him to discontinue lisinopril. I also want him to hold Amaryl, metformin because he is not taking adequate by mouth nutrition and I'm concerned about hypoglycemia. Also want him to temporarily discontinue ozempic as this could be affecting his appetite and making him nauseated. Meanwhile start Zofran 4 mg by mouth every 8 hours when necessary nausea and go for an x-ray of the abdomen to rule out bowel obstruction or ileus or fecal impaction. I also believe that some of his nausea and weakness could be due to undiagnosed prostatitis. In an 84 year old man, a simple uncomplicated UTI is very unlikely. For him to have silent symptoms, I would  be concerned that this could be from prostatitis. Therefore I recommended Cipro 500 mg by mouth twice a day for 10 days given the culture confirmed urinary tract infection. Recheck on Monday  11/02/17 The x-ray confirmed a moderate amount of constipation and stool throughout the colon. Family was recommended to use MiraLAX as it was thought that the constipation could be contributing to his poor intake. MiraLAX was ineffective. They did give him 1 dose of linzess which seemed to work better.  [name] weight is up 4 pounds today compared to his last visit. His color looks better. He  has more energy. He is smiling. He denies any abdominal pain or nausea. However his history is limited by his dementia. His wife states that he still not eating very well and he continues to complain of low back pain.  At that time, my plan was: I believe the patient has turned the corner and starting to show signs of improvement. I recommended staying on linzess 145 mcg poqday for constipation unless he develops diarrhea. Continue to refrain from using ozempic as I believe this could be causing his nausea and poor appetite. He truly is starting to appear better. His next dose of the medicine was due on Saturday so really he's only been off the medication for one or 2 days. I would like to see if he continues to improve over the next week off the medication particularly as his prostatitis improves and his constipation improves. Recheck on Monday of next week.  11/09/17 Wt Readings from Last 3 Encounters:  11/09/17 187 lb (84.8 kg)  11/02/17 184 lb (83.5 kg)  10/29/17 180 lb (81.6 kg)   Clinically, patient seems to be doing much better.  He is gained 7 pounds in the last week.  His appetite is better.  He is eating and drinking more.  His constipation is better on linzess.  He denies any dysuria.  Wife still complains that he is complaining of low back pain.  He is scheduled for epidural steroid injection on February 18 now which may help with this.  She still complains that his energy level and drive is also lower than normal.  However I believe a lot of this could be dementia related.  Patient states that he feels fine and he denies any complaints today.  He is smiling and interactive and appears healthy and well-nourished Past Medical History:  Diagnosis Date  . Bleeding duodenal ulcer    ?  1980s  . Dementia   . Diabetes mellitus without complication ([employer])   . Hyperlipidemia    Past Surgical History:  Procedure Laterality Date  . APPENDECTOMY     Current Outpatient Medications on File Prior to  Visit  Medication Sig Dispense Refill  . ciprofloxacin (CIPRO) 500 MG tablet Take 1 tablet (500 mg total) by mouth 2 (two) times daily. 20 tablet 0  . donepezil (ARICEPT) 5 MG tablet TAKE ONE TABLET BY MOUTH AT BEDTIME 90 tablet 3  . glucose blood (ACCU-CHEK SMARTVIEW) test strip Use as instructed to check blood sugar 2 times per day dx code E11.65 100 each 5  . meloxicam (MOBIC) 15 MG tablet TAKE 1 TABLET BY MOUTH  DAILY 30 tablet 2  . pioglitazone (ACTOS) 30 MG tablet TAKE 1 TABLET BY MOUTH ONCE DAILY 90 tablet 1  . pravastatin (PRAVACHOL) 40 MG tablet Take 1 tablet (40 mg total) by mouth daily. 90 tablet 2  . traMADol (ULTRAM) 50 MG tablet Take 1 tablet (50 mg  total) by mouth every 8 (eight) hours as needed. (Patient taking differently: Take 50 mg by mouth every 8 (eight) hours as needed for moderate pain or severe pain. ) 30 tablet 0  . glimepiride (AMARYL) 4 MG tablet TAKE 1 TABLET BY MOUTH ONCE DAILY (Patient not taking: Reported on 11/09/2017) 90 tablet 1  . Semaglutide (OZEMPIC) 1 MG/DOSE SOPN Inject 1 mg into the skin once a week.     No current facility-administered medications on file prior to visit.    No Known Allergies Social History   Socioeconomic History  . Marital status: Married    Spouse name: Not on file  . Number of children: Not on file  . Years of education: Not on file  . Highest education level: Not on file  Social Needs  . Financial resource strain: Not on file  . Food insecurity - worry: Not on file  . Food insecurity - inability: Not on file  . Transportation needs - medical: Not on file  . Transportation needs - non-medical: Not on file  Occupational History  . Not on file  Tobacco Use  . Smoking status: Never Smoker  . Smokeless tobacco: Current User    Types: Chew  Substance and Sexual Activity  . Alcohol use: Not on file  . Drug use: Not on file  . Sexual activity: Not on file  Other Topics Concern  . Not on file  Social History Narrative  .  Not on file      Review of Systems  All other systems reviewed and are negative.      Objective:   Physical Exam  Cardiovascular: Normal rate, regular rhythm and normal heart sounds.  Pulmonary/Chest: Effort normal and breath sounds normal. No respiratory distress. He has no wheezes. He has no rales.  Musculoskeletal:       Lumbar back: He exhibits decreased range of motion, tenderness and pain. He exhibits no bony tenderness, no swelling, no edema, no deformity and no spasm.       Back:  Vitals reviewed.         Assessment & Plan:  Nausea  Weight loss  Chronic low back pain without sciatica, unspecified back pain laterality  Acute prostatitis - Plan: Urinalysis, Routine w reflex microscopic, CBC with Differential/Platelet, COMPLETE METABOLIC PANEL WITH GFR, Urine Culture  Clinically the patient looks much better.  His nausea and weight loss has completely resolved.  I believe his poor appetite was likely secondary to ozempic.  I have recommended today discontinue that medication permanently.  I would like him to resume metformin as his appetite has improved.  I have recommended that his wife monitor his blood sugars.  If his blood sugars rise greater than 200, I would then resume glimepiride.  However I want her to keep him away from the glimepiride at the present time.  Stay on linzess 72 mcg p.o. daily for chronic constipation.  Repeat urinalysis and urine culture to ensure resolution of prostatitis.  Clinically the patient appears to be back to his baseline.

## 2017-11-09 NOTE — Progress Notes (Signed)
The patient is here today with his wife for follow-up.  He was originally scheduled to have an intervention by Dr. Ernestina Patches in his spine with likely facet joint injections but is been to the emergency room in the interim with constipation and prostatitis.  He still has his chronic back pain.  His primary care physician has put him on tramadol.  Due to slight dementia and increased fall risk we have not wanted to increase his pain medications nor his primary care physician and were all in agreement on this.  He is actually scheduled to have his intervention with Dr. Ernestina Patches on February 18.  I have encouraged him to keep that appointment.  They have been working on blood glucose control and he was on antibiotics for the prostatitis.  On exam his pain seems to be still upper and lower lumbar spine but facet joint mediated.  At this point we will have him keep his appoint with Dr. Ernestina Patches on the 18th and I will see him back in 6 weeks to see how is doing overall.  I did send in some more tramadol for him.

## 2017-11-10 LAB — URINE CULTURE
MICRO NUMBER:: 90147058
Result:: NO GROWTH
SPECIMEN QUALITY: ADEQUATE

## 2017-11-23 ENCOUNTER — Encounter (INDEPENDENT_AMBULATORY_CARE_PROVIDER_SITE_OTHER): Payer: Self-pay | Admitting: Physical Medicine and Rehabilitation

## 2017-11-23 ENCOUNTER — Ambulatory Visit (INDEPENDENT_AMBULATORY_CARE_PROVIDER_SITE_OTHER): Payer: Medicare HMO | Admitting: Physical Medicine and Rehabilitation

## 2017-11-23 ENCOUNTER — Ambulatory Visit (INDEPENDENT_AMBULATORY_CARE_PROVIDER_SITE_OTHER): Payer: Medicare HMO

## 2017-11-23 VITALS — BP 116/82 | HR 81 | Temp 98.1°F

## 2017-11-23 DIAGNOSIS — M545 Low back pain: Secondary | ICD-10-CM

## 2017-11-23 DIAGNOSIS — M47816 Spondylosis without myelopathy or radiculopathy, lumbar region: Secondary | ICD-10-CM

## 2017-11-23 DIAGNOSIS — G8929 Other chronic pain: Secondary | ICD-10-CM | POA: Diagnosis not present

## 2017-11-23 MED ORDER — METHYLPREDNISOLONE ACETATE 80 MG/ML IJ SUSP
80.0000 mg | Freq: Once | INTRAMUSCULAR | Status: AC
  Administered 2017-11-23: 80 mg

## 2017-11-23 NOTE — Progress Notes (Deleted)
Pt states a pain in lower back. Pt states pain has been there for about 3-4 months. Pt states cutting wood or doing heavy activities makes pain worse, relaxing makes pain better. +Driver, -BT, -Dye Allergies.

## 2017-11-24 ENCOUNTER — Other Ambulatory Visit: Payer: Self-pay | Admitting: Family Medicine

## 2017-11-24 NOTE — Procedures (Signed)
Lumbar Facet Joint Intra-Articular Injection(s) with Fluoroscopic Guidance  Patient: Ryan Gilmore      Date of Birth: March 12, 1933 MRN: [id number] PCP: [name], MD      Visit Date: 11/23/2017   Universal Protocol:    Date/Time: 11/23/2017  Consent Given By: the patient  Position: PRONE   Additional Comments: Vital signs were monitored before and after the procedure. Patient was prepped and draped in the usual sterile fashion. The correct patient, procedure, and site was verified.   Injection Procedure Details:  Procedure Site One Meds Administered:  Meds ordered this encounter  Medications  . methylPREDNISolone acetate (DEPO-MEDROL) injection 80 mg     Laterality: Bilateral  Location/Site:  L4-L5  Needle size: 22 guage  Needle type: Spinal  Needle Placement: Articular  Findings:  -Comments: Excellent flow of contrast producing a partial arthrogram.  Procedure Details: The fluoroscope beam is vertically oriented in AP, and the inferior recess is visualized beneath the lower pole of the inferior apophyseal process, which represents the target point for needle insertion. When direct visualization is difficult the target point is located at the medial projection of the vertebral pedicle. The region overlying each aforementioned target is locally anesthetized with a 1 to 2 ml. volume of 1% Lidocaine without Epinephrine.   The spinal needle was inserted into each of the above mentioned facet joints using biplanar fluoroscopic guidance. A 0.25 to 0.5 ml. volume of Isovue-250 was injected and a partial facet joint arthrogram was obtained. A single spot film was obtained of the resulting arthrogram.    One to 1.25 ml of the steroid/anesthetic solution was then injected into each of the facet joints noted above.   Additional Comments:  The patient tolerated the procedure well Dressing: Band-Aid    Post-procedure details: Patient was observed during the  procedure. Post-procedure instructions were reviewed.  Patient left the clinic in stable condition.

## 2017-11-24 NOTE — Patient Instructions (Signed)

## 2017-11-24 NOTE — Progress Notes (Signed)
Ryan Gilmore - 84 y.o. male MRN [id number]  Date of birth: [birth year]  Office Visit Note: Visit Date: 11/23/2017 PCP: [name], MD Referred by: [name], MD  Subjective: Chief Complaint  Patient presents with  . Lower Back - Pain   HPI: Ryan Gilmore is an 84 year old gentleman with 3-4 months of chronic worsening axial low back pain reports trying to do any activities with heavy lifting to any degree including cutting wood and other activities makes his pain worse.  He reports decreased symptoms if he sits and relaxes.  Medications have not really helped when he is up moving.  He is been followed by his primary care physician Dr. Dennard Schaumann has had conservative care with physical therapy and medication management.  He saw Dr. Ninfa Linden for evaluation he felt like he was having facet joint pain.  Unfortunately between seeing Dr. Ninfa Linden and trying to get in for potential intervention he has had acute prostatitis and pyelonephritis but he is over this at that point.  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  He thinks over time this is per his back.  X-rays show facet arthropathy.  No MRI has been done.  He has no red flag complaints and no radicular pain.    ROS Otherwise per HPI.  Assessment & Plan: Visit Diagnoses:  1. Spondylosis without myelopathy or radiculopathy, lumbar region   2. Chronic bilateral low back pain without sciatica     Plan: Findings:  Stick and hopefully therapeutic bilateral L4-5 facet joint blocks.    Meds & Orders:  Meds ordered this encounter  Medications  . methylPREDNISolone acetate (DEPO-MEDROL) injection 80 mg    Orders Placed This Encounter  Procedures  . Facet Injection  . XR C-ARM NO REPORT    Follow-up: Return if symptoms worsen or fail to improve.   Procedures: No procedures performed  Lumbar Facet Joint Intra-Articular Injection(s) with Fluoroscopic Guidance  Patient: Ryan Gilmore      Date of Birth: 03/04/1933 MRN: [id number] PCP: [name], MD      Visit Date: 11/23/2017   Universal Protocol:    Date/Time: 11/23/2017  Consent Given By: the patient  Position: PRONE   Additional Comments: Vital signs were monitored before and after the procedure. Patient was prepped and draped in the usual sterile fashion. The correct patient, procedure, and site was verified.   Injection Procedure Details:  Procedure Site One Meds Administered:  Meds ordered this encounter  Medications  . methylPREDNISolone acetate (DEPO-MEDROL) injection 80 mg     Laterality: Bilateral  Location/Site:  L4-L5  Needle size: 22 guage  Needle type: Spinal  Needle Placement: Articular  Findings:  -Comments: Excellent flow of contrast producing a partial arthrogram.  Procedure Details: The fluoroscope beam is vertically oriented in AP, and the inferior recess is visualized beneath the lower pole of the inferior apophyseal process, which represents the target point for needle insertion. When direct visualization is difficult the target point is located at the medial projection of the vertebral pedicle. The region overlying each aforementioned target is locally anesthetized with a 1 to 2 ml. volume of 1% Lidocaine without Epinephrine.   The spinal needle was inserted into each of the above mentioned facet joints using biplanar fluoroscopic guidance. A 0.25 to 0.5 ml. volume of Isovue-250 was injected and a partial facet joint arthrogram was obtained. A single spot film was obtained of the resulting arthrogram.  One to 1.25 ml of the steroid/anesthetic solution was then injected into each of the facet joints noted above.   Additional Comments:  The patient tolerated the procedure well Dressing: Band-Aid    Post-procedure details: Patient was observed during the procedure. Post-procedure instructions were reviewed.  Patient left the clinic in stable condition.     Clinical History: LUMBAR SPINE - COMPLETE 4+ VIEW  COMPARISON:  10/12/2017  FINDINGS: Curvature of the lumbar spine is convex towards the left. There is multi level disc space narrowing and ventral endplate spurring identified at L3-4, L4-5. No fractures or dislocations.  IMPRESSION: 1. No acute findings. 2. Lumbar degenerative disc disease.   Electronically Signed   By: Kerby Moors M.D.   On: 10/25/2017 00:18  He reports that  has never smoked. His smokeless tobacco use includes chew.  Recent Labs    12/12/16 0839 06/18/17 1438  HGBA1C 7.1* 7.4*    Objective:  VS:  HT:    WT:   BMI:     BP:116/82  HR:81bpm  TEMP:98.1 F (36.7 C)(Oral)  RESP:99 % Physical Exam  Musculoskeletal:  Pain with extension rotation of the lumbar spine.  Good distal strength.    Ortho Exam Imaging: Xr C-arm No Report  Result Date: 11/23/2017 Please see Notes or Procedures tab for imaging impression.   Past Medical/Family/Surgical/Social History: Medications & Allergies reviewed per EMR Patient Active Problem List   Diagnosis Date Noted  . Chronic midline low back pain without sciatica 10/12/2017  . Type II or unspecified type diabetes mellitus without mention of complication, uncontrolled 06/28/2013  . Other and unspecified hyperlipidemia 06/28/2013   Past Medical History:  Diagnosis Date  . Bleeding duodenal ulcer    ?  1980s  . Dementia   . Diabetes mellitus without complication ([place])   . Hyperlipidemia    Family History  Problem Relation Age of Onset  . Heart disease Father   . Diabetes Brother    Past Surgical History:  Procedure Laterality Date  . APPENDECTOMY     Social History   Occupational History  . Not on file  Tobacco Use  . Smoking status: Never Smoker  . Smokeless tobacco: Current User    Types: Chew  Substance and Sexual Activity  . Alcohol use: Not on file  . Drug use: Not on file  . Sexual activity: Not on file

## 2017-11-26 ENCOUNTER — Ambulatory Visit (INDEPENDENT_AMBULATORY_CARE_PROVIDER_SITE_OTHER): Payer: Medicare HMO | Admitting: Family Medicine

## 2017-11-26 ENCOUNTER — Encounter: Payer: Self-pay | Admitting: Family Medicine

## 2017-11-26 ENCOUNTER — Other Ambulatory Visit: Payer: Self-pay

## 2017-11-26 VITALS — BP 112/60 | HR 80 | Temp 98.0°F | Resp 16 | Ht 70.5 in | Wt 185.0 lb

## 2017-11-26 DIAGNOSIS — E1165 Type 2 diabetes mellitus with hyperglycemia: Secondary | ICD-10-CM

## 2017-11-26 DIAGNOSIS — IMO0002 Reserved for concepts with insufficient information to code with codable children: Secondary | ICD-10-CM

## 2017-11-26 DIAGNOSIS — E118 Type 2 diabetes mellitus with unspecified complications: Secondary | ICD-10-CM | POA: Diagnosis not present

## 2017-11-26 MED ORDER — GLIMEPIRIDE 4 MG PO TABS: 4.0000 mg | ORAL_TABLET | Freq: Every day | ORAL | 1 refills | Status: DC

## 2017-11-26 NOTE — Progress Notes (Signed)
Subjective:    Patient ID: Ryan Gilmore, male    DOB: 02/17/1933, 84 y.o.   MRN: [id number]  HPI  10/22/17 Patient presents today with his wife to discuss his chronic low back pain.  Patient saw his orthopedist approximately 10 days ago and I have recommended bilateral L4-L5 facet joint injections as well as L5-S1 facet joint injections due to his degenerative disc disease to see if that will help with his low back pain.  [name] treatment is complicated by his underlying dementia as well.  He continues to endorse low back pain.  His wife states that he is in misery.  He primarily sits in a chair unwilling to stand or move due to the pain.  He points to an area just lateral to his lumbar spine to the right side right above the SI joint.  There is no tenderness to palpation in that area.  He does report decreased range of motion.  He denies any sciatica or leg weakness.  He is tried and failed physical therapy.  I did give him a prescription for tramadol 50 mg tablets to take every 8 hours as needed.  Wife has been given it to him every 8 hours and states that it helped some occasionally and at other times provided no relief.  At that time, the plan was:  I explained to the patient and his wife that I was concerned about increasing the strength of his pain medication possibly exacerbating his dementia and also increasing his fall risk.  I explained to his wife that she could use the tramadol every 6 hours as needed.  If he is having severe pain, she did could increase to 100 mg sparingly.  I cautioned her against regular use unless the pain is moderate to severe due to the increased risk of falls and confusion area he has a cortisone injection scheduled for 10 days from now.  Hopefully that will provide him relief as well  10/29/17 Patient recently went to the emergency room complaining of poor appetite and weight loss. Urinalysis showed pyuria. He was started on Keflex for possible urinary tract  infection. Urine culture returned recently showing Enterococcus faecalis. However he was directed to stop his antibiotic by the emergency room today have not taken any further doses of his antibiotic. His wife states that he is now extremely nauseated. He is losing weight. He is not eating. She denies any fevers or chills. He continues to complain of low back pain. He denies any dysuria or hematuria or urgency or frequency. However he is unable to provide an accurate history due to dementia. He does report no appetite, early satiety, weakness, and nausea.  Recently began ozempic at [place].  At that time, my plan was: I am concerned about dehydration. He has lost 4 pounds since I last saw him. I want him to discontinue lisinopril. I also want him to hold Amaryl, metformin because he is not taking adequate by mouth nutrition and I'm concerned about hypoglycemia. Also want him to temporarily discontinue ozempic as this could be affecting his appetite and making him nauseated. Meanwhile start Zofran 4 mg by mouth every 8 hours when necessary nausea and go for an x-ray of the abdomen to rule out bowel obstruction or ileus or fecal impaction. I also believe that some of his nausea and weakness could be due to undiagnosed prostatitis. In an 84 year old man, a simple uncomplicated UTI is very unlikely. For him to have silent symptoms, I would  be concerned that this could be from prostatitis. Therefore I recommended Cipro 500 mg by mouth twice a day for 10 days given the culture confirmed urinary tract infection. Recheck on Monday  11/02/17 The x-ray confirmed a moderate amount of constipation and stool throughout the colon. Family was recommended to use MiraLAX as it was thought that the constipation could be contributing to his poor intake. MiraLAX was ineffective. They did give him 1 dose of linzess which seemed to work better.  [name] weight is up 4 pounds today compared to his last visit. His color looks better. He  has more energy. He is smiling. He denies any abdominal pain or nausea. However his history is limited by his dementia. His wife states that he still not eating very well and he continues to complain of low back pain.  At that time, my plan was: I believe the patient has turned the corner and starting to show signs of improvement. I recommended staying on linzess 145 mcg poqday for constipation unless he develops diarrhea. Continue to refrain from using ozempic as I believe this could be causing his nausea and poor appetite. He truly is starting to appear better. His next dose of the medicine was due on Saturday so really he's only been off the medication for one or 2 days. I would like to see if he continues to improve over the next week off the medication particularly as his prostatitis improves and his constipation improves. Recheck on Monday of next week.  11/09/17 Wt Readings from Last 3 Encounters:  11/26/17 185 lb (83.9 kg)  11/09/17 187 lb (84.8 kg)  11/02/17 184 lb (83.5 kg)   Clinically, patient seems to be doing much better.  He is gained 7 pounds in the last week.  His appetite is better.  He is eating and drinking more.  His constipation is better on linzess.  He denies any dysuria.  Wife still complains that he is complaining of low back pain.  He is scheduled for epidural steroid injection on February 18 now which may help with this.  She still complains that his energy level and drive is also lower than normal.  However I believe a lot of this could be dementia related.  Patient states that he feels fine and he denies any complaints today.  He is smiling and interactive and appears healthy and well-nourished.  At that time, my plan was:  Clinically the patient looks much better.  His nausea and weight loss has completely resolved.  I believe his poor appetite was likely secondary to ozempic.  I have recommended today discontinue that medication permanently.  I would like him to resume  metformin as his appetite has improved.  I have recommended that his wife monitor his blood sugars.  If his blood sugars rise greater than 200, I would then resume glimepiride.  However I want her to keep him away from the glimepiride at the present time.  Stay on linzess 72 mcg p.o. daily for chronic constipation.  Repeat urinalysis and urine culture to ensure resolution of prostatitis.  Clinically the patient appears to be back to his baseline.  11/26/17 The patient's wife is starting to see elevated blood sugars.  His blood sugar this morning was 220 fasting and shortly after lunch, it was greater than 300.  He denies any polyuria, polydipsia, or blurry vision.  He has been taking Actos.  He is also resumed his metformin however he is still off glimepiride as well as  ozempic as stated in earlier office visits Past Medical History:  Diagnosis Date  . Bleeding duodenal ulcer    ?  1980s  . Dementia   . Diabetes mellitus without complication ([place])   . Hyperlipidemia    Past Surgical History:  Procedure Laterality Date  . APPENDECTOMY     Current Outpatient Medications on File Prior to Visit  Medication Sig Dispense Refill  . donepezil (ARICEPT) 5 MG tablet TAKE 1 TABLET BY MOUTH AT BEDTIME 90 tablet 3  . glucose blood (ACCU-CHEK SMARTVIEW) test strip Use as instructed to check blood sugar 2 times per day dx code E11.65 100 each 5  . meloxicam (MOBIC) 15 MG tablet TAKE 1 TABLET BY MOUTH  DAILY 30 tablet 2  . pioglitazone (ACTOS) 30 MG tablet TAKE 1 TABLET BY MOUTH ONCE DAILY 90 tablet 1  . pravastatin (PRAVACHOL) 40 MG tablet TAKE 1 TABLET BY MOUTH ONCE DAILY 90 tablet 2  . traMADol (ULTRAM) 50 MG tablet Take 1 tablet (50 mg total) by mouth every 8 (eight) hours as needed. 60 tablet 0   No current facility-administered medications on file prior to visit.    No Known Allergies Social History   Socioeconomic History  . Marital status: Married    Spouse name: Not on file  . Number of  children: Not on file  . Years of education: Not on file  . Highest education level: Not on file  Social Needs  . Financial resource strain: Not on file  . Food insecurity - worry: Not on file  . Food insecurity - inability: Not on file  . Transportation needs - medical: Not on file  . Transportation needs - non-medical: Not on file  Occupational History  . Not on file  Tobacco Use  . Smoking status: Never Smoker  . Smokeless tobacco: Current User    Types: Chew  Substance and Sexual Activity  . Alcohol use: Not on file  . Drug use: Not on file  . Sexual activity: Not on file  Other Topics Concern  . Not on file  Social History Narrative  . Not on file      Review of Systems  All other systems reviewed and are negative.      Objective:   Physical Exam  Cardiovascular: Normal rate, regular rhythm and normal heart sounds.  Pulmonary/Chest: Effort normal and breath sounds normal. No respiratory distress. He has no wheezes. He has no rales.  Musculoskeletal:       Lumbar back: He exhibits decreased range of motion, tenderness and pain. He exhibits no bony tenderness, no swelling, no edema, no deformity and no spasm.       Back:  Vitals reviewed.         Assessment & Plan:  Uncontrolled type 2 diabetes mellitus with complication, without long-term current use of insulin (HCC) Resume glimepiride and recheck blood sugars in 1 week.  Ultimately I would like to keep his blood sugars between 100-200 is given his age and medical comorbidities.  If not in that range, patient would then benefit from adding [place].

## 2017-12-16 ENCOUNTER — Ambulatory Visit (INDEPENDENT_AMBULATORY_CARE_PROVIDER_SITE_OTHER): Payer: Medicare HMO | Admitting: Orthopaedic Surgery

## 2018-01-28 ENCOUNTER — Other Ambulatory Visit: Payer: Self-pay | Admitting: Family Medicine

## 2018-02-16 ENCOUNTER — Ambulatory Visit (INDEPENDENT_AMBULATORY_CARE_PROVIDER_SITE_OTHER): Payer: Medicare HMO | Admitting: Family Medicine

## 2018-02-16 ENCOUNTER — Encounter: Payer: Self-pay | Admitting: Family Medicine

## 2018-02-16 VITALS — BP 110/78 | HR 74 | Temp 97.6°F | Resp 14 | Ht 70.5 in | Wt 190.0 lb

## 2018-02-16 DIAGNOSIS — E1165 Type 2 diabetes mellitus with hyperglycemia: Secondary | ICD-10-CM

## 2018-02-16 DIAGNOSIS — E118 Type 2 diabetes mellitus with unspecified complications: Secondary | ICD-10-CM

## 2018-02-16 DIAGNOSIS — IMO0002 Reserved for concepts with insufficient information to code with codable children: Secondary | ICD-10-CM

## 2018-02-16 MED ORDER — METFORMIN HCL 500 MG PO TABS: 500.0000 mg | ORAL_TABLET | Freq: Two times a day (BID) | ORAL | 3 refills | Status: DC

## 2018-02-16 NOTE — Progress Notes (Signed)
Subjective:    Patient ID: Ryan Gilmore, male    DOB: 02/17/1933, 84 y.o.   MRN: [id number]  HPI  10/22/17 Patient presents today with his wife to discuss his chronic low back pain.  Patient saw his orthopedist approximately 10 days ago and I have recommended bilateral L4-L5 facet joint injections as well as L5-S1 facet joint injections due to his degenerative disc disease to see if that will help with his low back pain.  [name] treatment is complicated by his underlying dementia as well.  He continues to endorse low back pain.  His wife states that he is in misery.  He primarily sits in a chair unwilling to stand or move due to the pain.  He points to an area just lateral to his lumbar spine to the right side right above the SI joint.  There is no tenderness to palpation in that area.  He does report decreased range of motion.  He denies any sciatica or leg weakness.  He is tried and failed physical therapy.  I did give him a prescription for tramadol 50 mg tablets to take every 8 hours as needed.  Wife has been given it to him every 8 hours and states that it helped some occasionally and at other times provided no relief.  At that time, the plan was:  I explained to the patient and his wife that I was concerned about increasing the strength of his pain medication possibly exacerbating his dementia and also increasing his fall risk.  I explained to his wife that she could use the tramadol every 6 hours as needed.  If he is having severe pain, she did could increase to 100 mg sparingly.  I cautioned her against regular use unless the pain is moderate to severe due to the increased risk of falls and confusion area he has a cortisone injection scheduled for 10 days from now.  Hopefully that will provide him relief as well  10/29/17 Patient recently went to the emergency room complaining of poor appetite and weight loss. Urinalysis showed pyuria. He was started on Keflex for possible urinary tract  infection. Urine culture returned recently showing Enterococcus faecalis. However he was directed to stop his antibiotic by the emergency room today have not taken any further doses of his antibiotic. His wife states that he is now extremely nauseated. He is losing weight. He is not eating. She denies any fevers or chills. He continues to complain of low back pain. He denies any dysuria or hematuria or urgency or frequency. However he is unable to provide an accurate history due to dementia. He does report no appetite, early satiety, weakness, and nausea.  Recently began ozempic at [place].  At that time, my plan was: I am concerned about dehydration. He has lost 4 pounds since I last saw him. I want him to discontinue lisinopril. I also want him to hold Amaryl, metformin because he is not taking adequate by mouth nutrition and I'm concerned about hypoglycemia. Also want him to temporarily discontinue ozempic as this could be affecting his appetite and making him nauseated. Meanwhile start Zofran 4 mg by mouth every 8 hours when necessary nausea and go for an x-ray of the abdomen to rule out bowel obstruction or ileus or fecal impaction. I also believe that some of his nausea and weakness could be due to undiagnosed prostatitis. In an 84 year old man, a simple uncomplicated UTI is very unlikely. For him to have silent symptoms, I would  be concerned that this could be from prostatitis. Therefore I recommended Cipro 500 mg by mouth twice a day for 10 days given the culture confirmed urinary tract infection. Recheck on Monday  11/02/17 The x-ray confirmed a moderate amount of constipation and stool throughout the colon. Family was recommended to use MiraLAX as it was thought that the constipation could be contributing to his poor intake. MiraLAX was ineffective. They did give him 1 dose of linzess which seemed to work better.  [name] weight is up 4 pounds today compared to his last visit. His color looks better. He  has more energy. He is smiling. He denies any abdominal pain or nausea. However his history is limited by his dementia. His wife states that he still not eating very well and he continues to complain of low back pain.  At that time, my plan was: I believe the patient has turned the corner and starting to show signs of improvement. I recommended staying on linzess 145 mcg poqday for constipation unless he develops diarrhea. Continue to refrain from using ozempic as I believe this could be causing his nausea and poor appetite. He truly is starting to appear better. His next dose of the medicine was due on Saturday so really he's only been off the medication for one or 2 days. I would like to see if he continues to improve over the next week off the medication particularly as his prostatitis improves and his constipation improves. Recheck on Monday of next week.  11/09/17 Wt Readings from Last 3 Encounters:  11/26/17 185 lb (83.9 kg)  11/09/17 187 lb (84.8 kg)  11/02/17 184 lb (83.5 kg)   Clinically, patient seems to be doing much better.  He is gained 7 pounds in the last week.  His appetite is better.  He is eating and drinking more.  His constipation is better on linzess.  He denies any dysuria.  Wife still complains that he is complaining of low back pain.  He is scheduled for epidural steroid injection on February 18 now which may help with this.  She still complains that his energy level and drive is also lower than normal.  However I believe a lot of this could be dementia related.  Patient states that he feels fine and he denies any complaints today.  He is smiling and interactive and appears healthy and well-nourished.  At that time, my plan was:  Clinically the patient looks much better.  His nausea and weight loss has completely resolved.  I believe his poor appetite was likely secondary to ozempic.  I have recommended today discontinue that medication permanently.  I would like him to resume  metformin as his appetite has improved.  I have recommended that his wife monitor his blood sugars.  If his blood sugars rise greater than 200, I would then resume glimepiride.  However I want her to keep him away from the glimepiride at the present time.  Stay on linzess 72 mcg p.o. daily for chronic constipation.  Repeat urinalysis and urine culture to ensure resolution of prostatitis.  Clinically the patient appears to be back to his baseline.  11/26/17 The patient's wife is starting to see elevated blood sugars.  His blood sugar this morning was 220 fasting and shortly after lunch, it was greater than 300.  He denies any polyuria, polydipsia, or blurry vision.  He has been taking Actos.  He is also resumed his metformin however he is still off glimepiride as well as  ozempic as stated in earlier office visits.  AT that time, my plan was: Resume glimepiride and recheck blood sugars in 1 week.  Ultimately I would like to keep his blood sugars between 100-200 is given his age and medical comorbidities.  If not in that range, patient would then benefit from adding [place].  02/16/18 Patient is here today with his wife.  She reports blood sugars between 203 100 fasting in the morning.  She states that she is giving him metformin 500 twice a day despite the fact I stopped the medication earlier this year.  She is also giving him Amaryl and Actos as prescribed.  Sugars typically run between 200-250 most days.  She is also concerned because all he does is sit around and take naps.  He has no drive.  He has no motivation.  She will tell him to cut the grass and he states later and then forgets and never does it.  She tells him to shave, he will tell her later and then forget and never does.  She will ask him to go to town or go shopping with her, he will state later and then simply take a nap.  Due to his dementia, he is unable to provide additional history Past Medical History:  Diagnosis Date  . Bleeding  duodenal ulcer    ?  1980s  . Dementia   . Diabetes mellitus without complication ([place])   . Hyperlipidemia    Past Surgical History:  Procedure Laterality Date  . APPENDECTOMY     Current Outpatient Medications on File Prior to Visit  Medication Sig Dispense Refill  . donepezil (ARICEPT) 5 MG tablet TAKE 1 TABLET BY MOUTH AT BEDTIME 90 tablet 3  . glimepiride (AMARYL) 4 MG tablet Take 1 tablet (4 mg total) by mouth daily. 90 tablet 1  . glucose blood (ACCU-CHEK SMARTVIEW) test strip Use as instructed to check blood sugar 2 times per day dx code E11.65 100 each 5  . meloxicam (MOBIC) 15 MG tablet TAKE 1 TABLET BY MOUTH  DAILY 90 tablet 3  . pioglitazone (ACTOS) 30 MG tablet TAKE 1 TABLET BY MOUTH ONCE DAILY 90 tablet 1  . pravastatin (PRAVACHOL) 40 MG tablet TAKE 1 TABLET BY MOUTH ONCE DAILY 90 tablet 2  . traMADol (ULTRAM) 50 MG tablet Take 1 tablet (50 mg total) by mouth every 8 (eight) hours as needed. 60 tablet 0   No current facility-administered medications on file prior to visit.    No Known Allergies Social History   Socioeconomic History  . Marital status: Married    Spouse name: Not on file  . Number of children: Not on file  . Years of education: Not on file  . Highest education level: Not on file  Occupational History  . Not on file  Social Needs  . Financial resource strain: Not on file  . Food insecurity:    Worry: Not on file    Inability: Not on file  . Transportation needs:    Medical: Not on file    Non-medical: Not on file  Tobacco Use  . Smoking status: Never Smoker  . Smokeless tobacco: Current User    Types: Chew  Substance and Sexual Activity  . Alcohol use: Not on file  . Drug use: Not on file  . Sexual activity: Not on file  Lifestyle  . Physical activity:    Days per week: Not on file    Minutes per session: Not on file  .  Stress: Not on file  Relationships  . Social connections:    Talks on phone: Not on file    Gets together: Not on  file    Attends religious service: Not on file    Active member of club or organization: Not on file    Attends meetings of clubs or organizations: Not on file    Relationship status: Not on file  . Intimate partner violence:    Fear of current or ex partner: Not on file    Emotionally abused: Not on file    Physically abused: Not on file    Forced sexual activity: Not on file  Other Topics Concern  . Not on file  Social History Narrative  . Not on file      Review of Systems  All other systems reviewed and are negative.      Objective:   Physical Exam  Cardiovascular: Normal rate, regular rhythm and normal heart sounds.  Pulmonary/Chest: Effort normal and breath sounds normal. No respiratory distress. He has no wheezes. He has no rales.  Vitals reviewed.         Assessment & Plan:   Uncontrolled type 2 diabetes mellitus with complication, without long-term current use of insulin (HCC)  Discontinue metformin and replace with Janumet XR 50/1000, 2 tablets p.o. every morning.  Recheck blood sugars in 1 month.  Goal blood sugars are between 100-200.  Consider treating the patient for depression.  I believe his apathy is a combination of age, memory loss, and may be some mild underlying depression.  Consider adding low-dose SSRI at next visit to see if this will help

## 2018-03-16 ENCOUNTER — Encounter: Payer: Self-pay | Admitting: Family Medicine

## 2018-03-16 ENCOUNTER — Ambulatory Visit (INDEPENDENT_AMBULATORY_CARE_PROVIDER_SITE_OTHER): Payer: Medicare HMO | Admitting: Family Medicine

## 2018-03-16 VITALS — BP 110/70 | HR 90 | Temp 97.5°F | Resp 14 | Ht 70.5 in | Wt 190.0 lb

## 2018-03-16 DIAGNOSIS — E118 Type 2 diabetes mellitus with unspecified complications: Secondary | ICD-10-CM

## 2018-03-16 DIAGNOSIS — M545 Low back pain: Secondary | ICD-10-CM

## 2018-03-16 DIAGNOSIS — G8929 Other chronic pain: Secondary | ICD-10-CM

## 2018-03-16 MED ORDER — SITAGLIP PHOS-METFORMIN HCL ER 50-1000 MG PO TB24: 2.0000 | ORAL_TABLET | Freq: Every day | ORAL | 5 refills | Status: DC

## 2018-03-16 NOTE — Progress Notes (Signed)
Subjective:    Patient ID: Ryan Gilmore, male    DOB: 02/17/1933, 84 y.o.   MRN: [id number]  HPI  10/22/17 Patient presents today with his wife to discuss his chronic low back pain.  Patient saw his orthopedist approximately 10 days ago and I have recommended bilateral L4-L5 facet joint injections as well as L5-S1 facet joint injections due to his degenerative disc disease to see if that will help with his low back pain.  [name] treatment is complicated by his underlying dementia as well.  He continues to endorse low back pain.  His wife states that he is in misery.  He primarily sits in a chair unwilling to stand or move due to the pain.  He points to an area just lateral to his lumbar spine to the right side right above the SI joint.  There is no tenderness to palpation in that area.  He does report decreased range of motion.  He denies any sciatica or leg weakness.  He is tried and failed physical therapy.  I did give him a prescription for tramadol 50 mg tablets to take every 8 hours as needed.  Wife has been given it to him every 8 hours and states that it helped some occasionally and at other times provided no relief.  At that time, the plan was:  I explained to the patient and his wife that I was concerned about increasing the strength of his pain medication possibly exacerbating his dementia and also increasing his fall risk.  I explained to his wife that she could use the tramadol every 6 hours as needed.  If he is having severe pain, she did could increase to 100 mg sparingly.  I cautioned her against regular use unless the pain is moderate to severe due to the increased risk of falls and confusion area he has a cortisone injection scheduled for 10 days from now.  Hopefully that will provide him relief as well  10/29/17 Patient recently went to the emergency room complaining of poor appetite and weight loss. Urinalysis showed pyuria. He was started on Keflex for possible urinary tract  infection. Urine culture returned recently showing Enterococcus faecalis. However he was directed to stop his antibiotic by the emergency room today have not taken any further doses of his antibiotic. His wife states that he is now extremely nauseated. He is losing weight. He is not eating. She denies any fevers or chills. He continues to complain of low back pain. He denies any dysuria or hematuria or urgency or frequency. However he is unable to provide an accurate history due to dementia. He does report no appetite, early satiety, weakness, and nausea.  Recently began ozempic at [place].  At that time, my plan was: I am concerned about dehydration. He has lost 4 pounds since I last saw him. I want him to discontinue lisinopril. I also want him to hold Amaryl, metformin because he is not taking adequate by mouth nutrition and I'm concerned about hypoglycemia. Also want him to temporarily discontinue ozempic as this could be affecting his appetite and making him nauseated. Meanwhile start Zofran 4 mg by mouth every 8 hours when necessary nausea and go for an x-ray of the abdomen to rule out bowel obstruction or ileus or fecal impaction. I also believe that some of his nausea and weakness could be due to undiagnosed prostatitis. In an 84 year old man, a simple uncomplicated UTI is very unlikely. For him to have silent symptoms, I would  be concerned that this could be from prostatitis. Therefore I recommended Cipro 500 mg by mouth twice a day for 10 days given the culture confirmed urinary tract infection. Recheck on Monday  11/02/17 The x-ray confirmed a moderate amount of constipation and stool throughout the colon. Family was recommended to use MiraLAX as it was thought that the constipation could be contributing to his poor intake. MiraLAX was ineffective. They did give him 1 dose of linzess which seemed to work better.  [name] weight is up 4 pounds today compared to his last visit. His color looks better. He  has more energy. He is smiling. He denies any abdominal pain or nausea. However his history is limited by his dementia. His wife states that he still not eating very well and he continues to complain of low back pain.  At that time, my plan was: I believe the patient has turned the corner and starting to show signs of improvement. I recommended staying on linzess 145 mcg poqday for constipation unless he develops diarrhea. Continue to refrain from using ozempic as I believe this could be causing his nausea and poor appetite. He truly is starting to appear better. His next dose of the medicine was due on Saturday so really he's only been off the medication for one or 2 days. I would like to see if he continues to improve over the next week off the medication particularly as his prostatitis improves and his constipation improves. Recheck on Monday of next week.  11/09/17 Wt Readings from Last 3 Encounters:  03/16/18 190 lb (86.2 kg)  02/16/18 190 lb (86.2 kg)  11/26/17 185 lb (83.9 kg)   Clinically, patient seems to be doing much better.  He is gained 7 pounds in the last week.  His appetite is better.  He is eating and drinking more.  His constipation is better on linzess.  He denies any dysuria.  Wife still complains that he is complaining of low back pain.  He is scheduled for epidural steroid injection on February 18 now which may help with this.  She still complains that his energy level and drive is also lower than normal.  However I believe a lot of this could be dementia related.  Patient states that he feels fine and he denies any complaints today.  He is smiling and interactive and appears healthy and well-nourished.  At that time, my plan was:  Clinically the patient looks much better.  His nausea and weight loss has completely resolved.  I believe his poor appetite was likely secondary to ozempic.  I have recommended today discontinue that medication permanently.  I would like him to resume  metformin as his appetite has improved.  I have recommended that his wife monitor his blood sugars.  If his blood sugars rise greater than 200, I would then resume glimepiride.  However I want her to keep him away from the glimepiride at the present time.  Stay on linzess 72 mcg p.o. daily for chronic constipation.  Repeat urinalysis and urine culture to ensure resolution of prostatitis.  Clinically the patient appears to be back to his baseline.  11/26/17 The patient's wife is starting to see elevated blood sugars.  His blood sugar this morning was 220 fasting and shortly after lunch, it was greater than 300.  He denies any polyuria, polydipsia, or blurry vision.  He has been taking Actos.  He is also resumed his metformin however he is still off glimepiride as well as  ozempic as stated in earlier office visits.  AT that time, my plan was: Resume glimepiride and recheck blood sugars in 1 week.  Ultimately I would like to keep his blood sugars between 100-200 is given his age and medical comorbidities.  If not in that range, patient would then benefit from adding [place].  02/16/18 Patient is here today with his wife.  She reports blood sugars between 203 100 fasting in the morning.  She states that she is giving him metformin 500 twice a day despite the fact I stopped the medication earlier this year.  She is also giving him Amaryl and Actos as prescribed.  Sugars typically run between 200-250 most days.  She is also concerned because all he does is sit around and take naps.  He has no drive.  He has no motivation.  She will tell him to cut the grass and he states later and then forgets and never does it.  She tells him to shave, he will tell her later and then forget and never does.  She will ask him to go to town or go shopping with her, he will state later and then simply take a nap.  Due to his dementia, he is unable to provide additional history.  At that time, my plan was: Discontinue metformin and  replace with Janumet XR 50/1000, 2 tablets p.o. every morning.  Recheck blood sugars in 1 month.  Goal blood sugars are between 100-200.  Consider treating the patient for depression.  I believe his apathy is a combination of age, memory loss, and may be some mild underlying depression.  Consider adding low-dose SSRI at next visit to see if this will help  03/16/18 [name] wife states that his sugars have improved on Janumet.  She is still given him 2 tablets of this every morning in addition to Actos and glipizide.  Blood sugars are under 200 when she checks them.  She denies any polyuria, polydipsia, or blurry vision.  However she states that the patient is constantly complaining about pain in his lower back.  Pain is located around the level of L5 and is to the right of midline.  There is no palpable nodule or mass in that area.  He denies any numbness or tingling in his legs.  He denies any weakness in his legs.  He denies any bowel or bladder incontinence.  Work-up has included a referral to an orthopedist, as well as an x-ray of the lumbar spine his results are dictated below: 2 views of the lumbar spine show no acute findings in terms of fractures  or malalignment. There is degenerative changes but is only mild to  moderate at several levels but disc heights are decently maintained and  alignment is decently maintained. Past Medical History:  Diagnosis Date  . Bleeding duodenal ulcer    ?  1980s  . Dementia   . Diabetes mellitus without complication ([place])   . Hyperlipidemia    Past Surgical History:  Procedure Laterality Date  . APPENDECTOMY     Current Outpatient Medications on File Prior to Visit  Medication Sig Dispense Refill  . donepezil (ARICEPT) 5 MG tablet TAKE 1 TABLET BY MOUTH AT BEDTIME 90 tablet 3  . glimepiride (AMARYL) 4 MG tablet Take 1 tablet (4 mg total) by mouth daily. 90 tablet 1  . glucose blood (ACCU-CHEK SMARTVIEW) test strip Use as instructed to check blood  sugar 2 times per day dx code E11.65 100 each 5  .  meloxicam (MOBIC) 15 MG tablet TAKE 1 TABLET BY MOUTH  DAILY 90 tablet 3  . pioglitazone (ACTOS) 30 MG tablet TAKE 1 TABLET BY MOUTH ONCE DAILY 90 tablet 1  . pravastatin (PRAVACHOL) 40 MG tablet TAKE 1 TABLET BY MOUTH ONCE DAILY 90 tablet 2  . traMADol (ULTRAM) 50 MG tablet Take 1 tablet (50 mg total) by mouth every 8 (eight) hours as needed. 60 tablet 0   No current facility-administered medications on file prior to visit.    No Known Allergies Social History   Socioeconomic History  . Marital status: Married    Spouse name: Not on file  . Number of children: Not on file  . Years of education: Not on file  . Highest education level: Not on file  Occupational History  . Not on file  Social Needs  . Financial resource strain: Not on file  . Food insecurity:    Worry: Not on file    Inability: Not on file  . Transportation needs:    Medical: Not on file    Non-medical: Not on file  Tobacco Use  . Smoking status: Never Smoker  . Smokeless tobacco: Current User    Types: Chew  Substance and Sexual Activity  . Alcohol use: Not on file  . Drug use: Not on file  . Sexual activity: Not on file  Lifestyle  . Physical activity:    Days per week: Not on file    Minutes per session: Not on file  . Stress: Not on file  Relationships  . Social connections:    Talks on phone: Not on file    Gets together: Not on file    Attends religious service: Not on file    Active member of club or organization: Not on file    Attends meetings of clubs or organizations: Not on file    Relationship status: Not on file  . Intimate partner violence:    Fear of current or ex partner: Not on file    Emotionally abused: Not on file    Physically abused: Not on file    Forced sexual activity: Not on file  Other Topics Concern  . Not on file  Social History Narrative  . Not on file      Review of Systems  All other systems reviewed and are  negative.      Objective:   Physical Exam  Cardiovascular: Normal rate, regular rhythm and normal heart sounds.  Pulmonary/Chest: Effort normal and breath sounds normal. No respiratory distress. He has no wheezes. He has no rales.  Musculoskeletal:       Lumbar back: He exhibits decreased range of motion and tenderness. He exhibits no bony tenderness, no swelling, no edema, no deformity, no pain and no spasm.       Back:  Vitals reviewed.         Assessment & Plan:   Controlled type 2 diabetes mellitus with complication, without long-term current use of insulin (HCC) - Plan: SitaGLIPtin-MetFORMIN HCl (JANUMET XR) 50-1000 MG TB24, COMPLETE METABOLIC PANEL WITH GFR, Hemoglobin A1c  Chronic midline low back pain without sciatica I have recommended scheduled dosing of his pain medication to try to better manage his pain then just chasing the pain medication with the occasional Tylenol.  Therefore I recommended tramadol 50 mg every morning at breakfast, Tylenol ES every day at lunch, tramadol 50 mg at supper, and another extra strength Tylenol at bedtime.  I hope that with scheduled  dosing and spreading the medication out throughout the day that we will achieve better pain control with minimal risk of side effects and that this will facilitate the patient's ability to get up and be more active and enjoy life more.  Recheck in 2 weeks.  If pain is not improving, pain with then be out of proportion to exam, and we may want to consider more in-depth imaging to rule out underlying bone pathology in that area

## 2018-03-17 LAB — COMPLETE METABOLIC PANEL WITH GFR
AG RATIO: 2.1 (calc) (ref 1.0–2.5)
ALBUMIN MSPROF: 4.4 g/dL (ref 3.6–5.1)
ALT: 13 U/L (ref 9–46)
AST: 15 U/L (ref 10–35)
Alkaline phosphatase (APISO): 41 U/L (ref 40–115)
BUN: 8 mg/dL (ref 7–25)
CALCIUM: 9.5 mg/dL (ref 8.6–10.3)
CO2: 26 mmol/L (ref 20–32)
CREATININE: 0.93 mg/dL (ref 0.70–1.11)
Chloride: 100 mmol/L (ref 98–110)
GFR, EST AFRICAN AMERICAN: 88 mL/min/{1.73_m2} (ref >=60)
GFR, EST NON AFRICAN AMERICAN: 76 mL/min/{1.73_m2} (ref >=60)
GLOBULIN: 2.1 g/dL (ref 1.9–3.7)
Glucose, Bld: 331 mg/dL — ABNORMAL HIGH (ref 65–99)
POTASSIUM: 4.4 mmol/L (ref 3.5–5.3)
SODIUM: 137 mmol/L (ref 135–146)
Total Bilirubin: 0.6 mg/dL (ref 0.2–1.2)
Total Protein: 6.5 g/dL (ref 6.1–8.1)

## 2018-03-17 LAB — HEMOGLOBIN A1C
HEMOGLOBIN A1C: 8.9 %{Hb} — AB (ref <5.7)
MEAN PLASMA GLUCOSE: 209 (calc)
eAG (mmol/L): 11.6 (calc)

## 2018-03-23 ENCOUNTER — Other Ambulatory Visit: Payer: Self-pay | Admitting: Family Medicine

## 2018-03-23 ENCOUNTER — Encounter: Payer: Self-pay | Admitting: Family Medicine

## 2018-03-23 MED ORDER — INSULIN PEN NEEDLE 31G X 5 MM MISC: 0 refills | Status: DC

## 2018-03-23 MED ORDER — LIRAGLUTIDE 18 MG/3ML ~~LOC~~ SOPN: 1.2000 mg | PEN_INJECTOR | Freq: Every day | SUBCUTANEOUS | 2 refills | Status: DC

## 2018-03-27 ENCOUNTER — Other Ambulatory Visit: Payer: Self-pay | Admitting: Family Medicine

## 2018-03-29 NOTE — Telephone Encounter (Signed)
Requesting refill    Tramadol  LOV: 03/16/18  LRF:  11/09/17

## 2018-03-30 ENCOUNTER — Other Ambulatory Visit: Payer: Self-pay | Admitting: Family Medicine

## 2018-03-30 DIAGNOSIS — IMO0002 Reserved for concepts with insufficient information to code with codable children: Secondary | ICD-10-CM

## 2018-03-30 DIAGNOSIS — E1165 Type 2 diabetes mellitus with hyperglycemia: Secondary | ICD-10-CM

## 2018-03-30 DIAGNOSIS — E118 Type 2 diabetes mellitus with unspecified complications: Principal | ICD-10-CM

## 2018-04-05 ENCOUNTER — Other Ambulatory Visit: Payer: Self-pay | Admitting: Family Medicine

## 2018-04-05 DIAGNOSIS — E1165 Type 2 diabetes mellitus with hyperglycemia: Secondary | ICD-10-CM

## 2018-04-05 DIAGNOSIS — E118 Type 2 diabetes mellitus with unspecified complications: Principal | ICD-10-CM

## 2018-04-05 DIAGNOSIS — IMO0002 Reserved for concepts with insufficient information to code with codable children: Secondary | ICD-10-CM

## 2018-04-06 ENCOUNTER — Other Ambulatory Visit: Payer: Self-pay | Admitting: Family Medicine

## 2018-04-06 DIAGNOSIS — E1165 Type 2 diabetes mellitus with hyperglycemia: Secondary | ICD-10-CM

## 2018-04-06 DIAGNOSIS — E118 Type 2 diabetes mellitus with unspecified complications: Principal | ICD-10-CM

## 2018-04-06 DIAGNOSIS — IMO0002 Reserved for concepts with insufficient information to code with codable children: Secondary | ICD-10-CM

## 2018-04-22 ENCOUNTER — Telehealth: Payer: Self-pay | Admitting: Family Medicine

## 2018-04-22 NOTE — Telephone Encounter (Signed)
PA Submitted through [email or web address] and received the following:

## 2018-04-23 ENCOUNTER — Encounter: Payer: Self-pay | Admitting: Family Medicine

## 2018-04-23 ENCOUNTER — Ambulatory Visit (INDEPENDENT_AMBULATORY_CARE_PROVIDER_SITE_OTHER): Payer: Medicare HMO | Admitting: Family Medicine

## 2018-04-23 VITALS — BP 112/80 | HR 90 | Temp 97.6°F | Resp 18 | Ht 71.0 in | Wt 188.0 lb

## 2018-04-23 DIAGNOSIS — E118 Type 2 diabetes mellitus with unspecified complications: Secondary | ICD-10-CM | POA: Diagnosis not present

## 2018-04-23 DIAGNOSIS — E1165 Type 2 diabetes mellitus with hyperglycemia: Secondary | ICD-10-CM

## 2018-04-23 DIAGNOSIS — IMO0002 Reserved for concepts with insufficient information to code with codable children: Secondary | ICD-10-CM

## 2018-04-23 DIAGNOSIS — M545 Low back pain: Secondary | ICD-10-CM | POA: Diagnosis not present

## 2018-04-23 DIAGNOSIS — G8929 Other chronic pain: Secondary | ICD-10-CM

## 2018-04-23 MED ORDER — TRAMADOL HCL 50 MG PO TABS: 50.0000 mg | ORAL_TABLET | Freq: Three times a day (TID) | ORAL | 0 refills | Status: DC | PRN

## 2018-04-23 NOTE — Progress Notes (Signed)
Subjective:    Patient ID: Ryan Gilmore, male    DOB: 02/17/1933, 84 y.o.   MRN: [id number]  HPI  10/22/17 Patient presents today with his wife to discuss his chronic low back pain.  Patient saw his orthopedist approximately 10 days ago and I have recommended bilateral L4-L5 facet joint injections as well as L5-S1 facet joint injections due to his degenerative disc disease to see if that will help with his low back pain.  [name] treatment is complicated by his underlying dementia as well.  He continues to endorse low back pain.  His wife states that he is in misery.  He primarily sits in a chair unwilling to stand or move due to the pain.  He points to an area just lateral to his lumbar spine to the right side right above the SI joint.  There is no tenderness to palpation in that area.  He does report decreased range of motion.  He denies any sciatica or leg weakness.  He is tried and failed physical therapy.  I did give him a prescription for tramadol 50 mg tablets to take every 8 hours as needed.  Wife has been given it to him every 8 hours and states that it helped some occasionally and at other times provided no relief.  At that time, the plan was:  I explained to the patient and his wife that I was concerned about increasing the strength of his pain medication possibly exacerbating his dementia and also increasing his fall risk.  I explained to his wife that she could use the tramadol every 6 hours as needed.  If he is having severe pain, she did could increase to 100 mg sparingly.  I cautioned her against regular use unless the pain is moderate to severe due to the increased risk of falls and confusion area he has a cortisone injection scheduled for 10 days from now.  Hopefully that will provide him relief as well  10/29/17 Patient recently went to the emergency room complaining of poor appetite and weight loss. Urinalysis showed pyuria. He was started on Keflex for possible urinary tract  infection. Urine culture returned recently showing Enterococcus faecalis. However he was directed to stop his antibiotic by the emergency room today have not taken any further doses of his antibiotic. His wife states that he is now extremely nauseated. He is losing weight. He is not eating. She denies any fevers or chills. He continues to complain of low back pain. He denies any dysuria or hematuria or urgency or frequency. However he is unable to provide an accurate history due to dementia. He does report no appetite, early satiety, weakness, and nausea.  Recently began ozempic at [place].  At that time, my plan was: I am concerned about dehydration. He has lost 4 pounds since I last saw him. I want him to discontinue lisinopril. I also want him to hold Amaryl, metformin because he is not taking adequate by mouth nutrition and I'm concerned about hypoglycemia. Also want him to temporarily discontinue ozempic as this could be affecting his appetite and making him nauseated. Meanwhile start Zofran 4 mg by mouth every 8 hours when necessary nausea and go for an x-ray of the abdomen to rule out bowel obstruction or ileus or fecal impaction. I also believe that some of his nausea and weakness could be due to undiagnosed prostatitis. In an 84 year old man, a simple uncomplicated UTI is very unlikely. For him to have silent symptoms, I would  be concerned that this could be from prostatitis. Therefore I recommended Cipro 500 mg by mouth twice a day for 10 days given the culture confirmed urinary tract infection. Recheck on Monday  11/02/17 The x-ray confirmed a moderate amount of constipation and stool throughout the colon. Family was recommended to use MiraLAX as it was thought that the constipation could be contributing to his poor intake. MiraLAX was ineffective. They did give him 1 dose of linzess which seemed to work better.  [name] weight is up 4 pounds today compared to his last visit. His color looks better. He  has more energy. He is smiling. He denies any abdominal pain or nausea. However his history is limited by his dementia. His wife states that he still not eating very well and he continues to complain of low back pain.  At that time, my plan was: I believe the patient has turned the corner and starting to show signs of improvement. I recommended staying on linzess 145 mcg poqday for constipation unless he develops diarrhea. Continue to refrain from using ozempic as I believe this could be causing his nausea and poor appetite. He truly is starting to appear better. His next dose of the medicine was due on Saturday so really he's only been off the medication for one or 2 days. I would like to see if he continues to improve over the next week off the medication particularly as his prostatitis improves and his constipation improves. Recheck on Monday of next week.  11/09/17 Wt Readings from Last 3 Encounters:  03/16/18 190 lb (86.2 kg)  02/16/18 190 lb (86.2 kg)  11/26/17 185 lb (83.9 kg)   Clinically, patient seems to be doing much better.  He is gained 7 pounds in the last week.  His appetite is better.  He is eating and drinking more.  His constipation is better on linzess.  He denies any dysuria.  Wife still complains that he is complaining of low back pain.  He is scheduled for epidural steroid injection on February 18 now which may help with this.  She still complains that his energy level and drive is also lower than normal.  However I believe a lot of this could be dementia related.  Patient states that he feels fine and he denies any complaints today.  He is smiling and interactive and appears healthy and well-nourished.  At that time, my plan was:  Clinically the patient looks much better.  His nausea and weight loss has completely resolved.  I believe his poor appetite was likely secondary to ozempic.  I have recommended today discontinue that medication permanently.  I would like him to resume  metformin as his appetite has improved.  I have recommended that his wife monitor his blood sugars.  If his blood sugars rise greater than 200, I would then resume glimepiride.  However I want her to keep him away from the glimepiride at the present time.  Stay on linzess 72 mcg p.o. daily for chronic constipation.  Repeat urinalysis and urine culture to ensure resolution of prostatitis.  Clinically the patient appears to be back to his baseline.  11/26/17 The patient's wife is starting to see elevated blood sugars.  His blood sugar this morning was 220 fasting and shortly after lunch, it was greater than 300.  He denies any polyuria, polydipsia, or blurry vision.  He has been taking Actos.  He is also resumed his metformin however he is still off glimepiride as well as  ozempic as stated in earlier office visits.  AT that time, my plan was: Resume glimepiride and recheck blood sugars in 1 week.  Ultimately I would like to keep his blood sugars between 100-200 is given his age and medical comorbidities.  If not in that range, patient would then benefit from adding [place].  02/16/18 Patient is here today with his wife.  She reports blood sugars between 203 100 fasting in the morning.  She states that she is giving him metformin 500 twice a day despite the fact I stopped the medication earlier this year.  She is also giving him Amaryl and Actos as prescribed.  Sugars typically run between 200-250 most days.  She is also concerned because all he does is sit around and take naps.  He has no drive.  He has no motivation.  She will tell him to cut the grass and he states later and then forgets and never does it.  She tells him to shave, he will tell her later and then forget and never does.  She will ask him to go to town or go shopping with her, he will state later and then simply take a nap.  Due to his dementia, he is unable to provide additional history.  At that time, my plan was: Discontinue metformin and  replace with Janumet XR 50/1000, 2 tablets p.o. every morning.  Recheck blood sugars in 1 month.  Goal blood sugars are between 100-200.  Consider treating the patient for depression.  I believe his apathy is a combination of age, memory loss, and may be some mild underlying depression.  Consider adding low-dose SSRI at next visit to see if this will help  03/16/18 [name] wife states that his sugars have improved on Janumet.  She is still given him 2 tablets of this every morning in addition to Actos and glipizide.  Blood sugars are under 200 when she checks them.  She denies any polyuria, polydipsia, or blurry vision.  However she states that the patient is constantly complaining about pain in his lower back.  Pain is located around the level of L5 and is to the right of midline.  There is no palpable nodule or mass in that area.  He denies any numbness or tingling in his legs.  He denies any weakness in his legs.  He denies any bowel or bladder incontinence.  Work-up has included a referral to an orthopedist, as well as an x-ray of the lumbar spine his results are dictated below: 2 views of the lumbar spine show no acute findings in terms of fractures  or malalignment. There is degenerative changes but is only mild to  moderate at several levels but disc heights are decently maintained and  alignment is decently maintained.  At that time, my plan was: I have recommended scheduled dosing of his pain medication to try to better manage his pain then just chasing the pain medication with the occasional Tylenol.  Therefore I recommended tramadol 50 mg every morning at breakfast, Tylenol ES every day at lunch, tramadol 50 mg at supper, and another extra strength Tylenol at bedtime.  I hope that with scheduled dosing and spreading the medication out throughout the day that we will achieve better pain control with minimal risk of side effects and that this will facilitate the patient's ability to get up and  be more active and enjoy life more.  Recheck in 2 weeks.  If pain is not improving, pain with then be  out of proportion to exam, and we may want to consider more in-depth imaging to rule out underlying bone pathology in that area  04/23/18 [name] hemoglobin A1c was 8.9 and his last visit on June 11.  His random sugar was over 300.  Therefore I recommended that we add Victoza 1.2 mg subcu daily to his regimen and recheck his blood sugars in 1 month.  They are here today to reassess.  Unfortunately the patient's dementia and the patient's wife's dementia make it extremely difficult.  I began our conversation by asking how his sugars are doing.  Wife states that they are out of control.  She states that his sugars are in the 200s.  However upon further questioning, the patient's wife states that she is not actually checking his sugars that he is checking his sugars himself and reporting them to her.  When asked to see his glucometer and review the memory, there is only 3 sugars since I last saw the patient.  The last dated blood sugar was on June 17.  He had not started Vick toes until 2 days prior to that.  There have been no blood sugars fasting are otherwise for the last month.  Therefore it is impossible to determine how well his sugars are being managed on the Victoza.  [name] wife also states that he is in constant pain in his back.  Please see above the discussion of this.  However I question her as to how she is getting his pain medication.  She forgot that he had pain medication and is only giving it when he requests it.  She is not doing the scheduled strategy that we had discussed at his last visit due to the fact that she forgot our discussion.  She too is suffering from dementia Past Medical History:  Diagnosis Date  . Bleeding duodenal ulcer    ?  1980s  . Dementia   . Diabetes mellitus without complication ([place])   . Hyperlipidemia    Past Surgical History:  Procedure Laterality Date  .  APPENDECTOMY     Current Outpatient Medications on File Prior to Visit  Medication Sig Dispense Refill  . donepezil (ARICEPT) 5 MG tablet TAKE 1 TABLET BY MOUTH AT BEDTIME 90 tablet 3  . glimepiride (AMARYL) 4 MG tablet Take 1 tablet (4 mg total) by mouth daily. 90 tablet 1  . glucose blood (ACCU-CHEK SMARTVIEW) test strip Use as instructed to check blood sugar 2 times per day dx code E11.65 100 each 5  . Insulin Pen Needle 31G X 5 MM MISC Use with victoza 100 each 0  . liraglutide (VICTOZA) 18 MG/3ML SOPN Inject 0.2 mLs (1.2 mg total) into the skin daily. 2 pen 2  . lisinopril (PRINIVIL,ZESTRIL) 10 MG tablet TAKE 1 TABLET BY MOUTH ONCE DAILY 90 tablet 3  . meloxicam (MOBIC) 15 MG tablet TAKE 1 TABLET BY MOUTH  DAILY 90 tablet 3  . pioglitazone (ACTOS) 30 MG tablet TAKE 1 TABLET BY MOUTH ONCE DAILY 90 tablet 1  . pravastatin (PRAVACHOL) 40 MG tablet TAKE 1 TABLET BY MOUTH ONCE DAILY 90 tablet 2  . SitaGLIPtin-MetFORMIN HCl (JANUMET XR) 50-1000 MG TB24 Take 2 tablets by mouth daily. 60 tablet 5  . traMADol (ULTRAM) 50 MG tablet Take 1 tablet (50 mg total) by mouth every 8 (eight) hours as needed. 60 tablet 0  . traMADol (ULTRAM) 50 MG tablet Take 1 tablet (50 mg total) by mouth every 8 (eight) hours as needed  for moderate pain or severe pain. 30 tablet 0   No current facility-administered medications on file prior to visit.    No Known Allergies Social History   Socioeconomic History  . Marital status: Married    Spouse name: Not on file  . Number of children: Not on file  . Years of education: Not on file  . Highest education level: Not on file  Occupational History  . Not on file  Social Needs  . Financial resource strain: Not on file  . Food insecurity:    Worry: Not on file    Inability: Not on file  . Transportation needs:    Medical: Not on file    Non-medical: Not on file  Tobacco Use  . Smoking status: Never Smoker  . Smokeless tobacco: Current User    Types: Chew    Substance and Sexual Activity  . Alcohol use: Not on file  . Drug use: Not on file  . Sexual activity: Not on file  Lifestyle  . Physical activity:    Days per week: Not on file    Minutes per session: Not on file  . Stress: Not on file  Relationships  . Social connections:    Talks on phone: Not on file    Gets together: Not on file    Attends religious service: Not on file    Active member of club or organization: Not on file    Attends meetings of clubs or organizations: Not on file    Relationship status: Not on file  . Intimate partner violence:    Fear of current or ex partner: Not on file    Emotionally abused: Not on file    Physically abused: Not on file    Forced sexual activity: Not on file  Other Topics Concern  . Not on file  Social History Narrative  . Not on file      Review of Systems  All other systems reviewed and are negative.      Objective:   Physical Exam  Cardiovascular: Normal rate, regular rhythm and normal heart sounds.  Pulmonary/Chest: Effort normal and breath sounds normal. No respiratory distress. He has no wheezes. He has no rales.  Musculoskeletal:       Lumbar back: He exhibits decreased range of motion and tenderness. He exhibits no bony tenderness, no swelling, no edema, no deformity, no pain and no spasm.       Back:  Vitals reviewed.         Assessment & Plan:   Chronic midline low back pain without sciatica - Plan: MR Lumbar Spine Wo Contrast  Uncontrolled type 2 diabetes mellitus with complication, without long-term current use of insulin ([place])  I have asked the patient's wife to begin checking his sugars for him and recording the fasting and 2-hour postprandial sugars for 1 week and then contact me in 1 week so we can determine how well the toes is managing his sugars.  In general his sugars are between 102 100 I am satisfied.  Regarding his low back pain, I have recommended that she perform the scheduled pain medication  that we had discussed, as tramadol in the morning, Tylenol at lunch, tramadol at dinnertime, and another Tylenol in the evening.  Meanwhile I will schedule the patient for an MRI given the fact his back pain has been present now for almost a year, it is not improving despite conservative measures

## 2018-04-27 MED ORDER — LIRAGLUTIDE 18 MG/3ML ~~LOC~~ SOPN: 1.2000 mg | PEN_INJECTOR | Freq: Every day | SUBCUTANEOUS | 2 refills | Status: DC

## 2018-04-27 NOTE — Telephone Encounter (Signed)
Victoza approved through insurance until 04/22/2019 - pharm made aware

## 2018-05-04 ENCOUNTER — Other Ambulatory Visit: Payer: Medicare HMO

## 2018-06-07 IMAGING — CR DG LUMBAR SPINE COMPLETE 4+V
5 series · 5 of 5 positions shown · non-contrast
Comparison: None.

CLINICAL DATA: Chronic low back pain

EXAM:
LUMBAR SPINE - COMPLETE 4+ VIEW

[w lumbar spine ap]
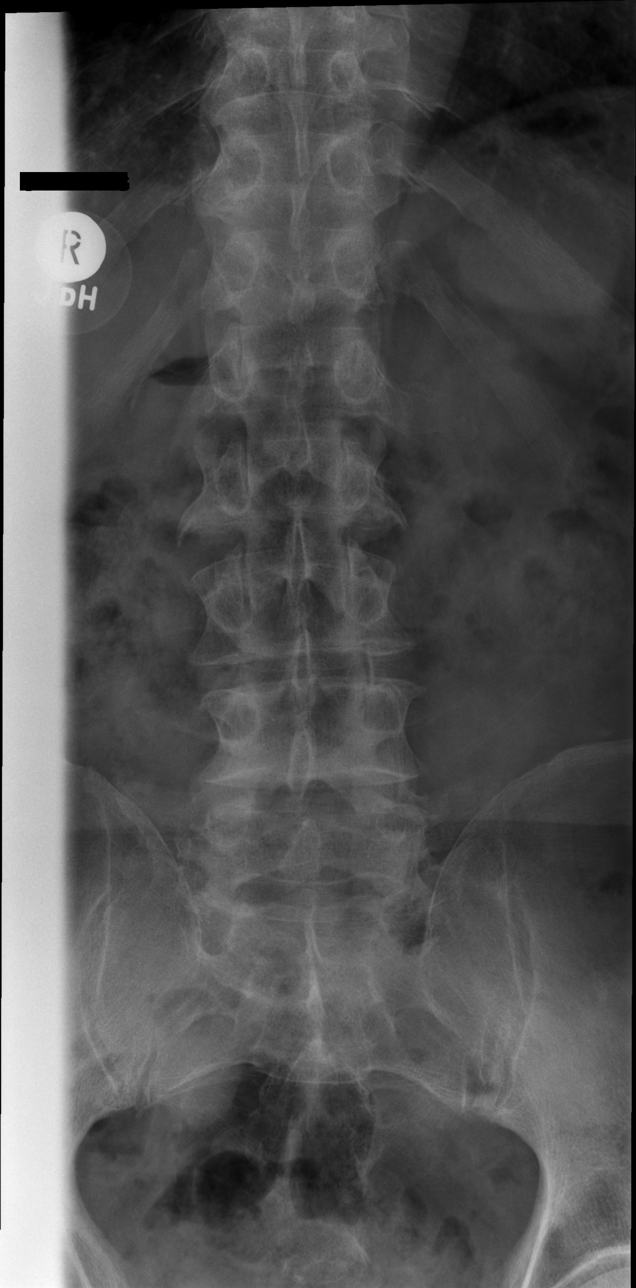

[w lumbar spine obl (1 of 2)]
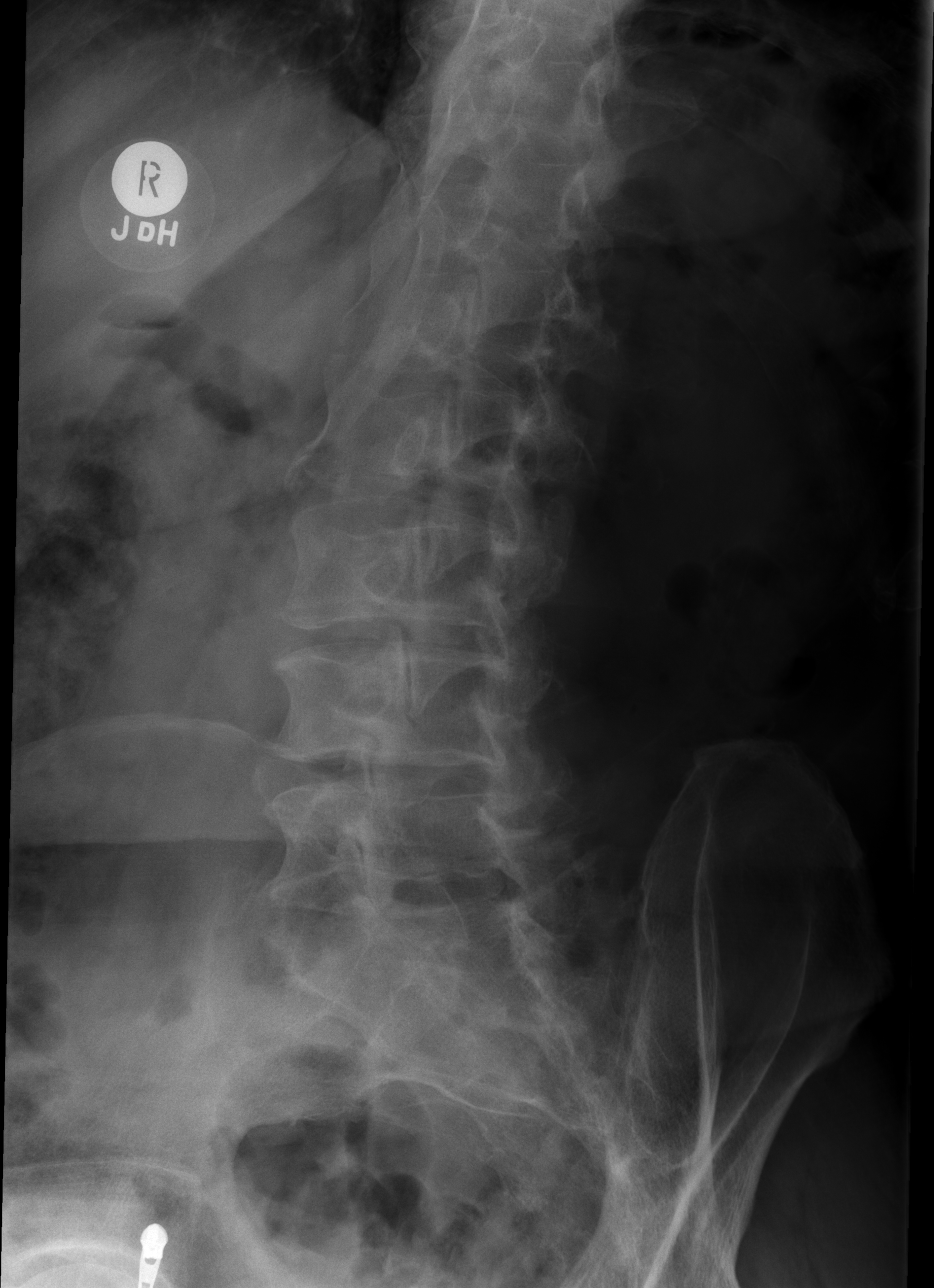

[w lumbar spine obl (2 of 2)]
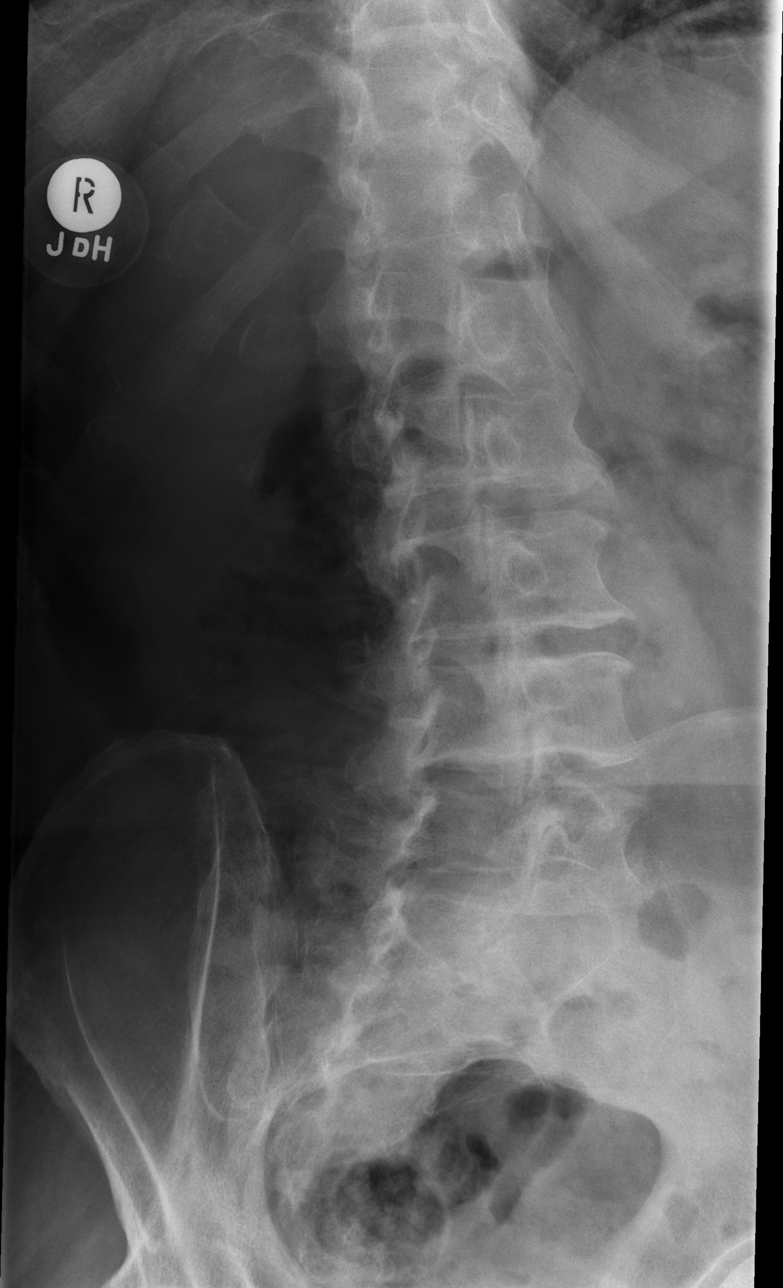

[w lumbar spine lat]
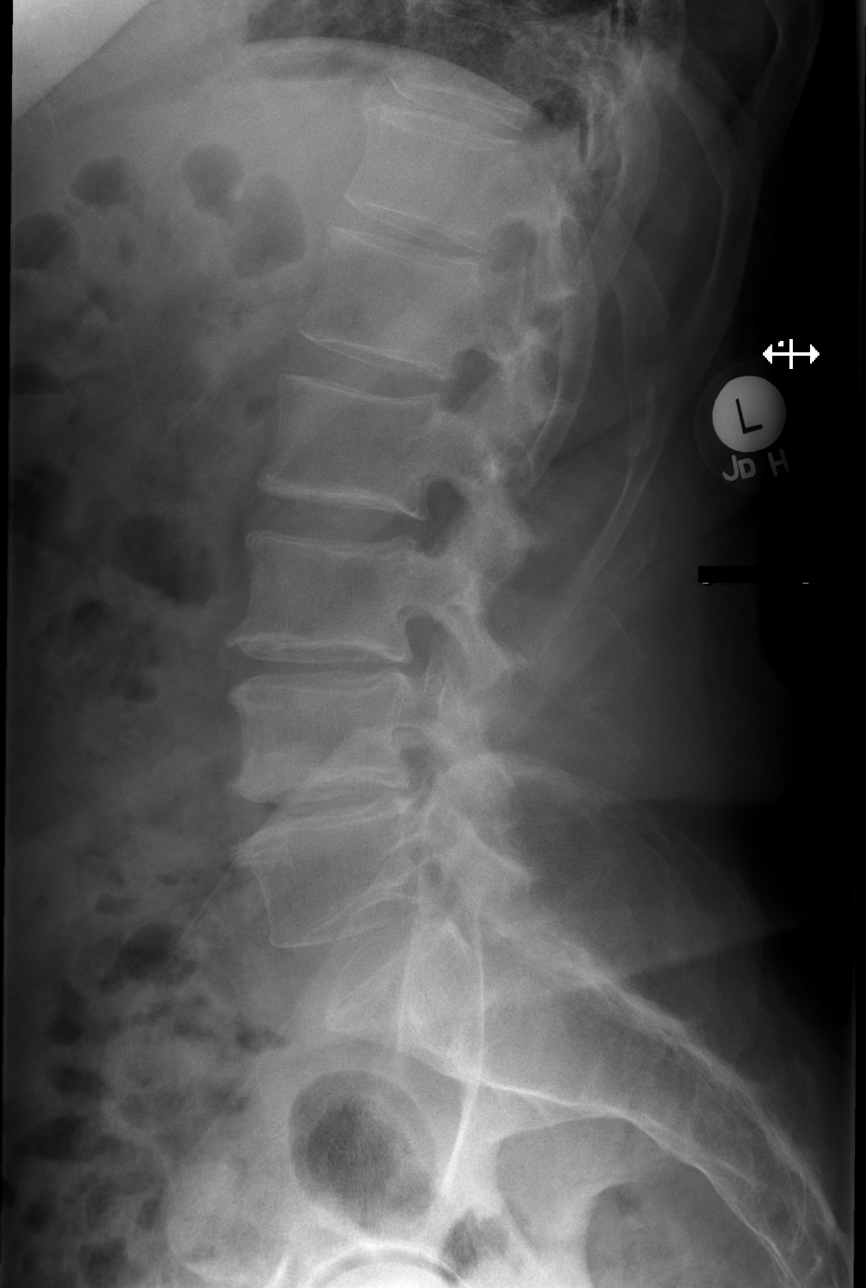

[w lumbar l-5 s-1 spot]
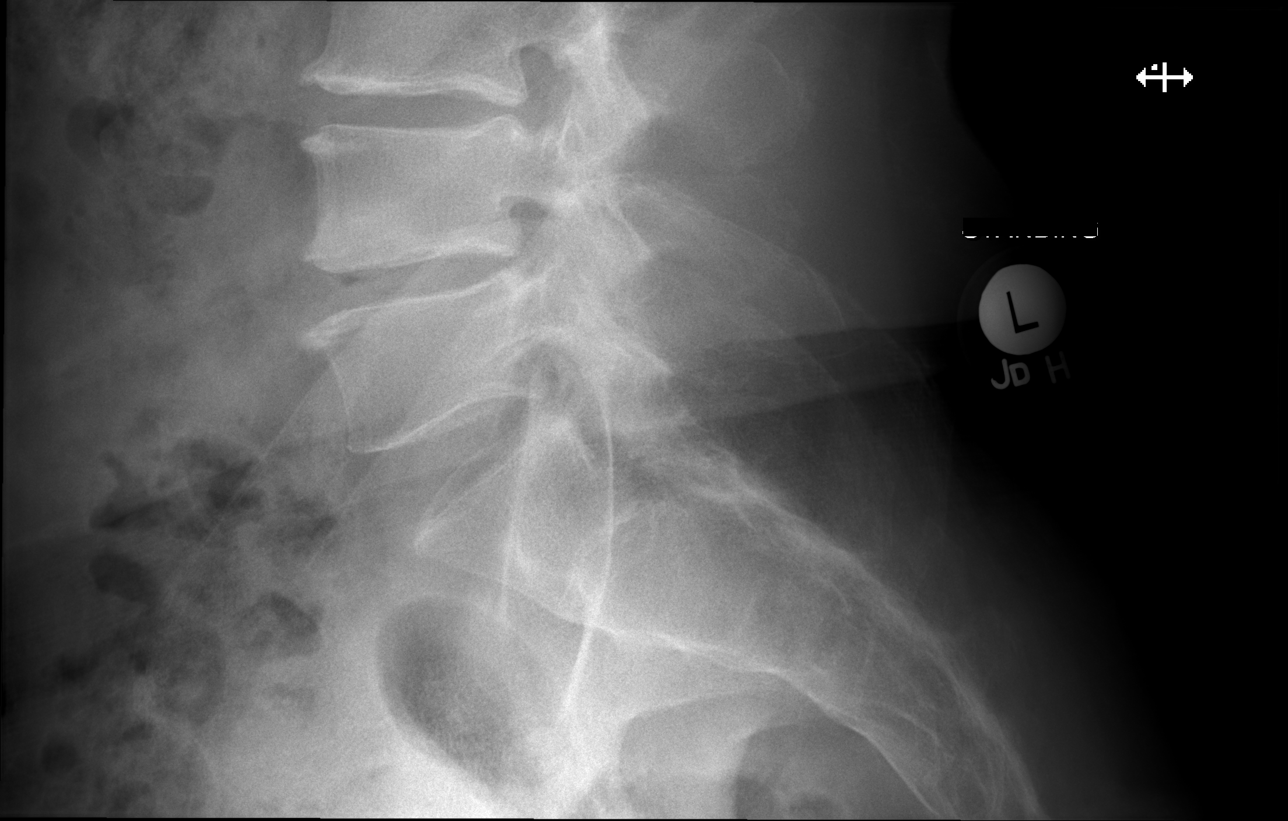

[5 of 5 positions shown; findings below may reference images not displayed]

FINDINGS: There is no evidence of lumbar spine fracture. Alignment is normal.
Intervertebral disc spaces are narrowed at L3-4 and L4-5. Sacroiliac
joints are unremarkable bilaterally. Multilevel facet joint
sclerosis and joint space narrowing most prominent at L4-5 and
L5-S1. No spondylolysis nor spondylolisthesis.
IMPRESSION: No acute osseous abnormality. L3 through L5 degenerative disc
disease. Multilevel degenerative facet arthropathy most prominent at
L4-5 and L5-S1.

## 2018-06-10 ENCOUNTER — Encounter: Payer: Self-pay | Admitting: Family Medicine

## 2018-06-10 ENCOUNTER — Ambulatory Visit
Admission: RE | Admit: 2018-06-10 | Discharge: 2018-06-10 | Disposition: A | Discharge status: Home / Self Care | Payer: Medicare HMO | Source: Ambulatory Visit | Attending: Family Medicine | Admitting: Family Medicine

## 2018-06-10 ENCOUNTER — Ambulatory Visit (INDEPENDENT_AMBULATORY_CARE_PROVIDER_SITE_OTHER): Payer: Medicare HMO | Admitting: Family Medicine

## 2018-06-10 VITALS — BP 118/68 | HR 84 | Temp 97.7°F | Resp 14 | Ht 70.5 in | Wt 186.0 lb

## 2018-06-10 DIAGNOSIS — M545 Low back pain: Secondary | ICD-10-CM

## 2018-06-10 DIAGNOSIS — M5136 Other intervertebral disc degeneration, lumbar region: Secondary | ICD-10-CM | POA: Diagnosis not present

## 2018-06-10 DIAGNOSIS — G8929 Other chronic pain: Secondary | ICD-10-CM

## 2018-06-10 NOTE — Progress Notes (Signed)
Subjective:    Patient ID: Ryan Gilmore, male    DOB: 1933-01-21, 84 y.o.   MRN: [id number]  HPI  10/22/17 Patient presents today with his wife to discuss his chronic low back pain.  Patient saw his orthopedist approximately 10 days ago and I have recommended bilateral L4-L5 facet joint injections as well as L5-S1 facet joint injections due to his degenerative disc disease to see if that will help with his low back pain.  [name] treatment is complicated by his underlying dementia as well.  He continues to endorse low back pain.  His wife states that he is in misery.  He primarily sits in a chair unwilling to stand or move due to the pain.  He points to an area just lateral to his lumbar spine to the right side right above the SI joint.  There is no tenderness to palpation in that area.  He does report decreased range of motion.  He denies any sciatica or leg weakness.  He is tried and failed physical therapy.  I did give him a prescription for tramadol 50 mg tablets to take every 8 hours as needed.  Wife has been given it to him every 8 hours and states that it helped some occasionally and at other times provided no relief.  At that time, the plan was:  I explained to the patient and his wife that I was concerned about increasing the strength of his pain medication possibly exacerbating his dementia and also increasing his fall risk.  I explained to his wife that she could use the tramadol every 6 hours as needed.  If he is having severe pain, she did could increase to 100 mg sparingly.  I cautioned her against regular use unless the pain is moderate to severe due to the increased risk of falls and confusion area he has a cortisone injection scheduled for 10 days from now.  Hopefully that will provide him relief as well  10/29/17 Patient recently went to the emergency room complaining of poor appetite and weight loss. Urinalysis showed pyuria. He was started on Keflex for possible urinary tract  infection. Urine culture returned recently showing Enterococcus faecalis. However he was directed to stop his antibiotic by the emergency room today have not taken any further doses of his antibiotic. His wife states that he is now extremely nauseated. He is losing weight. He is not eating. She denies any fevers or chills. He continues to complain of low back pain. He denies any dysuria or hematuria or urgency or frequency. However he is unable to provide an accurate history due to dementia. He does report no appetite, early satiety, weakness, and nausea.  Recently began ozempic at [place].  At that time, my plan was: I am concerned about dehydration. He has lost 4 pounds since I last saw him. I want him to discontinue lisinopril. I also want him to hold Amaryl, metformin because he is not taking adequate by mouth nutrition and I'm concerned about hypoglycemia. Also want him to temporarily discontinue ozempic as this could be affecting his appetite and making him nauseated. Meanwhile start Zofran 4 mg by mouth every 8 hours when necessary nausea and go for an x-ray of the abdomen to rule out bowel obstruction or ileus or fecal impaction. I also believe that some of his nausea and weakness could be due to undiagnosed prostatitis. In an 84 year old man, a simple uncomplicated UTI is very unlikely. For him to have silent symptoms, I would  be concerned that this could be from prostatitis. Therefore I recommended Cipro 500 mg by mouth twice a day for 10 days given the culture confirmed urinary tract infection. Recheck on Monday  11/02/17 The x-ray confirmed a moderate amount of constipation and stool throughout the colon. Family was recommended to use MiraLAX as it was thought that the constipation could be contributing to his poor intake. MiraLAX was ineffective. They did give him 1 dose of linzess which seemed to work better.  [name] weight is up 4 pounds today compared to his last visit. His color looks better. He  has more energy. He is smiling. He denies any abdominal pain or nausea. However his history is limited by his dementia. His wife states that he still not eating very well and he continues to complain of low back pain.  At that time, my plan was: I believe the patient has turned the corner and starting to show signs of improvement. I recommended staying on linzess 145 mcg poqday for constipation unless he develops diarrhea. Continue to refrain from using ozempic as I believe this could be causing his nausea and poor appetite. He truly is starting to appear better. His next dose of the medicine was due on Saturday so really he's only been off the medication for one or 2 days. I would like to see if he continues to improve over the next week off the medication particularly as his prostatitis improves and his constipation improves. Recheck on Monday of next week.  11/09/17 Wt Readings from Last 3 Encounters:  06/10/18 186 lb (84.4 kg)  04/23/18 188 lb (85.3 kg)  03/16/18 190 lb (86.2 kg)   Clinically, patient seems to be doing much better.  He is gained 7 pounds in the last week.  His appetite is better.  He is eating and drinking more.  His constipation is better on linzess.  He denies any dysuria.  Wife still complains that he is complaining of low back pain.  He is scheduled for epidural steroid injection on February 18 now which may help with this.  She still complains that his energy level and drive is also lower than normal.  However I believe a lot of this could be dementia related.  Patient states that he feels fine and he denies any complaints today.  He is smiling and interactive and appears healthy and well-nourished.  At that time, my plan was:  Clinically the patient looks much better.  His nausea and weight loss has completely resolved.  I believe his poor appetite was likely secondary to ozempic.  I have recommended today discontinue that medication permanently.  I would like him to resume  metformin as his appetite has improved.  I have recommended that his wife monitor his blood sugars.  If his blood sugars rise greater than 200, I would then resume glimepiride.  However I want her to keep him away from the glimepiride at the present time.  Stay on linzess 72 mcg p.o. daily for chronic constipation.  Repeat urinalysis and urine culture to ensure resolution of prostatitis.  Clinically the patient appears to be back to his baseline.  11/26/17 The patient's wife is starting to see elevated blood sugars.  His blood sugar this morning was 220 fasting and shortly after lunch, it was greater than 300.  He denies any polyuria, polydipsia, or blurry vision.  He has been taking Actos.  He is also resumed his metformin however he is still off glimepiride as well as  ozempic as stated in earlier office visits.  AT that time, my plan was: Resume glimepiride and recheck blood sugars in 1 week.  Ultimately I would like to keep his blood sugars between 100-200 is given his age and medical comorbidities.  If not in that range, patient would then benefit from adding [place].  02/16/18 Patient is here today with his wife.  She reports blood sugars between 203 100 fasting in the morning.  She states that she is giving him metformin 500 twice a day despite the fact I stopped the medication earlier this year.  She is also giving him Amaryl and Actos as prescribed.  Sugars typically run between 200-250 most days.  She is also concerned because all he does is sit around and take naps.  He has no drive.  He has no motivation.  She will tell him to cut the grass and he states later and then forgets and never does it.  She tells him to shave, he will tell her later and then forget and never does.  She will ask him to go to town or go shopping with her, he will state later and then simply take a nap.  Due to his dementia, he is unable to provide additional history.  At that time, my plan was: Discontinue metformin and  replace with Janumet XR 50/1000, 2 tablets p.o. every morning.  Recheck blood sugars in 1 month.  Goal blood sugars are between 100-200.  Consider treating the patient for depression.  I believe his apathy is a combination of age, memory loss, and may be some mild underlying depression.  Consider adding low-dose SSRI at next visit to see if this will help  03/16/18 [name] wife states that his sugars have improved on Janumet.  She is still given him 2 tablets of this every morning in addition to Actos and glipizide.  Blood sugars are under 200 when she checks them.  She denies any polyuria, polydipsia, or blurry vision.  However she states that the patient is constantly complaining about pain in his lower back.  Pain is located around the level of L5 and is to the right of midline.  There is no palpable nodule or mass in that area.  He denies any numbness or tingling in his legs.  He denies any weakness in his legs.  He denies any bowel or bladder incontinence.  Work-up has included a referral to an orthopedist, as well as an x-ray of the lumbar spine his results are dictated below: 2 views of the lumbar spine show no acute findings in terms of fractures  or malalignment. There is degenerative changes but is only mild to  moderate at several levels but disc heights are decently maintained and  alignment is decently maintained.  At that time, my plan was: I have recommended scheduled dosing of his pain medication to try to better manage his pain then just chasing the pain medication with the occasional Tylenol.  Therefore I recommended tramadol 50 mg every morning at breakfast, Tylenol ES every day at lunch, tramadol 50 mg at supper, and another extra strength Tylenol at bedtime.  I hope that with scheduled dosing and spreading the medication out throughout the day that we will achieve better pain control with minimal risk of side effects and that this will facilitate the patient's ability to get up and  be more active and enjoy life more.  Recheck in 2 weeks.  If pain is not improving, pain with then be  out of proportion to exam, and we may want to consider more in-depth imaging to rule out underlying bone pathology in that area  04/23/18 [name] hemoglobin A1c was 8.9 and his last visit on June 11.  His random sugar was over 300.  Therefore I recommended that we add Victoza 1.2 mg subcu daily to his regimen and recheck his blood sugars in 1 month.  They are here today to reassess.  Unfortunately the patient's dementia and the patient's wife's dementia make it extremely difficult.  I began our conversation by asking how his sugars are doing.  Wife states that they are out of control.  She states that his sugars are in the 200s.  However upon further questioning, the patient's wife states that she is not actually checking his sugars that he is checking his sugars himself and reporting them to her.  When asked to see his glucometer and review the memory, there is only 3 sugars since I last saw the patient.  The last dated blood sugar was on June 17.  He had not started Vick toes until 2 days prior to that.  There have been no blood sugars fasting are otherwise for the last month.  Therefore it is impossible to determine how well his sugars are being managed on the Victoza.  [name] wife also states that he is in constant pain in his back.  Please see above the discussion of this.  However I question her as to how she is getting his pain medication.  She forgot that he had pain medication and is only giving it when he requests it.  She is not doing the scheduled strategy that we had discussed at his last visit due to the fact that she forgot our discussion.  She too is suffering from dementia.  At that time, my plan was: I have asked the patient's wife to begin checking his sugars for him and recording the fasting and 2-hour postprandial sugars for 1 week and then contact me in 1 week so we can determine how  well the toes is managing his sugars.  In general his sugars are between 102 100 I am satisfied.  Regarding his low back pain, I have recommended that she perform the scheduled pain medication that we had discussed, as tramadol in the morning, Tylenol at lunch, tramadol at dinnertime, and another Tylenol in the evening.  Meanwhile I will schedule the patient for an MRI given the fact his back pain has been present now for almost a year, it is not improving despite conservative measures  06/10/18 Originally insurance declined his MRI however we were able to get it approved.  MRI was scheduled for July 30.  Apparently the patient did not keep the appointment and never went for the MRI.  He is here today with his wife.  She is stating that he is in constant pain.  When I examined the patient however he is smiling.  He is laughing as I discussed his pain with him.  He is able to get off the exam table and walk up and down the hallway with no obvious signs of pain.  There is mild tenderness to palpation in the lumbar paraspinal muscles around the level of L4 however it is very mild.  When I question the patient he states is a nagging pain.  He states that is not severe.  His dementia makes history difficult however I presented him with a scale of 1-10 and gave examples of what  would constitute a 1 and what would constitute a 10.  He states that his pain is closer to a 1.  Muscle strength is 5/5 equal and symmetric in the upper and lower extremities.  Reflexes are normal.  There is no evidence of cauda equina syndrome.  He is denying any fevers or chills.  When I asked his wife why she feels that he is in constant pain, she states that whenever she asked him to do anything, he states no my back hurts.  It seems like this may be a easy way for him to avoid doing or going places that he does not want to go.  I am concerned that his dementia may cause him to state this even when he is not in severe pain.  I am also  concerned about the wife's dementia as she obviously forgot the MRI and also does not recall having this discussion on several occasions. Past Medical History:  Diagnosis Date  . Bleeding duodenal ulcer    ?  1980s  . Dementia   . Diabetes mellitus without complication ([place])   . Hyperlipidemia    Past Surgical History:  Procedure Laterality Date  . APPENDECTOMY     Current Outpatient Medications on File Prior to Visit  Medication Sig Dispense Refill  . donepezil (ARICEPT) 5 MG tablet TAKE 1 TABLET BY MOUTH AT BEDTIME 90 tablet 3  . glimepiride (AMARYL) 4 MG tablet Take 1 tablet (4 mg total) by mouth daily. 90 tablet 1  . glucose blood (ACCU-CHEK SMARTVIEW) test strip Use as instructed to check blood sugar 2 times per day dx code E11.65 100 each 5  . Insulin Pen Needle 31G X 5 MM MISC Use with victoza 100 each 0  . liraglutide (VICTOZA) 18 MG/3ML SOPN Inject 0.2 mLs (1.2 mg total) into the skin daily. 2 pen 2  . lisinopril (PRINIVIL,ZESTRIL) 10 MG tablet TAKE 1 TABLET BY MOUTH ONCE DAILY 90 tablet 3  . meloxicam (MOBIC) 15 MG tablet TAKE 1 TABLET BY MOUTH  DAILY 90 tablet 3  . pioglitazone (ACTOS) 30 MG tablet TAKE 1 TABLET BY MOUTH ONCE DAILY 90 tablet 1  . pravastatin (PRAVACHOL) 40 MG tablet TAKE 1 TABLET BY MOUTH ONCE DAILY 90 tablet 2  . traMADol (ULTRAM) 50 MG tablet Take 1 tablet (50 mg total) by mouth every 8 (eight) hours as needed for moderate pain or severe pain. 30 tablet 0   No current facility-administered medications on file prior to visit.    No Known Allergies Social History   Socioeconomic History  . Marital status: Married    Spouse name: Not on file  . Number of children: Not on file  . Years of education: Not on file  . Highest education level: Not on file  Occupational History  . Not on file  Social Needs  . Financial resource strain: Not on file  . Food insecurity:    Worry: Not on file    Inability: Not on file  . Transportation needs:    Medical:  Not on file    Non-medical: Not on file  Tobacco Use  . Smoking status: Never Smoker  . Smokeless tobacco: Current User    Types: Chew  Substance and Sexual Activity  . Alcohol use: Not on file  . Drug use: Not on file  . Sexual activity: Not on file  Lifestyle  . Physical activity:    Days per week: Not on file    Minutes per session:  Not on file  . Stress: Not on file  Relationships  . Social connections:    Talks on phone: Not on file    Gets together: Not on file    Attends religious service: Not on file    Active member of club or organization: Not on file    Attends meetings of clubs or organizations: Not on file    Relationship status: Not on file  . Intimate partner violence:    Fear of current or ex partner: Not on file    Emotionally abused: Not on file    Physically abused: Not on file    Forced sexual activity: Not on file  Other Topics Concern  . Not on file  Social History Narrative  . Not on file      Review of Systems  Musculoskeletal: Positive for back pain.  All other systems reviewed and are negative.      Objective:   Physical Exam  Cardiovascular: Normal rate, regular rhythm and normal heart sounds.  Pulmonary/Chest: Effort normal and breath sounds normal. No respiratory distress. He has no wheezes. He has no rales.  Musculoskeletal:       Lumbar back: He exhibits decreased range of motion. He exhibits no tenderness, no bony tenderness, no swelling, no edema, no deformity, no pain and no spasm.  Vitals reviewed.         Assessment & Plan:   Chronic midline low back pain, with sciatica presence unspecified - Plan: DG Lumbar Spine Complete  [name] exam today is completely benign.  In fact he smiles when I questioned him about his back pain and laughs at times when he describes his pain.  It seems like the pain is very minimal and certainly is not causing him terrible discomfort.  I do believe he has a constant low-grade nagging pain in  his back likely due to his arthritic changes.  However I believe that he uses this is a "easy excuse" when he does not want to do things that his wife asked him to do primarily because of his dementia and his inability to express himself.  In any regards, he does not seem to be in any pain today and certainly not to the point that I would prescribe him opiates or recommend surgery.  I will reschedule his MRI that he previously did not keep his appointment for.  I will also obtain an x-ray to rule out acute findings that would explain constant low back pain such as bony cancers or fractures.  I try to reassure his wife that we may have to live with a low-grade level of pain that he is experiencing.

## 2018-06-12 ENCOUNTER — Other Ambulatory Visit: Payer: Self-pay | Admitting: Family Medicine

## 2018-06-14 NOTE — Telephone Encounter (Signed)
Requesting refill    Tramadol  LOV: 06/10/18  LRF:   03/29/18

## 2018-06-18 ENCOUNTER — Telehealth: Payer: Self-pay | Admitting: Family Medicine

## 2018-06-18 NOTE — Telephone Encounter (Signed)
Pt's wife came in and states that he is in the doughnut hole and his victoza is now $60 and they can not afford that? Recommendations? I did give them a sample pen to tide him over until you returned.

## 2018-06-21 NOTE — Telephone Encounter (Signed)
I do not see metformin on his med list.  Verify that he is not on metformin because his wife gets mildly confused about his medicine as well. If not on metformin, he could replace victoza with metformin 500 mg bid and increase to 1000 BID after two weeks.

## 2018-06-23 ENCOUNTER — Ambulatory Visit
Admission: RE | Admit: 2018-06-23 | Discharge: 2018-06-23 | Disposition: A | Discharge status: Home / Self Care | Payer: Medicare HMO | Source: Ambulatory Visit | Attending: Family Medicine | Admitting: Family Medicine

## 2018-06-23 DIAGNOSIS — M48061 Spinal stenosis, lumbar region without neurogenic claudication: Secondary | ICD-10-CM | POA: Diagnosis not present

## 2018-06-23 DIAGNOSIS — M47816 Spondylosis without myelopathy or radiculopathy, lumbar region: Secondary | ICD-10-CM | POA: Diagnosis not present

## 2018-06-23 DIAGNOSIS — M545 Low back pain, unspecified: Secondary | ICD-10-CM

## 2018-06-23 DIAGNOSIS — G8929 Other chronic pain: Secondary | ICD-10-CM

## 2018-06-23 DIAGNOSIS — M5126 Other intervertebral disc displacement, lumbar region: Secondary | ICD-10-CM | POA: Diagnosis not present

## 2018-06-23 NOTE — Telephone Encounter (Signed)
LMTRC

## 2018-06-25 MED ORDER — METFORMIN HCL 500 MG PO TABS: 1000.0000 mg | ORAL_TABLET | Freq: Two times a day (BID) | ORAL | 3 refills | Status: DC

## 2018-06-25 NOTE — Telephone Encounter (Signed)
Pt's wife aware and med sent to pharm. He is not currently on that as we took him off of it and put him on Victoza

## 2018-06-29 ENCOUNTER — Other Ambulatory Visit: Payer: Self-pay | Admitting: Family Medicine

## 2018-07-17 ENCOUNTER — Other Ambulatory Visit: Payer: Self-pay | Admitting: Family Medicine

## 2018-08-28 ENCOUNTER — Other Ambulatory Visit: Payer: Self-pay | Admitting: Family Medicine

## 2018-09-03 ENCOUNTER — Other Ambulatory Visit: Payer: Self-pay | Admitting: Family Medicine

## 2018-09-06 NOTE — Telephone Encounter (Signed)
Requesting refill  Tramadol    LOV: 06/10/18  LRF:  06/14/18

## 2018-09-10 ENCOUNTER — Telehealth: Payer: Self-pay | Admitting: Family Medicine

## 2018-09-10 NOTE — Telephone Encounter (Signed)
No he is on max dose oral meds.  Need to get samples next week.

## 2018-09-10 NOTE — Telephone Encounter (Signed)
Pt has no victoza left and cannot afford it. We have no samples left in fridge, he wants to know if there is something that he can do with the current by mouth medicines he has for his sugar until we can get samples in next week.

## 2018-09-15 NOTE — Telephone Encounter (Signed)
Pt aware and I will call when we get samples of Victoza in office.

## 2018-09-20 ENCOUNTER — Telehealth: Payer: Self-pay | Admitting: Family Medicine

## 2018-09-20 MED ORDER — LIRAGLUTIDE 18 MG/3ML ~~LOC~~ SOPN: PEN_INJECTOR | SUBCUTANEOUS | 2 refills | Status: DC

## 2018-09-20 NOTE — Telephone Encounter (Signed)
Medication called/sent to requested pharmacy  

## 2018-09-20 NOTE — Telephone Encounter (Signed)
Patient is calling to get get prescription for victoza sent to [facility]

## 2018-10-12 NOTE — Telephone Encounter (Signed)
Gave a sample of Saxenda

## 2018-10-21 ENCOUNTER — Other Ambulatory Visit: Payer: Self-pay | Admitting: Family Medicine

## 2018-10-23 ENCOUNTER — Other Ambulatory Visit: Payer: Self-pay | Admitting: Family Medicine

## 2018-11-09 ENCOUNTER — Telehealth: Payer: Self-pay | Admitting: Family Medicine

## 2018-11-09 NOTE — Telephone Encounter (Signed)
Pt dropped off handicap placcard form to be filled out placed directly into pickard green folder.

## 2018-11-11 ENCOUNTER — Other Ambulatory Visit: Payer: Self-pay | Admitting: Family Medicine

## 2018-11-11 NOTE — Telephone Encounter (Signed)
Pt's wife aware via vm to pick up and form left up front

## 2018-11-12 NOTE — Telephone Encounter (Signed)
Requesting refill Tramadol      LOV: 06/10/18  LRF:  09/06/18

## 2018-11-27 ENCOUNTER — Other Ambulatory Visit: Payer: Self-pay | Admitting: Family Medicine

## 2018-12-09 ENCOUNTER — Other Ambulatory Visit: Payer: Self-pay | Admitting: Family Medicine

## 2018-12-27 ENCOUNTER — Telehealth: Payer: Self-pay | Admitting: Family Medicine

## 2018-12-27 NOTE — Telephone Encounter (Signed)
Try zanaflex 4 mg poq 12 hrs prn back pain

## 2018-12-27 NOTE — Telephone Encounter (Signed)
Patients wife calling to say that his back pain is getting worse wants to know if there are any more options for him available  [phone]

## 2018-12-28 ENCOUNTER — Other Ambulatory Visit: Payer: Self-pay | Admitting: Family Medicine

## 2018-12-29 ENCOUNTER — Other Ambulatory Visit: Payer: Self-pay | Admitting: *Deleted

## 2018-12-29 MED ORDER — TIZANIDINE HCL 4 MG PO TABS: 4.0000 mg | ORAL_TABLET | Freq: Two times a day (BID) | ORAL | 0 refills | Status: DC | PRN

## 2018-12-29 MED ORDER — TIZANIDINE HCL 4 MG PO CAPS: 4.0000 mg | ORAL_CAPSULE | Freq: Two times a day (BID) | ORAL | 2 refills | Status: DC

## 2018-12-29 NOTE — Telephone Encounter (Signed)
Received fax requesting alternative to Zanaflex. Capsules are not covered, but tablets are.   Ok to refill as tablets?

## 2018-12-29 NOTE — Telephone Encounter (Signed)
Pt's wife aware and med sent to pharm 

## 2019-01-05 ENCOUNTER — Encounter: Payer: Self-pay | Admitting: Family Medicine

## 2019-01-05 NOTE — Telephone Encounter (Signed)
This encounter was created in error - please disregard.

## 2019-01-10 ENCOUNTER — Telehealth: Payer: Self-pay | Admitting: Family Medicine

## 2019-01-10 MED ORDER — LIRAGLUTIDE 18 MG/3ML ~~LOC~~ SOPN: PEN_INJECTOR | SUBCUTANEOUS | 2 refills | Status: DC

## 2019-01-10 NOTE — Telephone Encounter (Signed)
Medication called/sent to requested pharmacy  

## 2019-01-10 NOTE — Telephone Encounter (Signed)
Refill on victoza to pharmacy [facility].

## 2019-01-15 ENCOUNTER — Other Ambulatory Visit: Payer: Self-pay | Admitting: Family Medicine

## 2019-01-19 ENCOUNTER — Other Ambulatory Visit: Payer: Self-pay | Admitting: Family Medicine

## 2019-01-19 NOTE — Telephone Encounter (Signed)
Requesting refill      LOV: 06/10/18  LRF:  11/12/18

## 2019-01-27 ENCOUNTER — Other Ambulatory Visit: Payer: Self-pay | Admitting: Family Medicine

## 2019-02-07 ENCOUNTER — Other Ambulatory Visit: Payer: Self-pay | Admitting: Family Medicine

## 2019-02-23 ENCOUNTER — Ambulatory Visit (INDEPENDENT_AMBULATORY_CARE_PROVIDER_SITE_OTHER): Payer: Medicare HMO | Admitting: Family Medicine

## 2019-02-23 ENCOUNTER — Encounter: Payer: Self-pay | Admitting: Family Medicine

## 2019-02-23 ENCOUNTER — Other Ambulatory Visit: Payer: Self-pay

## 2019-02-23 DIAGNOSIS — R531 Weakness: Secondary | ICD-10-CM | POA: Diagnosis not present

## 2019-02-23 MED ORDER — GLUCOSE BLOOD VI STRP: ORAL_STRIP | 5 refills | Status: DC

## 2019-02-23 MED ORDER — INSULIN PEN NEEDLE 31G X 5 MM MISC: 3 refills | Status: DC

## 2019-02-23 NOTE — Progress Notes (Signed)
Patient ID: Ryan Gilmore, male    DOB: November 23, 1932, 85 y.o.   MRN: [id number]  PCP: [name], MD  Virtual Visit via telephone Note  Phone visit arranged with [name] for 02/23/19 at 12:15 PM EDT  Services provided today were via telemedicine through telephone call. Start of phone call:  1:15 PM  I verified that I was speaking with the correct person using two identifiers. Patient reported their location during encounter was at home   Patient consented to telephone visit  I conducted telephone visit from [facility]  Referring Provider:   [name], MD   All participants in encounter:  Myself and the patients daughter Davis Gourd  I discussed the limitations, risks, security and privacy concerns of performing an evaluation and management service by telephone and the availability of in person appointments. I also discussed with the patient that there may be a patient responsible charge related to this service. The patient expressed understanding and agreed to proceed.  No chief complaint on file.   Subjective:   Ryan Gilmore is a 85 y.o. male, with back pain? Sleepiness, decreased appetite.    Pts daughter answered the phone, she stated that she just left a message for the nurse because something "wasn't right" with her dad/the pt.  She said he'd been asleep all morning, he didn't want to be bothered or checked out, he wasn't eating or drinking much today, sleeping more, did complain of his back hurting but when on the phone the pt said he didn't have any back pain.  Davis Gourd said they were trying to check his blood sugar but the glucometer was dead.  I asked if anyone was getting batteries to be able to check his CBG?  She said yes.  I asked if they wanted to do the phone appointment to discuss her dads symptoms, and she said he wanted to sleep and would do it later or would come info the office another day.  I asked if they could get  batteries to check his CBG, asked if they had a BP cuff to see if over the phone we could do some VS.  Kam agreed to talk later this afternoon and she'll get glucometer and BP cuff, will do video call so I can see pt and see if we need to get him in the office to be checked.  With his age and multiple sx, I explained that he may need in person assessement/exam and labs and/or UA.    Also advised Davis Gourd to call 911 or take pt to the ER if altered, lethargic, unresponsive, not drinking or urinating or appears distressed at all.  She verbalized understanding of ER/911 precautions. Call and documentation concluded 1:27 PM Arranged to call back at 3:30  One week decreased appetite, sleeping more, and back pain so severe that he can barely walk to the bathroom, decreased urine output  Cannot take blood sugar, taking all meds still including glimepiride even though appetite decreased and not eating much.  Daughter is helping to check HR and respirations.  96 HR and RR 24 Back pain x 1 week, has hx chronic back pain, pt denies any pain right now, famiy says      Patient Active Problem List   Diagnosis Date Noted  . Chronic midline low back pain without sciatica 10/12/2017  . Type II or unspecified type diabetes mellitus without mention of complication, uncontrolled 06/28/2013  . Other and  unspecified hyperlipidemia 06/28/2013    Prior to Admission medications   Medication Sig Start Date End Date Taking? Authorizing Provider  donepezil (ARICEPT) 5 MG tablet TAKE 1 TABLET BY MOUTH AT BEDTIME 12/09/18   [name], MD  glimepiride (AMARYL) 4 MG tablet Take 1 tablet by mouth once daily 01/17/19   [name], MD  glucose blood (ACCU-CHEK SMARTVIEW) test strip Use as instructed to check blood sugar 2 times per day dx code E11.65 01/30/15   Elayne Snare, MD  Insulin Pen Needle (B-D UF III MINI PEN NEEDLES) 31G X 5 MM MISC USE WITH VICTOZA 02/07/19   [name], MD  liraglutide (VICTOZA) 18  MG/3ML SOPN Inject 0.2 mLs (1.2 mg total) into the skin daily. 04/27/18   [name], MD  liraglutide (VICTOZA) 18 MG/3ML SOPN INJECT 0.2 MLS(1.2MG ) SUBCUTANEOUSLY ONCE DAILY 01/10/19   [name], MD  lisinopril (PRINIVIL,ZESTRIL) 10 MG tablet TAKE 1 TABLET BY MOUTH ONCE DAILY 04/06/18   [name], MD  meloxicam (MOBIC) 15 MG tablet TAKE 1 TABLET BY MOUTH  DAILY 01/28/18   [name], MD  metFORMIN (GLUCOPHAGE) 500 MG tablet TAKE 2 TABLETS BY MOUTH TWICE DAILY WITH A MEAL 01/27/19   [name], MD  pioglitazone (ACTOS) 30 MG tablet TAKE 1 TABLET BY MOUTH ONCE DAILY 10/05/17   [name], MD  pravastatin (PRAVACHOL) 40 MG tablet TAKE 1 TABLET BY MOUTH ONCE DAILY 08/30/18   [name], MD  tiZANidine (ZANAFLEX) 4 MG tablet Take 1 tablet (4 mg total) by mouth every 12 (twelve) hours as needed for muscle spasms. 12/29/18   [name], MD  traMADol (ULTRAM) 50 MG tablet TAKE 1 TABLET BY MOUTH EVERY 8 HOURS AS NEEDED 01/20/19   [name], MD    No Known Allergies  Review of Systems     Objective:    There were no vitals filed for this visit.    Physical Exam       Assessment & Plan:     ICD-10-CM   1. Weakness R53.1   pt to come into clinic needs in person assessment, blood sugar checked, vitals, UA?    I discussed the assessment and treatment plan with the patient. The patient was provided an opportunity to ask questions and all were answered. The patient agreed with the plan and demonstrated an understanding of the instructions.   The patient was advised to call back or seek an in-person evaluation if the symptoms worsen or if the condition fails to improve as anticipated.  Phone call concluded at 4:30pm  I provided 30 minutes of non-face-to-face time during this encounter. Between both calls  Delsa Grana, PA-C 02/23/19 1:15 PM

## 2019-02-24 ENCOUNTER — Other Ambulatory Visit: Payer: Self-pay

## 2019-02-24 ENCOUNTER — Ambulatory Visit (INDEPENDENT_AMBULATORY_CARE_PROVIDER_SITE_OTHER): Payer: Medicare HMO | Admitting: Family Medicine

## 2019-02-24 ENCOUNTER — Encounter: Payer: Self-pay | Admitting: Family Medicine

## 2019-02-24 VITALS — BP 110/80 | HR 98 | Temp 98.6°F | Resp 14 | Ht 70.5 in | Wt 172.0 lb

## 2019-02-24 DIAGNOSIS — R634 Abnormal weight loss: Secondary | ICD-10-CM

## 2019-02-24 DIAGNOSIS — M545 Low back pain: Secondary | ICD-10-CM | POA: Diagnosis not present

## 2019-02-24 DIAGNOSIS — G8929 Other chronic pain: Secondary | ICD-10-CM

## 2019-02-24 DIAGNOSIS — IMO0002 Reserved for concepts with insufficient information to code with codable children: Secondary | ICD-10-CM

## 2019-02-24 DIAGNOSIS — E1165 Type 2 diabetes mellitus with hyperglycemia: Secondary | ICD-10-CM | POA: Diagnosis not present

## 2019-02-24 DIAGNOSIS — R531 Weakness: Secondary | ICD-10-CM | POA: Diagnosis not present

## 2019-02-24 DIAGNOSIS — E118 Type 2 diabetes mellitus with unspecified complications: Secondary | ICD-10-CM

## 2019-02-24 MED ORDER — GLUCOSE BLOOD VI STRP: ORAL_STRIP | 5 refills | Status: DC

## 2019-02-24 MED ORDER — HYDROCODONE-ACETAMINOPHEN 7.5-325 MG PO TABS: 1.0000 | ORAL_TABLET | Freq: Four times a day (QID) | ORAL | 0 refills | Status: DC | PRN

## 2019-02-24 NOTE — Addendum Note (Signed)
Addended by: Jenna Luo T on: 02/24/2019 10:23 AM   Modules accepted: Orders

## 2019-02-24 NOTE — Progress Notes (Signed)
Subjective:    Patient ID: Ryan Gilmore, male    DOB: Dec 17, 1932, 85 y.o.   MRN: [id number]  Back Pain     10/22/17 Patient presents today with his wife to discuss his chronic low back pain.  Patient saw his orthopedist approximately 10 days ago and I have recommended bilateral L4-L5 facet joint injections as well as L5-S1 facet joint injections due to his degenerative disc disease to see if that will help with his low back pain.  [name] treatment is complicated by his underlying dementia as well.  He continues to endorse low back pain.  His wife states that he is in misery.  He primarily sits in a chair unwilling to stand or move due to the pain.  He points to an area just lateral to his lumbar spine to the right side right above the SI joint.  There is no tenderness to palpation in that area.  He does report decreased range of motion.  He denies any sciatica or leg weakness.  He is tried and failed physical therapy.  I did give him a prescription for tramadol 50 mg tablets to take every 8 hours as needed.  Wife has been given it to him every 8 hours and states that it helped some occasionally and at other times provided no relief.  At that time, the plan was:  I explained to the patient and his wife that I was concerned about increasing the strength of his pain medication possibly exacerbating his dementia and also increasing his fall risk.  I explained to his wife that she could use the tramadol every 6 hours as needed.  If he is having severe pain, she did could increase to 100 mg sparingly.  I cautioned her against regular use unless the pain is moderate to severe due to the increased risk of falls and confusion area he has a cortisone injection scheduled for 10 days from now.  Hopefully that will provide him relief as well  10/29/17 Patient recently went to the emergency room complaining of poor appetite and weight loss. Urinalysis showed pyuria. He was started on Keflex for possible urinary  tract infection. Urine culture returned recently showing Enterococcus faecalis. However he was directed to stop his antibiotic by the emergency room today have not taken any further doses of his antibiotic. His wife states that he is now extremely nauseated. He is losing weight. He is not eating. She denies any fevers or chills. He continues to complain of low back pain. He denies any dysuria or hematuria or urgency or frequency. However he is unable to provide an accurate history due to dementia. He does report no appetite, early satiety, weakness, and nausea.  Recently began ozempic at [place].  At that time, my plan was: I am concerned about dehydration. He has lost 4 pounds since I last saw him. I want him to discontinue lisinopril. I also want him to hold Amaryl, metformin because he is not taking adequate by mouth nutrition and I'm concerned about hypoglycemia. Also want him to temporarily discontinue ozempic as this could be affecting his appetite and making him nauseated. Meanwhile start Zofran 4 mg by mouth every 8 hours when necessary nausea and go for an x-ray of the abdomen to rule out bowel obstruction or ileus or fecal impaction. I also believe that some of his nausea and weakness could be due to undiagnosed prostatitis. In an 85 year old man, a simple uncomplicated UTI is very unlikely. For him to have  silent symptoms, I would be concerned that this could be from prostatitis. Therefore I recommended Cipro 500 mg by mouth twice a day for 10 days given the culture confirmed urinary tract infection. Recheck on Monday  11/02/17 The x-ray confirmed a moderate amount of constipation and stool throughout the colon. Family was recommended to use MiraLAX as it was thought that the constipation could be contributing to his poor intake. MiraLAX was ineffective. They did give him 1 dose of linzess which seemed to work better.  [name] weight is up 4 pounds today compared to his last visit. His color looks better.  He has more energy. He is smiling. He denies any abdominal pain or nausea. However his history is limited by his dementia. His wife states that he still not eating very well and he continues to complain of low back pain.  At that time, my plan was: I believe the patient has turned the corner and starting to show signs of improvement. I recommended staying on linzess 145 mcg poqday for constipation unless he develops diarrhea. Continue to refrain from using ozempic as I believe this could be causing his nausea and poor appetite. He truly is starting to appear better. His next dose of the medicine was due on Saturday so really he's only been off the medication for one or 2 days. I would like to see if he continues to improve over the next week off the medication particularly as his prostatitis improves and his constipation improves. Recheck on Monday of next week.  11/09/17 Wt Readings from Last 3 Encounters:  02/24/19 172 lb (78 kg)  06/10/18 186 lb (84.4 kg)  04/23/18 188 lb (85.3 kg)   Clinically, patient seems to be doing much better.  He is gained 7 pounds in the last week.  His appetite is better.  He is eating and drinking more.  His constipation is better on linzess.  He denies any dysuria.  Wife still complains that he is complaining of low back pain.  He is scheduled for epidural steroid injection on February 18 now which may help with this.  She still complains that his energy level and drive is also lower than normal.  However I believe a lot of this could be dementia related.  Patient states that he feels fine and he denies any complaints today.  He is smiling and interactive and appears healthy and well-nourished.  At that time, my plan was:  Clinically the patient looks much better.  His nausea and weight loss has completely resolved.  I believe his poor appetite was likely secondary to ozempic.  I have recommended today discontinue that medication permanently.  I would like him to resume  metformin as his appetite has improved.  I have recommended that his wife monitor his blood sugars.  If his blood sugars rise greater than 200, I would then resume glimepiride.  However I want her to keep him away from the glimepiride at the present time.  Stay on linzess 72 mcg p.o. daily for chronic constipation.  Repeat urinalysis and urine culture to ensure resolution of prostatitis.  Clinically the patient appears to be back to his baseline.  11/26/17 The patient's wife is starting to see elevated blood sugars.  His blood sugar this morning was 220 fasting and shortly after lunch, it was greater than 300.  He denies any polyuria, polydipsia, or blurry vision.  He has been taking Actos.  He is also resumed his metformin however he is still off  glimepiride as well as ozempic as stated in earlier office visits.  AT that time, my plan was: Resume glimepiride and recheck blood sugars in 1 week.  Ultimately I would like to keep his blood sugars between 100-200 is given his age and medical comorbidities.  If not in that range, patient would then benefit from adding [place].  02/16/18 Patient is here today with his wife.  She reports blood sugars between 203 100 fasting in the morning.  She states that she is giving him metformin 500 twice a day despite the fact I stopped the medication earlier this year.  She is also giving him Amaryl and Actos as prescribed.  Sugars typically run between 200-250 most days.  She is also concerned because all he does is sit around and take naps.  He has no drive.  He has no motivation.  She will tell him to cut the grass and he states later and then forgets and never does it.  She tells him to shave, he will tell her later and then forget and never does.  She will ask him to go to town or go shopping with her, he will state later and then simply take a nap.  Due to his dementia, he is unable to provide additional history.  At that time, my plan was: Discontinue metformin and  replace with Janumet XR 50/1000, 2 tablets p.o. every morning.  Recheck blood sugars in 1 month.  Goal blood sugars are between 100-200.  Consider treating the patient for depression.  I believe his apathy is a combination of age, memory loss, and may be some mild underlying depression.  Consider adding low-dose SSRI at next visit to see if this will help  03/16/18 [name] wife states that his sugars have improved on Janumet.  She is still given him 2 tablets of this every morning in addition to Actos and glipizide.  Blood sugars are under 200 when she checks them.  She denies any polyuria, polydipsia, or blurry vision.  However she states that the patient is constantly complaining about pain in his lower back.  Pain is located around the level of L5 and is to the right of midline.  There is no palpable nodule or mass in that area.  He denies any numbness or tingling in his legs.  He denies any weakness in his legs.  He denies any bowel or bladder incontinence.  Work-up has included a referral to an orthopedist, as well as an x-ray of the lumbar spine his results are dictated below: 2 views of the lumbar spine show no acute findings in terms of fractures  or malalignment. There is degenerative changes but is only mild to  moderate at several levels but disc heights are decently maintained and  alignment is decently maintained.  At that time, my plan was: I have recommended scheduled dosing of his pain medication to try to better manage his pain then just chasing the pain medication with the occasional Tylenol.  Therefore I recommended tramadol 50 mg every morning at breakfast, Tylenol ES every day at lunch, tramadol 50 mg at supper, and another extra strength Tylenol at bedtime.  I hope that with scheduled dosing and spreading the medication out throughout the day that we will achieve better pain control with minimal risk of side effects and that this will facilitate the patient's ability to get up and  be more active and enjoy life more.  Recheck in 2 weeks.  If pain is not improving,  pain with then be out of proportion to exam, and we may want to consider more in-depth imaging to rule out underlying bone pathology in that area  04/23/18 [name] hemoglobin A1c was 8.9 and his last visit on June 11.  His random sugar was over 300.  Therefore I recommended that we add Victoza 1.2 mg subcu daily to his regimen and recheck his blood sugars in 1 month.  They are here today to reassess.  Unfortunately the patient's dementia and the patient's wife's dementia make it extremely difficult.  I began our conversation by asking how his sugars are doing.  Wife states that they are out of control.  She states that his sugars are in the 200s.  However upon further questioning, the patient's wife states that she is not actually checking his sugars that he is checking his sugars himself and reporting them to her.  When asked to see his glucometer and review the memory, there is only 3 sugars since I last saw the patient.  The last dated blood sugar was on June 17.  He had not started Vick toes until 2 days prior to that.  There have been no blood sugars fasting are otherwise for the last month.  Therefore it is impossible to determine how well his sugars are being managed on the Victoza.  [name] wife also states that he is in constant pain in his back.  Please see above the discussion of this.  However I question her as to how she is getting his pain medication.  She forgot that he had pain medication and is only giving it when he requests it.  She is not doing the scheduled strategy that we had discussed at his last visit due to the fact that she forgot our discussion.  She too is suffering from dementia.  At that time, my plan was: I have asked the patient's wife to begin checking his sugars for him and recording the fasting and 2-hour postprandial sugars for 1 week and then contact me in 1 week so we can determine how  well the toes is managing his sugars.  In general his sugars are between 102 100 I am satisfied.  Regarding his low back pain, I have recommended that she perform the scheduled pain medication that we had discussed, as tramadol in the morning, Tylenol at lunch, tramadol at dinnertime, and another Tylenol in the evening.  Meanwhile I will schedule the patient for an MRI given the fact his back pain has been present now for almost a year, it is not improving despite conservative measures  06/10/18 Originally insurance declined his MRI however we were able to get it approved.  MRI was scheduled for July 30.  Apparently the patient did not keep the appointment and never went for the MRI.  He is here today with his wife.  She is stating that he is in constant pain.  When I examined the patient however he is smiling.  He is laughing as I discussed his pain with him.  He is able to get off the exam table and walk up and down the hallway with no obvious signs of pain.  There is mild tenderness to palpation in the lumbar paraspinal muscles around the level of L4 however it is very mild.  When I question the patient he states is a nagging pain.  He states that is not severe.  His dementia makes history difficult however I presented him with a scale of 1-10 and  gave examples of what would constitute a 1 and what would constitute a 10.  He states that his pain is closer to a 1.  Muscle strength is 5/5 equal and symmetric in the upper and lower extremities.  Reflexes are normal.  There is no evidence of cauda equina syndrome.  He is denying any fevers or chills.  When I asked his wife why she feels that he is in constant pain, she states that whenever she asked him to do anything, he states no my back hurts.  It seems like this may be a easy way for him to avoid doing or going places that he does not want to go.  I am concerned that his dementia may cause him to state this even when he is not in severe pain.  I am also  concerned about the wife's dementia as she obviously forgot the MRI and also does not recall having this discussion on several occasions. At that time, my plan was: [name] exam today is completely benign.  In fact he smiles when I questioned him about his back pain and laughs at times when he describes his pain.  It seems like the pain is very minimal and certainly is not causing him terrible discomfort.  I do believe he has a constant low-grade nagging pain in his back likely due to his arthritic changes.  However I believe that he uses this is a "easy excuse" when he does not want to do things that his wife asked him to do primarily because of his dementia and his inability to express himself.  In any regards, he does not seem to be in any pain today and certainly not to the point that I would prescribe him opiates or recommend surgery.  I will reschedule his MRI that he previously did not keep his appointment for.  I will also obtain an x-ray to rule out acute findings that would explain constant low back pain such as bony cancers or fractures.  I try to reassure his wife that we may have to live with a low-grade level of pain that he is experiencing.  02/24/19 MRI in September revealed: IMPRESSION: 1. L3-4 and L4-5 disc degeneration with lower lumbar facet arthropathy greatest at L5-S1. No acute or inflammatory finding. 2. Noncompressive degenerative foraminal narrowing at L4-5 and L5-S1. The canal is diffusely patent.  There was no evidence of any need for surgery based on his MRI results.  He is here today with his wife.  She reports constant low back pain.  Patient points to the center of his back roughly at the level of L4.  However he minimizes the pain.  He states that is just sore there all the time.  Wife states that he wakes up in the morning.  He sits in a chair and watches TV all throughout the day.  He is constantly sleeping.  Whenever she asked him to do anything he complains that his  back hurting.  She has to help pull him to his feet to get out of the chair and he complains of back pain.  Whenever he stands and walks she states that he complains of back pain.  Over the last few months his appetite is also decreased and his weight has dropped from 186 in September to 172 today.  His wife is not checking his blood sugar.  The last sugars they recall were 150-160 however they have not been checking it recently.  She is concerned because he  is becoming progressively more fatigued.  He spends most of the day sleeping.  This is been gradually occurring over the last several months but getting steadily worse.  His dementia also seems to be getting worse.  He does not answer any questions today but defers to his wife.  When I do ask him a question.  He says very little.  He does not appear to be in any acute distress.  He is smiling.  He laughs at times.  He denies any other significant problems.  He is alert and awake and does not appear lethargic Past Medical History:  Diagnosis Date   Bleeding duodenal ulcer    ?  1980s   Dementia ([place])    Diabetes mellitus without complication ([place])    Hyperlipidemia    Past Surgical History:  Procedure Laterality Date   APPENDECTOMY     Current Outpatient Medications on File Prior to Visit  Medication Sig Dispense Refill   donepezil (ARICEPT) 5 MG tablet TAKE 1 TABLET BY MOUTH AT BEDTIME 90 tablet 0   glimepiride (AMARYL) 4 MG tablet Take 1 tablet by mouth once daily 90 tablet 0   Insulin Pen Needle (B-D UF III MINI PEN NEEDLES) 31G X 5 MM MISC USE WITH VICTOZA 100 each 3   liraglutide (VICTOZA) 18 MG/3ML SOPN Inject 0.2 mLs (1.2 mg total) into the skin daily. 2 pen 2   lisinopril (PRINIVIL,ZESTRIL) 10 MG tablet TAKE 1 TABLET BY MOUTH ONCE DAILY 90 tablet 3   meloxicam (MOBIC) 15 MG tablet TAKE 1 TABLET BY MOUTH  DAILY 90 tablet 3   metFORMIN (GLUCOPHAGE) 500 MG tablet TAKE 2 TABLETS BY MOUTH TWICE DAILY WITH A MEAL 120 tablet 0     pioglitazone (ACTOS) 30 MG tablet TAKE 1 TABLET BY MOUTH ONCE DAILY 90 tablet 1   pravastatin (PRAVACHOL) 40 MG tablet TAKE 1 TABLET BY MOUTH ONCE DAILY 90 tablet 2   tiZANidine (ZANAFLEX) 4 MG tablet Take 1 tablet (4 mg total) by mouth every 12 (twelve) hours as needed for muscle spasms. 60 tablet 0   traMADol (ULTRAM) 50 MG tablet TAKE 1 TABLET BY MOUTH EVERY 8 HOURS AS NEEDED 60 tablet 0   No current facility-administered medications on file prior to visit.    No Known Allergies Social History   Socioeconomic History   Marital status: Married    Spouse name: Not on file   Number of children: Not on file   Years of education: Not on file   Highest education level: Not on file  Occupational History   Not on file  Social Needs   Financial resource strain: Not on file   Food insecurity:    Worry: Not on file    Inability: Not on file   Transportation needs:    Medical: Not on file    Non-medical: Not on file  Tobacco Use   Smoking status: Never Smoker   Smokeless tobacco: Current User    Types: Chew  Substance and Sexual Activity   Alcohol use: Not on file   Drug use: Not on file   Sexual activity: Not on file  Lifestyle   Physical activity:    Days per week: Not on file    Minutes per session: Not on file   Stress: Not on file  Relationships   Social connections:    Talks on phone: Not on file    Gets together: Not on file    Attends religious service: Not on file  Active member of club or organization: Not on file    Attends meetings of clubs or organizations: Not on file    Relationship status: Not on file   Intimate partner violence:    Fear of current or ex partner: Not on file    Emotionally abused: Not on file    Physically abused: Not on file    Forced sexual activity: Not on file  Other Topics Concern   Not on file  Social History Narrative   Not on file      Review of Systems  Musculoskeletal: Positive for back pain.   All other systems reviewed and are negative.      Objective:   Physical Exam  Cardiovascular: Normal rate, regular rhythm and normal heart sounds.  Pulmonary/Chest: Effort normal and breath sounds normal. No respiratory distress. He has no wheezes. He has no rales.  Musculoskeletal:     Lumbar back: He exhibits decreased range of motion. He exhibits no tenderness, no bony tenderness, no swelling, no edema, no deformity, no pain and no spasm.  Vitals reviewed.         Assessment & Plan:   Uncontrolled type 2 diabetes mellitus with complication, without long-term current use of insulin (HCC) - Plan: CBC with Differential/Platelet, COMPLETE METABOLIC PANEL WITH GFR, Hemoglobin A1c, TSH  Chronic midline low back pain without sciatica  Weight loss  Weakness  Patient does have low back pain however I believe that what he experiences is likely not as severe as his wife interprets it to be.  I believe most of the time when he sitting or resting he is experiencing no pain at all.  I do believe he experiences pain and stiffness when he tries to stand and walk and this is when he complains about his back hurting.  I believe this is most likely due to degenerative disc disease in his lumbar spine as evidenced on the MRI.  I also believe he has muscle stiffness related to this.  I believe some of his sedation is likely secondary to the tramadol and the Zanaflex.  Therefore I recommended that they stop this.  Instead we will try hydrocodone/acetaminophen 7.5/325 1 p.o. 3 times daily.  We will see if this helps his back pain.  I did caution the patient and his wife about possible constipation and confusion but I hope that we can better manage his pain to improve his range of motion and quality of life.  Given his weight loss I will check a CBC, CMP, hemoglobin A1c, and TSH.  I suspect the patient may be overmedicated because they are not checking his sugars.  We may be able to discontinue Victoza which  could be suppressing his appetite given the 14 pound weight loss he is experienced in September.  I will also check his thyroid to ensure that he is not developed hyperthyroidism.  He does appear slightly dehydrated today so having tried to encourage the patient to drink more water.  I believe a lot of his symptoms are due to his worsening dementia including his decline in energy, his declining appetite, his apathy, and his increasing need/desire to sleep.

## 2019-02-25 ENCOUNTER — Other Ambulatory Visit: Payer: Medicare HMO

## 2019-02-25 ENCOUNTER — Other Ambulatory Visit: Payer: Self-pay | Admitting: Family Medicine

## 2019-02-25 DIAGNOSIS — R944 Abnormal results of kidney function studies: Secondary | ICD-10-CM | POA: Diagnosis not present

## 2019-02-25 DIAGNOSIS — N3000 Acute cystitis without hematuria: Secondary | ICD-10-CM

## 2019-02-25 LAB — URINALYSIS, ROUTINE W REFLEX MICROSCOPIC
Bilirubin Urine: NEGATIVE
Hgb urine dipstick: NEGATIVE
Hyaline Cast: NONE SEEN /LPF
Nitrite: NEGATIVE
Protein, ur: NEGATIVE
RBC / HPF: NONE SEEN /HPF (ref 0–2)
Specific Gravity, Urine: 1.02 (ref 1.001–1.03)
Squamous Epithelial / HPF: NONE SEEN /HPF (ref <=5)
pH: 5 (ref 5.0–8.0)

## 2019-02-25 LAB — CBC WITH DIFFERENTIAL/PLATELET
Absolute Monocytes: 622 cells/uL (ref 200–950)
Basophils Absolute: 67 cells/uL (ref 0–200)
Basophils Relative: 0.6 %
Eosinophils Absolute: 311 cells/uL (ref 15–500)
Eosinophils Relative: 2.8 %
HCT: 45.9 % (ref 38.5–50.0)
Hemoglobin: 15.5 g/dL (ref 13.2–17.1)
Lymphs Abs: 4507 cells/uL — ABNORMAL HIGH (ref 850–3900)
MCH: 30.5 pg (ref 27.0–33.0)
MCHC: 33.8 g/dL (ref 32.0–36.0)
MCV: 90.2 fL (ref 80.0–100.0)
MPV: 11.4 fL (ref 7.5–12.5)
Monocytes Relative: 5.6 %
Neutro Abs: 5594 cells/uL (ref 1500–7800)
Neutrophils Relative %: 50.4 %
Platelets: 200 10*3/uL (ref 140–400)
RBC: 5.09 10*6/uL (ref 4.20–5.80)
RDW: 13 % (ref 11.0–15.0)
Total Lymphocyte: 40.6 %
WBC: 11.1 10*3/uL — ABNORMAL HIGH (ref 3.8–10.8)

## 2019-02-25 LAB — MICROSCOPIC MESSAGE

## 2019-02-25 LAB — COMPLETE METABOLIC PANEL WITH GFR
AG Ratio: 1.8 (calc) (ref 1.0–2.5)
ALT: 12 U/L (ref 9–46)
AST: 18 U/L (ref 10–35)
Albumin: 4.7 g/dL (ref 3.6–5.1)
Alkaline phosphatase (APISO): 49 U/L (ref 35–144)
BUN/Creatinine Ratio: 16 (calc) (ref 6–22)
BUN: 25 mg/dL (ref 7–25)
CO2: 21 mmol/L (ref 20–32)
Calcium: 10 mg/dL (ref 8.6–10.3)
Chloride: 101 mmol/L (ref 98–110)
Creat: 1.52 mg/dL — ABNORMAL HIGH (ref 0.70–1.11)
GFR, Est African American: 48 mL/min/{1.73_m2} — ABNORMAL LOW (ref >=60)
GFR, Est Non African American: 42 mL/min/{1.73_m2} — ABNORMAL LOW (ref >=60)
Globulin: 2.6 g/dL (calc) (ref 1.9–3.7)
Glucose, Bld: 198 mg/dL — ABNORMAL HIGH (ref 65–99)
Potassium: 4.8 mmol/L (ref 3.5–5.3)
Sodium: 139 mmol/L (ref 135–146)
Total Bilirubin: 0.7 mg/dL (ref 0.2–1.2)
Total Protein: 7.3 g/dL (ref 6.1–8.1)

## 2019-02-25 LAB — TSH: TSH: 1.61 mIU/L (ref 0.40–4.50)

## 2019-02-25 LAB — HEMOGLOBIN A1C
Hgb A1c MFr Bld: 8 % of total Hgb — ABNORMAL HIGH (ref <5.7)
Mean Plasma Glucose: 183 (calc)
eAG (mmol/L): 10.1 (calc)

## 2019-02-25 MED ORDER — CIPROFLOXACIN HCL 250 MG PO TABS: 250.0000 mg | ORAL_TABLET | Freq: Two times a day (BID) | ORAL | 0 refills | Status: AC

## 2019-02-26 LAB — URINE CULTURE
MICRO NUMBER:: 500198
SPECIMEN QUALITY:: ADEQUATE

## 2019-02-28 ENCOUNTER — Other Ambulatory Visit: Payer: Self-pay | Admitting: Family Medicine

## 2019-03-08 ENCOUNTER — Telehealth: Payer: Self-pay | Admitting: Family Medicine

## 2019-03-08 NOTE — Telephone Encounter (Signed)
[name] daughter walked into clinic around 4 pm and stated that patient was home and appeared ill. She stated that he had only eaten oatmeal on today and vomited it up and has had little to nothing to drink, and had been laying around with eyes closed the majority of the day. I asked if she could call her mom whom is at home with patient to ask if she can check his CBG and they were unable to check CBG. Advised patient that due to patient uncontrolled diabetes, c/o generalized illness, and lack of fluid intake recommend that patient go to the ER for IV fluids and evaluation and follow up with [place] for a ER/Hospital follow up. [name] daughter verbalized understanding

## 2019-03-10 ENCOUNTER — Emergency Department (HOSPITAL_COMMUNITY): Payer: Medicare HMO

## 2019-03-10 ENCOUNTER — Other Ambulatory Visit: Payer: Self-pay

## 2019-03-10 ENCOUNTER — Encounter (HOSPITAL_COMMUNITY): Payer: Self-pay

## 2019-03-10 ENCOUNTER — Emergency Department (HOSPITAL_COMMUNITY)
Admission: EM | Admit: 2019-03-10 | Discharge: 2019-03-10 | Disposition: A | Discharge status: Home / Self Care | Payer: Medicare HMO | Attending: Emergency Medicine | Admitting: Emergency Medicine

## 2019-03-10 DIAGNOSIS — F0390 Unspecified dementia without behavioral disturbance: Secondary | ICD-10-CM | POA: Insufficient documentation

## 2019-03-10 DIAGNOSIS — E1165 Type 2 diabetes mellitus with hyperglycemia: Secondary | ICD-10-CM | POA: Diagnosis not present

## 2019-03-10 DIAGNOSIS — R739 Hyperglycemia, unspecified: Secondary | ICD-10-CM

## 2019-03-10 DIAGNOSIS — Z79899 Other long term (current) drug therapy: Secondary | ICD-10-CM | POA: Insufficient documentation

## 2019-03-10 DIAGNOSIS — R638 Other symptoms and signs concerning food and fluid intake: Secondary | ICD-10-CM | POA: Insufficient documentation

## 2019-03-10 DIAGNOSIS — F17220 Nicotine dependence, chewing tobacco, uncomplicated: Secondary | ICD-10-CM | POA: Insufficient documentation

## 2019-03-10 DIAGNOSIS — Z7984 Long term (current) use of oral hypoglycemic drugs: Secondary | ICD-10-CM | POA: Diagnosis not present

## 2019-03-10 DIAGNOSIS — R1110 Vomiting, unspecified: Secondary | ICD-10-CM | POA: Diagnosis not present

## 2019-03-10 LAB — COMPREHENSIVE METABOLIC PANEL
ALT: 12 U/L (ref 0–44)
AST: 13 U/L — ABNORMAL LOW (ref 15–41)
Albumin: 4.3 g/dL (ref 3.5–5.0)
Alkaline Phosphatase: 48 U/L (ref 38–126)
Anion gap: 9 (ref 5–15)
BUN: 13 mg/dL (ref 8–23)
CO2: 24 mmol/L (ref 22–32)
Calcium: 9.2 mg/dL (ref 8.9–10.3)
Chloride: 103 mmol/L (ref 98–111)
Creatinine, Ser: 1.03 mg/dL (ref 0.61–1.24)
GFR calc Af Amer: >60 mL/min (ref >60)
GFR calc non Af Amer: >60 mL/min (ref >60)
Glucose, Bld: 318 mg/dL — ABNORMAL HIGH (ref 70–99)
Potassium: 4 mmol/L (ref 3.5–5.1)
Sodium: 136 mmol/L (ref 135–145)
Total Bilirubin: 0.9 mg/dL (ref 0.3–1.2)
Total Protein: 7.1 g/dL (ref 6.5–8.1)

## 2019-03-10 LAB — URINALYSIS, ROUTINE W REFLEX MICROSCOPIC
Bacteria, UA: NONE SEEN
Bilirubin Urine: NEGATIVE
Glucose, UA: >=500 mg/dL — AB
Hgb urine dipstick: NEGATIVE
Ketones, ur: 5 mg/dL — AB
Leukocytes,Ua: NEGATIVE
Nitrite: NEGATIVE
Protein, ur: NEGATIVE mg/dL
Specific Gravity, Urine: 1.014 (ref 1.005–1.030)
pH: 5 (ref 5.0–8.0)

## 2019-03-10 LAB — CBC WITH DIFFERENTIAL/PLATELET
Abs Immature Granulocytes: 0.02 10*3/uL (ref 0.00–0.07)
Basophils Absolute: 0 10*3/uL (ref 0.0–0.1)
Basophils Relative: 1 %
Eosinophils Absolute: 0.2 10*3/uL (ref 0.0–0.5)
Eosinophils Relative: 3 %
HCT: 42.8 % (ref 39.0–52.0)
Hemoglobin: 14.2 g/dL (ref 13.0–17.0)
Immature Granulocytes: 0 %
Lymphocytes Relative: 37 %
Lymphs Abs: 3.2 10*3/uL (ref 0.7–4.0)
MCH: 30.1 pg (ref 26.0–34.0)
MCHC: 33.2 g/dL (ref 30.0–36.0)
MCV: 90.9 fL (ref 80.0–100.0)
Monocytes Absolute: 0.6 10*3/uL (ref 0.1–1.0)
Monocytes Relative: 7 %
Neutro Abs: 4.6 10*3/uL (ref 1.7–7.7)
Neutrophils Relative %: 52 %
Platelets: 168 10*3/uL (ref 150–400)
RBC: 4.71 MIL/uL (ref 4.22–5.81)
RDW: 13.2 % (ref 11.5–15.5)
WBC: 8.8 10*3/uL (ref 4.0–10.5)
nRBC: 0 % (ref 0.0–0.2)

## 2019-03-10 LAB — CBG MONITORING, ED
Glucose-Capillary: 353 mg/dL — ABNORMAL HIGH (ref 70–99)
Glucose-Capillary: 221 mg/dL — ABNORMAL HIGH (ref 70–99)

## 2019-03-10 MED ORDER — SODIUM CHLORIDE 0.9 % IV BOLUS
500.0000 mL | Freq: Once | INTRAVENOUS | Status: AC
  Administered 2019-03-10: 500 mL via INTRAVENOUS

## 2019-03-10 MED ORDER — SODIUM CHLORIDE 0.9 % IV BOLUS
500.0000 mL | Freq: Once | INTRAVENOUS | Status: AC
  Administered 2019-03-10: 11:00:00 500 mL via INTRAVENOUS

## 2019-03-10 NOTE — ED Notes (Signed)
Pt. Received a diet ginger ale for his fluid challenge. Nurse aware

## 2019-03-10 NOTE — Telephone Encounter (Signed)
[name] daughter called back in on today 12/08/2018 stating that she was unable to get her dad to the hospital on 12/06/2018. States that he still has not been eating and only taking very small sips of drinks. States he is still laying around with eyes closed. I again advised her to take him to the ED so that he may be accessed and given IV fluids for dehydration. [name] daughter verbalized understanding.

## 2019-03-10 NOTE — Discharge Instructions (Addendum)
Encourage fluid intake.

## 2019-03-10 NOTE — ED Provider Notes (Signed)
[facility] DEPT Provider Note   CSN: [id number] Arrival date & time: 03/10/19  0623    History   Chief Complaint Chief Complaint  Patient presents with  . Hyperglycemia    HPI Ryan Gilmore is a 85 y.o. male.    Level 5 caveat due to dementia. HPI Reportedly comes in for decreased oral intake over the last 4 days.  Has had a couple days of vomiting.  Has not been eating as much.  Decreased activity.  Also has had some back pain but this is chronic for the patient.  History comes from wife.  Denies fever.  Reviewing labs done as an outpatient he has had a recent increase in creatinine on lab work done about 2 weeks ago. Past Medical History:  Diagnosis Date  . Bleeding duodenal ulcer    ?  1980s  . Dementia ([place])   . Diabetes mellitus without complication ([place])   . Hyperlipidemia     Patient Active Problem List   Diagnosis Date Noted  . Chronic midline low back pain without sciatica 10/12/2017  . Type II or unspecified type diabetes mellitus without mention of complication, uncontrolled 06/28/2013  . Other and unspecified hyperlipidemia 06/28/2013    Past Surgical History:  Procedure Laterality Date  . APPENDECTOMY          Home Medications    Prior to Admission medications   Medication Sig Start Date End Date Taking? Authorizing Provider  donepezil (ARICEPT) 5 MG tablet TAKE 1 TABLET BY MOUTH AT BEDTIME Patient taking differently: Take 5 mg by mouth at bedtime.  12/09/18  Yes [name], MD  glucose blood (ACCU-CHEK SMARTVIEW) test strip Check blood sugar 2 times per day dx code E11.65 02/24/19  Yes [name], MD  HYDROcodone-acetaminophen (NORCO) 7.5-325 MG tablet Take 1 tablet by mouth every 6 (six) hours as needed for moderate pain. 02/24/19  Yes [name], MD  Insulin Pen Needle (B-D UF III MINI PEN NEEDLES) 31G X 5 MM MISC USE WITH VICTOZA 02/23/19  Yes Delsa Grana, PA-C  liraglutide (VICTOZA) 18 MG/3ML SOPN  Inject 0.2 mLs (1.2 mg total) into the skin daily. 04/27/18  Yes [name], MD  metFORMIN (GLUCOPHAGE) 500 MG tablet TAKE 2 TABLETS BY MOUTH TWICE DAILY WITH A MEAL Patient taking differently: Take 1,000 mg by mouth 2 (two) times daily with a meal.  03/01/19  Yes Pickard, Cammie [name], MD  pioglitazone (ACTOS) 30 MG tablet TAKE 1 TABLET BY MOUTH ONCE DAILY Patient taking differently: Take 30 mg by mouth daily.  10/05/17  Yes [name], MD  pravastatin (PRAVACHOL) 40 MG tablet TAKE 1 TABLET BY MOUTH ONCE DAILY Patient taking differently: Take 40 mg by mouth daily.  08/30/18  Yes [name], MD    Family History Family History  Problem Relation Age of Onset  . Heart disease Father   . Diabetes Brother     Social History Social History   Tobacco Use  . Smoking status: Never Smoker  . Smokeless tobacco: Current User    Types: Chew  Substance Use Topics  . Alcohol use: Not Currently  . Drug use: Never     Allergies   Patient has no known allergies.   Review of Systems Review of Systems  Unable to perform ROS: Dementia     Physical Exam Updated Vital Signs BP 107/81 (BP Location: Right Arm)   Pulse 72   Temp (!) 97.3 F (36.3  C) (Oral)   Resp 20   Ht 5\' 10"  (1.778 m)   Wt 78 kg   SpO2 97%   BMI 24.68 kg/m   Physical Exam Vitals signs reviewed.  HENT:     Head: Atraumatic.     Mouth/Throat:     Mouth: Mucous membranes are moist.  Cardiovascular:     Rate and Rhythm: Normal rate and regular rhythm.  Pulmonary:     Breath sounds: No wheezing, rhonchi or rales.  Abdominal:     Tenderness: There is no abdominal tenderness.  Musculoskeletal:     Right lower leg: No edema.     Left lower leg: No edema.  Skin:    General: Skin is warm.     Capillary Refill: Capillary refill takes less than 2 seconds.  Neurological:     Mental Status: He is alert. Mental status is at baseline.      ED Treatments / Results  Labs (all labs ordered are  listed, but only abnormal results are displayed) Labs Reviewed  URINALYSIS, ROUTINE W REFLEX MICROSCOPIC - Abnormal; Notable for the following components:      Result Value   Glucose, UA >=500 (*)    Ketones, ur 5 (*)    All other components within normal limits  COMPREHENSIVE METABOLIC PANEL - Abnormal; Notable for the following components:   Glucose, Bld 318 (*)    AST 13 (*)    All other components within normal limits  CBG MONITORING, ED - Abnormal; Notable for the following components:   Glucose-Capillary 353 (*)    All other components within normal limits  CBG MONITORING, ED - Abnormal; Notable for the following components:   Glucose-Capillary 221 (*)    All other components within normal limits  CBC WITH DIFFERENTIAL/PLATELET  CBG MONITORING, ED    EKG EKG Interpretation  Date/Time:  Thursday March 10 2019 10:22:52 EDT Ventricular Rate:  92 PR Interval:    QRS Duration: 92 QT Interval:  354 QTC Calculation: 438 R Axis:   112 Text Interpretation:  Right and left arm electrode reversal, interpretation assumes no reversal Sinus rhythm Anteroseptal infarct, age indeterminate Confirmed by Davonna Belling [phone]) on 03/10/2019 10:43:47 AM Also confirmed by Davonna Belling [phone]), editor Hattie Perch [phone])  on 03/10/2019 2:11:49 PM   Radiology Dg Abdomen Acute W/chest  Result Date: 03/10/2019 CLINICAL DATA:  Vomiting EXAM: DG ABDOMEN ACUTE W/ 1V CHEST COMPARISON:  10/29/2017 FINDINGS: Generalized stool retention. No evidence of small bowel obstruction. No concerning mass effect or gas collection. Normal heart size and mediastinal contours. No acute infiltrate or edema. No effusion or pneumothorax. No acute osseous findings. IMPRESSION: 1. Generalized stool retention. No evidence of small bowel obstruction. 2. Negative chest. Electronically Signed   By: Monte Fantasia M.D.   On: 03/10/2019 11:15    Procedures Procedures (including critical care time)  Medications  Ordered in ED Medications  sodium chloride 0.9 % bolus 500 mL (0 mLs Intravenous Stopped 03/10/19 1145)  sodium chloride 0.9 % bolus 500 mL (0 mLs Intravenous Stopped 03/10/19 1507)     Initial Impression / Assessment and Plan / ED Course  I have reviewed the triage vital signs and the nursing notes.  Pertinent labs & imaging results that were available during my care of the patient were reviewed by me and considered in my medical decision making (see chart for details).        Patient weakness and decreased oral intake.  Hypoglycemia.  Feels better after IV  fluids.  Discussed with patient and his wife.  He feels better.  Both states that he will be eating more.  Previous acute kidney injury has returned to baseline.  Discharge home for outpatient follow-up.  Final Clinical Impressions(s) / ED Diagnoses   Final diagnoses:  Hyperglycemia  Decreased oral intake    ED Discharge Orders    None       Davonna Belling, MD 03/10/19 1517

## 2019-03-10 NOTE — ED Notes (Addendum)
Pt's wife: Everlene Farrier [phone].   Wife made aware she can visit and will be come back after eating breakfast.  She is aware she must wear a mask.

## 2019-03-10 NOTE — ED Notes (Signed)
Discharge instructions reviewed with patient. Patient verbalizes understanding. VSS.   

## 2019-03-10 NOTE — ED Triage Notes (Addendum)
Pt c/o hyperglycemia and "not feeling well" x 2-3 days.  Sts emesis x 1 several days ago.  Denies pain.  Pt reports taking insulin for DM.    Pt is a bad historian.  Hx of dementia.  Will contact wife for additional Hx.      Per note from wife: Pt has not been eating or drinking well x 4 days and multiple episodes of vomiting.  Pt has complained of back pain, as well.

## 2019-03-10 NOTE — ED Notes (Signed)
Pt. Aware of urine specimen. Urinal at the bedside.  Will collect urine when pt. Voids. Nurses aware.

## 2019-03-14 ENCOUNTER — Ambulatory Visit (INDEPENDENT_AMBULATORY_CARE_PROVIDER_SITE_OTHER): Payer: Medicare HMO | Admitting: Family Medicine

## 2019-03-14 ENCOUNTER — Other Ambulatory Visit: Payer: Self-pay

## 2019-03-14 VITALS — BP 90/60 | HR 98 | Temp 97.4°F | Resp 14 | Ht 70.5 in | Wt 169.0 lb

## 2019-03-14 DIAGNOSIS — R112 Nausea with vomiting, unspecified: Secondary | ICD-10-CM

## 2019-03-14 NOTE — Progress Notes (Signed)
Subjective:    Patient ID: Ryan Gilmore, male    DOB: 03/01/1933, 85 y.o.   MRN: [id number]  HPI I recently saw the patient May 21 for his back.  At that time we also check lab work to evaluate his diabetes.  His hemoglobin A1c was found to be elevated at greater than 8.  He was also found to be in acute renal insufficiency with a creatinine of 1.5.  I had recommended starting the patient on insulin.  He was already taking hydrocodone for his back.  Patient never started insulin.  Unfortunately he had to go to the emergency room recently due to abdominal discomfort nausea and vomiting.  The patient and his wife report very little appetite.  The patient's history is complicated by his moderate dementia.  He is able to provide very little history.  However his wife states that he has very little appetite.  He eats a few bites and that he is full quickly demonstrating early satiety.  He is also had several episodes of nausea and vomiting.  However she is unaware of any diarrhea.  She is unaware if he is going to the bathroom to have bowel movements.  They went to the emergency room.  There an abdominal x-ray revealed moderate stool burden throughout the colon.  On rectal exam today, the patient has a large amount of firm stool in the rectal vault although he is not impacted.  I suspect that the patient has moderate to severe constipation brought on by narcotics for chronic back pain and this could be causing his early satiety and nausea and vomiting.  However he is also on Victoza and he is previously had similar symptoms with Ozempic in the past.  The third possibility would be gastroparesis due to his longstanding poorly controlled diabetes. Past Medical History:  Diagnosis Date  . Bleeding duodenal ulcer    ?  1980s  . Dementia ([place])   . Diabetes mellitus without complication ([place])   . Hyperlipidemia    Past Surgical History:  Procedure Laterality Date  . APPENDECTOMY     Current Outpatient  Medications on File Prior to Visit  Medication Sig Dispense Refill  . donepezil (ARICEPT) 5 MG tablet TAKE 1 TABLET BY MOUTH AT BEDTIME (Patient taking differently: Take 5 mg by mouth at bedtime. ) 90 tablet 0  . glucose blood (ACCU-CHEK SMARTVIEW) test strip Check blood sugar 2 times per day dx code E11.65 100 each 5  . HYDROcodone-acetaminophen (NORCO) 7.5-325 MG tablet Take 1 tablet by mouth every 6 (six) hours as needed for moderate pain. 30 tablet 0  . Insulin Pen Needle (B-D UF III MINI PEN NEEDLES) 31G X 5 MM MISC USE WITH VICTOZA 100 each 3  . liraglutide (VICTOZA) 18 MG/3ML SOPN Inject 0.2 mLs (1.2 mg total) into the skin daily. 2 pen 2  . metFORMIN (GLUCOPHAGE) 500 MG tablet TAKE 2 TABLETS BY MOUTH TWICE DAILY WITH A MEAL 120 tablet 1  . pioglitazone (ACTOS) 30 MG tablet TAKE 1 TABLET BY MOUTH ONCE DAILY 90 tablet 1  . pravastatin (PRAVACHOL) 40 MG tablet TAKE 1 TABLET BY MOUTH ONCE DAILY 90 tablet 2   No current facility-administered medications on file prior to visit.    No Known Allergies Social History   Socioeconomic History  . Marital status: Married    Spouse name: Not on file  . Number of children: Not on file  . Years of education: Not on file  . Highest  education level: Not on file  Occupational History  . Not on file  Social Needs  . Financial resource strain: Not on file  . Food insecurity:    Worry: Not on file    Inability: Not on file  . Transportation needs:    Medical: Not on file    Non-medical: Not on file  Tobacco Use  . Smoking status: Never Smoker  . Smokeless tobacco: Current User    Types: Chew  Substance and Sexual Activity  . Alcohol use: Not Currently  . Drug use: Never  . Sexual activity: Not on file  Lifestyle  . Physical activity:    Days per week: Not on file    Minutes per session: Not on file  . Stress: Not on file  Relationships  . Social connections:    Talks on phone: Not on file    Gets together: Not on file    Attends  religious service: Not on file    Active member of club or organization: Not on file    Attends meetings of clubs or organizations: Not on file    Relationship status: Not on file  . Intimate partner violence:    Fear of current or ex partner: Not on file    Emotionally abused: Not on file    Physically abused: Not on file    Forced sexual activity: Not on file  Other Topics Concern  . Not on file  Social History Narrative  . Not on file      Review of Systems  All other systems reviewed and are negative.      Objective:   Physical Exam Vitals signs reviewed.  Cardiovascular:     Rate and Rhythm: Normal rate and regular rhythm.     Heart sounds: Normal heart sounds.  Pulmonary:     Effort: Pulmonary effort is normal. No respiratory distress.     Breath sounds: Normal breath sounds. No stridor. No wheezing, rhonchi or rales.  Abdominal:     General: Bowel sounds are normal. There is no distension.     Palpations: Abdomen is soft. There is no mass.     Tenderness: There is no abdominal tenderness.  Genitourinary:    Rectum: Normal.    Large amount of firm stool in the rectal vault       Assessment & Plan:  Nausea and vomiting, intractability of vomiting not specified, unspecified vomiting type  Differential diagnosis includes severe constipation versus gastroparesis versus nausea and vomiting due to Victoza.  Recommended discontinuation of Victoza and starting Linzess 290 mcg daily for constipation.  Also recommended using a fleets enema daily to help soften the stool in the rectal vault.  Reassess the patient in 1 week.  If symptoms are no better, institute work-up for gastroparesis and consider trying Reglan.  Next week if the patient symptoms are better, we will need to discuss switching the patient to insulin for glycemic control after we discontinue Victoza.  Spent more than 30 minutes today with the patient and his family explaining the differential diagnosis and  the work-up.

## 2019-03-15 ENCOUNTER — Other Ambulatory Visit: Payer: Self-pay | Admitting: Family Medicine

## 2019-03-15 DIAGNOSIS — E1165 Type 2 diabetes mellitus with hyperglycemia: Secondary | ICD-10-CM

## 2019-03-15 DIAGNOSIS — IMO0002 Reserved for concepts with insufficient information to code with codable children: Secondary | ICD-10-CM

## 2019-03-16 ENCOUNTER — Telehealth: Payer: Self-pay | Admitting: *Deleted

## 2019-03-16 ENCOUNTER — Other Ambulatory Visit: Payer: Self-pay | Admitting: Family Medicine

## 2019-03-16 DIAGNOSIS — E1165 Type 2 diabetes mellitus with hyperglycemia: Secondary | ICD-10-CM

## 2019-03-16 DIAGNOSIS — IMO0002 Reserved for concepts with insufficient information to code with codable children: Secondary | ICD-10-CM

## 2019-03-16 NOTE — Telephone Encounter (Signed)
Received call from patient daughter, Camela [phone]~ telephone.   Reports that patient has been taking Linzess 264mcg QD x3 days with no BM. States that enema was given on Monday to assist in softening the stool with no relief. States that patient had episode of emesis today, and remains nauseated.   Advised that patient should be using enema QD per chart note until stool is soft enough to move. Advised that once BM has occurred, continue the Linzess QD and may use the enema Q3 days if no BM noted. Also advised that Miralax can be added daily until patient feels back log of stool has passed.   MD to be made aware.

## 2019-03-17 ENCOUNTER — Encounter (HOSPITAL_COMMUNITY): Payer: Self-pay

## 2019-03-17 ENCOUNTER — Other Ambulatory Visit: Payer: Self-pay

## 2019-03-17 ENCOUNTER — Emergency Department (HOSPITAL_COMMUNITY)
Admission: EM | Admit: 2019-03-17 | Discharge: 2019-03-17 | Disposition: A | Discharge status: Home / Self Care | Payer: Medicare HMO | Attending: Emergency Medicine | Admitting: Emergency Medicine

## 2019-03-17 DIAGNOSIS — R0602 Shortness of breath: Secondary | ICD-10-CM | POA: Diagnosis not present

## 2019-03-17 DIAGNOSIS — R069 Unspecified abnormalities of breathing: Secondary | ICD-10-CM | POA: Diagnosis not present

## 2019-03-17 DIAGNOSIS — N400 Enlarged prostate without lower urinary tract symptoms: Secondary | ICD-10-CM

## 2019-03-17 DIAGNOSIS — K5900 Constipation, unspecified: Secondary | ICD-10-CM | POA: Insufficient documentation

## 2019-03-17 DIAGNOSIS — Z79899 Other long term (current) drug therapy: Secondary | ICD-10-CM | POA: Insufficient documentation

## 2019-03-17 DIAGNOSIS — F0390 Unspecified dementia without behavioral disturbance: Secondary | ICD-10-CM | POA: Diagnosis not present

## 2019-03-17 DIAGNOSIS — Z794 Long term (current) use of insulin: Secondary | ICD-10-CM | POA: Insufficient documentation

## 2019-03-17 DIAGNOSIS — F17220 Nicotine dependence, chewing tobacco, uncomplicated: Secondary | ICD-10-CM | POA: Diagnosis not present

## 2019-03-17 DIAGNOSIS — E119 Type 2 diabetes mellitus without complications: Secondary | ICD-10-CM | POA: Insufficient documentation

## 2019-03-17 DIAGNOSIS — R0902 Hypoxemia: Secondary | ICD-10-CM | POA: Diagnosis not present

## 2019-03-17 MED ORDER — SORBITOL 70 % SOLN
960.0000 mL | TOPICAL_OIL | Freq: Once | ORAL | Status: AC
  Administered 2019-03-17: 960 mL via RECTAL
  Filled 2019-03-17: qty 473

## 2019-03-17 NOTE — ED Notes (Signed)
Received SMOG enema from pharmacy. Will admin.

## 2019-03-17 NOTE — Discharge Instructions (Signed)
Continue to constipation, for at least a month.  Use Linzess as prescribed and take MiraLAX twice a day.  Also make sure he is on a high-fiber diet, and drinking plenty of water each day.  His goal for water intake should be 1 to 2 L of water, each day.

## 2019-03-17 NOTE — ED Notes (Addendum)
Spouse at bedside

## 2019-03-17 NOTE — ED Notes (Signed)
Bed: OO87 Expected date:  Expected time:  Means of arrival:  Comments: EMS-possible constipation

## 2019-03-17 NOTE — Telephone Encounter (Signed)
Call placed to patient daughter, Camela. No answer, no VM.

## 2019-03-17 NOTE — ED Triage Notes (Signed)
Pt arrived from home. Pt reports that he was seen here last Thursday for a bowel obstruction. Pt was given an enema and was hard stool at home. Per EMS pt o2 saturations fluctuates from 83-95% on RA and pt was placed 4 lpm via  and O2 levels improved to 96%.   **Pt is alert and oriented x 4 and is verbal responsive.    EMS v/s 118/p, HR 120, RR 18,

## 2019-03-17 NOTE — Telephone Encounter (Signed)
Please add dulcolax daily as a stimulant laxative.

## 2019-03-17 NOTE — ED Provider Notes (Signed)
[facility] DEPT Provider Note   CSN: [id number] Arrival date & time: 03/17/19  1812    History   Chief Complaint Chief Complaint  Patient presents with   Constipation    HPI NAM Ryan Gilmore is a 85 y.o. male.     HPI   He is here for evaluation of no bowel movement today.  He had a small bowel movement yesterday, after a over-the-counter enema was given.  His chronic constipation, being managed by his PCP with Linzess, intermittent enemas, and recommended to use Dulcolax.  He has not yet started the Dulcolax.  He has not eaten much today but was able to eat yesterday.  There is been no fever, cough, dizziness or weakness.  He is here with his wife gives a history.  Patient is confused and cannot give history.  His wife states he is able to ambulate around the house but she has to help him with various activities of daily living.  There are no other known modifying factors.  Past Medical History:  Diagnosis Date   Bleeding duodenal ulcer    ?  1980s   Dementia ([place])    Diabetes mellitus without complication ()    Hyperlipidemia     Patient Active Problem List   Diagnosis Date Noted   Chronic midline low back pain without sciatica 10/12/2017   Type II or unspecified type diabetes mellitus without mention of complication, uncontrolled 06/28/2013   Other and unspecified hyperlipidemia 06/28/2013    Past Surgical History:  Procedure Laterality Date   APPENDECTOMY          Home Medications    Prior to Admission medications   Medication Sig Start Date End Date Taking? Authorizing Provider  donepezil (ARICEPT) 5 MG tablet TAKE 1 TABLET BY MOUTH AT BEDTIME Patient taking differently: Take 5 mg by mouth at bedtime.  12/09/18  Yes [name], MD  lisinopril (ZESTRIL) 10 MG tablet Take 1 tablet by mouth once daily 03/16/19  Yes Pickard, Cammie [name], MD  metFORMIN (GLUCOPHAGE) 500 MG tablet TAKE 2 TABLETS BY MOUTH TWICE DAILY WITH A  MEAL Patient taking differently: Take 1,000 mg by mouth 2 (two) times daily with a meal.  03/01/19  Yes [name], MD  pioglitazone (ACTOS) 30 MG tablet TAKE 1 TABLET BY MOUTH ONCE DAILY Patient taking differently: Take 30 mg by mouth daily.  10/05/17  Yes [name], MD  pravastatin (PRAVACHOL) 40 MG tablet TAKE 1 TABLET BY MOUTH ONCE DAILY Patient taking differently: Take 40 mg by mouth daily.  08/30/18  Yes [name], MD  glucose blood (ACCU-CHEK SMARTVIEW) test strip Check blood sugar 2 times per day dx code E11.65 02/24/19   [name], MD  HYDROcodone-acetaminophen (NORCO) 7.5-325 MG tablet Take 1 tablet by mouth every 6 (six) hours as needed for moderate pain. Patient not taking: Reported on 03/17/2019 02/24/19   [name], MD  Insulin Pen Needle (B-D UF III MINI PEN NEEDLES) 31G X 5 MM MISC USE WITH VICTOZA 02/23/19   Delsa Grana, PA-C  liraglutide (VICTOZA) 18 MG/3ML SOPN Inject 0.2 mLs (1.2 mg total) into the skin daily. Patient not taking: Reported on 03/17/2019 04/27/18   [name], MD    Family History Family History  Problem Relation Age of Onset   Heart disease Father    Diabetes Brother     Social History Social History   Tobacco Use   Smoking status: Never Smoker  Smokeless tobacco: Current User    Types: Chew  Substance Use Topics   Alcohol use: Not Currently   Drug use: Never     Allergies   Patient has no known allergies.   Review of Systems Review of Systems  All other systems reviewed and are negative.    Physical Exam Updated Vital Signs BP 104/74    Pulse (!) 104    Temp 97.6 F (36.4 C) (Oral)    Resp 17    SpO2 91%   Physical Exam Vitals signs and nursing note reviewed.  Constitutional:      General: He is not in acute distress.    Appearance: He is well-developed. He is not ill-appearing, toxic-appearing or diaphoretic.  HENT:     Head: Normocephalic and atraumatic.     Right Ear:  External ear normal.     Left Ear: External ear normal.  Eyes:     Conjunctiva/sclera: Conjunctivae normal.     Pupils: Pupils are equal, round, and reactive to light.  Neck:     Musculoskeletal: Normal range of motion and neck supple.     Trachea: Phonation normal.  Cardiovascular:     Rate and Rhythm: Normal rate and regular rhythm.     Heart sounds: Normal heart sounds.  Pulmonary:     Effort: Pulmonary effort is normal.     Breath sounds: Normal breath sounds.  Abdominal:     General: There is no distension.     Palpations: Abdomen is soft. There is no mass.     Tenderness: There is no abdominal tenderness.  Genitourinary:    Comments: Normal anus.  Small amount of brown stool in rectum.  Prostate somewhat enlarged, irregular, and tender to palpation. Musculoskeletal: Normal range of motion.  Skin:    General: Skin is warm and dry.  Neurological:     Mental Status: He is alert.     Cranial Nerves: No cranial nerve deficit.     Sensory: No sensory deficit.     Motor: No abnormal muscle tone.     Coordination: Coordination normal.  Psychiatric:        Mood and Affect: Mood normal.        Behavior: Behavior normal.      ED Treatments / Results  Labs (all labs ordered are listed, but only abnormal results are displayed) Labs Reviewed - No data to display  EKG None  Radiology No results found.  Procedures Procedures (including critical care time)  Medications Ordered in ED Medications  sorbitol, milk of mag, mineral oil, glycerin (SMOG) enema (960 mLs Rectal Given 03/17/19 2126)     Initial Impression / Assessment and Plan / ED Course  I have reviewed the triage vital signs and the nursing notes.  Pertinent labs & imaging results that were available during my care of the patient were reviewed by me and considered in my medical decision making (see chart for details).         Patient Vitals for the past 24 hrs:  BP Temp Temp src Pulse Resp SpO2    03/17/19 2230 104/74 -- -- (!) 104 -- 91 %  03/17/19 2130 108/67 -- -- (!) 112 -- (!) 88 %  03/17/19 2100 105/80 -- -- (!) 104 -- 90 %  03/17/19 2030 107/83 -- -- (!) 110 -- 91 %  03/17/19 2000 102/83 -- -- (!) 109 17 92 %  03/17/19 1930 117/80 -- -- (!) 108 13 92 %  03/17/19 1911 -- -- -- -- --  93 %  03/17/19 1901 -- 97.6 F (36.4 C) Oral (!) 11 16 --  03/17/19 1901 123/84 -- -- (!) 111 -- 94 %  03/17/19 1900 -- -- -- (!) 110 -- 93 %  03/17/19 1859 -- -- -- (!) 111 -- 94 %    10:59 PM Reevaluation with update and discussion. After initial assessment and treatment, an updated evaluation reveals his wife states he had very good results with the enema.  The patient is comfortable.  Findings discussed and questions answered.Daleen Bo   Medical Decision Making: Constipation, with improvement after enema.  Doubt bowel obstruction, metabolic instability or serious bacterial infection.  CRITICAL CARE-no Performed by: Daleen Bo  Nursing Notes Reviewed/ Care Coordinated Applicable Imaging Reviewed Interpretation of Laboratory Data incorporated into ED treatment  The patient appears reasonably screened and/or stabilized for discharge and I doubt any other medical condition or other [facility] requiring further screening, evaluation, or treatment in the ED at this time prior to discharge.  Plan: Home Medications-continue usual medications; Home Treatments-rest, fluids, high-fiber diet; return here if the recommended treatment, does not improve the symptoms; Recommended follow up-PCP, 1 week for checkup and further evaluation of prostate.   Final Clinical Impressions(s) / ED Diagnoses   Final diagnoses:  Constipation, unspecified constipation type  Prostate enlargement    ED Discharge Orders    None       Daleen Bo, MD 03/17/19 2301

## 2019-03-18 NOTE — Telephone Encounter (Signed)
Patient noted to have ER visit on 03/17/2019. Per notes, large amount of stool digitally emoved from rectum.  Call placed to wife., advise to continue Linzess and Dulcolax. May give enema if >3 days with no BM.   Verbalized understanding.

## 2019-03-21 ENCOUNTER — Encounter: Payer: Self-pay | Admitting: Family Medicine

## 2019-03-21 ENCOUNTER — Other Ambulatory Visit: Payer: Self-pay | Admitting: Family Medicine

## 2019-03-21 ENCOUNTER — Other Ambulatory Visit: Payer: Self-pay

## 2019-03-21 ENCOUNTER — Ambulatory Visit (INDEPENDENT_AMBULATORY_CARE_PROVIDER_SITE_OTHER): Payer: Medicare HMO | Admitting: Family Medicine

## 2019-03-21 VITALS — BP 128/88 | HR 108 | Temp 97.3°F | Resp 16 | Ht 70.5 in | Wt 163.0 lb

## 2019-03-21 DIAGNOSIS — N400 Enlarged prostate without lower urinary tract symptoms: Secondary | ICD-10-CM

## 2019-03-21 DIAGNOSIS — M545 Low back pain, unspecified: Secondary | ICD-10-CM

## 2019-03-21 DIAGNOSIS — E785 Hyperlipidemia, unspecified: Secondary | ICD-10-CM | POA: Diagnosis not present

## 2019-03-21 DIAGNOSIS — IMO0002 Reserved for concepts with insufficient information to code with codable children: Secondary | ICD-10-CM

## 2019-03-21 DIAGNOSIS — E118 Type 2 diabetes mellitus with unspecified complications: Secondary | ICD-10-CM

## 2019-03-21 DIAGNOSIS — G8929 Other chronic pain: Secondary | ICD-10-CM

## 2019-03-21 DIAGNOSIS — R112 Nausea with vomiting, unspecified: Secondary | ICD-10-CM | POA: Diagnosis not present

## 2019-03-21 DIAGNOSIS — F0280 Dementia in other diseases classified elsewhere without behavioral disturbance: Secondary | ICD-10-CM | POA: Diagnosis not present

## 2019-03-21 DIAGNOSIS — R634 Abnormal weight loss: Secondary | ICD-10-CM

## 2019-03-21 DIAGNOSIS — E1165 Type 2 diabetes mellitus with hyperglycemia: Secondary | ICD-10-CM

## 2019-03-21 DIAGNOSIS — R64 Cachexia: Secondary | ICD-10-CM | POA: Diagnosis not present

## 2019-03-21 DIAGNOSIS — E119 Type 2 diabetes mellitus without complications: Secondary | ICD-10-CM | POA: Diagnosis not present

## 2019-03-21 MED ORDER — AZITHROMYCIN 250 MG PO TABS: ORAL_TABLET | ORAL | 0 refills | Status: DC

## 2019-03-21 MED ORDER — METFORMIN HCL 500 MG PO TABS: ORAL_TABLET | ORAL | 1 refills | Status: DC

## 2019-03-21 NOTE — Progress Notes (Signed)
Subjective:    Patient ID: Ryan Gilmore, male    DOB: 30-Aug-1933, 85 y.o.   MRN: [id number]  HPI  03/14/19 I recently saw the patient May 21 for his back.  At that time we also check lab work to evaluate his diabetes.  His hemoglobin A1c was found to be elevated at greater than 8.  He was also found to be in acute renal insufficiency with a creatinine of 1.5.  I had recommended starting the patient on insulin.  He was already taking hydrocodone for his back.  Patient never started insulin.  Unfortunately he had to go to the emergency room recently due to abdominal discomfort nausea and vomiting.  The patient and his wife report very little appetite.  The patient's history is complicated by his moderate dementia.  He is able to provide very little history.  However his wife states that he has very little appetite.  He eats a few bites and that he is full quickly demonstrating early satiety.  He is also had several episodes of nausea and vomiting.  However she is unaware of any diarrhea.  She is unaware if he is going to the bathroom to have bowel movements.  They went to the emergency room.  There an abdominal x-ray revealed moderate stool burden throughout the colon.  On rectal exam today, the patient has a large amount of firm stool in the rectal vault although he is not impacted.  I suspect that the patient has moderate to severe constipation brought on by narcotics for chronic back pain and this could be causing his early satiety and nausea and vomiting.  However he is also on Victoza and he is previously had similar symptoms with Ozempic in the past.  The third possibility would be gastroparesis due to his longstanding poorly controlled diabetes.  At that time, my plan was: Differential diagnosis includes severe constipation versus gastroparesis versus nausea and vomiting due to Victoza.  Recommended discontinuation of Victoza and starting Linzess 290 mcg daily for constipation.  Also recommended using a  fleets enema daily to help soften the stool in the rectal vault.  Reassess the patient in 1 week.  If symptoms are no better, institute work-up for gastroparesis and consider trying Reglan.  Next week if the patient symptoms are better, we will need to discuss switching the patient to insulin for glycemic control after we discontinue Victoza.  Spent more than 30 minutes today with the patient and his family explaining the differential diagnosis and the work-up.  03/21/19 The patient's wife contacted me June 11 stating that he had not had a bowel movement after trying the Linzess and the fleets enema for 3 days.  We recommended adding Dulcolax.  However, she took him to the emergency room.  There he was given an enema and had a bowel movement Thursday evening.  Saturday and Sunday his appetite has improved.  This morning his appetite has improved.  He is actually gained 2 pounds since last week.  Unfortunately he still not eating very much up until the last 48 hours.  Of course his appetite did seem to improve after he had a large bowel movement on Saturday.  There is continuing to use Linzess but they are not seeing regular bowel movements.  They have avoided giving him pain medication last week.  They are also not giving him Victoza.  However his wife is not checking his blood sugars.  Patient appears frail and cachectic.  His memory loss  is obviously worsening.  He denies any nausea.  He denies any vomiting.  He denies any abdominal pain.  However his wife asked him if his back hurting and he says yes.  I am not sure how much is truly pain in his back and how much she is simply saying yes to her questions out of confusion but I am concerned that this may lead to over use of narcotics contributing to his constipation.  His appetite has also been in steady decline to this point suggesting likely a component of his dementia as well.  Since I last saw him he has developed a persistent cough.  He coughs every 10 to  15 seconds in the exam room today.  His lungs are clear to auscultation and he is not in any respiratory distress however the cough is persistent and hacking.  They deny any fevers Past Medical History:  Diagnosis Date   Bleeding duodenal ulcer    ?  1980s   Dementia ([place])    Diabetes mellitus without complication ([place])    Hyperlipidemia    Past Surgical History:  Procedure Laterality Date   APPENDECTOMY     Current Outpatient Medications on File Prior to Visit  Medication Sig Dispense Refill   donepezil (ARICEPT) 5 MG tablet TAKE 1 TABLET BY MOUTH AT BEDTIME (Patient taking differently: Take 5 mg by mouth at bedtime. ) 90 tablet 0   glucose blood (ACCU-CHEK SMARTVIEW) test strip Check blood sugar 2 times per day dx code E11.65 100 each 5   Insulin Pen Needle (B-D UF III MINI PEN NEEDLES) 31G X 5 MM MISC USE WITH VICTOZA 100 each 3   linaclotide (LINZESS) 290 MCG CAPS capsule Take 290 mcg by mouth daily before breakfast.     lisinopril (ZESTRIL) 10 MG tablet Take 1 tablet by mouth once daily 90 tablet 0   pioglitazone (ACTOS) 30 MG tablet TAKE 1 TABLET BY MOUTH ONCE DAILY 90 tablet 1   pravastatin (PRAVACHOL) 40 MG tablet TAKE 1 TABLET BY MOUTH ONCE DAILY 90 tablet 2   No current facility-administered medications on file prior to visit.    No Known Allergies Social History   Socioeconomic History   Marital status: Married    Spouse name: Not on file   Number of children: Not on file   Years of education: Not on file   Highest education level: Not on file  Occupational History   Not on file  Social Needs   Financial resource strain: Not on file   Food insecurity    Worry: Not on file    Inability: Not on file   Transportation needs    Medical: Not on file    Non-medical: Not on file  Tobacco Use   Smoking status: Never Smoker   Smokeless tobacco: Current User    Types: Chew  Substance and Sexual Activity   Alcohol use: Not Currently   Drug use:  Never   Sexual activity: Not on file  Lifestyle   Physical activity    Days per week: Not on file    Minutes per session: Not on file   Stress: Not on file  Relationships   Social connections    Talks on phone: Not on file    Gets together: Not on file    Attends religious service: Not on file    Active member of club or organization: Not on file    Attends meetings of clubs or organizations: Not on file  Relationship status: Not on file   Intimate partner violence    Fear of current or ex partner: Not on file    Emotionally abused: Not on file    Physically abused: Not on file    Forced sexual activity: Not on file  Other Topics Concern   Not on file  Social History Narrative   Not on file      Review of Systems  All other systems reviewed and are negative.      Objective:     Physical Exam Vitals signs reviewed.  Cardiovascular:     Rate and Rhythm: Normal rate and regular rhythm.     Heart sounds: Normal heart sounds.  Pulmonary:     Effort: Pulmonary effort is normal. No respiratory distress.     Breath sounds: Normal breath sounds. No stridor. No wheezing, rhonchi or rales.  Abdominal:     General: Bowel sounds are normal. There is no distension.     Palpations: Abdomen is soft. There is no mass.     Tenderness: There is no abdominal tenderness.       Assessment & Plan:  1. Benign prostatic hyperplasia without lower urinary tract symptoms - CBC with Differential/Platelet - COMPLETE METABOLIC PANEL WITH GFR - PSA  2. Nausea and vomiting, intractability of vomiting not specified, unspecified vomiting type  3. Uncontrolled type 2 diabetes mellitus with complication, without long-term current use of insulin ([place])  4. Chronic midline low back pain without sciatica  5. Weight loss  6. Dementia associated with other underlying disease without behavioral disturbance ([place])   I had a long discussion today with the patient, his daughter, and his  wife.  He appears to continue to be declining.  My gut instinct is that this represents decline in his dementia.  I believe this is likely suppressing his appetite.  This coupled with chronic constipation is likely causing the patient not to eat very much.  I would continue to refrain from using Victoza and I would recommend using a stool softener such as Linzess or MiraLAX coupled with Dulcolax as a stimulant laxative every day.  Recheck later this week to see if his appetite has improved and that hopefully he is having regular bowel movements.  Counseled his wife not to overuse the pain medication.  I do not believe that every time he agrees that his back hurting he is truly in pain.  He appears comfortable today in no distress yet he agrees with her when she asked if his back is hurting.  Patient continues to report feeling just weak and tired.  His prostate was enlarged on his last rectal exam and therefore I will check a PSA to see if there could be an element of an underlying prostate malignancy contributing to his failure to thrive.  We had a long discussion about replacing Victoza with insulin.  [name] dementia prevents him from managing his insulin.  Wife is also demonstrating memory loss and I do not believe she can administer the insulin safely.  Therefore I recommended that if his blood sugars are under 300 would not add insulin and focus more on quality of life rather than quantity.  Both the patient's wife and the patient's daughter agreed to this plan.  They realize the repercussions but they also realize the logistics of administering insulin and right now this is not possible at home with his wife

## 2019-03-22 LAB — CBC WITH DIFFERENTIAL/PLATELET
Absolute Monocytes: 752 cells/uL (ref 200–950)
Basophils Absolute: 52 cells/uL (ref 0–200)
Basophils Relative: 0.5 %
Eosinophils Absolute: 185 cells/uL (ref 15–500)
Eosinophils Relative: 1.8 %
HCT: 45.9 % (ref 38.5–50.0)
Hemoglobin: 15.6 g/dL (ref 13.2–17.1)
Lymphs Abs: 3935 cells/uL — ABNORMAL HIGH (ref 850–3900)
MCH: 30.3 pg (ref 27.0–33.0)
MCHC: 34 g/dL (ref 32.0–36.0)
MCV: 89.1 fL (ref 80.0–100.0)
MPV: 11.6 fL (ref 7.5–12.5)
Monocytes Relative: 7.3 %
Neutro Abs: 5377 cells/uL (ref 1500–7800)
Neutrophils Relative %: 52.2 %
Platelets: 194 10*3/uL (ref 140–400)
RBC: 5.15 10*6/uL (ref 4.20–5.80)
RDW: 12.9 % (ref 11.0–15.0)
Total Lymphocyte: 38.2 %
WBC: 10.3 10*3/uL (ref 3.8–10.8)

## 2019-03-22 LAB — COMPLETE METABOLIC PANEL WITH GFR
AG Ratio: 1.9 (calc) (ref 1.0–2.5)
ALT: 11 U/L (ref 9–46)
AST: 16 U/L (ref 10–35)
Albumin: 4.6 g/dL (ref 3.6–5.1)
Alkaline phosphatase (APISO): 56 U/L (ref 35–144)
BUN/Creatinine Ratio: 10 (calc) (ref 6–22)
BUN: 14 mg/dL (ref 7–25)
CO2: 18 mmol/L — ABNORMAL LOW (ref 20–32)
Calcium: 9.9 mg/dL (ref 8.6–10.3)
Chloride: 99 mmol/L (ref 98–110)
Creat: 1.35 mg/dL — ABNORMAL HIGH (ref 0.70–1.11)
GFR, Est African American: 56 mL/min/{1.73_m2} — ABNORMAL LOW (ref >=60)
GFR, Est Non African American: 48 mL/min/{1.73_m2} — ABNORMAL LOW (ref >=60)
Globulin: 2.4 g/dL (calc) (ref 1.9–3.7)
Glucose, Bld: 277 mg/dL — ABNORMAL HIGH (ref 65–99)
Potassium: 4.2 mmol/L (ref 3.5–5.3)
Sodium: 134 mmol/L — ABNORMAL LOW (ref 135–146)
Total Bilirubin: 0.8 mg/dL (ref 0.2–1.2)
Total Protein: 7 g/dL (ref 6.1–8.1)

## 2019-03-22 LAB — PSA: PSA: 10.1 ng/mL — ABNORMAL HIGH (ref <=4.0)

## 2019-03-23 ENCOUNTER — Other Ambulatory Visit: Payer: Self-pay | Admitting: Family Medicine

## 2019-03-23 DIAGNOSIS — R972 Elevated prostate specific antigen [PSA]: Secondary | ICD-10-CM

## 2019-03-23 MED ORDER — GLIPIZIDE ER 10 MG PO TB24: 10.0000 mg | ORAL_TABLET | Freq: Every day | ORAL | 1 refills | Status: DC

## 2019-03-24 ENCOUNTER — Other Ambulatory Visit: Payer: Self-pay | Admitting: *Deleted

## 2019-03-24 MED ORDER — LANCETS MISC: 3 refills | Status: AC

## 2019-03-24 MED ORDER — BLOOD GLUCOSE SYSTEM PAK KIT: PACK | 1 refills | Status: AC

## 2019-03-24 MED ORDER — BLOOD GLUCOSE TEST VI STRP: ORAL_STRIP | 11 refills | Status: DC

## 2019-03-30 ENCOUNTER — Other Ambulatory Visit: Payer: Self-pay | Admitting: Family Medicine

## 2019-03-30 MED ORDER — BLOOD GLUCOSE TEST VI STRP: ORAL_STRIP | 11 refills | Status: AC

## 2019-04-02 ENCOUNTER — Other Ambulatory Visit: Payer: Self-pay | Admitting: Family Medicine

## 2019-04-02 ENCOUNTER — Emergency Department (HOSPITAL_COMMUNITY)
Admission: EM | Admit: 2019-04-02 | Discharge: 2019-04-03 | Disposition: A | Discharge status: Home / Self Care | Payer: Medicare HMO | Attending: Emergency Medicine | Admitting: Emergency Medicine

## 2019-04-02 ENCOUNTER — Other Ambulatory Visit: Payer: Self-pay

## 2019-04-02 DIAGNOSIS — I959 Hypotension, unspecified: Secondary | ICD-10-CM | POA: Insufficient documentation

## 2019-04-02 DIAGNOSIS — Z7984 Long term (current) use of oral hypoglycemic drugs: Secondary | ICD-10-CM | POA: Insufficient documentation

## 2019-04-02 DIAGNOSIS — F0390 Unspecified dementia without behavioral disturbance: Secondary | ICD-10-CM | POA: Diagnosis not present

## 2019-04-02 DIAGNOSIS — R531 Weakness: Secondary | ICD-10-CM | POA: Diagnosis not present

## 2019-04-02 DIAGNOSIS — E119 Type 2 diabetes mellitus without complications: Secondary | ICD-10-CM | POA: Insufficient documentation

## 2019-04-02 DIAGNOSIS — Z79899 Other long term (current) drug therapy: Secondary | ICD-10-CM | POA: Insufficient documentation

## 2019-04-02 DIAGNOSIS — R404 Transient alteration of awareness: Secondary | ICD-10-CM | POA: Diagnosis not present

## 2019-04-02 DIAGNOSIS — F17220 Nicotine dependence, chewing tobacco, uncomplicated: Secondary | ICD-10-CM | POA: Diagnosis not present

## 2019-04-02 DIAGNOSIS — E860 Dehydration: Secondary | ICD-10-CM | POA: Insufficient documentation

## 2019-04-02 LAB — CBC WITH DIFFERENTIAL/PLATELET
Abs Immature Granulocytes: 0.02 10*3/uL (ref 0.00–0.07)
Basophils Absolute: 0.1 10*3/uL (ref 0.0–0.1)
Basophils Relative: 1 %
Eosinophils Absolute: 0.3 10*3/uL (ref 0.0–0.5)
Eosinophils Relative: 2 %
HCT: 39.2 % (ref 39.0–52.0)
Hemoglobin: 12.7 g/dL — ABNORMAL LOW (ref 13.0–17.0)
Immature Granulocytes: 0 %
Lymphocytes Relative: 39 %
Lymphs Abs: 4.2 10*3/uL — ABNORMAL HIGH (ref 0.7–4.0)
MCH: 30.2 pg (ref 26.0–34.0)
MCHC: 32.4 g/dL (ref 30.0–36.0)
MCV: 93.3 fL (ref 80.0–100.0)
Monocytes Absolute: 0.6 10*3/uL (ref 0.1–1.0)
Monocytes Relative: 6 %
Neutro Abs: 5.7 10*3/uL (ref 1.7–7.7)
Neutrophils Relative %: 52 %
Platelets: 195 10*3/uL (ref 150–400)
RBC: 4.2 MIL/uL — ABNORMAL LOW (ref 4.22–5.81)
RDW: 13.3 % (ref 11.5–15.5)
WBC: 10.8 10*3/uL — ABNORMAL HIGH (ref 4.0–10.5)
nRBC: 0 % (ref 0.0–0.2)

## 2019-04-02 LAB — URINALYSIS, ROUTINE W REFLEX MICROSCOPIC
Bilirubin Urine: NEGATIVE
Glucose, UA: NEGATIVE mg/dL
Hgb urine dipstick: NEGATIVE
Ketones, ur: NEGATIVE mg/dL
Leukocytes,Ua: NEGATIVE
Nitrite: NEGATIVE
Protein, ur: NEGATIVE mg/dL
Specific Gravity, Urine: 1.01 (ref 1.005–1.030)
pH: 6 (ref 5.0–8.0)

## 2019-04-02 MED ORDER — SODIUM CHLORIDE 0.9 % IV BOLUS (SEPSIS)
1000.0000 mL | Freq: Once | INTRAVENOUS | Status: AC
  Administered 2019-04-02: 1000 mL via INTRAVENOUS

## 2019-04-02 NOTE — ED Provider Notes (Signed)
[name] DEPT Provider Note   CSN: [id number] Arrival date & time: 04/02/19  2229     History   Chief Complaint Chief Complaint  Patient presents with  . Weakness  . Hypotension   Level 5 caveat due to dementia HPI Ryan Gilmore is a 85 y.o. male.     The history is provided by the patient and the EMS personnel. The history is limited by the condition of the patient.  Weakness Severity:  Moderate Onset quality:  Gradual Duration:  4 hours Timing:  Constant Progression:  Improving Chronicity:  New Relieved by: IV fluids. Worsened by:  Activity Associated symptoms: no abdominal pain, no chest pain, no cough, no fever, no shortness of breath and no vomiting    Patient history of diabetes, hyperlipidemia, dementia presents with generalized weakness. Is reported that he has had generalized weakness for 4 hours.  It worsens when he stands. EMS was called, and on their arrival patient was hypotensive with blood pressure of 78/50.  He received IV fluids and improved. He currently denies any acute complaints. No fever/vomiting/chest pain/shortness of breath.  Past Medical History:  Diagnosis Date  . Bleeding duodenal ulcer    ?  1980s  . Dementia ([place])   . Diabetes mellitus without complication ([place])   . Hyperlipidemia     Patient Active Problem List   Diagnosis Date Noted  . Chronic midline low back pain without sciatica 10/12/2017  . Type II or unspecified type diabetes mellitus without mention of complication, uncontrolled 06/28/2013  . Other and unspecified hyperlipidemia 06/28/2013    Past Surgical History:  Procedure Laterality Date  . APPENDECTOMY          Home Medications    Prior to Admission medications   Medication Sig Start Date End Date Taking? Authorizing Provider  azithromycin (ZITHROMAX) 250 MG tablet 2 tabs poqday1, 1 tab poqday 2-5 03/21/19   [name], MD  Blood Glucose Monitoring Suppl (BLOOD  GLUCOSE SYSTEM PAK) KIT Please dispense based on patient and insurance preference. Use as directed to monitor FSBS 2x. Dx: E11.E11.65. 03/24/19   [name], MD  donepezil (ARICEPT) 5 MG tablet TAKE 1 TABLET BY MOUTH AT BEDTIME Patient taking differently: Take 5 mg by mouth at bedtime.  12/09/18   [name], MD  glipiZIDE (GLUCOTROL XL) 10 MG 24 hr tablet Take 1 tablet (10 mg total) by mouth daily with breakfast. 03/23/19   [name], MD  Glucose Blood (BLOOD GLUCOSE TEST STRIPS) STRP Please dispense one touch verio flex lancets, and testing strips. Use as directed to monitor FSBS 2x. Dx: E11.E11.65. 03/30/19   [name], MD  Lancets MISC Please dispense based on patient and insurance preference. Use as directed to monitor FSBS 2x. Dx: E11.E11.65. 03/24/19   [name], MD  linaclotide Rolan Lipa) 290 MCG CAPS capsule Take 290 mcg by mouth daily before breakfast.    [provider]  lisinopril (ZESTRIL) 10 MG tablet Take 1 tablet by mouth once daily 03/16/19   [name], MD  metFORMIN (GLUCOPHAGE) 500 MG tablet TAKE 2 TABLETS BY MOUTH TWICE DAILY WITH A MEAL 03/21/19   [name], MD  pioglitazone (ACTOS) 30 MG tablet TAKE 1 TABLET BY MOUTH ONCE DAILY 10/05/17   [name], MD  pravastatin (PRAVACHOL) 40 MG tablet TAKE 1 TABLET BY MOUTH ONCE DAILY 08/30/18   [name], MD    Family History Family History  Problem Relation Age of Onset  . Heart disease Father   . Diabetes Brother     Social History Social History   Tobacco Use  . Smoking status: Never Smoker  . Smokeless tobacco: Current User    Types: Chew  Substance Use Topics  . Alcohol use: Not Currently  . Drug use: Never     Allergies   Patient has no known allergies.   Review of Systems Review of Systems  Unable to perform ROS: Dementia  Constitutional: Negative for fever.  Respiratory: Negative for cough and shortness of breath.   Cardiovascular:  Negative for chest pain.  Gastrointestinal: Negative for abdominal pain and vomiting.  Neurological: Positive for weakness.     Physical Exam Updated Vital Signs BP 112/70   Pulse 84   Temp 97.8 F (36.6 C) (Oral)   Resp (!) 22   Ht 1.803 m (5' 11" )   Wt 81.6 kg   SpO2 96%   BMI 25.10 kg/m   Physical Exam CONSTITUTIONAL: Elderly, no acute distress HEAD: Normocephalic/atraumatic EYES: EOMI/PERRL ENMT: Mucous membranes moist NECK: supple no meningeal signs SPINE/BACK:entire spine nontender CV: S1/S2 noted, no murmurs/rubs/gallops noted LUNGS: Lungs are clear to auscultation bilaterally, no apparent distress ABDOMEN: soft, nontender, no rebound or guarding, bowel sounds noted throughout abdomen GU:no cva tenderness NEURO: Pt is awake/alert/appropriate, moves all extremitiesx4.  No facial droop.  No arm or leg drift EXTREMITIES: pulses normal/equal, full ROM SKIN: warm, color normal   ED Treatments / Results  Labs (all labs ordered are listed, but only abnormal results are displayed) Labs Reviewed  BASIC METABOLIC PANEL - Abnormal; Notable for the following components:      Result Value   Glucose, Bld 229 (*)    Calcium 8.5 (*)    GFR calc non Af Amer 56 (*)    All other components within normal limits  CBC WITH DIFFERENTIAL/PLATELET - Abnormal; Notable for the following components:   WBC 10.8 (*)    RBC 4.20 (*)    Hemoglobin 12.7 (*)    Lymphs Abs 4.2 (*)    All other components within normal limits  URINALYSIS, ROUTINE W REFLEX MICROSCOPIC    EKG EKG Interpretation  Date/Time:  Saturday April 02 2019 23:30:19 EDT Ventricular Rate:  84 PR Interval:    QRS Duration: 96 QT Interval:  358 QTC Calculation: 424 R Axis:   72 Text Interpretation:  Sinus rhythm Probable left ventricular hypertrophy No significant change since last tracing Confirmed by Ripley Fraise [phone]) on 04/03/2019 12:53:40 AM   Radiology No results found.  Procedures Procedures    Medications Ordered in ED Medications  sodium chloride 0.9 % bolus 1,000 mL (1,000 mLs Intravenous New Bag/Given 04/02/19 2343)     Initial Impression / Assessment and Plan / ED Course  I have reviewed the triage vital signs and the nursing notes.  Pertinent labs results that were available during my care of the patient were reviewed by me and considered in my medical decision making (see chart for details).        11:28 PM Attempted to call wife to get collateral information, no answer.  Patient is otherwise well-appearing.  Labs and EKG are pending 1:03 AM Wife is at bedside now.  She reports over the past day he has had decreased appetite and decreased p.o. intake.  She reports he appeared more fatigued and weak, but no focal weakness.  No falls.  He did have recent change in his diabetic meds. Overall he  appears well.  Vitals are improved.  He walked in the ER without difficulty.  Patient is now taking fluids.  Labs overall reassuring. No abrupt EKG changes   He appears appropriate for Discharge home. No signs of acute stroke or other emergent condition  Final Clinical Impressions(s) / ED Diagnoses   Final diagnoses:  Dehydration  Weakness    ED Discharge Orders    None       Ripley Fraise, MD 04/03/19 0104

## 2019-04-02 NOTE — ED Triage Notes (Signed)
Per EMS - Pt coming from home with CC of generalized weakness x approx 4 hours. Worsens when he stands. Hx of bowel obstruction, but no current complaints. With EMS BP  78/50 before receiving 1000L bolus NS. Hx of dementia currently A+0 x 3 (-time)

## 2019-04-02 NOTE — ED Notes (Signed)
Bed: QP84 Expected date:  Expected time:  Means of arrival:  Comments: Zap @ 9:30

## 2019-04-03 LAB — BASIC METABOLIC PANEL
Anion gap: 10 (ref 5–15)
BUN: 14 mg/dL (ref 8–23)
CO2: 22 mmol/L (ref 22–32)
Calcium: 8.5 mg/dL — ABNORMAL LOW (ref 8.9–10.3)
Chloride: 105 mmol/L (ref 98–111)
Creatinine, Ser: 1.19 mg/dL (ref 0.61–1.24)
GFR calc Af Amer: >60 mL/min (ref >60)
GFR calc non Af Amer: 56 mL/min — ABNORMAL LOW (ref >60)
Glucose, Bld: 229 mg/dL — ABNORMAL HIGH (ref 70–99)
Potassium: 4.2 mmol/L (ref 3.5–5.1)
Sodium: 137 mmol/L (ref 135–145)

## 2019-04-03 NOTE — ED Notes (Signed)
Pt family would like an update

## 2019-04-03 NOTE — ED Notes (Signed)
Pt ambulated with minimal assistance. Pt has steady gait

## 2019-04-03 NOTE — Discharge Instructions (Addendum)

## 2019-04-25 ENCOUNTER — Other Ambulatory Visit: Payer: Self-pay

## 2019-04-26 ENCOUNTER — Emergency Department (HOSPITAL_COMMUNITY): Payer: Medicare HMO

## 2019-04-26 ENCOUNTER — Ambulatory Visit (INDEPENDENT_AMBULATORY_CARE_PROVIDER_SITE_OTHER): Payer: Medicare HMO | Admitting: Family Medicine

## 2019-04-26 ENCOUNTER — Encounter: Payer: Self-pay | Admitting: Family Medicine

## 2019-04-26 ENCOUNTER — Encounter (HOSPITAL_COMMUNITY): Payer: Self-pay

## 2019-04-26 ENCOUNTER — Inpatient Hospital Stay (HOSPITAL_COMMUNITY)
Admission: EM | Admit: 2019-04-26 | Discharge: 2019-04-29 | DRG: 175 | Disposition: A | Discharge status: Home Health | Payer: Medicare HMO | Attending: Internal Medicine | Admitting: Internal Medicine

## 2019-04-26 ENCOUNTER — Other Ambulatory Visit: Payer: Self-pay

## 2019-04-26 VITALS — BP 100/70 | HR 110 | Temp 97.8°F | Resp 22 | Ht 70.5 in | Wt 163.0 lb

## 2019-04-26 DIAGNOSIS — Z7189 Other specified counseling: Secondary | ICD-10-CM | POA: Diagnosis not present

## 2019-04-26 DIAGNOSIS — Z79899 Other long term (current) drug therapy: Secondary | ICD-10-CM | POA: Diagnosis not present

## 2019-04-26 DIAGNOSIS — I10 Essential (primary) hypertension: Secondary | ICD-10-CM

## 2019-04-26 DIAGNOSIS — I129 Hypertensive chronic kidney disease with stage 1 through stage 4 chronic kidney disease, or unspecified chronic kidney disease: Secondary | ICD-10-CM | POA: Diagnosis present

## 2019-04-26 DIAGNOSIS — Z515 Encounter for palliative care: Secondary | ICD-10-CM

## 2019-04-26 DIAGNOSIS — F0150 Vascular dementia without behavioral disturbance: Secondary | ICD-10-CM

## 2019-04-26 DIAGNOSIS — E785 Hyperlipidemia, unspecified: Secondary | ICD-10-CM | POA: Diagnosis not present

## 2019-04-26 DIAGNOSIS — F17220 Nicotine dependence, chewing tobacco, uncomplicated: Secondary | ICD-10-CM | POA: Diagnosis present

## 2019-04-26 DIAGNOSIS — R0902 Hypoxemia: Secondary | ICD-10-CM | POA: Diagnosis not present

## 2019-04-26 DIAGNOSIS — J9601 Acute respiratory failure with hypoxia: Secondary | ICD-10-CM

## 2019-04-26 DIAGNOSIS — Z20828 Contact with and (suspected) exposure to other viral communicable diseases: Secondary | ICD-10-CM | POA: Diagnosis not present

## 2019-04-26 DIAGNOSIS — N179 Acute kidney failure, unspecified: Secondary | ICD-10-CM

## 2019-04-26 DIAGNOSIS — E1159 Type 2 diabetes mellitus with other circulatory complications: Secondary | ICD-10-CM

## 2019-04-26 DIAGNOSIS — E860 Dehydration: Secondary | ICD-10-CM | POA: Diagnosis present

## 2019-04-26 DIAGNOSIS — Z833 Family history of diabetes mellitus: Secondary | ICD-10-CM

## 2019-04-26 DIAGNOSIS — E1165 Type 2 diabetes mellitus with hyperglycemia: Secondary | ICD-10-CM | POA: Diagnosis present

## 2019-04-26 DIAGNOSIS — Z1159 Encounter for screening for other viral diseases: Secondary | ICD-10-CM | POA: Diagnosis not present

## 2019-04-26 DIAGNOSIS — R627 Adult failure to thrive: Secondary | ICD-10-CM | POA: Diagnosis not present

## 2019-04-26 DIAGNOSIS — R000 Tachycardia, unspecified: Secondary | ICD-10-CM

## 2019-04-26 DIAGNOSIS — R0602 Shortness of breath: Secondary | ICD-10-CM | POA: Diagnosis not present

## 2019-04-26 DIAGNOSIS — Z7984 Long term (current) use of oral hypoglycemic drugs: Secondary | ICD-10-CM | POA: Diagnosis not present

## 2019-04-26 DIAGNOSIS — I82411 Acute embolism and thrombosis of right femoral vein: Secondary | ICD-10-CM | POA: Diagnosis not present

## 2019-04-26 DIAGNOSIS — I2699 Other pulmonary embolism without acute cor pulmonale: Principal | ICD-10-CM

## 2019-04-26 DIAGNOSIS — E1122 Type 2 diabetes mellitus with diabetic chronic kidney disease: Secondary | ICD-10-CM | POA: Diagnosis present

## 2019-04-26 DIAGNOSIS — R739 Hyperglycemia, unspecified: Secondary | ICD-10-CM

## 2019-04-26 DIAGNOSIS — I82812 Embolism and thrombosis of superficial veins of left lower extremities: Secondary | ICD-10-CM | POA: Diagnosis present

## 2019-04-26 DIAGNOSIS — R05 Cough: Secondary | ICD-10-CM | POA: Diagnosis not present

## 2019-04-26 DIAGNOSIS — F0390 Unspecified dementia without behavioral disturbance: Secondary | ICD-10-CM | POA: Diagnosis not present

## 2019-04-26 DIAGNOSIS — E119 Type 2 diabetes mellitus without complications: Secondary | ICD-10-CM

## 2019-04-26 DIAGNOSIS — IMO0002 Reserved for concepts with insufficient information to code with codable children: Secondary | ICD-10-CM

## 2019-04-26 DIAGNOSIS — J9621 Acute and chronic respiratory failure with hypoxia: Secondary | ICD-10-CM | POA: Diagnosis not present

## 2019-04-26 DIAGNOSIS — N182 Chronic kidney disease, stage 2 (mild): Secondary | ICD-10-CM | POA: Diagnosis present

## 2019-04-26 DIAGNOSIS — I361 Nonrheumatic tricuspid (valve) insufficiency: Secondary | ICD-10-CM | POA: Diagnosis not present

## 2019-04-26 DIAGNOSIS — I82503 Chronic embolism and thrombosis of unspecified deep veins of lower extremity, bilateral: Secondary | ICD-10-CM | POA: Diagnosis not present

## 2019-04-26 DIAGNOSIS — IMO0001 Reserved for inherently not codable concepts without codable children: Secondary | ICD-10-CM

## 2019-04-26 LAB — CBC WITH DIFFERENTIAL/PLATELET
Abs Immature Granulocytes: 0.03 10*3/uL (ref 0.00–0.07)
Basophils Absolute: 0 10*3/uL (ref 0.0–0.1)
Basophils Relative: 0 %
Eosinophils Absolute: 0 10*3/uL (ref 0.0–0.5)
Eosinophils Relative: 0 %
HCT: 42.8 % (ref 39.0–52.0)
Hemoglobin: 14.2 g/dL (ref 13.0–17.0)
Immature Granulocytes: 0 %
Lymphocytes Relative: 24 %
Lymphs Abs: 2.5 10*3/uL (ref 0.7–4.0)
MCH: 30.5 pg (ref 26.0–34.0)
MCHC: 33.2 g/dL (ref 30.0–36.0)
MCV: 92 fL (ref 80.0–100.0)
Monocytes Absolute: 0.7 10*3/uL (ref 0.1–1.0)
Monocytes Relative: 6 %
Neutro Abs: 7.2 10*3/uL (ref 1.7–7.7)
Neutrophils Relative %: 70 %
Platelets: 181 10*3/uL (ref 150–400)
RBC: 4.65 MIL/uL (ref 4.22–5.81)
RDW: 13.8 % (ref 11.5–15.5)
WBC: 10.4 10*3/uL (ref 4.0–10.5)
nRBC: 0 % (ref 0.0–0.2)

## 2019-04-26 LAB — COMPREHENSIVE METABOLIC PANEL
ALT: 12 U/L (ref 0–44)
AST: 15 U/L (ref 15–41)
Albumin: 4.2 g/dL (ref 3.5–5.0)
Alkaline Phosphatase: 55 U/L (ref 38–126)
Anion gap: 12 (ref 5–15)
BUN: 16 mg/dL (ref 8–23)
CO2: 21 mmol/L — ABNORMAL LOW (ref 22–32)
Calcium: 9.2 mg/dL (ref 8.9–10.3)
Chloride: 103 mmol/L (ref 98–111)
Creatinine, Ser: 1.25 mg/dL — ABNORMAL HIGH (ref 0.61–1.24)
GFR calc Af Amer: >60 mL/min (ref >60)
GFR calc non Af Amer: 53 mL/min — ABNORMAL LOW (ref >60)
Glucose, Bld: 321 mg/dL — ABNORMAL HIGH (ref 70–99)
Potassium: 4.6 mmol/L (ref 3.5–5.1)
Sodium: 136 mmol/L (ref 135–145)
Total Bilirubin: 0.8 mg/dL (ref 0.3–1.2)
Total Protein: 7.2 g/dL (ref 6.5–8.1)

## 2019-04-26 LAB — PROCALCITONIN: Procalcitonin: <0.1 ng/mL

## 2019-04-26 LAB — FIBRINOGEN: Fibrinogen: 560 mg/dL — ABNORMAL HIGH (ref 210–475)

## 2019-04-26 LAB — HEPARIN LEVEL (UNFRACTIONATED): Heparin Unfractionated: 0.32 IU/mL (ref 0.30–0.70)

## 2019-04-26 LAB — C-REACTIVE PROTEIN: CRP: 10.8 mg/dL — ABNORMAL HIGH (ref <1.0)

## 2019-04-26 LAB — GLUCOSE, CAPILLARY: Glucose-Capillary: 233 mg/dL — ABNORMAL HIGH (ref 70–99)

## 2019-04-26 LAB — FERRITIN: Ferritin: 211 ng/mL (ref 24–336)

## 2019-04-26 LAB — SARS CORONAVIRUS 2 BY RT PCR (HOSPITAL ORDER, PERFORMED IN ~~LOC~~ HOSPITAL LAB): SARS Coronavirus 2: NEGATIVE

## 2019-04-26 LAB — MAGNESIUM: Magnesium: 1.8 mg/dL (ref 1.7–2.4)

## 2019-04-26 LAB — LACTIC ACID, PLASMA
Lactic Acid, Venous: 1.9 mmol/L (ref 0.5–1.9)
Lactic Acid, Venous: 1.6 mmol/L (ref 0.5–1.9)

## 2019-04-26 LAB — BRAIN NATRIURETIC PEPTIDE: B Natriuretic Peptide: 474.4 pg/mL — ABNORMAL HIGH (ref 0.0–100.0)

## 2019-04-26 LAB — TSH: TSH: 1.253 u[IU]/mL (ref 0.350–4.500)

## 2019-04-26 LAB — TRIGLYCERIDES: Triglycerides: 70 mg/dL (ref <150)

## 2019-04-26 LAB — D-DIMER, QUANTITATIVE: D-Dimer, Quant: 2.13 ug/mL-FEU — ABNORMAL HIGH (ref 0.00–0.50)

## 2019-04-26 LAB — LACTATE DEHYDROGENASE: LDH: 126 U/L (ref 98–192)

## 2019-04-26 LAB — MRSA PCR SCREENING: MRSA by PCR: NEGATIVE

## 2019-04-26 MED ORDER — HEPARIN (PORCINE) 25000 UT/250ML-% IV SOLN
1300.0000 [IU]/h | INTRAVENOUS | Status: DC
  Administered 2019-04-27: 1300 [IU]/h via INTRAVENOUS
  Filled 2019-04-26: qty 250

## 2019-04-26 MED ORDER — SODIUM CHLORIDE 0.9 % IV SOLN
INTRAVENOUS | Status: DC
  Administered 2019-04-26 – 2019-04-28 (×3): via INTRAVENOUS

## 2019-04-26 MED ORDER — ONDANSETRON HCL 4 MG PO TABS: 4.0000 mg | ORAL_TABLET | Freq: Four times a day (QID) | ORAL | Status: DC | PRN

## 2019-04-26 MED ORDER — CHLORHEXIDINE GLUCONATE CLOTH 2 % EX PADS
6.0000 | MEDICATED_PAD | Freq: Every day | CUTANEOUS | Status: DC
  Administered 2019-04-26 – 2019-04-27 (×2): 6 via TOPICAL

## 2019-04-26 MED ORDER — INSULIN ASPART 100 UNIT/ML ~~LOC~~ SOLN: 0.0000 [IU] | Freq: Every day | SUBCUTANEOUS | Status: DC

## 2019-04-26 MED ORDER — HEPARIN (PORCINE) 25000 UT/250ML-% IV SOLN
1200.0000 [IU]/h | INTRAVENOUS | Status: DC
  Administered 2019-04-26 (×2): 1200 [IU]/h via INTRAVENOUS
  Filled 2019-04-26: qty 250

## 2019-04-26 MED ORDER — INSULIN ASPART 100 UNIT/ML ~~LOC~~ SOLN
0.0000 [IU] | Freq: Three times a day (TID) | SUBCUTANEOUS | Status: DC
  Administered 2019-04-26 – 2019-04-27 (×2): 5 [IU] via SUBCUTANEOUS
  Administered 2019-04-27: 3 [IU] via SUBCUTANEOUS
  Administered 2019-04-27: 5 [IU] via SUBCUTANEOUS
  Administered 2019-04-28: 8 [IU] via SUBCUTANEOUS
  Administered 2019-04-28: 5 [IU] via SUBCUTANEOUS
  Administered 2019-04-28 – 2019-04-29 (×2): 3 [IU] via SUBCUTANEOUS
  Administered 2019-04-29: 8 [IU] via SUBCUTANEOUS

## 2019-04-26 MED ORDER — DONEPEZIL HCL 5 MG PO TABS
5.0000 mg | ORAL_TABLET | Freq: Every day | ORAL | Status: DC
  Administered 2019-04-26 – 2019-04-28 (×3): 5 mg via ORAL
  Filled 2019-04-26 (×3): qty 1

## 2019-04-26 MED ORDER — ACETAMINOPHEN 325 MG PO TABS: 650.0000 mg | ORAL_TABLET | Freq: Four times a day (QID) | ORAL | Status: DC | PRN

## 2019-04-26 MED ORDER — HEPARIN BOLUS VIA INFUSION
2500.0000 [IU] | Freq: Once | INTRAVENOUS | Status: AC
  Administered 2019-04-26: 2500 [IU] via INTRAVENOUS
  Filled 2019-04-26: qty 2500

## 2019-04-26 MED ORDER — ACETAMINOPHEN 650 MG RE SUPP: 650.0000 mg | Freq: Four times a day (QID) | RECTAL | Status: DC | PRN

## 2019-04-26 MED ORDER — IOHEXOL 350 MG/ML SOLN
100.0000 mL | Freq: Once | INTRAVENOUS | Status: AC | PRN
  Administered 2019-04-26: 68 mL via INTRAVENOUS

## 2019-04-26 MED ORDER — LINACLOTIDE 145 MCG PO CAPS
290.0000 ug | ORAL_CAPSULE | Freq: Every day | ORAL | Status: DC
  Administered 2019-04-27 – 2019-04-29 (×2): 290 ug via ORAL
  Filled 2019-04-26 (×4): qty 2

## 2019-04-26 MED ORDER — ORAL CARE MOUTH RINSE
15.0000 mL | Freq: Two times a day (BID) | OROMUCOSAL | Status: DC
  Administered 2019-04-26 – 2019-04-28 (×4): 15 mL via OROMUCOSAL

## 2019-04-26 MED ORDER — SODIUM CHLORIDE (PF) 0.9 % IJ SOLN
INTRAMUSCULAR | Status: AC
  Administered 2019-04-26: 15:00:00
  Filled 2019-04-26: qty 50

## 2019-04-26 MED ORDER — ONDANSETRON HCL 4 MG/2ML IJ SOLN: 4.0000 mg | Freq: Four times a day (QID) | INTRAMUSCULAR | Status: DC | PRN

## 2019-04-26 MED ORDER — PRAVASTATIN SODIUM 40 MG PO TABS
40.0000 mg | ORAL_TABLET | Freq: Every day | ORAL | Status: DC
  Administered 2019-04-26 – 2019-04-29 (×4): 40 mg via ORAL
  Filled 2019-04-26 (×2): qty 1
  Filled 2019-04-26 (×2): qty 2

## 2019-04-26 NOTE — ED Notes (Addendum)
Per wife and daughter, pt had been "out of it" for the last several days. Pt has hx of mild dementia. Pt is usually oriented to the year, but not day/month.   Daughter is Cam [phone]

## 2019-04-26 NOTE — Progress Notes (Signed)
ANTICOAGULATION CONSULT NOTE   Pharmacy Consult for Heparin Indication: pulmonary embolus  No Known Allergies  Patient Measurements:   Heparin Dosing Weight: 73,9 kg  Vital Signs: Temp: 98.3 F (36.8 C) (07/21 0856) Temp Source: Oral (07/21 0856) BP: 106/78 (07/21 1300) Pulse Rate: 98 (07/21 1300)  Labs: Recent Labs    04/26/19 1000  HGB 14.2  HCT 42.8  PLT 181  CREATININE 1.25*    Estimated Creatinine Clearance: 46.8 mL/min (A) (by C-G formula based on SCr of 1.25 mg/dL (H)).   Medical History: Past Medical History:  Diagnosis Date  . Bleeding duodenal ulcer    ?  1980s  . Dementia ([place])   . Diabetes mellitus without complication ([place])   . Hyperlipidemia     Medications:  Scheduled:  Infusions:   Assessment:85 yoM was tachy, hypoxic at MD office. Sent to ED, PE on CT Baseline CBC wnl, Ddimer 2.13, Fibrinogen elevated  Heparin 2500 unit bolus, infusion at 1200 units/hr  Goal of Therapy:  Heparin level 0.3-0.7 units/ml Monitor platelets by anticoagulation protocol: Yes   Plan:  Check 6 hr Hep level, ~ 2100 tonight Daily CBC while on Heparin Order daily Hep level at steady state Assess ability to transition to po anti-coag  Minda Ditto PharmD 04/26/2019,2:38 PM

## 2019-04-26 NOTE — Progress Notes (Signed)
ANTICOAGULATION CONSULT NOTE   Pharmacy Consult for Heparin Indication: pulmonary embolus  No Known Allergies  Patient Measurements: Height: 5\' 11"  (180.3 cm) Weight: 164 lb 3.9 oz (74.5 kg) IBW/kg (Calculated) : 75.3 Heparin Dosing Weight: 73,9 kg  Vital Signs: Temp: 96.6 F (35.9 C) (07/21 1650) Temp Source: Oral (07/21 1650) BP: 116/79 (07/21 1800) Pulse Rate: 99 (07/21 1800)  Labs: Recent Labs    04/26/19 1000 04/26/19 2046  HGB 14.2  --   HCT 42.8  --   PLT 181  --   HEPARINUNFRC  --  0.32  CREATININE 1.25*  --     Estimated Creatinine Clearance: 47.2 mL/min (A) (by C-G formula based on SCr of 1.25 mg/dL (H)).   Medical History: Past Medical History:  Diagnosis Date  . Bleeding duodenal ulcer    ?  1980s  . Dementia ([place])   . Diabetes mellitus without complication ([place])   . Hyperlipidemia     Medications:  Scheduled:  Infusions:   Assessment:85 yoM was tachy, hypoxic at MD office. Sent to ED, PE on CT Baseline CBC wnl, Ddimer 2.13, Fibrinogen elevated  Heparin 2500 unit bolus, infusion at 1200 units/hr 2046 HL = 0.32 at goal, no bleeding or infusion issues per RN.   Goal of Therapy:  Heparin level 0.3-0.7 units/ml Monitor platelets by anticoagulation protocol: Yes   Plan:  Increase heparin drip to 1300 units/hr to ensure does not fall below goal as bolus wears off Daily CBC while on Heparin Order daily Hep level at steady state Assess ability to transition to po anti-coag  Dorrene German PharmD 04/26/2019,9:42 PM

## 2019-04-26 NOTE — H&P (Addendum)
History and Physical    Ryan Gilmore EVO:[id number] DOB: [birth year] DOA: 04/26/2019  PCP: Ryan Frizzle, MD  Patient coming from: Home/PCP office  I have personally briefly reviewed patient's old medical records in [place]  Chief Complaint: No complaint from patient/hypoxia at PCP office  HPI: Ryan Gilmore is a 85 y.o. male with medical history significant of hypertension, hyperlipidemia, dementia and type 2 diabetes mellitus was sent to the emergency department from his PCPs office for further evaluation of hypoxia.  Reportedly, patient went to see his PCP along with his wife for routine checkup however he was found to be hypoxic with saturation about 80% on room air so he was sent to the emergency department.  Patient has severe dementia to the point that he does not know why he is in the hospital.  When I talked to him, he did not even recall that he saw his PCP today.  I called patient's wife on the phone and she verified that patient has not been having any complaints such as chest pain, shortness of breath, fever, chills, sweating, nausea, vomiting, any problem with urination or with bowel movement.   ED Course: Upon arrival to the emergency department, patient was slightly tachypneic and tachycardic however blood pressure was within normal limits.  He was requiring 3 L of oxygen to keep his oxygen saturation more than 90%.  Initial work-up was directed toward possible COVID-19 infection.  Inflammatory markers were obtained and half of them are elevated.  Subsequently CT angiogram of the chest was done and he was diagnosed with heavy load of bilateral PE with right heart strain.  He was started on heparin and hospital service was consulted to admit the patient for further management.  Review of Systems: As per HPI otherwise negative.    Past Medical History:  Diagnosis Date   Bleeding duodenal ulcer    ?  1980s   Dementia ([place])    Diabetes mellitus without complication  ([place])    Hyperlipidemia     Past Surgical History:  Procedure Laterality Date   APPENDECTOMY       reports that he has never smoked. His smokeless tobacco use includes chew. He reports previous alcohol use. He reports that he does not use drugs.  No Known Allergies  Family History  Problem Relation Age of Onset   Heart disease Father    Diabetes Brother     Prior to Admission medications   Medication Sig Start Date End Date Taking? Authorizing Provider  donepezil (ARICEPT) 5 MG tablet TAKE 1 TABLET BY MOUTH AT BEDTIME Patient taking differently: Take 5 mg by mouth at bedtime.  04/04/19  Yes Ryan Frizzle, MD  glipiZIDE (GLUCOTROL XL) 10 MG 24 hr tablet Take 1 tablet (10 mg total) by mouth daily with breakfast. 03/23/19  Yes Ryan Frizzle, MD  linaclotide (LINZESS) 290 MCG CAPS capsule Take 290 mcg by mouth daily before breakfast.   Yes [provider]  lisinopril (ZESTRIL) 10 MG tablet Take 1 tablet by mouth once daily Patient taking differently: Take 10 mg by mouth daily.  03/16/19  Yes Ryan Frizzle, MD  metFORMIN (GLUCOPHAGE) 500 MG tablet TAKE 2 TABLETS BY MOUTH TWICE DAILY WITH A MEAL Patient taking differently: Take 1,000 mg by mouth 2 (two) times daily with a meal.  03/21/19  Yes Pickard, Cammie [name], MD  pioglitazone (ACTOS) 30 MG tablet TAKE 1 TABLET BY MOUTH ONCE DAILY Patient taking differently: Take 30 mg  by mouth daily.  10/05/17  Yes Ryan Frizzle, MD  pravastatin (PRAVACHOL) 40 MG tablet TAKE 1 TABLET BY MOUTH ONCE DAILY Patient taking differently: Take 40 mg by mouth daily.  08/30/18  Yes Ryan Frizzle, MD  Blood Glucose Monitoring Suppl (BLOOD GLUCOSE SYSTEM PAK) KIT Please dispense based on patient and insurance preference. Use as directed to monitor FSBS 2x. Dx: E11.E11.65. 03/24/19   Ryan Frizzle, MD  Glucose Blood (BLOOD GLUCOSE TEST STRIPS) STRP Please dispense one touch verio flex lancets, and testing strips. Use as directed to  monitor FSBS 2x. Dx: E11.E11.65. 03/30/19   Ryan Frizzle, MD  Lancets MISC Please dispense based on patient and insurance preference. Use as directed to monitor FSBS 2x. Dx: E11.E11.65. 03/24/19   Ryan Frizzle, MD    Physical Exam: Vitals:   04/26/19 1100 04/26/19 1200 04/26/19 1300 04/26/19 1500  BP: 108/76 113/77 106/78 111/76  Pulse: (!) 104 99 98 (!) 102  Resp: (!) 26 (!) 27 (!) 26 20  Temp:      TempSrc:      SpO2: 96% 93% 96% 97%    Constitutional: NAD, calm, comfortable Vitals:   04/26/19 1100 04/26/19 1200 04/26/19 1300 04/26/19 1500  BP: 108/76 113/77 106/78 111/76  Pulse: (!) 104 99 98 (!) 102  Resp: (!) 26 (!) 27 (!) 26 20  Temp:      TempSrc:      SpO2: 96% 93% 96% 97%   Eyes: PERRL, lids and conjunctivae normal ENMT: Mucous membranes are moist. Posterior pharynx clear of any exudate or lesions.Normal dentition.  Neck: normal, supple, no masses, no thyromegaly Respiratory: clear to auscultation bilaterally, no wheezing, no crackles. Normal respiratory effort. No accessory muscle use.  Cardiovascular: Regular rate and rhythm, no murmurs / rubs / gallops. No extremity edema. 2+ pedal pulses. No carotid bruits.  Abdomen: no tenderness, no masses palpated. No hepatosplenomegaly. Bowel sounds positive.  Musculoskeletal: no clubbing / cyanosis. No joint deformity upper and lower extremities. Good ROM, no contractures. Normal muscle tone.  Skin: no rashes, lesions, ulcers. No induration Neurologic: CN 2-12 grossly intact. Sensation intact, DTR normal. Strength 5/5 in all 4.  Psychiatric: Poor judgment and insight. Alert and oriented x 2. Normal mood.    Labs on Admission: I have personally reviewed following labs and imaging studies  CBC: Recent Labs  Lab 04/26/19 1000  WBC 10.4  NEUTROABS 7.2  HGB 14.2  HCT 42.8  MCV 92.0  PLT 591   Basic Metabolic Panel: Recent Labs  Lab 04/26/19 1000  NA 136  K 4.6  CL 103  CO2 21*  GLUCOSE 321*  BUN 16    CREATININE 1.25*  CALCIUM 9.2   GFR: Estimated Creatinine Clearance: 46.8 mL/min (A) (by C-G formula based on SCr of 1.25 mg/dL (H)). Liver Function Tests: Recent Labs  Lab 04/26/19 1000  AST 15  ALT 12  ALKPHOS 55  BILITOT 0.8  PROT 7.2  ALBUMIN 4.2   No results for input(s): LIPASE, AMYLASE in the last 168 hours. No results for input(s): AMMONIA in the last 168 hours. Coagulation Profile: No results for input(s): INR, PROTIME in the last 168 hours. Cardiac Enzymes: No results for input(s): CKTOTAL, CKMB, CKMBINDEX, TROPONINI in the last 168 hours. BNP (last 3 results) No results for input(s): PROBNP in the last 8760 hours. HbA1C: No results for input(s): HGBA1C in the last 72 hours. CBG: No results for input(s): GLUCAP in the last 168 hours. Lipid Profile:  Recent Labs    04/26/19 1000  TRIG 70   Thyroid Function Tests: No results for input(s): TSH, T4TOTAL, FREET4, T3FREE, THYROIDAB in the last 72 hours. Anemia Panel: Recent Labs    04/26/19 1000  FERRITIN 211   Urine analysis:    Component Value Date/Time   COLORURINE YELLOW 04/02/2019 2324   APPEARANCEUR CLEAR 04/02/2019 2324   LABSPEC 1.010 04/02/2019 2324   PHURINE 6.0 04/02/2019 2324   GLUCOSEU NEGATIVE 04/02/2019 2324   HGBUR NEGATIVE 04/02/2019 2324   BILIRUBINUR NEGATIVE 04/02/2019 2324   KETONESUR NEGATIVE 04/02/2019 2324   PROTEINUR NEGATIVE 04/02/2019 2324   NITRITE NEGATIVE 04/02/2019 2324   LEUKOCYTESUR NEGATIVE 04/02/2019 2324    Radiological Exams on Admission: Ct Angio Chest Pe W And/or Wo Contrast  Result Date: 04/26/2019 CLINICAL DATA:  PE suspected, shortness of breath, dry cough EXAM: CT ANGIOGRAPHY CHEST WITH CONTRAST TECHNIQUE: Multidetector CT imaging of the chest was performed using the standard protocol during bolus administration of intravenous contrast. Multiplanar CT image reconstructions and MIPs were obtained to evaluate the vascular anatomy. CONTRAST:  71m OMNIPAQUE  IOHEXOL 350 MG/ML SOLN COMPARISON:  None. FINDINGS: Cardiovascular: Satisfactory opacification of the pulmonary arteries to the segmental level. Positive examination for pulmonary embolism with extensive bilateral lobar to segmental embolus in all lobes. The RV LV ratio is elevated at approximately 1.6:1. Normal heart size. No pericardial effusion. Mediastinum/Nodes: No enlarged mediastinal, hilar, or axillary lymph nodes. Thyroid gland, trachea, and esophagus demonstrate no significant findings. Lungs/Pleura: Evaluation of the lung parenchyma is limited by breath motion artifact. Scattered ground-glass opacities, for example in the apical left upper lobe (series 11, image 35). No pleural effusion or pneumothorax. Upper Abdomen: No acute abnormality. Musculoskeletal: No chest wall abnormality. No acute or significant osseous findings. Review of the MIP images confirms the above findings. IMPRESSION: 1. Positive examination for pulmonary embolism with extensive bilateral lobar to segmental embolus in all lobes. 2. The RV LV ratio is elevated at approximately 1.6:1, concerning for right heart strain. Correlate with echocardiographic findings. 3. Scattered ground-glass opacities, for example in the apical left upper lobe (series 11, image 35). These are nonspecific and likely infectious or inflammatory and do not particularly appear to be in a distribution distal to embolus or in a configuration suggesting developing pulmonary infarction. These results were called by telephone at the time of interpretation on 04/26/2019 at 2:31 pm to Dr. JDorie Rank, who verbally acknowledged these results. Electronically Signed   By: AEddie CandleM.D.   On: 04/26/2019 14:31   Dg Chest Port 1 View  Result Date: 04/26/2019 CLINICAL DATA:  Shortness of breath, cough EXAM: PORTABLE CHEST 1 VIEW COMPARISON:  None. FINDINGS: Heart and mediastinal contours are within normal limits. No focal opacities or effusions. No acute bony  abnormality. IMPRESSION: No active disease. Electronically Signed   By: KRolm BaptiseM.D.   On: 04/26/2019 10:14    EKG: Independently reviewed.  Sinus tachycardia with left atrial enlargement.  No acute ST-T wave changes.  Assessment/Plan Active Problems:   Type 2 diabetes mellitus without complication (HCC)   Bilateral pulmonary embolism (HCC)   Essential hypertension   AKI (acute kidney injury) ([place]    Bilateral pulmonary embolism with right heart strain: We will continue him on heparin to be dosed by pharmacy.  Admit to stepdown unit for close monitoring.  I did explain to his wife about the management plan with anticoagulation and all the potential side effects of anticoagulants including but not limited  to spontaneous bleeding.  She verbalized understanding and agreed with the plan of care.  Has slightly elevated BNP.  No baseline known.  No other BNP available in chart.  Will obtain transthoracic echo.  Hypertension: Currently, patient's blood pressure is on low normal side.  With heavy load of PE, he is at risk of dropping his blood pressure so I will hold antihypertensives and monitor closely.  AKI: Could very well be prerenal.  We will hydrate him gently and recheck labs in the morning.  Electrolytes stable.  Hyperlipidemia: Resume home medications.  Type 2 diabetes mellitus: Hold oral hypoglycemic agents.  Start on SSI.  DVT prophylaxis: Heparin drip Code Status: Full code, confirmed by wife Family Communication: Discussed with wife over the phone. Disposition Plan: Likely home in 48 to 72 hours. Consults called: None Admission status: Inpatient   Darliss Cheney MD [facility] Pager [phone]  If 7PM-7AM, please contact night-coverage [email or web address] Password [facility]  04/26/2019, 3:22 PM

## 2019-04-26 NOTE — ED Provider Notes (Signed)
[place] DEPT Provider Note   CSN: [id number] Arrival date & time: 04/26/19  2563    History   Chief Complaint Chief Complaint  Patient presents with  . Shortness of Breath    HPI Ryan Gilmore is a 85 y.o. male.     HPI Pt states he is not sure why he is here in the ED.  Pt states he feels pretty good.  He has no complaints.  No CP, HA or abd pain.  He feels like his breathing is fine.  According to the medical records patient was at his doctor's office today.  He was noted to be hypoxic and tachycardic.  Family members indicated that the patient had been coughing at home for several weeks.  He has not had much of an appetite and has not been eating well.  They have not noticed any fevers at home.  Patient was sent to the ED for further evaluation. Past Medical History:  Diagnosis Date  . Bleeding duodenal ulcer    ?  1980s  . Dementia ([place])   . Diabetes mellitus without complication ([place])   . Hyperlipidemia     Patient Active Problem List   Diagnosis Date Noted  . Chronic midline low back pain without sciatica 10/12/2017  . Type II or unspecified type diabetes mellitus without mention of complication, uncontrolled 06/28/2013  . Other and unspecified hyperlipidemia 06/28/2013    Past Surgical History:  Procedure Laterality Date  . APPENDECTOMY          Home Medications    Prior to Admission medications   Medication Sig Start Date End Date Taking? Authorizing Provider  donepezil (ARICEPT) 5 MG tablet TAKE 1 TABLET BY MOUTH AT BEDTIME Patient taking differently: Take 5 mg by mouth at bedtime.  04/04/19  Yes [name], MD  glipiZIDE (GLUCOTROL XL) 10 MG 24 hr tablet Take 1 tablet (10 mg total) by mouth daily with breakfast. 03/23/19  Yes [name], MD  linaclotide (LINZESS) 290 MCG CAPS capsule Take 290 mcg by mouth daily before breakfast.   Yes [provider]  lisinopril (ZESTRIL) 10 MG tablet Take 1 tablet  by mouth once daily Patient taking differently: Take 10 mg by mouth daily.  03/16/19  Yes [name], MD  metFORMIN (GLUCOPHAGE) 500 MG tablet TAKE 2 TABLETS BY MOUTH TWICE DAILY WITH A MEAL Patient taking differently: Take 1,000 mg by mouth 2 (two) times daily with a meal.  03/21/19  Yes Pickard, Cammie [name], MD  pioglitazone (ACTOS) 30 MG tablet TAKE 1 TABLET BY MOUTH ONCE DAILY Patient taking differently: Take 30 mg by mouth daily.  10/05/17  Yes [name], MD  pravastatin (PRAVACHOL) 40 MG tablet TAKE 1 TABLET BY MOUTH ONCE DAILY Patient taking differently: Take 40 mg by mouth daily.  08/30/18  Yes [name], MD  Blood Glucose Monitoring Suppl (BLOOD GLUCOSE SYSTEM PAK) KIT Please dispense based on patient and insurance preference. Use as directed to monitor FSBS 2x. Dx: E11.E11.65. 03/24/19   [name], MD  Glucose Blood (BLOOD GLUCOSE TEST STRIPS) STRP Please dispense one touch verio flex lancets, and testing strips. Use as directed to monitor FSBS 2x. Dx: E11.E11.65. 03/30/19   [name], MD  Lancets MISC Please dispense based on patient and insurance preference. Use as directed to monitor FSBS 2x. Dx: E11.E11.65. 03/24/19   [name], MD    Family History Family History  Problem Relation Age  of Onset  . Heart disease Father   . Diabetes Brother     Social History Social History   Tobacco Use  . Smoking status: Never Smoker  . Smokeless tobacco: Current User    Types: Chew  Substance Use Topics  . Alcohol use: Not Currently  . Drug use: Never     Allergies   Patient has no known allergies.   Review of Systems Review of Systems  All other systems reviewed and are negative.    Physical Exam Updated Vital Signs BP 113/77   Pulse 99   Temp 98.3 F (36.8 C) (Oral)   Resp (!) 27   SpO2 93%   Physical Exam Vitals signs and nursing note reviewed.  Constitutional:      General: He is not in acute distress.    Appearance: He  is well-developed.  HENT:     Head: Normocephalic and atraumatic.     Right Ear: External ear normal.     Left Ear: External ear normal.  Eyes:     General: No scleral icterus.       Right eye: No discharge.        Left eye: No discharge.     Conjunctiva/sclera: Conjunctivae normal.  Neck:     Musculoskeletal: Neck supple.     Trachea: No tracheal deviation.  Cardiovascular:     Rate and Rhythm: Normal rate and regular rhythm.  Pulmonary:     Effort: Pulmonary effort is normal. Tachypnea present. No respiratory distress.     Breath sounds: Normal breath sounds. No stridor. No wheezing or rales.  Abdominal:     General: Bowel sounds are normal. There is no distension.     Palpations: Abdomen is soft.     Tenderness: There is no abdominal tenderness. There is no guarding or rebound.  Musculoskeletal:        General: No tenderness.  Skin:    General: Skin is warm and dry.     Findings: No rash.  Neurological:     Mental Status: He is alert.     Cranial Nerves: No cranial nerve deficit (no facial droop, extraocular movements intact, no slurred speech).     Sensory: No sensory deficit.     Motor: No abnormal muscle tone or seizure activity.     Coordination: Coordination normal.      ED Treatments / Results  Labs (all labs ordered are listed, but only abnormal results are displayed) Labs Reviewed  COMPREHENSIVE METABOLIC PANEL - Abnormal; Notable for the following components:      Result Value   CO2 21 (*)    Glucose, Bld 321 (*)    Creatinine, Ser 1.25 (*)    GFR calc non Af Amer 53 (*)    All other components within normal limits  D-DIMER, QUANTITATIVE (NOT AT [facility]) - Abnormal; Notable for the following components:   D-Dimer, Quant 2.13 (*)    All other components within normal limits  FIBRINOGEN - Abnormal; Notable for the following components:   Fibrinogen 560 (*)    All other components within normal limits  C-REACTIVE PROTEIN - Abnormal; Notable for the  following components:   CRP 10.8 (*)    All other components within normal limits  BRAIN NATRIURETIC PEPTIDE - Abnormal; Notable for the following components:   B Natriuretic Peptide 474.4 (*)    All other components within normal limits  SARS CORONAVIRUS 2 (HOSPITAL ORDER, [facility])  CULTURE, BLOOD (ROUTINE  X 2)  CULTURE, BLOOD (ROUTINE X 2)  LACTIC ACID, PLASMA  LACTIC ACID, PLASMA  CBC WITH DIFFERENTIAL/PLATELET  PROCALCITONIN  LACTATE DEHYDROGENASE  FERRITIN  TRIGLYCERIDES    EKG EKG Interpretation  Date/Time:  Tuesday April 26 2019 09:59:44 EDT Ventricular Rate:  108 PR Interval:    QRS Duration: 76 QT Interval:  337 QTC Calculation: 452 R Axis:   84 Text Interpretation:  Age not entered, assumed to be  85 years old for purpose of ECG interpretation Sinus tachycardia Probable left atrial enlargement Anteroseptal infarct, age indeterminate Since last tracing rate faster Confirmed by Dorie Rank [phone]) on 04/26/2019 11:05:35 AM   Radiology Dg Chest Port 1 View  Result Date: 04/26/2019 CLINICAL DATA:  Shortness of breath, cough EXAM: PORTABLE CHEST 1 VIEW COMPARISON:  None. FINDINGS: Heart and mediastinal contours are within normal limits. No focal opacities or effusions. No acute bony abnormality. IMPRESSION: No active disease. Electronically Signed   By: Rolm Baptise M.D.   On: 04/26/2019 10:14    Procedures Procedures (including critical care time)  Medications Ordered in ED Medications  sodium chloride (PF) 0.9 % injection (has no administration in time range)  iohexol (OMNIPAQUE) 350 MG/ML injection 100 mL (68 mLs Intravenous Contrast Given 04/26/19 1403)     Initial Impression / Assessment and Plan / ED Course  I have reviewed the triage vital signs and the nursing notes.  Pertinent labs & imaging results that were available during my care of the patient were reviewed by me and considered in my medical decision making (see chart for  details).  Clinical Course as of Apr 25 1432  Tue Apr 26, 2019  1126 Chest x-ray without acute findings.  D-dimer is elevated as is CRP and fibrinogen level.  Symptoms may be associated with covid.  I will wait on that result.  If negative patient will require CT scan   [JK]  1430 CT Angio is positive.  RV LV is elevated.  Possible right heart strain.     [JK]    Clinical Course User Index [JK] Dorie Rank, MD     Patient presented to the emergency room for evaluation of shortness of breath.  Patient was noted to be hypoxic in his doctor's office.  In the ED he is afebrile but was tachycardic and tachypneic.  He is requiring oxygen.  Patient CT scan demonstrates a pulmonary embolism.  Patient has remained hemodynamically stable with normal blood pressure and pulse rate.  Heparin infusion has been ordered.  I will consult with the medical service for admission. Final Clinical Impressions(s) / ED Diagnoses   Final diagnoses:  Acute pulmonary embolism without acute cor pulmonale, unspecified pulmonary embolism type ([place])  Hyperglycemia     Dorie Rank, MD 04/26/19 1433

## 2019-04-26 NOTE — Progress Notes (Signed)
Subjective:    Patient ID: Ryan Gilmore, male    DOB: 30-Aug-1933, 85 y.o.   MRN: [id number]  HPI  03/14/19 I recently saw the patient May 21 for his back.  At that time we also check lab work to evaluate his diabetes.  His hemoglobin A1c was found to be elevated at greater than 8.  He was also found to be in acute renal insufficiency with a creatinine of 1.5.  I had recommended starting the patient on insulin.  He was already taking hydrocodone for his back.  Patient never started insulin.  Unfortunately he had to go to the emergency room recently due to abdominal discomfort nausea and vomiting.  The patient and his wife report very little appetite.  The patient's history is complicated by his moderate dementia.  He is able to provide very little history.  However his wife states that he has very little appetite.  He eats a few bites and that he is full quickly demonstrating early satiety.  He is also had several episodes of nausea and vomiting.  However she is unaware of any diarrhea.  She is unaware if he is going to the bathroom to have bowel movements.  They went to the emergency room.  There an abdominal x-ray revealed moderate stool burden throughout the colon.  On rectal exam today, the patient has a large amount of firm stool in the rectal vault although he is not impacted.  I suspect that the patient has moderate to severe constipation brought on by narcotics for chronic back pain and this could be causing his early satiety and nausea and vomiting.  However he is also on Victoza and he is previously had similar symptoms with Ozempic in the past.  The third possibility would be gastroparesis due to his longstanding poorly controlled diabetes.  At that time, my plan was: Differential diagnosis includes severe constipation versus gastroparesis versus nausea and vomiting due to Victoza.  Recommended discontinuation of Victoza and starting Linzess 290 mcg daily for constipation.  Also recommended using a  fleets enema daily to help soften the stool in the rectal vault.  Reassess the patient in 1 week.  If symptoms are no better, institute work-up for gastroparesis and consider trying Reglan.  Next week if the patient symptoms are better, we will need to discuss switching the patient to insulin for glycemic control after we discontinue Victoza.  Spent more than 30 minutes today with the patient and his family explaining the differential diagnosis and the work-up.  03/21/19 The patient's wife contacted me June 11 stating that he had not had a bowel movement after trying the Linzess and the fleets enema for 3 days.  We recommended adding Dulcolax.  However, she took him to the emergency room.  There he was given an enema and had a bowel movement Thursday evening.  Saturday and Sunday his appetite has improved.  This morning his appetite has improved.  He is actually gained 2 pounds since last week.  Unfortunately he still not eating very much up until the last 48 hours.  Of course his appetite did seem to improve after he had a large bowel movement on Saturday.  There is continuing to use Linzess but they are not seeing regular bowel movements.  They have avoided giving him pain medication last week.  They are also not giving him Victoza.  However his wife is not checking his blood sugars.  Patient appears frail and cachectic.  His memory loss  is obviously worsening.  He denies any nausea.  He denies any vomiting.  He denies any abdominal pain.  However his wife asked him if his back hurting and he says yes.  I am not sure how much is truly pain in his back and how much she is simply saying yes to her questions out of confusion but I am concerned that this may lead to over use of narcotics contributing to his constipation.  His appetite has also been in steady decline to this point suggesting likely a component of his dementia as well.  Since I last saw him he has developed a persistent cough.  He coughs every 10 to  15 seconds in the exam room today.  His lungs are clear to auscultation and he is not in any respiratory distress however the cough is persistent and hacking.  They deny any fevers.  At that time, my plan was: I had a long discussion today with the patient, his daughter, and his wife.  He appears to continue to be declining.  My gut instinct is that this represents decline in his dementia.  I believe this is likely suppressing his appetite.  This coupled with chronic constipation is likely causing the patient not to eat very much.  I would continue to refrain from using Victoza and I would recommend using a stool softener such as Linzess or MiraLAX coupled with Dulcolax as a stimulant laxative every day.  Recheck later this week to see if his appetite has improved and that hopefully he is having regular bowel movements.  Counseled his wife not to overuse the pain medication.  I do not believe that every time he agrees that his back hurting he is truly in pain.  He appears comfortable today in no distress yet he agrees with her when she asked if his back is hurting.  Patient continues to report feeling just weak and tired.  His prostate was enlarged on his last rectal exam and therefore I will check a PSA to see if there could be an element of an underlying prostate malignancy contributing to his failure to thrive.  We had a long discussion about replacing Victoza with insulin.  [name] dementia prevents him from managing his insulin.  Wife is also demonstrating memory loss and I do not believe she can administer the insulin safely.  Therefore I recommended that if his blood sugars are under 300 would not add insulin and focus more on quality of life rather than quantity.  Both the patient's wife and the patient's daughter agreed to this plan.  They realize the repercussions but they also realize the logistics of administering insulin and right now this is not possible at home with his wife  04/26/19 PSA was 10  and urology consult was recommended.  Presents today hypoxic on room air  (84%)with tachycardia with a heart rate of 113 bpm.    Patient appears clinically dehydrated.  He appears frail and cachectic.  He has an elevated respiratory rate after just walking into the clinic.  Both his daughter and his wife state that he has been coughing at home.  He has been coughing for more than a month but however is gotten worse.  He has no appetite.  He has not been eating.  Even on 3 L via nasal cannula his oxygen saturations remain 88 to 89%.  However his lungs are clear to auscultation.  I appreciate no crackles, no wheezing, no rhonchorous breath sounds.  He denies  any chest pain or pleurisy.  He denies any hemoptysis or purulent sputum.  There is no pitting edema or evidence of fluid overload on clinical exam.  However he is profoundly tachycardic with a heart rate in the 120s.  It appears regular. Past Medical History:  Diagnosis Date   Bleeding duodenal ulcer    ?  1980s   Dementia ([place])    Diabetes mellitus without complication ([place])    Hyperlipidemia    Past Surgical History:  Procedure Laterality Date   APPENDECTOMY     Current Outpatient Medications on File Prior to Visit  Medication Sig Dispense Refill   azithromycin (ZITHROMAX) 250 MG tablet 2 tabs poqday1, 1 tab poqday 2-5 6 tablet 0   Blood Glucose Monitoring Suppl (BLOOD GLUCOSE SYSTEM PAK) KIT Please dispense based on patient and insurance preference. Use as directed to monitor FSBS 2x. Dx: E11.E11.65. 1 kit 1   donepezil (ARICEPT) 5 MG tablet TAKE 1 TABLET BY MOUTH AT BEDTIME 90 tablet 3   glipiZIDE (GLUCOTROL XL) 10 MG 24 hr tablet Take 1 tablet (10 mg total) by mouth daily with breakfast. 90 tablet 1   Glucose Blood (BLOOD GLUCOSE TEST STRIPS) STRP Please dispense one touch verio flex lancets, and testing strips. Use as directed to monitor FSBS 2x. Dx: E11.E11.65. 100 strip 11   Lancets MISC Please dispense based on patient  and insurance preference. Use as directed to monitor FSBS 2x. Dx: E11.E11.65. 100 each 3   linaclotide (LINZESS) 290 MCG CAPS capsule Take 290 mcg by mouth daily before breakfast.     lisinopril (ZESTRIL) 10 MG tablet Take 1 tablet by mouth once daily 90 tablet 0   metFORMIN (GLUCOPHAGE) 500 MG tablet TAKE 2 TABLETS BY MOUTH TWICE DAILY WITH A MEAL 360 tablet 1   pioglitazone (ACTOS) 30 MG tablet TAKE 1 TABLET BY MOUTH ONCE DAILY 90 tablet 1   pravastatin (PRAVACHOL) 40 MG tablet TAKE 1 TABLET BY MOUTH ONCE DAILY 90 tablet 2   No current facility-administered medications on file prior to visit.    No Known Allergies Social History   Socioeconomic History   Marital status: Married    Spouse name: Not on file   Number of children: Not on file   Years of education: Not on file   Highest education level: Not on file  Occupational History   Not on file  Social Needs   Financial resource strain: Not on file   Food insecurity    Worry: Not on file    Inability: Not on file   Transportation needs    Medical: Not on file    Non-medical: Not on file  Tobacco Use   Smoking status: Never Smoker   Smokeless tobacco: Current User    Types: Chew  Substance and Sexual Activity   Alcohol use: Not Currently   Drug use: Never   Sexual activity: Not on file  Lifestyle   Physical activity    Days per week: Not on file    Minutes per session: Not on file   Stress: Not on file  Relationships   Social connections    Talks on phone: Not on file    Gets together: Not on file    Attends religious service: Not on file    Active member of club or organization: Not on file    Attends meetings of clubs or organizations: Not on file    Relationship status: Not on file   Intimate partner violence  Fear of current or ex partner: Not on file    Emotionally abused: Not on file    Physically abused: Not on file    Forced sexual activity: Not on file  Other Topics Concern    Not on file  Social History Narrative   Not on file      Review of Systems  All other systems reviewed and are negative.      Objective:     Physical Exam Vitals signs reviewed.  Cardiovascular:     Rate and Rhythm: Tachycardic and regular rhythm.     Heart sounds: Normal heart sounds.  Pulmonary:     Effort: Pulmonary effort is normal. No respiratory distress. HYPOXIC    Breath sounds: Normal breath sounds. No stridor. No wheezing, rhonchi or rales.  Abdominal:     General: Bowel sounds are normal. There is no distension.     Palpations: Abdomen is soft. There is no mass.     Tenderness: There is no abdominal tenderness.   Appears frail and dehydrated    Assessment & Plan:   1. Hypoxia I suspect the patient has community-acquired pneumonia.  2. Tachycardia I believe the patient is tachycardic secondary to his hypoxia, possible early sepsis from pneumonia, and dehydration.  He appears to require IV fluids as well as IV antibiotics assuming the work-up does reveal pneumonia.  He does not appear stable to go home.  The patient was referred to the emergency room on oxygen.  He does appear stable for private transport.  They will go to the [facility] long emergency room which is the patient's preference.  Also believe he needs COVID testing in addition to a chest x-ray and IV lab work.  Family will take the patient directly to the emergency room and we will notify them of his arrival

## 2019-04-26 NOTE — ED Notes (Signed)
ED TO INPATIENT HANDOFF REPORT  Name/Age/Gender Ryan Gilmore 85 y.o. male  Code Status    Code Status Orders  (From admission, onward)         Start     Ordered   04/26/19 1520  Full code  Continuous     04/26/19 1519        Code Status History    This patient has a current code status but no historical code status.   Advance Care Planning Activity      Home/SNF/Other Home  Chief Complaint shob  Level of Care/Admitting Diagnosis ED Disposition    ED Disposition Condition [facility]: [place] [[id number]]  Level of Care: Stepdown [14]  Admit to SDU based on following criteria: Hemodynamic compromise or significant risk of instability:  Patient requiring short term acute titration and management of vasoactive drips, and invasive monitoring (i.e., CVP and Arterial line).  Covid Evaluation: Confirmed COVID Negative  Diagnosis: Bilateral pulmonary embolism [facility]) [[id number]]  Admitting Physician: Darliss Cheney [[id number]]  Attending Physician: Darliss Cheney [phone]  Estimated length of stay: 3 - 4 days  Certification:: I certify this patient will need inpatient services for at least 2 midnights  PT Class (Do Not Modify): Inpatient [101]  PT Acc Code (Do Not Modify): Private [1]       Medical History Past Medical History:  Diagnosis Date  . Bleeding duodenal ulcer    ?  1980s  . Dementia ([place])   . Diabetes mellitus without complication ([place])   . Hyperlipidemia     Allergies No Known Allergies  IV Location/Drains/Wounds Patient Lines/Drains/Airways Status   Active Line/Drains/Airways    Name:   Placement date:   Placement time:   Site:   Days:   Peripheral IV 04/26/19 Right;Distal;Posterior Forearm   04/26/19    0958    Forearm   less than 1   Peripheral IV 04/26/19 Left Antecubital   04/26/19    1010    Antecubital   less than 1          Labs/Imaging Results for orders placed or performed during the hospital  encounter of 04/26/19 (from the past 48 hour(s))  SARS Coronavirus 2 [facility] order, Performed in [place] hospital lab)     Status: None   Collection Time: 04/26/19 10:00 AM   Specimen: Nasopharyngeal Swab  Result Value Ref Range   SARS Coronavirus 2 NEGATIVE NEGATIVE    Comment: (NOTE) If result is NEGATIVE SARS-CoV-2 target nucleic acids are NOT DETECTED. The SARS-CoV-2 RNA is generally detectable in upper and lower  respiratory specimens during the acute phase of infection. The lowest  concentration of SARS-CoV-2 viral copies this assay can detect is 250  copies / mL. A negative result does not preclude SARS-CoV-2 infection  and should not be used as the sole basis for treatment or other  patient management decisions.  A negative result may occur with  improper specimen collection / handling, submission of specimen other  than nasopharyngeal swab, presence of viral mutation(s) within the  areas targeted by this assay, and inadequate number of viral copies  (<250 copies / mL). A negative result must be combined with clinical  observations, patient history, and epidemiological information. If result is POSITIVE SARS-CoV-2 target nucleic acids are DETECTED. The SARS-CoV-2 RNA is generally detectable in upper and lower  respiratory specimens dur ing the acute phase of infection.  Positive  results are indicative of active infection with  SARS-CoV-2.  Clinical  correlation with patient history and other diagnostic information is  necessary to determine patient infection status.  Positive results do  not rule out bacterial infection or co-infection with other viruses. If result is PRESUMPTIVE POSTIVE SARS-CoV-2 nucleic acids MAY BE PRESENT.   A presumptive positive result was obtained on the submitted specimen  and confirmed on repeat testing.  While 2019 novel coronavirus  (SARS-CoV-2) nucleic acids may be present in the submitted sample  additional confirmatory testing may be  necessary for epidemiological  and / or clinical management purposes  to differentiate between  SARS-CoV-2 and other Sarbecovirus currently known to infect humans.  If clinically indicated additional testing with an alternate test  methodology [phone]) is advised. The SARS-CoV-2 RNA is generally  detectable in upper and lower respiratory sp ecimens during the acute  phase of infection. The expected result is Negative. Fact Sheet for Patients:  StrictlyIdeas.no Fact Sheet for Healthcare Providers: BankingDealers.co.za This test is not yet approved or cleared by the [place] FDA and has been authorized for detection and/or diagnosis of SARS-CoV-2 by FDA under an Emergency Use Authorization (EUA).  This EUA will remain in effect (meaning this test can be used) for the duration of the COVID-19 declaration under Section 564(b)(1) of the Act, 21 U.S.C. section 360bbb-3(b)(1), unless the authorization is terminated or revoked sooner. Performed at [facility], [place] [address] [place], [name] [address]   Lactic acid, plasma     Status: None   Collection Time: 04/26/19 10:00 AM  Result Value Ref Range   Lactic Acid, Venous 1.9 0.5 - 1.9 mmol/L    Comment: Performed at [facility], [place] [address], [place], [place] [address]  CBC WITH DIFFERENTIAL     Status: None   Collection Time: 04/26/19 10:00 AM  Result Value Ref Range   WBC 10.4 4.0 - 10.5 K/uL   RBC 4.65 4.22 - 5.81 MIL/uL   Hemoglobin 14.2 13.0 - 17.0 g/dL   HCT 42.8 39.0 - 52.0 %   MCV 92.0 80.0 - 100.0 fL   MCH 30.5 26.0 - 34.0 pg   MCHC 33.2 30.0 - 36.0 g/dL   RDW 13.8 11.5 - 15.5 %   Platelets 181 150 - 400 K/uL   nRBC 0.0 0.0 - 0.2 %   Neutrophils Relative % 70 %   Neutro Abs 7.2 1.7 - 7.7 K/uL   Lymphocytes Relative 24 %   Lymphs Abs 2.5 0.7 - 4.0 K/uL   Monocytes Relative 6 %   Monocytes Absolute 0.7 0.1 - 1.0 K/uL    Eosinophils Relative 0 %   Eosinophils Absolute 0.0 0.0 - 0.5 K/uL   Basophils Relative 0 %   Basophils Absolute 0.0 0.0 - 0.1 K/uL   Immature Granulocytes 0 %   Abs Immature Granulocytes 0.03 0.00 - 0.07 K/uL    Comment: Performed at [facility], [place] [address] [place], [place] [address]  Comprehensive metabolic panel     Status: Abnormal   Collection Time: 04/26/19 10:00 AM  Result Value Ref Range   Sodium 136 135 - 145 mmol/L   Potassium 4.6 3.5 - 5.1 mmol/L   Chloride 103 98 - 111 mmol/L   CO2 21 (L) 22 - 32 mmol/L   Glucose, Bld 321 (H) 70 - 99 mg/dL   BUN 16 8 - 23 mg/dL   Creatinine, Ser 1.25 (H) 0.61 - 1.24 mg/dL   Calcium 9.2 8.9 - 10.3 mg/dL  Total Protein 7.2 6.5 - 8.1 g/dL   Albumin 4.2 3.5 - 5.0 g/dL   AST 15 15 - 41 U/L   ALT 12 0 - 44 U/L   Alkaline Phosphatase 55 38 - 126 U/L   Total Bilirubin 0.8 0.3 - 1.2 mg/dL   GFR calc non Af Amer 53 (L) >60 mL/min   GFR calc Af Amer >60 >60 mL/min   Anion gap 12 5 - 15    Comment: Performed at [facility], [place] [address] [place], [place] [address]  D-dimer, quantitative     Status: Abnormal   Collection Time: 04/26/19 10:00 AM  Result Value Ref Range   D-Dimer, Quant 2.13 (H) 0.00 - 0.50 ug/mL-FEU    Comment: (NOTE) At the manufacturer cut-off of 0.50 ug/mL FEU, this assay has been documented to exclude PE with a sensitivity and negative predictive value of 97 to 99%.  At this time, this assay has not been approved by the FDA to exclude DVT/VTE. Results should be correlated with clinical presentation. Performed at [facility], [place] [address] [place], [place] [address]   Procalcitonin     Status: None   Collection Time: 04/26/19 10:00 AM  Result Value Ref Range   Procalcitonin <0.10 ng/mL    Comment:        Interpretation: PCT (Procalcitonin) <= 0.5 ng/mL: Systemic infection (sepsis) is not likely. Local bacterial infection is possible. (NOTE)        Sepsis PCT Algorithm           Lower Respiratory Tract                                      Infection PCT Algorithm    ----------------------------     ----------------------------         PCT < 0.25 ng/mL                PCT < 0.10 ng/mL         Strongly encourage             Strongly discourage   discontinuation of antibiotics    initiation of antibiotics    ----------------------------     -----------------------------       PCT 0.25 - 0.50 ng/mL            PCT 0.10 - 0.25 ng/mL               OR       >80% decrease in PCT            Discourage initiation of                                            antibiotics      Encourage discontinuation           of antibiotics    ----------------------------     -----------------------------         PCT >= 0.50 ng/mL              PCT 0.26 - 0.50 ng/mL               AND        <80% decrease in PCT  Encourage initiation of                                             antibiotics       Encourage continuation           of antibiotics    ----------------------------     -----------------------------        PCT >= 0.50 ng/mL                  PCT > 0.50 ng/mL               AND         increase in PCT                  Strongly encourage                                      initiation of antibiotics    Strongly encourage escalation           of antibiotics                                     -----------------------------                                           PCT <= 0.25 ng/mL                                                 OR                                        > 80% decrease in PCT                                     Discontinue / Do not initiate                                             antibiotics Performed at [place] [address], [place], [name] [address]   Lactate dehydrogenase     Status: None   Collection Time: 04/26/19 10:00 AM  Result Value Ref Range   LDH 126 98 - 192 U/L    Comment:  Performed at [facility], [place] [address] [place], [name] [address]  Ferritin     Status: None   Collection Time: 04/26/19 10:00 AM  Result Value Ref Range   Ferritin 211 24 - 336 ng/mL    Comment: Performed at [facility], [place] [address] [place],  [address]  Triglycerides     Status: None   Collection Time: 04/26/19 10:00 AM  Result Value Ref Range  Triglycerides 70 <150 mg/dL    Comment: Performed at [facility], [place] [address] [place], [place] [address]  Fibrinogen     Status: Abnormal   Collection Time: 04/26/19 10:00 AM  Result Value Ref Range   Fibrinogen 560 (H) 210 - 475 mg/dL    Comment: Performed at [facility], [place] [address] [place], [place] [address]  C-reactive protein     Status: Abnormal   Collection Time: 04/26/19 10:00 AM  Result Value Ref Range   CRP 10.8 (H) <1.0 mg/dL    Comment: Performed at [facility], [place] [address], [place], [place] [address]  Brain natriuretic peptide     Status: Abnormal   Collection Time: 04/26/19 10:05 AM  Result Value Ref Range   B Natriuretic Peptide 474.4 (H) 0.0 - 100.0 pg/mL    Comment: Performed at [facility], [place] [address], [place], [name] [address]  Lactic acid, plasma     Status: None   Collection Time: 04/26/19 12:16 PM  Result Value Ref Range   Lactic Acid, Venous 1.6 0.5 - 1.9 mmol/L    Comment: Performed at [facility], [place] [address], [place], [name] [address]   Ct Angio Chest Pe W And/or Wo Contrast  Result Date: 04/26/2019 CLINICAL DATA:  PE suspected, shortness of breath, dry cough EXAM: CT ANGIOGRAPHY CHEST WITH CONTRAST TECHNIQUE: Multidetector CT imaging of the chest was performed using the standard protocol during bolus administration of intravenous contrast. Multiplanar CT image reconstructions and MIPs were obtained to evaluate the vascular anatomy. CONTRAST:   72mL OMNIPAQUE IOHEXOL 350 MG/ML SOLN COMPARISON:  None. FINDINGS: Cardiovascular: Satisfactory opacification of the pulmonary arteries to the segmental level. Positive examination for pulmonary embolism with extensive bilateral lobar to segmental embolus in all lobes. The RV LV ratio is elevated at approximately 1.6:1. Normal heart size. No pericardial effusion. Mediastinum/Nodes: No enlarged mediastinal, hilar, or axillary lymph nodes. Thyroid gland, trachea, and esophagus demonstrate no significant findings. Lungs/Pleura: Evaluation of the lung parenchyma is limited by breath motion artifact. Scattered ground-glass opacities, for example in the apical left upper lobe (series 11, image 35). No pleural effusion or pneumothorax. Upper Abdomen: No acute abnormality. Musculoskeletal: No chest wall abnormality. No acute or significant osseous findings. Review of the MIP images confirms the above findings. IMPRESSION: 1. Positive examination for pulmonary embolism with extensive bilateral lobar to segmental embolus in all lobes. 2. The RV LV ratio is elevated at approximately 1.6:1, concerning for right heart strain. Correlate with echocardiographic findings. 3. Scattered ground-glass opacities, for example in the apical left upper lobe (series 11, image 35). These are nonspecific and likely infectious or inflammatory and do not particularly appear to be in a distribution distal to embolus or in a configuration suggesting developing pulmonary infarction. These results were called by telephone at the time of interpretation on 04/26/2019 at 2:31 pm to Dr. Dorie Rank , who verbally acknowledged these results. Electronically Signed   By: Kendry Candle M.D.   On: 04/26/2019 14:31   Dg Chest Port 1 View  Result Date: 04/26/2019 CLINICAL DATA:  Shortness of breath, cough EXAM: PORTABLE CHEST 1 VIEW COMPARISON:  None. FINDINGS: Heart and mediastinal contours are within normal limits. No focal opacities or effusions. No acute  bony abnormality. IMPRESSION: No active disease. Electronically Signed   By: Rolm Baptise M.D.   On: 04/26/2019 10:14    Pending Labs [employer] (From admission, onward)    Start  Ordered   04/27/19 0500  CBC  Daily,   R     04/26/19 1449   04/26/19 2100  Heparin level (unfractionated)  Once-Timed,   STAT     04/26/19 1449   04/26/19 1520  TSH  Once,   STAT     04/26/19 1519   04/26/19 0929  Blood Culture (routine x 2)  BLOOD CULTURE X 2,   STAT     04/26/19 0930   Signed and Held  [employer]  Daily,   R     Signed and Held   Signed and Held  Magnesium  Once,   R     Signed and Held   Signed and Held  Comprehensive metabolic panel  Tomorrow morning,   R     Signed and Held   Signed and Held  CBC  Tomorrow morning,   R     Signed and Held          Vitals/Pain Today's Vitals   04/26/19 1200 04/26/19 1300 04/26/19 1500 04/26/19 1530  BP: 113/77 106/78 111/76   Pulse: 99 98 (!) 102 100  Resp: (!) 27 (!) 26 20 13   Temp:      TempSrc:      SpO2: 93% 96% 97% 95%    Isolation Precautions Airborne and Contact precautions  Medications Medications  heparin ADULT infusion 100 units/mL (25000 units/254mL sodium chloride 0.45%) (1,200 Units/hr Intravenous New Bag/Given 04/26/19 1515)  iohexol (OMNIPAQUE) 350 MG/ML injection 100 mL (68 mLs Intravenous Contrast Given 04/26/19 1403)  sodium chloride (PF) 0.9 % injection (  Given by Other 04/26/19 1436)  heparin bolus via infusion 2,500 Units (2,500 Units Intravenous Bolus from Bag 04/26/19 1516)    Mobility walks with person assist

## 2019-04-26 NOTE — ED Triage Notes (Signed)
Pt POV c/o SHOB.  In Triage pt was 83% on RA.  Placed on 3L O2 by [place]- 94%.    Pt with dry cough. Pt states "my wife talked to the doctor, she knows whats going on."  Pt unable to describe sx aside from saying "Im sick."  When asked to be more detailed, pt replies "well, I dont know."

## 2019-04-26 NOTE — Progress Notes (Addendum)
Will need dose amt for benefits check. Will send once received. Unit TOC CM will continue to follow for dc needs.  Jonnie Finner RN CCM Case Mgmt phone [phone]

## 2019-04-27 ENCOUNTER — Inpatient Hospital Stay (HOSPITAL_COMMUNITY): Payer: Medicare HMO

## 2019-04-27 DIAGNOSIS — I361 Nonrheumatic tricuspid (valve) insufficiency: Secondary | ICD-10-CM

## 2019-04-27 DIAGNOSIS — F0390 Unspecified dementia without behavioral disturbance: Secondary | ICD-10-CM | POA: Diagnosis not present

## 2019-04-27 DIAGNOSIS — E1122 Type 2 diabetes mellitus with diabetic chronic kidney disease: Secondary | ICD-10-CM | POA: Diagnosis not present

## 2019-04-27 DIAGNOSIS — E1165 Type 2 diabetes mellitus with hyperglycemia: Secondary | ICD-10-CM | POA: Diagnosis not present

## 2019-04-27 DIAGNOSIS — I2699 Other pulmonary embolism without acute cor pulmonale: Principal | ICD-10-CM

## 2019-04-27 DIAGNOSIS — R627 Adult failure to thrive: Secondary | ICD-10-CM

## 2019-04-27 DIAGNOSIS — F0150 Vascular dementia without behavioral disturbance: Secondary | ICD-10-CM

## 2019-04-27 DIAGNOSIS — J9621 Acute and chronic respiratory failure with hypoxia: Secondary | ICD-10-CM

## 2019-04-27 LAB — COMPREHENSIVE METABOLIC PANEL
ALT: 10 U/L (ref 0–44)
AST: 13 U/L — ABNORMAL LOW (ref 15–41)
Albumin: 3.7 g/dL (ref 3.5–5.0)
Alkaline Phosphatase: 48 U/L (ref 38–126)
Anion gap: 9 (ref 5–15)
BUN: 12 mg/dL (ref 8–23)
CO2: 24 mmol/L (ref 22–32)
Calcium: 8.9 mg/dL (ref 8.9–10.3)
Chloride: 104 mmol/L (ref 98–111)
Creatinine, Ser: 1.06 mg/dL (ref 0.61–1.24)
GFR calc Af Amer: >60 mL/min (ref >60)
GFR calc non Af Amer: >60 mL/min (ref >60)
Glucose, Bld: 246 mg/dL — ABNORMAL HIGH (ref 70–99)
Potassium: 4.4 mmol/L (ref 3.5–5.1)
Sodium: 137 mmol/L (ref 135–145)
Total Bilirubin: 0.7 mg/dL (ref 0.3–1.2)
Total Protein: 6.5 g/dL (ref 6.5–8.1)

## 2019-04-27 LAB — CBC
HCT: 38.8 % — ABNORMAL LOW (ref 39.0–52.0)
Hemoglobin: 12.8 g/dL — ABNORMAL LOW (ref 13.0–17.0)
MCH: 30.4 pg (ref 26.0–34.0)
MCHC: 33 g/dL (ref 30.0–36.0)
MCV: 92.2 fL (ref 80.0–100.0)
Platelets: 170 10*3/uL (ref 150–400)
RBC: 4.21 MIL/uL — ABNORMAL LOW (ref 4.22–5.81)
RDW: 13.6 % (ref 11.5–15.5)
WBC: 11.6 10*3/uL — ABNORMAL HIGH (ref 4.0–10.5)
nRBC: 0 % (ref 0.0–0.2)

## 2019-04-27 LAB — GLUCOSE, CAPILLARY
Glucose-Capillary: 185 mg/dL — ABNORMAL HIGH (ref 70–99)
Glucose-Capillary: 230 mg/dL — ABNORMAL HIGH (ref 70–99)
Glucose-Capillary: 246 mg/dL — ABNORMAL HIGH (ref 70–99)
Glucose-Capillary: 171 mg/dL — ABNORMAL HIGH (ref 70–99)

## 2019-04-27 LAB — HEPARIN LEVEL (UNFRACTIONATED): Heparin Unfractionated: 0.39 IU/mL (ref 0.30–0.70)

## 2019-04-27 LAB — PROTIME-INR
INR: 1.2 (ref 0.8–1.2)
Prothrombin Time: 14.7 seconds (ref 11.4–15.2)

## 2019-04-27 MED ORDER — HEPARIN (PORCINE) 25000 UT/250ML-% IV SOLN
1350.0000 [IU]/h | INTRAVENOUS | Status: DC
  Administered 2019-04-28: 1350 [IU]/h via INTRAVENOUS
  Filled 2019-04-27: qty 250

## 2019-04-27 MED ORDER — LORAZEPAM 2 MG/ML IJ SOLN: 1.0000 mg | Freq: Once | INTRAMUSCULAR | Status: DC

## 2019-04-27 NOTE — Progress Notes (Signed)
ANTICOAGULATION CONSULT NOTE   Pharmacy Consult for Heparin Indication: pulmonary embolus  No Known Allergies  Patient Measurements: Height: 5' 11"  (180.3 cm) Weight: 164 lb 3.9 oz (74.5 kg) IBW/kg (Calculated) : 75.3 Heparin Dosing Weight: 739 kg  Vital Signs: BP: 130/81 (07/22 0604) Pulse Rate: 95 (07/22 0604)  Labs: Recent Labs    04/26/19 1000 04/26/19 2046 04/27/19 0524  HGB 14.2  --  12.8*  HCT 42.8  --  38.8*  PLT 181  --  170  LABPROT  --   --  14.7  INR  --   --  1.2  HEPARINUNFRC  --  0.32 0.39  CREATININE 1.25*  --  1.06    Estimated Creatinine Clearance: 55.6 mL/min (by C-G formula based on SCr of 1.06 mg/dL).  Medications Prior to Admission  Medication Sig Dispense Refill  . donepezil (ARICEPT) 5 MG tablet TAKE 1 TABLET BY MOUTH AT BEDTIME (Patient taking differently: Take 5 mg by mouth at bedtime. ) 90 tablet 3  . glipiZIDE (GLUCOTROL XL) 10 MG 24 hr tablet Take 1 tablet (10 mg total) by mouth daily with breakfast. 90 tablet 1  . linaclotide (LINZESS) 290 MCG CAPS capsule Take 290 mcg by mouth daily before breakfast.    . lisinopril (ZESTRIL) 10 MG tablet Take 1 tablet by mouth once daily (Patient taking differently: Take 10 mg by mouth daily. ) 90 tablet 0  . metFORMIN (GLUCOPHAGE) 500 MG tablet TAKE 2 TABLETS BY MOUTH TWICE DAILY WITH A MEAL (Patient taking differently: Take 1,000 mg by mouth 2 (two) times daily with a meal. ) 360 tablet 1  . pioglitazone (ACTOS) 30 MG tablet TAKE 1 TABLET BY MOUTH ONCE DAILY (Patient taking differently: Take 30 mg by mouth daily. ) 90 tablet 1  . pravastatin (PRAVACHOL) 40 MG tablet TAKE 1 TABLET BY MOUTH ONCE DAILY (Patient taking differently: Take 40 mg by mouth daily. ) 90 tablet 2  . Blood Glucose Monitoring Suppl (BLOOD GLUCOSE SYSTEM PAK) KIT Please dispense based on patient and insurance preference. Use as directed to monitor FSBS 2x. Dx: E11.E11.65. 1 kit 1  . Glucose Blood (BLOOD GLUCOSE TEST STRIPS) STRP Please  dispense one touch verio flex lancets, and testing strips. Use as directed to monitor FSBS 2x. Dx: C48.G89.16. 100 strip 11  . Lancets MISC Please dispense based on patient and insurance preference. Use as directed to monitor FSBS 2x. Dx: E11.E11.65. 100 each 3    Assessment: 82 yoM with PMH HTN, HLD, dementia, DM2 sent from PCPs office for hypoxia. D-dimer and BNP elevated; found to have bilateral PE with RH strain on CT. Pharmacy to dose heparin.   Baseline INR WNL, aPTT not done  Prior anticoagulation: none  Significant events:  Today, 04/27/2019:  CBC: Hgb slightly low, likely dilutional; Plt stable WNL  Confirmatory heparin level therapeutic on 1300 units/hr  No bleeding or infusion issues per nursing  Goal of Therapy: Heparin level 0.3-0.7 units/ml Monitor platelets by anticoagulation protocol: Yes  Plan:  Will adjust heparin up slightly to 1350 units/hr (would like to keep on higher end of range with significant clot burden and RH strain)  Daily CBC and heparin level  Monitor for signs of bleeding or thrombosis  Plan is for IV heparin x 48 hr followed by transition to [place] or warfarin depending on cost; case management notified.   Reuel Boom, PharmD, BCPS [phone] 04/27/2019, 10:55 AM

## 2019-04-27 NOTE — Progress Notes (Signed)
Inpatient Diabetes Program Recommendations  AACE/ADA: New Consensus Statement on Inpatient Glycemic Control (2015)  Target Ranges:  Prepandial:   less than 140 mg/dL      Peak postprandial:   less than 180 mg/dL (1-2 hours)      Critically ill patients:  140 - 180 mg/dL   Results for DERMOT, GREMILLION (MRN [id number]) as of 04/27/2019 12:42  Ref. Range 04/26/2019 16:42 04/26/2019 21:56 04/27/2019 08:39 04/27/2019 12:10  Glucose-Capillary Latest Ref Range: 70 - 99 mg/dL 233 (H) 185 (H) 230 (H) 246 (H)    Admit Bilateral pulmonary embolism with right heart strain  History: DM, Dementia   Home DM Meds: Glipizide 10 mg Daily + Metformin 1000 mg BID + Actos 30 mg Daily  Current Orders: Novolog Moderate Correction Scale/ SSI (0-15 units) TID AC + HS     PCP: Dr. Dennard Schaumann w/ [facility]--Per notes from June 15, Victoza was stopped back in early June b/c pt was not eating well--MD talked w/ pt's dtr about possible need for insulin but pt and his wife likely can't handle insulin injections--Discussion with dtr about just keeping CBGs under 300 and not trying to control CBGs too tightly--Dtr and PCP in agreement.       MD- Note CBGs elevated in hospital setting.  Novolog SSI started last PM.  May consider the following if pt continues to have elevated CBGs >200 in the hospital setting:  Start low dose basal insulin: Lantus 7 units QHS (0.1 units/kg dosing)     --Will follow patient during hospitalization--  Wyn Quaker RN, MSN, CDE Diabetes Coordinator Inpatient Glycemic Control Team Team Pager: [phone] (8a-5p)

## 2019-04-27 NOTE — Progress Notes (Signed)
PROGRESS NOTE  MYRTLE BARNHARD NAT:[id number] DOB: [birth year] DOA: 04/26/2019 PCP: [name], MD  HPI/Recap of past 24 hours:  He wants to go home, denies pain He is demented, could not remember why he is in the hospital  Assessment/Plan: Active Problems:   Type 2 diabetes mellitus without complication ([place])   Bilateral pulmonary embolism ([place])   Essential hypertension   AKI (acute kidney injury) ([place])  Acute bilateral PE/ right heart strain /acute hypoxic respiratory failure -per daughter patient has been very sedentary recently -critical care input appreciated, currently no plan for thrombolysis -plan for heparin dripx48hrs, then transition to DOAC , echo/nenous doppler pending -wean o2 as tolerated  AKI on CKD II Cr 1.25 on presentation Appear dehydrated, he received hydration, Cr improved , today is 1.06  HTN: Home meds lisinopril held for now due to acute PE and AKI  noninsulin dependent dm2 a1c in 02/2019 was 8 Am blood glucose is 246 On ssi here Home meds metformin/glipizide/actos held On statin   Elevated PSA Case discussed with urology on call Dr Matilde Sprang per daughter's request. Per [name], patient needs to reschedule urology appoint till 3-4 weeks out from acute PE  FTT/ progressive dementia/significant weight loss -per daughter patient has been very sedentary, mostly in bed or in chair, most of the days -Will get PT eval -Daughter is open to [facility] or SNF in needed -Palliative care consulted for goals of care   Code Status: full  Family Communication: patient in room, daughter over the phone  Disposition Plan: not ready to discharge, need to stay on heparin drip for 48hrs, needs wean 02, needs PT   Consultants:  Critical care  Palliative care  Procedures:  none  Antibiotics:  none   Objective: BP (!) 151/70 (BP Location: Right Arm)   Pulse (!) 104   Temp (!) 97.4 F (36.3 C) (Oral)   Resp (!) 21   Ht 5\' 11"  (1.803 m)   Wt  74.5 kg   SpO2 93%   BMI 22.91 kg/m   Intake/Output Summary (Last 24 hours) at 04/27/2019 1633 Last data filed at 04/27/2019 1400 Gross per 24 hour  Intake 1930.33 ml  Output 500 ml  Net 1430.33 ml   Filed Weights   04/26/19 1650  Weight: 74.5 kg    Exam: Patient is examined daily including today on 04/27/2019, exams remain the same as of yesterday except that has changed    General:  NAD, confused, not oriented to time, he thinks he is in [place]  Cardiovascular: tachycardia  Respiratory: CTABL  Abdomen: Soft/ND/NT, positive BS  Musculoskeletal: No Edema  Neuro: alert, oriented to self only  Data Reviewed: Basic Metabolic Panel: Recent Labs  Lab 04/26/19 1000 04/26/19 2046 04/27/19 0524  NA 136  --  137  K 4.6  --  4.4  CL 103  --  104  CO2 21*  --  24  GLUCOSE 321*  --  246*  BUN 16  --  12  CREATININE 1.25*  --  1.06  CALCIUM 9.2  --  8.9  MG  --  1.8  --    Liver Function Tests: Recent Labs  Lab 04/26/19 1000 04/27/19 0524  AST 15 13*  ALT 12 10  ALKPHOS 55 48  BILITOT 0.8 0.7  PROT 7.2 6.5  ALBUMIN 4.2 3.7   No results for input(s): LIPASE, AMYLASE in the last 168 hours. No results for input(s): AMMONIA in the last 168 hours. CBC: Recent  Labs  Lab 04/26/19 1000 04/27/19 0524  WBC 10.4 11.6*  NEUTROABS 7.2  --   HGB 14.2 12.8*  HCT 42.8 38.8*  MCV 92.0 92.2  PLT 181 170   Cardiac Enzymes:   No results for input(s): CKTOTAL, CKMB, CKMBINDEX, TROPONINI in the last 168 hours. BNP (last 3 results) Recent Labs    04/26/19 1005  BNP 474.4*    ProBNP (last 3 results) No results for input(s): PROBNP in the last 8760 hours.  CBG: Recent Labs  Lab 04/26/19 1642 04/26/19 2156 04/27/19 0839 04/27/19 1210  GLUCAP 233* 185* 230* 246*    Recent Results (from the past 240 hour(s))  SARS Coronavirus 2 [facility] order, Performed in [place] hospital lab)     Status: None   Collection Time: 04/26/19 10:00 AM   Specimen:  Nasopharyngeal Swab  Result Value Ref Range Status   SARS Coronavirus 2 NEGATIVE NEGATIVE Final    Comment: (NOTE) If result is NEGATIVE SARS-CoV-2 target nucleic acids are NOT DETECTED. The SARS-CoV-2 RNA is generally detectable in upper and lower  respiratory specimens during the acute phase of infection. The lowest  concentration of SARS-CoV-2 viral copies this assay can detect is 250  copies / mL. A negative result does not preclude SARS-CoV-2 infection  and should not be used as the sole basis for treatment or other  patient management decisions.  A negative result may occur with  improper specimen collection / handling, submission of specimen other  than nasopharyngeal swab, presence of viral mutation(s) within the  areas targeted by this assay, and inadequate number of viral copies  (<250 copies / mL). A negative result must be combined with clinical  observations, patient history, and epidemiological information. If result is POSITIVE SARS-CoV-2 target nucleic acids are DETECTED. The SARS-CoV-2 RNA is generally detectable in upper and lower  respiratory specimens dur ing the acute phase of infection.  Positive  results are indicative of active infection with SARS-CoV-2.  Clinical  correlation with patient history and other diagnostic information is  necessary to determine patient infection status.  Positive results do  not rule out bacterial infection or co-infection with other viruses. If result is PRESUMPTIVE POSTIVE SARS-CoV-2 nucleic acids MAY BE PRESENT.   A presumptive positive result was obtained on the submitted specimen  and confirmed on repeat testing.  While 2019 novel coronavirus  (SARS-CoV-2) nucleic acids may be present in the submitted sample  additional confirmatory testing may be necessary for epidemiological  and / or clinical management purposes  to differentiate between  SARS-CoV-2 and other Sarbecovirus currently known to infect humans.  If clinically  indicated additional testing with an alternate test  methodology [phone]) is advised. The SARS-CoV-2 RNA is generally  detectable in upper and lower respiratory sp ecimens during the acute  phase of infection. The expected result is Negative. Fact Sheet for Patients:  StrictlyIdeas.no Fact Sheet for Healthcare Providers: BankingDealers.co.za This test is not yet approved or cleared by the [place] FDA and has been authorized for detection and/or diagnosis of SARS-CoV-2 by FDA under an Emergency Use Authorization (EUA).  This EUA will remain in effect (meaning this test can be used) for the duration of the COVID-19 declaration under Section 564(b)(1) of the Act, 21 U.S.C. section 360bbb-3(b)(1), unless the authorization is terminated or revoked sooner. Performed at [facility], [place] [address] [place], [place] [address]   Blood Culture (routine x 2)     Status: None (Preliminary result)   Collection  Time: 04/26/19 10:00 AM   Specimen: BLOOD  Result Value Ref Range Status   Specimen Description   Final    BLOOD BLOOD LEFT FOREARM Performed at [place] [address], [place], [place] [address]    Special Requests   Final    BOTTLES DRAWN AEROBIC AND ANAEROBIC Blood Culture results may not be optimal due to an excessive volume of blood received in culture bottles Performed at [place] [address], [place], [place] [address]    Culture   Final    NO GROWTH 1 DAY Performed at [facility], [place] [address] [place], [place] [address]    Report Status PENDING  Incomplete  Blood Culture (routine x 2)     Status: None (Preliminary result)   Collection Time: 04/26/19 10:05 AM   Specimen: BLOOD  Result Value Ref Range Status   Specimen Description   Final    BLOOD LEFT ANTECUBITAL Performed at [place] [address] [place],  [place] [address]    Special Requests   Final    BOTTLES DRAWN AEROBIC AND ANAEROBIC Blood Culture adequate volume Performed at [place] [address] [place], [place] [address]    Culture   Final    NO GROWTH 1 DAY Performed at [facility], [place] [address], [place], [place] [address]    Report Status PENDING  Incomplete  MRSA PCR Screening     Status: None   Collection Time: 04/26/19  4:34 PM   Specimen: Nasal Mucosa; Nasopharyngeal  Result Value Ref Range Status   MRSA by PCR NEGATIVE NEGATIVE Final    Comment:        The GeneXpert MRSA Assay (FDA approved for NASAL specimens only), is one component of a comprehensive MRSA colonization surveillance program. It is not intended to diagnose MRSA infection nor to guide or monitor treatment for MRSA infections. Performed at [facility], [place] [address] [place], [place] [address]      Studies: No results found.  Scheduled Meds: . Chlorhexidine Gluconate Cloth  6 each Topical Daily  . donepezil  5 mg Oral QHS  . insulin aspart  0-15 Units Subcutaneous TID WC  . insulin aspart  0-5 Units Subcutaneous QHS  . linaclotide  290 mcg Oral QAC breakfast  . mouth rinse  15 mL Mouth Rinse BID  . pravastatin  40 mg Oral Daily    Continuous Infusions: . sodium chloride 50 mL/hr at 04/27/19 1230  . heparin 1,350 Units/hr (04/27/19 1130)     Time spent: 51mins, case discussed with critical care and palliative care I have personally reviewed and interpreted on  04/27/2019 daily labs, tele strips, imagings as discussed above under date review session and assessment and plans.  I reviewed all nursing notes, pharmacy notes, consultant notes,  vitals, pertinent old records  I have discussed plan of care as described above with RN , patient and family on 04/27/2019   Florencia Reasons MD, PhD  [facility] Pager [phone]. If 7PM-7AM, please contact night-coverage at [email or web address], password [facility]  04/27/2019, 4:33 PM  LOS: 1 day

## 2019-04-27 NOTE — Consult Note (Signed)
Consultation Note Date: 04/27/2019   Patient Name: Ryan Gilmore  DOB: 20-May-1933  MRN: [id number]  Age / Sex: 85 y.o., male  PCP: [name], MD Referring Physician: Florencia Reasons, MD  Reason for Consultation: Establishing goals of care and Psychosocial/spiritual support  HPI/Patient Profile: 85 y.o. male  admitted on 04/26/2019 with  medical history significant of hypertension, hyperlipidemia, dementia and type 2 diabetes mellitus, elevated PSA was sent to the emergency department from his PCPs office for further evaluation of hypoxia.    Upon arrival to the emergency department, patient was slightly tachypneic and tachycardic however blood pressure was within normal limits.  He was requiring 3 L of oxygen to keep his oxygen saturation more than 90%.  Initial work-up was directed toward possible COVID-19 infection.   Inflammatory markers were obtained and half of them are elevated.  Subsequently CT angiogram of the chest was done and he was diagnosed with heavy load of bilateral PE with right heart strain.  He was started on heparin and hospital service was consulted to admit the patient for further management.  Patient and family face treatment option decisions, advanced directive decisions and anticipatory care needs.   Clinical Assessment and Goals of Care:  This NP Wadie Lessen reviewed medical records, received report from team, assessed the patient and then meet at the patient's bedside to discuss diagnosis, prognosis, GOC, EOL wishes disposition and options.  Patient was oriented to self but was unaware that he was hospitalized and unable to participate in above conversation.  This nurse practitioner placed phone calls to both wife and daughter and had conversation regarding the above.  [name] daughter/Ryan Gilmore is the main support person and decision maker for this patient and his wife.  The  patient's wife also has cognitive deficits and she defers conversation to her daughter.  Daughter has a clear understanding of the overall situation. Her main questions are in regards to her father's recent dramatic weight loss of approximately 25 pounds in the last few months and its relationship to the elevated PSA and now pulmonary embolus.  She is requesting an inpatient consult with urology, patient does have a urology consult on July 29 with a physician at [facility]. Will discuss with Dr. Kandis Nab education regarding the natural trajectory of dementia.  Concept of Hospice and Palliative Care were discussed  A detailed discussion was had today regarding advanced directives.  Concepts specific to code status, artifical feeding and hydration, continued IV antibiotics and rehospitalization was had.  The difference between a aggressive medical intervention path  and a palliative comfort care path for this patient at this time was had.  Values and goals of care important to patient and family were attempted to be elicited.    Questions and concerns addressed.   Family encouraged to call with questions or concerns.    PMT will continue to support holistically.    NEXT OF KIN    SUMMARY OF RECOMMENDATIONS    Code Status/Advance Care Planning:  Full code  Palliative Prophylaxis:   Aspiration, Bowel Regimen, Delirium Protocol, Frequent Pain Assessment and Oral Care  Additional Recommendations (Limitations, Scope, Preferences):  Full Scope Treatment  Psycho-social/Spiritual:   Desire for further Chaplaincy support:no   Prognosis:   Unable to determine  Discharge Planning: To Be Determined      Primary Diagnoses: Present on Admission: . Bilateral pulmonary embolism ([place])   I have reviewed the medical record, interviewed the patient and family, and examined the patient. The following aspects are pertinent.  Past Medical History:  Diagnosis Date  . Bleeding  duodenal ulcer    ?  1980s  . Dementia ([place])   . Diabetes mellitus without complication ([place])   . Hyperlipidemia    Social History   Socioeconomic History  . Marital status: Married    Spouse name: Not on file  . Number of children: Not on file  . Years of education: Not on file  . Highest education level: Not on file  Occupational History  . Not on file  Social Needs  . Financial resource strain: Not on file  . Food insecurity    Worry: Not on file    Inability: Not on file  . Transportation needs    Medical: Not on file    Non-medical: Not on file  Tobacco Use  . Smoking status: Never Smoker  . Smokeless tobacco: Current User    Types: Chew  Substance and Sexual Activity  . Alcohol use: Not Currently  . Drug use: Never  . Sexual activity: Not on file  Lifestyle  . Physical activity    Days per week: Not on file    Minutes per session: Not on file  . Stress: Not on file  Relationships  . Social Herbalist on phone: Not on file    Gets together: Not on file    Attends religious service: Not on file    Active member of club or organization: Not on file    Attends meetings of clubs or organizations: Not on file    Relationship status: Not on file  Other Topics Concern  . Not on file  Social History Narrative  . Not on file   Family History  Problem Relation Age of Onset  . Heart disease Father   . Diabetes Brother    Scheduled Meds: . Chlorhexidine Gluconate Cloth  6 each Topical Daily  . donepezil  5 mg Oral QHS  . insulin aspart  0-15 Units Subcutaneous TID WC  . insulin aspart  0-5 Units Subcutaneous QHS  . linaclotide  290 mcg Oral QAC breakfast  . mouth rinse  15 mL Mouth Rinse BID  . pravastatin  40 mg Oral Daily   Continuous Infusions: . sodium chloride 50 mL/hr at 04/27/19 1230  . heparin 1,350 Units/hr (04/27/19 1130)   PRN Meds:.acetaminophen **OR** acetaminophen, ondansetron **OR** ondansetron (ZOFRAN) IV Medications Prior to  Admission:  Prior to Admission medications   Medication Sig Start Date End Date Taking? Authorizing Provider  donepezil (ARICEPT) 5 MG tablet TAKE 1 TABLET BY MOUTH AT BEDTIME Patient taking differently: Take 5 mg by mouth at bedtime.  04/04/19  Yes [name], MD  glipiZIDE (GLUCOTROL XL) 10 MG 24 hr tablet Take 1 tablet (10 mg total) by mouth daily with breakfast. 03/23/19  Yes [name], MD  linaclotide (LINZESS) 290 MCG CAPS capsule Take 290 mcg by mouth daily before breakfast.   Yes [provider]  lisinopril (ZESTRIL) 10 MG tablet  Take 1 tablet by mouth once daily Patient taking differently: Take 10 mg by mouth daily.  03/16/19  Yes [name], MD  metFORMIN (GLUCOPHAGE) 500 MG tablet TAKE 2 TABLETS BY MOUTH TWICE DAILY WITH A MEAL Patient taking differently: Take 1,000 mg by mouth 2 (two) times daily with a meal.  03/21/19  Yes Pickard, Cammie [name], MD  pioglitazone (ACTOS) 30 MG tablet TAKE 1 TABLET BY MOUTH ONCE DAILY Patient taking differently: Take 30 mg by mouth daily.  10/05/17  Yes [name], MD  pravastatin (PRAVACHOL) 40 MG tablet TAKE 1 TABLET BY MOUTH ONCE DAILY Patient taking differently: Take 40 mg by mouth daily.  08/30/18  Yes [name], MD  Blood Glucose Monitoring Suppl (BLOOD GLUCOSE SYSTEM PAK) KIT Please dispense based on patient and insurance preference. Use as directed to monitor FSBS 2x. Dx: E11.E11.65. 03/24/19   [name], MD  Glucose Blood (BLOOD GLUCOSE TEST STRIPS) STRP Please dispense one touch verio flex lancets, and testing strips. Use as directed to monitor FSBS 2x. Dx: E11.E11.65. 03/30/19   [name], MD  Lancets MISC Please dispense based on patient and insurance preference. Use as directed to monitor FSBS 2x. Dx: E11.E11.65. 03/24/19   [name], MD   No Known Allergies Review of Systems  Unable to perform ROS: Dementia    Physical Exam Constitutional:      Appearance: He is underweight.  He is ill-appearing.     Comments: Pleasantly confused  Cardiovascular:     Rate and Rhythm: Normal rate and regular rhythm.     Heart sounds: Normal heart sounds.  Skin:    General: Skin is warm and dry.  Neurological:     Mental Status: He is alert.     Vital Signs: BP 130/81   Pulse 95   Temp (!) 97.4 F (36.3 C) (Oral)   Resp (!) 26   Ht 5' 11"  (1.803 m)   Wt 74.5 kg   SpO2 94%   BMI 22.91 kg/m  Pain Scale: 0-10   Pain Score: 0-No pain   SpO2: SpO2: 94 % O2 Device:SpO2: 94 % O2 Flow Rate: .O2 Flow Rate (L/min): 2.5 L/min  IO: Intake/output summary:   Intake/Output Summary (Last 24 hours) at 04/27/2019 1351 Last data filed at 04/27/2019 0300 Gross per 24 hour  Intake 1107.78 ml  Output 200 ml  Net 907.78 ml    LBM: Last BM Date: 04/26/19 Baseline Weight: Weight: 74.5 kg Most recent weight: Weight: 74.5 kg     Palliative Assessment/Data:   Discussed with DrXu  PMT will follow tomorrow  Time In: 1450 Time Out: 1600 Time Total: 70 minutes Greater than 50%  of this time was spent counseling and coordinating care related to the above assessment and plan.  Signed by: Wadie Lessen, NP   Please contact Palliative Medicine Team phone at [phone] for questions and concerns.  For individual provider: See Shea Evans

## 2019-04-27 NOTE — Progress Notes (Signed)
Echocardiogram 2D Echocardiogram has been performed.  Ryan Gilmore 04/27/2019, 1:08 PM

## 2019-04-27 NOTE — Consult Note (Signed)
NAME:  Ryan Gilmore, MRN:  [id number], DOB:  [birth year], LOS: 1 ADMISSION DATE:  04/26/2019, CONSULTATION DATE:  10/22 REFERRING MD:  Erlinda Hong, CHIEF COMPLAINT:  Pulmonary emboli w/ right heart strain    Brief History   85 year old male patient with dementia admitted with submassive pulmonary emboli on 7/21.  Started on IV heparin.  Pulmonary asked to evaluate on 7/22 given CT findings suggesting right heart strain History of present illness   This is an 85 year old male patient with a history of dementia.  Review of PCP notes demonstrate ongoing clinical and physical decline over the last 6 to 8 weeks including weight loss, constipation, poor appetite, as well as cognitive decline.  Presented to his primary care provider For routine visit.  During intake was found to be hypoxic with room air saturations of 80% and therefore was sent to the emergency room for evaluation.  The patient did not have any subjective symptoms leading up to presentation such as shortness of breath chest discomfort fever cough chills.  Primary issue had been poor p.o. intake worsening weakness and weight loss.  In the emergency room he was found to be tachypneic, and tachycardic.  He required 3 L of oxygen to keep his saturations greater than 90%.  CT chest was obtained this demonstrated bilateral pulmonary emboli which was extensive and did not demonstrate right ventricular strain.  Because of this pulmonary has been asked to evaluate with requesting specifically regarding possibility of thrombolytic therapy either catheter directed or systemic.  Past Medical History  Advanced dementia, weight loss, hyperlipidemia, hypertension, diabetes type 2  Significant Hospital Events   7/21 admitted started on IV heparin  Consults:  Pulmonary consult 7/22  Procedures:    Significant Diagnostic Tests:  CT chest 7/21: Extensive bilateral lobar and subsegmental pulmonary emboli involving all lobes.  RV to LV ratio was estimated at  1.6:1.  There were scattered groundglass opacities in the left upper lobe Unclear etiology Echocardiogram 7/22: Lower extremity Doppler 7/22: Micro Data:  COVID-19: Negative  Antimicrobials:    Interim history/subjective:  Sitting up in bed.  Confused  Objective   Blood pressure 130/81, pulse 95, temperature (Abnormal) 96.6 F (35.9 C), temperature source Oral, resp. rate (Abnormal) 26, height 5' 11"  (1.803 m), weight 74.5 kg, SpO2 94 %.        Intake/Output Summary (Last 24 hours) at 04/27/2019 0943 Last data filed at 04/27/2019 0300 Gross per 24 hour  Intake 1107.78 ml  Output 200 ml  Net 907.78 ml   Filed Weights   04/26/19 1650  Weight: 74.5 kg    Examination: General: Frail 85 year old male patient sitting up in bed.  He is not in distress, however does report he wants to go home HENT: Normal cephalic with the exception of some temporal wasting mucous membranes moist no JVD Lungs: Clear to auscultation Cardiovascular: Regular rate and rhythm but tachycardic Abdomen: Soft not tender Extremities: Warm and dry Neuro: Confused GU: Voids  Resolved Hospital Problem list     Assessment & Plan:  Acute hypoxic respiratory failure in the setting of submassive pulmonary embolus with evidence of RV strain -initially tachycardic w/ room air hypoxia -clinically not in distress pulmonary standpoint -She remains tachycardic but I think this is more secondary to his dementia and agitation and hypoxia.  He is indicating he wants to go home, and is preoccupied with that.  I think given his advanced dementia he would be fairly unreasonable to think he can be still following catheter  directed TPA if we believe that was indicated and currently hemodynamic wise I see no indication for systemic thrombolytics Plan/recommendation Continue systemic heparin with plan to transition to oral DOAC Follow-up echocardiogram, however given his advanced dementia I can imagine almost no scenario  where I would offer thrombolytic therapy Given his advanced dementia and clinical decline goals of care discussion really needs to take priority here I do not believe we need to continue COVID isolation, we have an excellent explanation for his hypoxia We will sign off  Labs   CBC: Recent Labs  Lab 04/26/19 1000 04/27/19 0524  WBC 10.4 11.6*  NEUTROABS 7.2  --   HGB 14.2 12.8*  HCT 42.8 38.8*  MCV 92.0 92.2  PLT 181 916    Basic Metabolic Panel: Recent Labs  Lab 04/26/19 1000 04/26/19 2046 04/27/19 0524  NA 136  --  137  K 4.6  --  4.4  CL 103  --  104  CO2 21*  --  24  GLUCOSE 321*  --  246*  BUN 16  --  12  CREATININE 1.25*  --  1.06  CALCIUM 9.2  --  8.9  MG  --  1.8  --    GFR: Estimated Creatinine Clearance: 55.6 mL/min (by C-G formula based on SCr of 1.06 mg/dL). Recent Labs  Lab 04/26/19 1000 04/26/19 1216 04/27/19 0524  PROCALCITON <0.10  --   --   WBC 10.4  --  11.6*  LATICACIDVEN 1.9 1.6  --     Liver Function Tests: Recent Labs  Lab 04/26/19 1000 04/27/19 0524  AST 15 13*  ALT 12 10  ALKPHOS 55 48  BILITOT 0.8 0.7  PROT 7.2 6.5  ALBUMIN 4.2 3.7   No results for input(s): LIPASE, AMYLASE in the last 168 hours. No results for input(s): AMMONIA in the last 168 hours.  ABG No results found for: PHART, PCO2ART, PO2ART, HCO3, TCO2, ACIDBASEDEF, O2SAT   Coagulation Profile: Recent Labs  Lab 04/27/19 0524  INR 1.2    Cardiac Enzymes: No results for input(s): CKTOTAL, CKMB, CKMBINDEX, TROPONINI in the last 168 hours.  HbA1C: Hgb A1c MFr Bld  Date/Time Value Ref Range Status  02/24/2019 09:40 AM 8.0 (H) <5.7 % of total Hgb Final    Comment:    For someone without known diabetes, a hemoglobin A1c value of 6.5% or greater indicates that they may have  diabetes and this should be confirmed with a follow-up  test. . For someone with known diabetes, a value <7% indicates  that their diabetes is well controlled and a value  greater  than or equal to 7% indicates suboptimal  control. A1c targets should be individualized based on  duration of diabetes, age, comorbid conditions, and  other considerations. . Currently, no consensus exists regarding use of hemoglobin A1c for diagnosis of diabetes for children. Marland Kitchen   03/16/2018 09:25 AM 8.9 (H) <5.7 % of total Hgb Final    Comment:    For someone without known diabetes, a hemoglobin A1c value of 6.5% or greater indicates that they may have  diabetes and this should be confirmed with a follow-up  test. . For someone with known diabetes, a value <7% indicates  that their diabetes is well controlled and a value  greater than or equal to 7% indicates suboptimal  control. A1c targets should be individualized based on  duration of diabetes, age, comorbid conditions, and  other considerations. . Currently, no consensus exists regarding use of hemoglobin  A1c for diagnosis of diabetes for children. .     CBG: Recent Labs  Lab 04/26/19 1642 04/27/19 0839  GLUCAP 233* 230*    Review of Systems:   Not able   Past Medical History  He,  has a past medical history of Bleeding duodenal ulcer, Dementia ([place]), Diabetes mellitus without complication ([place]), and Hyperlipidemia.   Surgical History    Past Surgical History:  Procedure Laterality Date  . APPENDECTOMY       Social History   reports that he has never smoked. His smokeless tobacco use includes chew. He reports previous alcohol use. He reports that he does not use drugs.   Family History   His family history includes Diabetes in his brother; Heart disease in his father.   Allergies No Known Allergies   Home Medications  Prior to Admission medications   Medication Sig Start Date End Date Taking? Authorizing Provider  donepezil (ARICEPT) 5 MG tablet TAKE 1 TABLET BY MOUTH AT BEDTIME Patient taking differently: Take 5 mg by mouth at bedtime.  04/04/19  Yes [name], MD  glipiZIDE (GLUCOTROL XL)  10 MG 24 hr tablet Take 1 tablet (10 mg total) by mouth daily with breakfast. 03/23/19  Yes [name], MD  linaclotide (LINZESS) 290 MCG CAPS capsule Take 290 mcg by mouth daily before breakfast.   Yes [provider]  lisinopril (ZESTRIL) 10 MG tablet Take 1 tablet by mouth once daily Patient taking differently: Take 10 mg by mouth daily.  03/16/19  Yes [name], MD  metFORMIN (GLUCOPHAGE) 500 MG tablet TAKE 2 TABLETS BY MOUTH TWICE DAILY WITH A MEAL Patient taking differently: Take 1,000 mg by mouth 2 (two) times daily with a meal.  03/21/19  Yes Pickard, Cammie [name], MD  pioglitazone (ACTOS) 30 MG tablet TAKE 1 TABLET BY MOUTH ONCE DAILY Patient taking differently: Take 30 mg by mouth daily.  10/05/17  Yes [name], MD  pravastatin (PRAVACHOL) 40 MG tablet TAKE 1 TABLET BY MOUTH ONCE DAILY Patient taking differently: Take 40 mg by mouth daily.  08/30/18  Yes [name], MD  Blood Glucose Monitoring Suppl (BLOOD GLUCOSE SYSTEM PAK) KIT Please dispense based on patient and insurance preference. Use as directed to monitor FSBS 2x. Dx: E11.E11.65. 03/24/19   [name], MD  Glucose Blood (BLOOD GLUCOSE TEST STRIPS) STRP Please dispense one touch verio flex lancets, and testing strips. Use as directed to monitor FSBS 2x. Dx: E11.E11.65. 03/30/19   [name], MD  Lancets MISC Please dispense based on patient and insurance preference. Use as directed to monitor FSBS 2x. Dx: E11.E11.65. 03/24/19   [name], MD     Erick Colace ACNP-BC [place] Pager # [phone] OR # [phone] if no answer

## 2019-04-27 NOTE — TOC Benefit Eligibility Note (Signed)
Transition of Care [facility]) Benefit Eligibility Note    Patient Details  Name: Ryan Gilmore MRN: [id number] Date of Birth: 1932/11/20   Medication/Dose: Lovenox 80mg , Xarelto 20mg  and Eliquis 5mg   Covered?: Yes  Tier: Other(tier 4)  Prescription Coverage Preferred Pharmacy: local  Spoke with Person/Company/Phone Number:: [facility] [phone]  Co-Pay: Lovenonot on formulary Exoxaparin preferred drug copay is $2,394.36, Lovenox copay is $45.00 and Eliquis copay is $45.00  Prior Approval: Yes(prior auth [id number])  Deductible: Unmet       Kerin Salen Phone Number: 04/27/2019, 11:59 AM

## 2019-04-28 ENCOUNTER — Inpatient Hospital Stay (HOSPITAL_COMMUNITY): Payer: Medicare HMO

## 2019-04-28 DIAGNOSIS — Z515 Encounter for palliative care: Secondary | ICD-10-CM

## 2019-04-28 DIAGNOSIS — Z7189 Other specified counseling: Secondary | ICD-10-CM

## 2019-04-28 DIAGNOSIS — J9601 Acute respiratory failure with hypoxia: Secondary | ICD-10-CM

## 2019-04-28 DIAGNOSIS — I2699 Other pulmonary embolism without acute cor pulmonale: Secondary | ICD-10-CM

## 2019-04-28 LAB — PROTIME-INR
INR: 1.3 — ABNORMAL HIGH (ref 0.8–1.2)
Prothrombin Time: 15.7 seconds — ABNORMAL HIGH (ref 11.4–15.2)

## 2019-04-28 LAB — GLUCOSE, CAPILLARY
Glucose-Capillary: 205 mg/dL — ABNORMAL HIGH (ref 70–99)
Glucose-Capillary: 207 mg/dL — ABNORMAL HIGH (ref 70–99)
Glucose-Capillary: 203 mg/dL — ABNORMAL HIGH (ref 70–99)
Glucose-Capillary: 219 mg/dL — ABNORMAL HIGH (ref 70–99)
Glucose-Capillary: 255 mg/dL — ABNORMAL HIGH (ref 70–99)
Glucose-Capillary: 172 mg/dL — ABNORMAL HIGH (ref 70–99)
Glucose-Capillary: 174 mg/dL — ABNORMAL HIGH (ref 70–99)

## 2019-04-28 LAB — CBC
HCT: 37.3 % — ABNORMAL LOW (ref 39.0–52.0)
Hemoglobin: 12.6 g/dL — ABNORMAL LOW (ref 13.0–17.0)
MCH: 31 pg (ref 26.0–34.0)
MCHC: 33.8 g/dL (ref 30.0–36.0)
MCV: 91.6 fL (ref 80.0–100.0)
Platelets: 144 10*3/uL — ABNORMAL LOW (ref 150–400)
RBC: 4.07 MIL/uL — ABNORMAL LOW (ref 4.22–5.81)
RDW: 13.2 % (ref 11.5–15.5)
WBC: 11.4 10*3/uL — ABNORMAL HIGH (ref 4.0–10.5)
nRBC: 0 % (ref 0.0–0.2)

## 2019-04-28 LAB — BASIC METABOLIC PANEL
Anion gap: 12 (ref 5–15)
BUN: 11 mg/dL (ref 8–23)
CO2: 20 mmol/L — ABNORMAL LOW (ref 22–32)
Calcium: 8.6 mg/dL — ABNORMAL LOW (ref 8.9–10.3)
Chloride: 107 mmol/L (ref 98–111)
Creatinine, Ser: 0.96 mg/dL (ref 0.61–1.24)
GFR calc Af Amer: >60 mL/min (ref >60)
GFR calc non Af Amer: >60 mL/min (ref >60)
Glucose, Bld: 213 mg/dL — ABNORMAL HIGH (ref 70–99)
Potassium: 3.8 mmol/L (ref 3.5–5.1)
Sodium: 139 mmol/L (ref 135–145)

## 2019-04-28 LAB — MAGNESIUM: Magnesium: 1.6 mg/dL — ABNORMAL LOW (ref 1.7–2.4)

## 2019-04-28 LAB — HEPARIN LEVEL (UNFRACTIONATED): Heparin Unfractionated: 0.42 IU/mL (ref 0.30–0.70)

## 2019-04-28 MED ORDER — MAGNESIUM SULFATE 2 GM/50ML IV SOLN
2.0000 g | Freq: Once | INTRAVENOUS | Status: AC
  Administered 2019-04-28: 2 g via INTRAVENOUS
  Filled 2019-04-28: qty 50

## 2019-04-28 NOTE — Progress Notes (Addendum)
PROGRESS NOTE  TAMARCUS CONDIE WUJ:[id number] DOB: April 28, 1933 DOA: 04/26/2019 PCP: [name], MD  HPI/Recap of past 24 hours:  He wants to go home, denies pain He is demented, could not remember why he is in the hospital  Assessment/Plan: Active Problems:   Diabetes mellitus type 2, noninsulin dependent ([place])   Acute pulmonary embolism without acute cor pulmonale ([place])   Essential hypertension   AKI (acute kidney injury) ([place])   Acute on chronic respiratory failure with hypoxia ([place])   Vascular dementia without behavioral disturbance ([place])   FTT (failure to thrive) in adult   Palliative care by specialist   DNR (do not resuscitate) discussion  Acute bilateral PE/ right heart strain /acute hypoxic respiratory failure/acute bilateral DVT -per daughter patient has been very sedentary recently -echo lvef wnl, There is right ventricular volume and pressure overload. Right ventricular systolic pressure is severely elevated with an estimated pressure of 74.2 mmHg -critical care input appreciated,  no plan for thrombolysis --plan for heparin dripx48hrs, then transition to [place]  -wean o2 as tolerated, likely will need home 02  AKI on CKD II Cr 1.25 on presentation Appear dehydrated, he received hydration, Cr improved , today is 0.96  Hypomagnesemia: replace mag   HTN: Home meds lisinopril held for now due to acute PE and AKI  noninsulin dependent dm2 a1c in 02/2019 was 8 Am blood glucose is 246 On ssi here Home meds metformin/glipizide/actos held On statin   Elevated PSA Case discussed with urology on call Dr Matilde Sprang per daughter's request. Per [name], patient needs to reschedule urology appoint till 3-4 weeks out from acute PE  FTT/ progressive dementia/significant weight loss -per daughter patient has been very sedentary, mostly in bed or in chair, most of the days -PT eval recommend home health -Palliative care consulted for goals of care , recommend  continue goals of care discussion and follow up with community palliative care services   Code Status: full  Family Communication: patient in room, daughter over the phone  Disposition Plan: likely home with home o2 and home health Pt/RN/Aids/socail worker   Consultants:  Critical care  Palliative care  Procedures:  none  Antibiotics:  none   Objective: BP 139/79 (BP Location: Right Arm)   Pulse 97   Temp 98 F (36.7 C) (Oral)   Resp 18   Ht 5\' 11"  (1.803 m)   Wt 72.8 kg   SpO2 94%   BMI 22.39 kg/m   Intake/Output Summary (Last 24 hours) at 04/28/2019 1755 Last data filed at 04/28/2019 1600 Gross per 24 hour  Intake 1872.51 ml  Output 650 ml  Net 1222.51 ml   Filed Weights   04/26/19 1650 04/28/19 0503  Weight: 74.5 kg 72.8 kg    Exam: Patient is examined daily including today on 04/28/2019, exams remain the same as of yesterday except that has changed    General:  NAD, pleasantly confused, he is not oriented to time, he knows he is in [place], he knows he is in the hospital but not able to state the name  Cardiovascular: tachycardia has improved  Respiratory: CTABL  Abdomen: Soft/ND/NT, positive BS  Musculoskeletal: No Edema  Neuro: alert, oriented to person and place Data Reviewed: Basic Metabolic Panel: Recent Labs  Lab 04/26/19 1000 04/26/19 2046 04/27/19 0524 04/28/19 0215  NA 136  --  137 139  K 4.6  --  4.4 3.8  CL 103  --  104 107  CO2 21*  --  24 20*  GLUCOSE 321*  --  246* 213*  BUN 16  --  12 11  CREATININE 1.25*  --  1.06 0.96  CALCIUM 9.2  --  8.9 8.6*  MG  --  1.8  --  1.6*   Liver Function Tests: Recent Labs  Lab 04/26/19 1000 04/27/19 0524  AST 15 13*  ALT 12 10  ALKPHOS 55 48  BILITOT 0.8 0.7  PROT 7.2 6.5  ALBUMIN 4.2 3.7   No results for input(s): LIPASE, AMYLASE in the last 168 hours. No results for input(s): AMMONIA in the last 168 hours. CBC: Recent Labs  Lab 04/26/19 1000 04/27/19 0524 04/28/19  0215  WBC 10.4 11.6* 11.4*  NEUTROABS 7.2  --   --   HGB 14.2 12.8* 12.6*  HCT 42.8 38.8* 37.3*  MCV 92.0 92.2 91.6  PLT 181 170 144*   Cardiac Enzymes:   No results for input(s): CKTOTAL, CKMB, CKMBINDEX, TROPONINI in the last 168 hours. BNP (last 3 results) Recent Labs    04/26/19 1005  BNP 474.4*    ProBNP (last 3 results) No results for input(s): PROBNP in the last 8760 hours.  CBG: Recent Labs  Lab 04/28/19 0003 04/28/19 0327 04/28/19 0806 04/28/19 1151 04/28/19 1719  GLUCAP 207* 203* 219* 255* 172*    Recent Results (from the past 240 hour(s))  SARS Coronavirus 2 [facility] order, Performed in [place] hospital lab)     Status: None   Collection Time: 04/26/19 10:00 AM   Specimen: Nasopharyngeal Swab  Result Value Ref Range Status   SARS Coronavirus 2 NEGATIVE NEGATIVE Final    Comment: (NOTE) If result is NEGATIVE SARS-CoV-2 target nucleic acids are NOT DETECTED. The SARS-CoV-2 RNA is generally detectable in upper and lower  respiratory specimens during the acute phase of infection. The lowest  concentration of SARS-CoV-2 viral copies this assay can detect is 250  copies / mL. A negative result does not preclude SARS-CoV-2 infection  and should not be used as the sole basis for treatment or other  patient management decisions.  A negative result may occur with  improper specimen collection / handling, submission of specimen other  than nasopharyngeal swab, presence of viral mutation(s) within the  areas targeted by this assay, and inadequate number of viral copies  (<250 copies / mL). A negative result must be combined with clinical  observations, patient history, and epidemiological information. If result is POSITIVE SARS-CoV-2 target nucleic acids are DETECTED. The SARS-CoV-2 RNA is generally detectable in upper and lower  respiratory specimens dur ing the acute phase of infection.  Positive  results are indicative of active infection with  SARS-CoV-2.  Clinical  correlation with patient history and other diagnostic information is  necessary to determine patient infection status.  Positive results do  not rule out bacterial infection or co-infection with other viruses. If result is PRESUMPTIVE POSTIVE SARS-CoV-2 nucleic acids MAY BE PRESENT.   A presumptive positive result was obtained on the submitted specimen  and confirmed on repeat testing.  While 2019 novel coronavirus  (SARS-CoV-2) nucleic acids may be present in the submitted sample  additional confirmatory testing may be necessary for epidemiological  and / or clinical management purposes  to differentiate between  SARS-CoV-2 and other Sarbecovirus currently known to infect humans.  If clinically indicated additional testing with an alternate test  methodology [phone]) is advised. The SARS-CoV-2 RNA is generally  detectable in upper and lower respiratory sp ecimens during the acute  phase  of infection. The expected result is Negative. Fact Sheet for Patients:  StrictlyIdeas.no Fact Sheet for Healthcare Providers: BankingDealers.co.za This test is not yet approved or cleared by the [place] FDA and has been authorized for detection and/or diagnosis of SARS-CoV-2 by FDA under an Emergency Use Authorization (EUA).  This EUA will remain in effect (meaning this test can be used) for the duration of the COVID-19 declaration under Section 564(b)(1) of the Act, 21 U.S.C. section 360bbb-3(b)(1), unless the authorization is terminated or revoked sooner. Performed at [facility], [place] [address] [place], [place] [address]   Blood Culture (routine x 2)     Status: None (Preliminary result)   Collection Time: 04/26/19 10:00 AM   Specimen: BLOOD LEFT FOREARM  Result Value Ref Range Status   Specimen Description   Final    BLOOD LEFT FOREARM Performed at [facility], [place] [address],  [place], [place] [address]    Special Requests   Final    BOTTLES DRAWN AEROBIC AND ANAEROBIC Blood Culture results may not be optimal due to an excessive volume of blood received in culture bottles Performed at [facility] [address], [place], [place] [address]    Culture   Final    NO GROWTH 2 DAYS Performed at [facility] [address], [place], [place] [address]    Report Status PENDING  Incomplete  Blood Culture (routine x 2)     Status: None (Preliminary result)   Collection Time: 04/26/19 10:05 AM   Specimen: BLOOD  Result Value Ref Range Status   Specimen Description   Final    BLOOD LEFT ANTECUBITAL Performed at [facility] [address] [place], [place] [address]    Special Requests   Final    BOTTLES DRAWN AEROBIC AND ANAEROBIC Blood Culture adequate volume Performed at [facility] [address], [place], [place] [address]    Culture   Final    NO GROWTH 2 DAYS Performed at [facility] [address], [place], [place] [address]    Report Status PENDING  Incomplete  MRSA PCR Screening     Status: None   Collection Time: 04/26/19  4:34 PM   Specimen: Nasal Mucosa; Nasopharyngeal  Result Value Ref Range Status   MRSA by PCR NEGATIVE NEGATIVE Final    Comment:        The GeneXpert MRSA Assay (FDA approved for NASAL specimens only), is one component of a comprehensive MRSA colonization surveillance program. It is not intended to diagnose MRSA infection nor to guide or monitor treatment for MRSA infections. Performed at [facility], [place] [address] [place], [place] [address]      Studies: Vas [place] Lower Extremity Venous (dvt)  Result Date: 04/28/2019  Lower Venous Study Indications: Pulmonary embolism.  Risk Factors: Confirmed PE. Comparison Study: No prior studies. Performing Technologist: Oliver Hum RVT  Examination Guidelines: A complete evaluation includes  B-mode imaging, spectral Doppler, color Doppler, and power Doppler as needed of all accessible portions of each vessel. Bilateral testing is considered an integral part of a complete examination. Limited examinations for reoccurring indications may be performed as noted.  +---------+---------------+---------+-----------+----------+-------+ RIGHT    CompressibilityPhasicitySpontaneityPropertiesSummary +---------+---------------+---------+-----------+----------+-------+ CFV      Full           Yes      Yes                          +---------+---------------+---------+-----------+----------+-------+  SFJ      Full                                                 +---------+---------------+---------+-----------+----------+-------+ FV Prox  Partial        No       No                   Acute   +---------+---------------+---------+-----------+----------+-------+ FV Mid   Full                                                 +---------+---------------+---------+-----------+----------+-------+ FV DistalFull                                                 +---------+---------------+---------+-----------+----------+-------+ PFV      Full                                                 +---------+---------------+---------+-----------+----------+-------+ POP      Full           Yes      Yes                          +---------+---------------+---------+-----------+----------+-------+ PTV      Full                                                 +---------+---------------+---------+-----------+----------+-------+ PERO     Full                                                 +---------+---------------+---------+-----------+----------+-------+   +---------+---------------+---------+-----------+----------+-------+ LEFT     CompressibilityPhasicitySpontaneityPropertiesSummary +---------+---------------+---------+-----------+----------+-------+ CFV      Full            Yes      Yes                          +---------+---------------+---------+-----------+----------+-------+ SFJ      Full                                                 +---------+---------------+---------+-----------+----------+-------+ FV Prox  Full                                                 +---------+---------------+---------+-----------+----------+-------+ FV Mid   Full                                                 +---------+---------------+---------+-----------+----------+-------+  FV DistalFull                                                 +---------+---------------+---------+-----------+----------+-------+ PFV      Full                                                 +---------+---------------+---------+-----------+----------+-------+ POP      Full           Yes      Yes                          +---------+---------------+---------+-----------+----------+-------+ PTV      Full                                                 +---------+---------------+---------+-----------+----------+-------+ PERO     Full                                                 +---------+---------------+---------+-----------+----------+-------+ SSV      None                                         Acute   +---------+---------------+---------+-----------+----------+-------+     Summary: Right: Findings consistent with acute deep vein thrombosis involving the right femoral vein. No cystic structure found in the popliteal fossa. Left: Findings consistent with acute superficial vein thrombosis involving the left small saphenous vein. There is no evidence of deep vein thrombosis in the lower extremity. No cystic structure found in the popliteal fossa.  *See table(s) above for measurements and observations. Electronically signed by Monica Martinez MD on 04/28/2019 at 5:42:27 PM.    Final     Scheduled Meds: . Chlorhexidine Gluconate Cloth  6 each  Topical Daily  . donepezil  5 mg Oral QHS  . insulin aspart  0-15 Units Subcutaneous TID WC  . insulin aspart  0-5 Units Subcutaneous QHS  . linaclotide  290 mcg Oral QAC breakfast  . LORazepam  1 mg Intravenous Once  . mouth rinse  15 mL Mouth Rinse BID  . pravastatin  40 mg Oral Daily    Continuous Infusions: . sodium chloride 50 mL/hr at 04/28/19 0514  . heparin 1,350 Units/hr (04/28/19 0511)  . magnesium sulfate bolus IVPB       Time spent: 32mins,  I have personally reviewed and interpreted on  04/28/2019 daily labs, tele strips, imagings as discussed above under date review session and assessment and plans.  I reviewed all nursing notes, pharmacy notes, consultant notes,  vitals, pertinent old records  I have discussed plan of care as described above with RN , patient and family on 04/28/2019   Florencia Reasons MD, PhD  [facility] Pager [phone]. If 7PM-7AM, please contact night-coverage at [email or web address], password [facility] 04/28/2019, 5:55 PM  LOS: 2  days

## 2019-04-28 NOTE — Progress Notes (Signed)
SATURATION QUALIFICATIONS: (This note is used to comply with regulatory documentation for home oxygen)  Patient Saturations on Room Air at Rest = 94%  Patient Saturations on Room Air while Ambulating = 83%  Patient Saturations on -- Liters of oxygen while Ambulating = TBD  Please briefly explain why patient needs home oxygen: to maintain appropriate SaO2 levels with mobility.   Blondell Reveal Kistler PT 04/28/2019  Acute Rehabilitation Services Pager [phone] Office [phone]

## 2019-04-28 NOTE — Progress Notes (Signed)
ANTICOAGULATION CONSULT NOTE   Pharmacy Consult for Heparin Indication: pulmonary embolus  No Known Allergies  Patient Measurements: Height: 5\' 11"  (180.3 cm) Weight: 160 lb 8 oz (72.8 kg) IBW/kg (Calculated) : 75.3 Heparin Dosing Weight: 739 kg  Vital Signs: Temp: 97.5 F (36.4 C) (07/23 0509) Temp Source: Oral (07/23 0509) BP: 154/85 (07/23 0509) Pulse Rate: 95 (07/23 0509)  Labs: Recent Labs    04/26/19 1000 04/26/19 2046 04/27/19 0524 04/28/19 0215  HGB 14.2  --  12.8* 12.6*  HCT 42.8  --  38.8* 37.3*  PLT 181  --  170 144*  LABPROT  --   --  14.7 15.7*  INR  --   --  1.2 1.3*  HEPARINUNFRC  --  0.32 0.39 0.42  CREATININE 1.25*  --  1.06 0.96   Estimated Creatinine Clearance: 60 mL/min (by C-G formula based on SCr of 0.96 mg/dL).  Assessment: 70 yoM with PMH HTN, HLD, dementia, DM2 sent from PCPs office for hypoxia. D-dimer and BNP elevated; found to have bilateral PE with RH strain on CT. Pharmacy to dose heparin.   Baseline INR WNL, aPTT not done  Prior anticoagulation: none  Significant events:  Today, 04/28/2019:  CBC: Hgb 12.6; Plt sl decr 144  Hep level 0.42 units/ml, in therapeutic range  No bleeding or infusion issues per nursing  Goal of Therapy: Heparin level 0.3-0.7 units/ml Monitor platelets by anticoagulation protocol: Yes  Plan:  Continue Heparin infusion at 1350 units/hr   Daily CBC and heparin level  Monitor for signs of bleeding or thrombosis  Plan is for IV heparin x 48 hr followed by transition to [place] or warfarin depending on cost; case management notified.  Minda Ditto PharmD 04/28/2019, 7:06 AM

## 2019-04-28 NOTE — Progress Notes (Signed)
Bilateral lower extremity venous duplex has been completed. Preliminary results can be found in CV Proc through chart review.  Results were given to the patient's nurse, Megan.  04/28/19 10:49 AM Ryan Gilmore RVT

## 2019-04-28 NOTE — Progress Notes (Signed)
Updated patient's wife Everlene Farrier over the phone. Informed Everlene Farrier that patient was transferred to room 1401. Wife appreciative of staff and communication with her. States she will call back later to speak with nursing staff and her husband.

## 2019-04-28 NOTE — Progress Notes (Signed)
Patients daughter and wife called and updated

## 2019-04-28 NOTE — Progress Notes (Signed)
Patient ID: Ryan Gilmore, male   DOB: June 01, 1933, 85 y.o.   MRN: [id number]  This NP visited patient at the bedside as a follow up to yesterday's [place].  Patient is sitting on sie of bed enjoying lunch.   Oriented to person and place but no understanding of need for hospitalization  Only  "wants to go home'  Spoke to daughter by phone for continued discussion regarding current medical situation, GOCs, disposition and anticipatory care needs.  Once patient is medically stable for discharge, family is aware of needs for f/u as OP with PCP and Urology regarding elevated PSA and overall failure to thrive/weight loss.   Discussed with patient the importance of continued conversation with family and their  medical providers regarding overall plan of care and treatment options,  ensuring decisions are within the context of the patients values and GOCs.  Patient is high risk for decompensation.  Recommend OP palliative care services on discharge   Stressed the importance of documentation of AD and HPOA   Questions and concerns addressed   Discussed with Dr Erlinda Hong  Total time spent on the unit was 25 minutes   Greater than 50% of the time was spent in counseling and coordination of care  Wadie Lessen NP  Palliative Medicine Team Team Phone # [phone] Pager [phone]

## 2019-04-28 NOTE — Evaluation (Signed)
Physical Therapy Evaluation Patient Details Name: Ryan Gilmore MRN: [id number] DOB: [birth year] Today's Date: 04/28/2019   History of Present Illness  68 yoM with PMH HTN, HLD, dementia, DM2 sent from PCPs office for hypoxia. D-dimer and BNP elevated; found to have bilateral PE with R heart strain on CT  Clinical Impression   Pt admitted with above diagnosis. Pt currently with functional limitations due to the deficits listed below (see PT Problem List). Pt ambulated 130' holding IV pole, with mild unsteadiness. SaO2 94% on room air at rest, 83% on room air walking, HR 122 walking.  Pt will benefit from skilled PT to increase their independence and safety with mobility to allow discharge to the venue listed below.         Follow Up Recommendations Home health PT    Equipment Recommendations  Rolling walker with 5" wheels ; supplemental O2   Recommendations for Other Services       Precautions / Restrictions Precautions Precautions: Fall Precaution Comments: monitor O2 Restrictions Weight Bearing Restrictions: No      Mobility  Bed Mobility Overal bed mobility: Needs Assistance Bed Mobility: Supine to Sit     Supine to sit: Min assist     General bed mobility comments: min A to raise trunk and pivot hips to EOB with pad  Transfers Overall transfer level: Needs assistance Equipment used: 1 person hand held assist Transfers: Sit to/from Stand Sit to Stand: Min guard         General transfer comment: mild unsteadiness upon standing, no loss of balance  Ambulation/Gait Ambulation/Gait assistance: Min guard Gait Distance (Feet): 130 Feet Assistive device: IV Pole Gait Pattern/deviations: Step-through pattern;Decreased stride length Gait velocity: WFL   General Gait Details: mild unsteadiness, SaO2 83% on room air walking, HR 122, SaO2 94% on room air at rest, 2/4 dyspnea walking  Stairs            Wheelchair Mobility    Modified Rankin (Stroke Patients  Only)       Balance Overall balance assessment: Mild deficits observed, not formally tested                                           Pertinent Vitals/Pain Pain Assessment: Faces Faces Pain Scale: No hurt    Home Living Family/patient expects to be discharged to:: Private residence Living Arrangements: Spouse/significant other     Home Access: Level entry     Home Layout: One level Home Equipment: None      Prior Function Level of Independence: Independent         Comments: above info provided by patient, however he is poor historian (he thinks he's in [place]), noted hx of dementia     Hand Dominance        Extremity/Trunk Assessment   Upper Extremity Assessment Upper Extremity Assessment: Overall WFL for tasks assessed    Lower Extremity Assessment Lower Extremity Assessment: Overall WFL for tasks assessed    Cervical / Trunk Assessment Cervical / Trunk Assessment: Normal  Communication   Communication: No difficulties  Cognition Arousal/Alertness: Awake/alert Behavior During Therapy: WFL for tasks assessed/performed Overall Cognitive Status: No family/caregiver present to determine baseline cognitive functioning  General Comments: pt stated birthdate is 30-Dec-1932, (chart states 08/07/1933), pt not oriented to location nor to situation, noted hx of dementia, he is able to follow commands and is pleasant      General Comments      Exercises     Assessment/Plan    PT Assessment Patient needs continued PT services  PT Problem List Decreased cognition;Decreased activity tolerance;Decreased balance;Decreased mobility       PT Treatment Interventions Gait training;DME instruction;Functional mobility training;Therapeutic activities;Balance training;Patient/family education    PT Goals (Current goals can be found in the Care Plan section)  Acute Rehab PT Goals Patient Stated Goal: pt  stated he wants to go home and see his family PT Goal Formulation: Patient unable to participate in goal setting Time For Goal Achievement: 05/05/19 Potential to Achieve Goals: Good    Frequency Min 3X/week   Barriers to discharge        Co-evaluation               AM-PAC PT "6 Clicks" Mobility  Outcome Measure Help needed turning from your back to your side while in a flat bed without using bedrails?: A Little Help needed moving from lying on your back to sitting on the side of a flat bed without using bedrails?: A Little Help needed moving to and from a bed to a chair (including a wheelchair)?: A Little Help needed standing up from a chair using your arms (e.g., wheelchair or bedside chair)?: A Little Help needed to walk in hospital room?: A Little Help needed climbing 3-5 steps with a railing? : A Lot 6 Click Score: 17    End of Session Equipment Utilized During Treatment: Gait belt;Oxygen Activity Tolerance: Patient tolerated treatment well Patient left: in chair;with call bell/phone within reach;with chair alarm set Nurse Communication: Mobility status PT Visit Diagnosis: Difficulty in walking, not elsewhere classified (R26.2)    Time: 8309-4076 PT Time Calculation (min) (ACUTE ONLY): 22 min   Charges:   PT Evaluation $PT Eval Moderate Complexity: 1 Mod        Philomena Doheny PT 04/28/2019  Acute Rehabilitation Services Pager [phone] Office [phone]

## 2019-04-28 NOTE — Progress Notes (Signed)
Inpatient Diabetes Program Recommendations  AACE/ADA: New Consensus Statement on Inpatient Glycemic Control (2015)  Target Ranges:  Prepandial:   less than 140 mg/dL      Peak postprandial:   less than 180 mg/dL (1-2 hours)      Critically ill patients:  140 - 180 mg/dL    Results for ROBEL, WUERTZ (MRN [id number]) as of 04/28/2019 11:22  Ref. Range 04/28/2019 00:03 04/28/2019 03:27 04/28/2019 08:06  Glucose-Capillary Latest Ref Range: 70 - 99 mg/dL 207 (H) 203 (H) 219 (H)    Admit Bilateral pulmonary embolism with right heart strain  History: DM, Dementia   Home DM Meds: Glipizide 10 mg Daily + Metformin 1000 mg BID + Actos 30 mg Daily  Current Orders: Novolog Moderate Correction Scale/ SSI (0-15 units) TID AC + HS     PCP: Dr. Dennard Schaumann w/ [facility]--Per notes from June 15, Victoza was stopped back in early June b/c pt was not eating well--MD talked w/ pt's dtr about possible need for insulin but pt and his wife likely can't handle insulin injections--Discussion with dtr about just keeping CBGs under 300 and not trying to control CBGs too tightly--Dtr and PCP in agreement.       MD- Note CBGs elevated in hospital setting.   Home oral DM meds are on hold.  May consider the following if pt continues to have elevated CBGs >200 in the hospital setting:  Start low dose basal insulin: Lantus 7 units QHS (0.1 units/kg dosing)    --Will follow patient during hospitalization--  Wyn Quaker RN, MSN, CDE Diabetes Coordinator Inpatient Glycemic Control Team Team Pager: [phone] (8a-5p)

## 2019-04-28 NOTE — TOC Initial Note (Signed)
Transition of Care [facility]) - Initial/Assessment Note    Patient Details  Name: Ryan Gilmore MRN: [id number] Date of Birth: 06/26/1933  [facility]) CM/SW Contact:    Lia Hopping, [place] Phone Number: 04/28/2019, 3:44 PM  Clinical Narrative:                 CSW reached out to the patient spouse and daughter Ryan Gilmore, both agreeable to [place] for physical therapy. Patient prefers agency in network with her insurance. CSW provided a list of agencies. Patient family agreeable to [facility]. CSW confirm with rep Santiago Glad.  Expected Discharge Plan: [place] Barriers to Discharge: No Barriers Identified   Patient Goals and CMS Choice Patient states their goals for this hospitalization and ongoing recovery are:: Recover at Home CMS Medicare.gov Compare Post Acute Care list provided to:: Patient Represenative (must comment) Choice offered to / list presented to : Adult Children  Expected Discharge Plan and Services Expected Discharge Plan: [place] In-house Referral: Clinical Social Work   Post Acute Care Choice: [place] arrangements for the past 2 months: [place] Expected Discharge Date: (unknown)                         HH Arranged: PT [place] Agency: [place] ([facility]) Date [place]: 04/28/19 Time [place]: [phone] Representative spoke with at [place]: Santiago Glad  Prior Living Arrangements/Services Living arrangements for the past 2 months: [place] with:: Spouse Patient language and need for interpreter reviewed:: No Do you feel safe going back to the place where you live?: Yes      Need for Family Participation in Patient Care: Yes (Comment) Care giver support system in place?: Yes (comment)   Criminal Activity/Legal Involvement Pertinent to Current Situation/Hospitalization: No - Comment as needed  Activities of Daily Living Home Assistive Devices/Equipment: CBG  Meter, Eyeglasses ADL Screening (condition at time of admission) [name] cognitive ability adequate to safely complete daily activities?: No Is the patient deaf or have difficulty hearing?: No Does the patient have difficulty seeing, even when wearing glasses/contacts?: Yes Does the patient have difficulty concentrating, remembering, or making decisions?: Yes Patient able to express need for assistance with ADLs?: Yes Does the patient have difficulty dressing or bathing?: Yes Independently performs ADLs?: No Communication: Independent Dressing (OT): Needs assistance Is this a change from baseline?: Change from baseline, expected to last >3 days Grooming: Needs assistance Is this a change from baseline?: Change from baseline, expected to last >3 days Feeding: Needs assistance Is this a change from baseline?: Change from baseline, expected to last >3 days Bathing: Needs assistance Is this a change from baseline?: Change from baseline, expected to last >3 days Toileting: Needs assistance Is this a change from baseline?: Change from baseline, expected to last >3days In/Out Bed: Needs assistance Is this a change from baseline?: Change from baseline, expected to last >3 days Walks in Home: Needs assistance Is this a change from baseline?: Change from baseline, expected to last >3 days Does the patient have difficulty walking or climbing stairs?: Yes(secondary to weakness) Weakness of Legs: Both Weakness of Arms/Hands: Both  Permission Sought/Granted Permission sought to share information with : Case Manager, Family Supports Permission granted to share information with : Yes, Verbal Permission Granted        Permission granted to share info w Relationship: Spouse     Emotional Assessment  Appearance:: Appears stated age     Orientation: : Oriented to Self, Oriented to Place Alcohol / Substance Use: Not Applicable Psych Involvement: No (comment)  Admission diagnosis:   Hyperglycemia [R73.9] Acute pulmonary embolism without acute cor pulmonale, unspecified pulmonary embolism type ([place]) [I26.99] Patient Active Problem List   Diagnosis Date Noted  . Palliative care by specialist   . DNR (do not resuscitate) discussion   . Acute on chronic respiratory failure with hypoxia ([place])   . Vascular dementia without behavioral disturbance ([place])   . FTT (failure to thrive) in adult   . Acute pulmonary embolism without acute cor pulmonale ([place]) 04/26/2019  . Essential hypertension 04/26/2019  . AKI (acute kidney injury) ([place]) 04/26/2019  . Chronic midline low back pain without sciatica 10/12/2017  . Diabetes mellitus type 2, noninsulin dependent ([place]) 06/28/2013  . Other and unspecified hyperlipidemia 06/28/2013   PCP:  [name], MD Pharmacy:   [facility] [place] [name] - [address] [place] ([place]) [place] [address] Phone: [phone] Fax: [phone]     Social Determinants of Health (SDOH) Interventions    Readmission Risk Interventions No flowsheet data found.

## 2019-04-29 DIAGNOSIS — I82503 Chronic embolism and thrombosis of unspecified deep veins of lower extremity, bilateral: Secondary | ICD-10-CM

## 2019-04-29 DIAGNOSIS — IMO0001 Reserved for inherently not codable concepts without codable children: Secondary | ICD-10-CM

## 2019-04-29 LAB — CBC
HCT: 34.4 % — ABNORMAL LOW (ref 39.0–52.0)
Hemoglobin: 11 g/dL — ABNORMAL LOW (ref 13.0–17.0)
MCH: 29.6 pg (ref 26.0–34.0)
MCHC: 32 g/dL (ref 30.0–36.0)
MCV: 92.5 fL (ref 80.0–100.0)
Platelets: 180 K/uL (ref 150–400)
RBC: 3.72 MIL/uL — ABNORMAL LOW (ref 4.22–5.81)
RDW: 13.2 % (ref 11.5–15.5)
WBC: 7.7 K/uL (ref 4.0–10.5)
nRBC: 0 % (ref 0.0–0.2)

## 2019-04-29 LAB — BASIC METABOLIC PANEL
Anion gap: 10 (ref 5–15)
BUN: 11 mg/dL (ref 8–23)
CO2: 23 mmol/L (ref 22–32)
Calcium: 8.4 mg/dL — ABNORMAL LOW (ref 8.9–10.3)
Chloride: 104 mmol/L (ref 98–111)
Creatinine, Ser: 1.04 mg/dL (ref 0.61–1.24)
GFR calc Af Amer: >60 mL/min (ref >60)
GFR calc non Af Amer: >60 mL/min (ref >60)
Glucose, Bld: 206 mg/dL — ABNORMAL HIGH (ref 70–99)
Potassium: 3.7 mmol/L (ref 3.5–5.1)
Sodium: 137 mmol/L (ref 135–145)

## 2019-04-29 LAB — PROTIME-INR
INR: 1.1 (ref 0.8–1.2)
Prothrombin Time: 14.5 seconds (ref 11.4–15.2)

## 2019-04-29 LAB — GLUCOSE, CAPILLARY
Glucose-Capillary: 199 mg/dL — ABNORMAL HIGH (ref 70–99)
Glucose-Capillary: 302 mg/dL — ABNORMAL HIGH (ref 70–99)

## 2019-04-29 LAB — MAGNESIUM: Magnesium: 1.8 mg/dL (ref 1.7–2.4)

## 2019-04-29 LAB — HEPARIN LEVEL (UNFRACTIONATED): Heparin Unfractionated: 0.16 IU/mL — ABNORMAL LOW (ref 0.30–0.70)

## 2019-04-29 MED ORDER — ELIQUIS 5 MG VTE STARTER PACK: ORAL_TABLET | ORAL | 0 refills | Status: DC

## 2019-04-29 MED ORDER — APIXABAN 5 MG PO TABS: 5.0000 mg | ORAL_TABLET | Freq: Two times a day (BID) | ORAL | Status: DC

## 2019-04-29 MED ORDER — APIXABAN 5 MG PO TABS
10.0000 mg | ORAL_TABLET | Freq: Two times a day (BID) | ORAL | Status: DC
  Administered 2019-04-29: 10 mg via ORAL
  Filled 2019-04-29: qty 2

## 2019-04-29 MED ORDER — HEPARIN (PORCINE) 25000 UT/250ML-% IV SOLN
1500.0000 [IU]/h | INTRAVENOUS | Status: AC
  Filled 2019-04-29: qty 250

## 2019-04-29 NOTE — TOC Transition Note (Signed)
Transition of Care [facility]) - CM/SW Discharge Note   Patient Details  Name: Ryan Gilmore MRN: [id number] Date of Birth: 04/02/1933  Transition of Care [facility]) CM/SW Contact:  Dessa Phi, RN Phone Number: 04/29/2019, 10:45 AM   Clinical Narrative: Patient for [facility] w/[facility] rep Santiago Glad aware. Home 02 ordered. Once 02 sats documented-then [employer] rep Thedore Mins will deliver home 02 travel tank to rm prior d/c. No further CM needs.      Final next level of care: [place] Barriers to Discharge: No Barriers Identified   Patient Goals and CMS Choice Patient states their goals for this hospitalization and ongoing recovery are:: Recover at Home CMS Medicare.gov Compare Post Acute Care list provided to:: Patient Represenative (must comment) Choice offered to / list presented to : Adult Children  Discharge Placement                       Discharge Plan and Services In-house Referral: Clinical Social Work   Post Acute Care Choice: Home Health          DME Arranged: Oxygen DME Agency: [employer]   Time DME Agency Contacted: 50 Representative spoke with at DME Agency: [facility] HH Arranged: RN, PT, Nurse's Aide, Social Work [employer] Agency: [place] ([place]) Date [place]: 04/29/19 Time [place]: 8811 Representative spoke with at [place] I: [place] ([place]) Interventions     Readmission Risk Interventions No flowsheet data found.

## 2019-04-29 NOTE — Plan of Care (Signed)
Goals of care are met. Instructions reviewed with patients spouse and daughter. No questions or concerns related to DC instruction. Pt is awaiting home equipment oxygen.

## 2019-04-29 NOTE — Progress Notes (Signed)
No change from am assessment. [place] equipment delivered. Instructions reviewed.

## 2019-04-29 NOTE — Progress Notes (Signed)
[place] Based Palliative Care  Referral received for outpatient palliative care services to assist in ongoing GOCs discussions.  Attempted to contact wife, no answer, but left message.  Contacted PCP office, Dr. Dennard Schaumann and made them aware that we will schedule appointment with pt once home.  Venia Carbon RN, BSN, [facility] Liaison (in [place]) [phone]

## 2019-04-29 NOTE — Discharge Instructions (Signed)
Information on my medicine - ELIQUIS (apixaban)  This medication education was reviewed with me or my healthcare representative as part of my discharge preparation.  The pharmacist that spoke with me during my hospital stay was:   Why was Eliquis prescribed for you? Eliquis was prescribed to treat blood clots that may have been found in the veins of your legs (deep vein thrombosis) or in your lungs (pulmonary embolism) and to reduce the risk of them occurring again.  What do You need to know about Eliquis ? The starting dose is 10 mg (two 5 mg tablets) taken TWICE daily for the FIRST SEVEN (7) DAYS, then on (enter date)  05/06/2019  the dose is reduced to ONE 5 mg tablet taken TWICE daily.  Eliquis may be taken with or without food.   Try to take the dose about the same time in the morning and in the evening. If you have difficulty swallowing the tablet whole please discuss with your pharmacist how to take the medication safely.  Take Eliquis exactly as prescribed and DO NOT stop taking Eliquis without talking to the doctor who prescribed the medication.  Stopping may increase your risk of developing a new blood clot.  Refill your prescription before you run out.  After discharge, you should have regular check-up appointments with your healthcare provider that is prescribing your Eliquis.    What do you do if you miss a dose? If a dose of ELIQUIS is not taken at the scheduled time, take it as soon as possible on the same day and twice-daily administration should be resumed. The dose should not be doubled to make up for a missed dose.  Important Safety Information A possible side effect of Eliquis is bleeding. You should call your healthcare provider right away if you experience any of the following: ? Bleeding from an injury or your nose that does not stop. ? Unusual colored urine (red or dark brown) or unusual colored stools (red or black). ? Unusual bruising for unknown  reasons. ? A serious fall or if you hit your head (even if there is no bleeding).  Some medicines may interact with Eliquis and might increase your risk of bleeding or clotting while on Eliquis. To help avoid this, consult your healthcare provider or pharmacist prior to using any new prescription or non-prescription medications, including herbals, vitamins, non-steroidal anti-inflammatory drugs (NSAIDs) and supplements.  This website has more information on Eliquis (apixaban): http://www.eliquis.com/eliquis/home

## 2019-04-29 NOTE — Discharge Summary (Signed)
Discharge Summary  Ryan Gilmore:[id number] DOB: 09-25-1933  PCP: [name], MD  Admit date: 04/26/2019 Discharge date: 04/29/2019  Time spent: 52mns, more than 50% time spent on coordination of care.  Recommendations for Outpatient Follow-up:  1. F/u with PCP within a week  for hospital discharge follow up, repeat cbc/bmp at follow up 2. Home health, home 02 3. Case manager notified to arrange Community palliative care  Discharge Diagnoses:  Active Hospital Problems   Diagnosis Date Noted   Palliative care by specialist    DNR (do not resuscitate) discussion    Acute hypoxemic respiratory failure (HCC)    Acute on chronic respiratory failure with hypoxia ([facility]    Vascular dementia without behavioral disturbance (HCC)    FTT (failure to thrive) in adult    Acute pulmonary embolism without acute cor pulmonale ([facility] 04/26/2019   Essential hypertension 04/26/2019   AKI (acute kidney injury) ([facility] 04/26/2019   Diabetes mellitus type 2, noninsulin dependent ([facility] 06/28/2013    Resolved Hospital Problems  No resolved problems to display.    Discharge Condition: stable  Diet recommendation: heart healthy/carb modified  Filed Weights   04/26/19 1650 04/28/19 0503 04/29/19 0601  Weight: 74.5 kg 72.8 kg 75.9 kg    History of present illness: (per admitting MD Dr PDoristine Bosworth PCP: PSusy Frizzle MD  Patient coming from: Home/PCP office  I have personally briefly reviewed patient[place] Chief Complaint: No complaint from patient/hypoxia at PCP office  HPI: Ryan WIRTis a 85y.o. male with medical history significant of hypertension, hyperlipidemia, dementia and type 2 diabetes mellitus was sent to the emergency department from his PCPs office for further evaluation of hypoxia.  Reportedly, patient went to see his PCP along with his wife for routine checkup however he was found to be hypoxic with saturation about  80% on room air so he was sent to the emergency department.  Patient has severe dementia to the point that he does not know why he is in the hospital.  When I talked to him, he did not even recall that he saw his PCP today.  I called patient's wife on the phone and she verified that patient has not been having any complaints such as chest pain, shortness of breath, fever, chills, sweating, nausea, vomiting, any problem with urination or with bowel movement.   ED Course: Upon arrival to the emergency department, patient was slightly tachypneic and tachycardic however blood pressure was within normal limits.  He was requiring 3 L of oxygen to keep his oxygen saturation more than 90%.  Initial work-up was directed toward possible COVID-19 infection.  Inflammatory markers were obtained and half of them are elevated.  Subsequently CT angiogram of the chest was done and he was diagnosed with heavy load of bilateral PE with right heart strain.  He was started on heparin and hospital service was consulted to admit the patient for further management.   Hospital Course:  Active Problems:   Diabetes mellitus type 2, noninsulin dependent ([facility]   Acute pulmonary embolism without acute cor pulmonale (HCC)   Essential hypertension   AKI (acute kidney injury) ([facility]   Acute on chronic respiratory failure with hypoxia (HCC)   Vascular dementia without behavioral disturbance (HCC)   FTT (failure to thrive) in adult   Palliative care by specialist   DNR (do not resuscitate) discussion   Acute hypoxemic respiratory failure ([facility]   Acute bilateral  DVT/Acute bilateral PE/ right heart strain /acute hypoxic respiratory failure -per daughter patient has been very sedentary recently -echo lvef wnl, There is right ventricular volume and pressure overload. Right ventricular systolic pressure is severely elevated with an estimated pressure of 74.2 mmHg -critical care input appreciated,  no plan for thrombolysis --he  received heparin dripx48hrs, then transition to Eliquis, case manager notified for eliquis meds assistance  -need home 02  AKI on CKD II, improved  Cr 1.25 on presentation Appear dehydrated, he received hydration, Cr improved , today is 0.96  Hypomagnesemia: replaced, improved   HTN: Home meds lisinopril held for now due to acute PE and AKI Resumed at discharge  noninsulin dependent dm2, with hyperglycemia a1c in 02/2019 was 8 Am blood glucose is 246 On ssi in the hospital Home meds metformin/glipizide/actos held in the hospital, resumed at discharge continue statin   Elevated PSA Case discussed with urology on call Dr Matilde Sprang per daughter's request. Per [name], patient needs to reschedule urology appoint till 3-4 weeks out from acute PE  FTT/ progressive dementia/significant weight loss -per daughter patient has been very sedentary, mostly in bed or in chair, most of the days -PT eval recommend home health -Palliative care consulted for goals of care , recommend continue goals of care discussion and follow up with community palliative care services   Code Status: full  Family Communication: patient in room, daughter over the phone  Disposition Plan:  home with home o2 and home health Pt/RN/Aids/socail worker   Consultants:  Critical care  Palliative care  Procedures:  none  Antibiotics:  none   Discharge Exam: BP 115/82 (BP Location: Right Arm)    Pulse 79    Temp 98.5 F (36.9 C) (Oral)    Resp 16    Ht 5' 11" (1.803 m)    Wt 75.9 kg    SpO2 97%    BMI 23.33 kg/m   General: NAD, pleasantly demented, not oriented to time Cardiovascular: RRR Respiratory: CTABL  Discharge Instructions You were cared for by a hospitalist during your hospital stay. If you have any questions about your discharge medications or the care you received while you were in the hospital after you are discharged, you can call the unit and asked to speak with the  hospitalist on call if the hospitalist that took care of you is not available. Once you are discharged, your primary care physician will handle any further medical issues. Please note that NO REFILLS for any discharge medications will be authorized once you are discharged, as it is imperative that you return to your primary care physician (or establish a relationship with a primary care physician if you do not have one) for your aftercare needs so that they can reassess your need for medications and monitor your lab values.  Discharge Instructions    Diet - low sodium heart healthy   Complete by: As directed    Carb modified diet   Increase activity slowly   Complete by: As directed    Increase activity slowly   Complete by: As directed      Allergies as of 04/29/2019   No Known Allergies     Medication List    TAKE these medications   Blood Glucose System Pak Kit Please dispense based on patient and insurance preference. Use as directed to monitor FSBS 2x. Dx: E11.E11.65.   BLOOD GLUCOSE TEST STRIPS Strp Please dispense one touch verio flex lancets, and testing strips. Use as directed to monitor  FSBS 2x. Dx: E11.E11.65.   donepezil 5 MG tablet Commonly known as: ARICEPT TAKE 1 TABLET BY MOUTH AT BEDTIME   Eliquis DVT/PE Starter Pack 5 MG Tabs Take as directed on package: start with two-31m tablets twice daily for 7 days. On day 8, switch to one-538mtablet twice daily.   glipiZIDE 10 MG 24 hr tablet Commonly known as: GLUCOTROL XL Take 1 tablet (10 mg total) by mouth daily with breakfast.   Lancets Misc Please dispense based on patient and insurance preference. Use as directed to monitor FSBS 2x. Dx: E11.E11.65.   Linzess 290 MCG Caps capsule Generic drug: linaclotide Take 290 mcg by mouth daily before breakfast.   lisinopril 10 MG tablet Commonly known as: ZESTRIL Take 1 tablet by mouth once daily   metFORMIN 500 MG tablet Commonly known as: GLUCOPHAGE TAKE 2 TABLETS  BY MOUTH TWICE DAILY WITH A MEAL What changed:   how much to take  how to take this  when to take this  additional instructions   pioglitazone 30 MG tablet Commonly known as: ACTOS TAKE 1 TABLET BY MOUTH ONCE DAILY   pravastatin 40 MG tablet Commonly known as: PRAVACHOL TAKE 1 TABLET BY MOUTH ONCE DAILY            Durable Medical Equipment  (From admission, onward)         Start     Ordered   04/29/19 0950  DME Oxygen  Once    Question Answer Comment  Length of Need Lifetime   Mode or (Route) Nasal cannula   Liters per Minute 2   Frequency Continuous (stationary and portable oxygen unit needed)   Oxygen conserving device Yes   Oxygen delivery system Gas      04/29/19 0950         No Known Allergies Follow-up Information    PiSusy FrizzleMD Follow up in 1 week(s).   Specialty: Family Medicine Why: hospital discharge follow up, repeat cbc/bmp at follow up  Contact information: [address] [phone]        [facility] Follow up.   Why: for elevated psa Contact information: [phone]         The results of significant diagnostics from this hospitalization (including imaging, microbiology, ancillary and laboratory) are listed below for reference.    Significant Diagnostic Studies: Ct Angio Chest Pe W And/or Wo Contrast  Result Date: 04/26/2019 CLINICAL DATA:  PE suspected, shortness of breath, dry cough EXAM: CT ANGIOGRAPHY CHEST WITH CONTRAST TECHNIQUE: Multidetector CT imaging of the chest was performed using the standard protocol during bolus administration of intravenous contrast. Multiplanar CT image reconstructions and MIPs were obtained to evaluate the vascular anatomy. CONTRAST:  6843mMNIPAQUE IOHEXOL 350 MG/ML SOLN COMPARISON:  None. FINDINGS: Cardiovascular: Satisfactory opacification of the pulmonary arteries to the  segmental level. Positive examination for pulmonary embolism with extensive bilateral lobar to segmental embolus in all lobes. The RV LV ratio is elevated at approximately 1.6:1. Normal heart size. No pericardial effusion. Mediastinum/Nodes: No enlarged mediastinal, hilar, or axillary lymph nodes. Thyroid gland, trachea, and esophagus demonstrate no significant findings. Lungs/Pleura: Evaluation of the lung parenchyma is limited by breath motion artifact. Scattered ground-glass opacities, for example in the apical left upper lobe (series 11, image 35). No pleural effusion or pneumothorax. Upper Abdomen: No acute abnormality. Musculoskeletal: No chest wall abnormality. No acute or significant  osseous findings. Review of the MIP images confirms the above findings. IMPRESSION: 1. Positive examination for pulmonary embolism with extensive bilateral lobar to segmental embolus in all lobes. 2. The RV LV ratio is elevated at approximately 1.6:1, concerning for right heart strain. Correlate with echocardiographic findings. 3. Scattered ground-glass opacities, for example in the apical left upper lobe (series 11, image 35). These are nonspecific and likely infectious or inflammatory and do not particularly appear to be in a distribution distal to embolus or in a configuration suggesting developing pulmonary infarction. These results were called by telephone at the time of interpretation on 04/26/2019 at 2:31 pm to Dr. Dorie Rank , who verbally acknowledged these results. Electronically Signed   By: Nicandro Candle M.D.   On: 04/26/2019 14:31   Dg Chest Port 1 View  Result Date: 04/26/2019 CLINICAL DATA:  Shortness of breath, cough EXAM: PORTABLE CHEST 1 VIEW COMPARISON:  None. FINDINGS: Heart and mediastinal contours are within normal limits. No focal opacities or effusions. No acute bony abnormality. IMPRESSION: No active disease. Electronically Signed   By: Rolm Baptise M.D.   On: 04/26/2019 10:14   Vas [place] Lower Extremity  Venous (dvt)  Result Date: 04/28/2019  Lower Venous Study Indications: Pulmonary embolism.  Risk Factors: Confirmed PE. Comparison Study: No prior studies. Performing Technologist: Oliver Hum RVT  Examination Guidelines: A complete evaluation includes B-mode imaging, spectral Doppler, color Doppler, and power Doppler as needed of all accessible portions of each vessel. Bilateral testing is considered an integral part of a complete examination. Limited examinations for reoccurring indications may be performed as noted.  +---------+---------------+---------+-----------+----------+-------+  RIGHT     Compressibility Phasicity Spontaneity Properties Summary  +---------+---------------+---------+-----------+----------+-------+  CFV       Full            Yes       Yes                             +---------+---------------+---------+-----------+----------+-------+  SFJ       Full                                                      +---------+---------------+---------+-----------+----------+-------+  FV Prox   Partial         No        No                     Acute    +---------+---------------+---------+-----------+----------+-------+  FV Mid    Full                                                      +---------+---------------+---------+-----------+----------+-------+  FV Distal Full                                                      +---------+---------------+---------+-----------+----------+-------+  PFV       Full                                                      +---------+---------------+---------+-----------+----------+-------+  POP       Full            Yes       Yes                             +---------+---------------+---------+-----------+----------+-------+  PTV       Full                                                      +---------+---------------+---------+-----------+----------+-------+  PERO      Full                                                       +---------+---------------+---------+-----------+----------+-------+   +---------+---------------+---------+-----------+----------+-------+  LEFT      Compressibility Phasicity Spontaneity Properties Summary  +---------+---------------+---------+-----------+----------+-------+  CFV       Full            Yes       Yes                             +---------+---------------+---------+-----------+----------+-------+  SFJ       Full                                                      +---------+---------------+---------+-----------+----------+-------+  FV Prox   Full                                                      +---------+---------------+---------+-----------+----------+-------+  FV Mid    Full                                                      +---------+---------------+---------+-----------+----------+-------+  FV Distal Full                                                      +---------+---------------+---------+-----------+----------+-------+  PFV       Full                                                      +---------+---------------+---------+-----------+----------+-------+  POP       Full            Yes       Yes                             +---------+---------------+---------+-----------+----------+-------+  PTV       Full                                                      +---------+---------------+---------+-----------+----------+-------+  PERO      Full                                                      +---------+---------------+---------+-----------+----------+-------+  SSV       None                                             Acute    +---------+---------------+---------+-----------+----------+-------+     Summary: Right: Findings consistent with acute deep vein thrombosis involving the right femoral vein. No cystic structure found in the popliteal fossa. Left: Findings consistent with acute superficial vein thrombosis involving the left small saphenous vein. There is no evidence of deep  vein thrombosis in the lower extremity. No cystic structure found in the popliteal fossa.  *See table(s) above for measurements and observations. Electronically signed by Monica Martinez MD on 04/28/2019 at 5:42:27 PM.    Final     Microbiology: Recent Results (from the past 240 hour(s))  SARS Coronavirus 2 [facility] order, Performed in [facility] hospital lab)     Status: None   Collection Time: 04/26/19 10:00 AM   Specimen: Nasopharyngeal Swab  Result Value Ref Range Status   SARS Coronavirus 2 NEGATIVE NEGATIVE Final    Comment: (NOTE) If result is NEGATIVE SARS-CoV-2 target nucleic acids are NOT DETECTED. The SARS-CoV-2 RNA is generally detectable in upper and lower  respiratory specimens during the acute phase of infection. The lowest  concentration of SARS-CoV-2 viral copies this assay can detect is 250  copies / mL. A negative result does not preclude SARS-CoV-2 infection  and should not be used as the sole basis for treatment or other  patient management decisions.  A negative result may occur with  improper specimen collection / handling, submission of specimen other  than nasopharyngeal swab, presence of viral mutation(s) within the  areas targeted by this assay, and inadequate number of viral copies  (<250 copies / mL). A negative result must be combined with clinical  observations, patient history, and epidemiological information. If result is POSITIVE SARS-CoV-2 target nucleic acids are DETECTED. The SARS-CoV-2 RNA is generally detectable in upper and lower  respiratory specimens dur ing the acute phase of infection.  Positive  results are indicative of active infection with SARS-CoV-2.  Clinical  correlation with patient history and other diagnostic information is  necessary to determine patient infection status.  Positive results do  not rule out bacterial infection or co-infection with other viruses. If result is PRESUMPTIVE POSTIVE SARS-CoV-2 nucleic acids MAY BE  PRESENT.   A presumptive positive result was obtained on the submitted specimen  and confirmed on repeat testing.  While 2019 novel coronavirus  (SARS-CoV-2) nucleic acids may be present in the submitted sample  additional confirmatory testing may be necessary for epidemiological  and / or clinical management purposes  to differentiate  between  SARS-CoV-2 and other Sarbecovirus currently known to infect humans.  If clinically indicated additional testing with an alternate test  methodology [phone]) is advised. The SARS-CoV-2 RNA is generally  detectable in upper and lower respiratory sp ecimens during the acute  phase of infection. The expected result is Negative. Fact Sheet for Patients:  StrictlyIdeas.no Fact Sheet for Healthcare Providers: BankingDealers.co.za This test is not yet approved or cleared by the [place] FDA and has been authorized for detection and/or diagnosis of SARS-CoV-2 by FDA under an Emergency Use Authorization (EUA).  This EUA will remain in effect (meaning this test can be used) for the duration of the COVID-19 declaration under Section 564(b)(1) of the Act, 21 U.S.C. section 360bbb-3(b)(1), unless the authorization is terminated or revoked sooner. Performed at [facility], [place] [address] [place], [place] [address]   Blood Culture (routine x 2)     Status: None (Preliminary result)   Collection Time: 04/26/19 10:00 AM   Specimen: BLOOD LEFT FOREARM  Result Value Ref Range Status   Specimen Description   Final    BLOOD LEFT FOREARM Performed at [facility], [place] [address], [place], [place] [address]    Special Requests   Final    BOTTLES DRAWN AEROBIC AND ANAEROBIC Blood Culture results may not be optimal due to an excessive volume of blood received in culture bottles Performed at [place] [address] [place], [place] [address]    Culture   Final     NO GROWTH 2 DAYS Performed at [facility] [address] [place], [place] [address]    Report Status PENDING  Incomplete  Blood Culture (routine x 2)     Status: None (Preliminary result)   Collection Time: 04/26/19 10:05 AM   Specimen: BLOOD  Result Value Ref Range Status   Specimen Description   Final    BLOOD LEFT ANTECUBITAL Performed at [place] [address], [place], [place] [address]    Special Requests   Final    BOTTLES DRAWN AEROBIC AND ANAEROBIC Blood Culture adequate volume Performed at [facility] [address], [place], [place] [address]    Culture   Final    NO GROWTH 2 DAYS Performed at [facility] [address] [place], [place] [address]    Report Status PENDING  Incomplete  MRSA PCR Screening     Status: None   Collection Time: 04/26/19  4:34 PM   Specimen: Nasal Mucosa; Nasopharyngeal  Result Value Ref Range Status   MRSA by PCR NEGATIVE NEGATIVE Final    Comment:        The GeneXpert MRSA Assay (FDA approved for NASAL specimens only), is one component of a comprehensive MRSA colonization surveillance program. It is not intended to diagnose MRSA infection nor to guide or monitor treatment for MRSA infections. Performed at [facility], [place] [address], [place], [place] [address]      Labs: Basic Metabolic Panel: Recent Labs  Lab 04/26/19 1000 04/26/19 2046 04/27/19 0524 04/28/19 0215 04/29/19 0513  NA 136  --  137 139 137  K 4.6  --  4.4 3.8 3.7  CL 103  --  104 107 104  CO2 21*  --  24 20* 23  GLUCOSE 321*  --  246* 213* 206*  BUN 16  --  _0 CREATININE 1.25*  --  1.06 0.96 1.04  CALCIUM 9.2  --  8.9 8.6* 8.4*  MG  --  1.8  --  1.6* 1.8   Liver Function Tests: Recent Labs  Lab 04/26/19 1000 04/27/19 0524  AST 15 13*  ALT 12 10  ALKPHOS 55 48  BILITOT 0.8 0.7  PROT 7.2 6.5  ALBUMIN 4.2 3.7   No results for input(s): LIPASE, AMYLASE in  the last 168 hours. No results for input(s): AMMONIA in the last 168 hours. CBC: Recent Labs  Lab 04/26/19 1000 04/27/19 0524 04/28/19 0215 04/29/19 0513  WBC 10.4 11.6* 11.4* 7.7  NEUTROABS 7.2  --   --   --   HGB 14.2 12.8* 12.6* 11.0*  HCT 42.8 38.8* 37.3* 34.4*  MCV 92.0 92.2 91.6 92.5  PLT 181 170 144* 180   Cardiac Enzymes: No results for input(s): CKTOTAL, CKMB, CKMBINDEX, TROPONINI in the last 168 hours. BNP: BNP (last 3 results) Recent Labs    04/26/19 1005  BNP 474.4*    ProBNP (last 3 results) No results for input(s): PROBNP in the last 8760 hours.  CBG: Recent Labs  Lab 04/28/19 0806 04/28/19 1151 04/28/19 1719 04/28/19 2212 04/29/19 0738  GLUCAP 219* 255* 172* 174* 199*       Signed:  Florencia Reasons MD, PhD  [facility] 04/29/2019, 10:00 AM

## 2019-04-29 NOTE — Plan of Care (Signed)
Patient is in bed resting currently. All belongings within reach. Pt is forgetful bed alarm set. Pt will call for assistance when needing to get out of the bed to assist in safety.

## 2019-04-29 NOTE — Care Management Important Message (Signed)
Important Message  Patient Details IM Letter given to Dessa Phi RN to present to the Patient Name: Ryan Gilmore MRN: [id number] Date of Birth: 1933-02-27   Medicare Important Message Given:  Yes     Kerin Salen 04/29/2019, 10:24 AM

## 2019-04-29 NOTE — Progress Notes (Signed)
SATURATION QUALIFICATIONS:   Patient Saturations on Room Air at Rest = 90 %  Patient Saturations on Room Air while Ambulating = 88 %  Patient Saturations on   2  Liters of oxygen while Ambulating =  98 %  Please briefly explain why patient needs home oxygen: Patient walked 90 ft. Activity tolerated well. Oxygen saturation decreased 88 % while walking. Pt did not complain of SOB, assessment RR increased 22-24. 2 lt O2 [place]  applied sat increased 94-98% while ambulating

## 2019-04-29 NOTE — Progress Notes (Addendum)
[facility] for Heparin transition to Apixaban  Indication: pulmonary embolus  No Known Allergies  Patient Measurements: Height: 5\' 11"  (180.3 cm) Weight: 167 lb 4.8 oz (75.9 kg) IBW/kg (Calculated) : 75.3 Heparin Dosing Weight: 739 kg  Vital Signs: Temp: 98.5 F (36.9 C) (07/24 0555) Temp Source: Oral (07/24 0555) BP: 115/82 (07/24 0555) Pulse Rate: 79 (07/24 0555)  Labs: Recent Labs    04/27/19 0524 04/28/19 0215 04/29/19 0513  HGB 12.8* 12.6* 11.0*  HCT 38.8* 37.3* 34.4*  PLT 170 144* 180  LABPROT 14.7 15.7* 14.5  INR 1.2 1.3* 1.1  HEPARINUNFRC 0.39 0.42 0.16*  CREATININE 1.06 0.96 1.04   Estimated Creatinine Clearance: 57.3 mL/min (by C-G formula based on SCr of 1.04 mg/dL).  Assessment: 67 yoM with PMH HTN, HLD, dementia, DM2 sent from PCPs office for hypoxia. D-dimer and BNP elevated; found to have bilateral PE with RH strain on CT. Pharmacy to dose heparin.   Baseline INR WNL, aPTT not done  Prior anticoagulation: none  Significant events:  Today, 04/29/2019:  CBC: Hgb 12.6; Plt sl decr 144  Hep level 0.42 units/ml, in therapeutic range  No bleeding or infusion issues per nursing  Transition to Apixaban today  Goal of Therapy: Heparin level 0.3-0.7 units/ml Monitor platelets by anticoagulation protocol: Yes  Plan:  Discontinue Heparin infusion at 10am  Apixaban 10mg  bid x 7 days, then dose change to 5mg  bid on 7/31  Monitor for signs of bleeding or thrombosis  Apixaban Education  Minda Ditto PharmD 04/29/2019, 7:46 AM

## 2019-04-30 DIAGNOSIS — I129 Hypertensive chronic kidney disease with stage 1 through stage 4 chronic kidney disease, or unspecified chronic kidney disease: Secondary | ICD-10-CM | POA: Diagnosis not present

## 2019-04-30 DIAGNOSIS — I2699 Other pulmonary embolism without acute cor pulmonale: Secondary | ICD-10-CM | POA: Diagnosis not present

## 2019-04-30 DIAGNOSIS — I82411 Acute embolism and thrombosis of right femoral vein: Secondary | ICD-10-CM | POA: Diagnosis not present

## 2019-04-30 DIAGNOSIS — J9621 Acute and chronic respiratory failure with hypoxia: Secondary | ICD-10-CM | POA: Diagnosis not present

## 2019-04-30 DIAGNOSIS — R627 Adult failure to thrive: Secondary | ICD-10-CM | POA: Diagnosis not present

## 2019-04-30 DIAGNOSIS — N179 Acute kidney failure, unspecified: Secondary | ICD-10-CM | POA: Diagnosis not present

## 2019-04-30 DIAGNOSIS — F0150 Vascular dementia without behavioral disturbance: Secondary | ICD-10-CM | POA: Diagnosis not present

## 2019-04-30 DIAGNOSIS — E1165 Type 2 diabetes mellitus with hyperglycemia: Secondary | ICD-10-CM | POA: Diagnosis not present

## 2019-04-30 DIAGNOSIS — I82812 Embolism and thrombosis of superficial veins of left lower extremities: Secondary | ICD-10-CM | POA: Diagnosis not present

## 2019-05-01 LAB — CULTURE, BLOOD (ROUTINE X 2)
Culture: NO GROWTH
Culture: NO GROWTH
Special Requests: ADEQUATE

## 2019-05-02 DIAGNOSIS — I82411 Acute embolism and thrombosis of right femoral vein: Secondary | ICD-10-CM | POA: Diagnosis not present

## 2019-05-02 DIAGNOSIS — J9621 Acute and chronic respiratory failure with hypoxia: Secondary | ICD-10-CM | POA: Diagnosis not present

## 2019-05-02 DIAGNOSIS — E1165 Type 2 diabetes mellitus with hyperglycemia: Secondary | ICD-10-CM | POA: Diagnosis not present

## 2019-05-02 DIAGNOSIS — I2699 Other pulmonary embolism without acute cor pulmonale: Secondary | ICD-10-CM | POA: Diagnosis not present

## 2019-05-02 DIAGNOSIS — R627 Adult failure to thrive: Secondary | ICD-10-CM | POA: Diagnosis not present

## 2019-05-02 DIAGNOSIS — N179 Acute kidney failure, unspecified: Secondary | ICD-10-CM | POA: Diagnosis not present

## 2019-05-02 DIAGNOSIS — I82812 Embolism and thrombosis of superficial veins of left lower extremities: Secondary | ICD-10-CM | POA: Diagnosis not present

## 2019-05-02 DIAGNOSIS — I129 Hypertensive chronic kidney disease with stage 1 through stage 4 chronic kidney disease, or unspecified chronic kidney disease: Secondary | ICD-10-CM | POA: Diagnosis not present

## 2019-05-02 DIAGNOSIS — F0150 Vascular dementia without behavioral disturbance: Secondary | ICD-10-CM | POA: Diagnosis not present

## 2019-05-04 DIAGNOSIS — I82411 Acute embolism and thrombosis of right femoral vein: Secondary | ICD-10-CM | POA: Diagnosis not present

## 2019-05-04 DIAGNOSIS — J9621 Acute and chronic respiratory failure with hypoxia: Secondary | ICD-10-CM | POA: Diagnosis not present

## 2019-05-04 DIAGNOSIS — E1165 Type 2 diabetes mellitus with hyperglycemia: Secondary | ICD-10-CM | POA: Diagnosis not present

## 2019-05-04 DIAGNOSIS — I129 Hypertensive chronic kidney disease with stage 1 through stage 4 chronic kidney disease, or unspecified chronic kidney disease: Secondary | ICD-10-CM | POA: Diagnosis not present

## 2019-05-04 DIAGNOSIS — I2699 Other pulmonary embolism without acute cor pulmonale: Secondary | ICD-10-CM | POA: Diagnosis not present

## 2019-05-04 DIAGNOSIS — F0150 Vascular dementia without behavioral disturbance: Secondary | ICD-10-CM | POA: Diagnosis not present

## 2019-05-04 DIAGNOSIS — R627 Adult failure to thrive: Secondary | ICD-10-CM | POA: Diagnosis not present

## 2019-05-04 DIAGNOSIS — I82812 Embolism and thrombosis of superficial veins of left lower extremities: Secondary | ICD-10-CM | POA: Diagnosis not present

## 2019-05-04 DIAGNOSIS — N179 Acute kidney failure, unspecified: Secondary | ICD-10-CM | POA: Diagnosis not present

## 2019-05-05 ENCOUNTER — Telehealth: Payer: Self-pay | Admitting: Nurse Practitioner

## 2019-05-05 NOTE — Telephone Encounter (Signed)
Called patient's home number [phone] but call would not go through.  I then called wife's cell number and spoke to her regarding Palliative services.  She stated that she will have her daughter Cam call me back to schedule the Consult, I gave her my name and contact number.

## 2019-05-06 ENCOUNTER — Other Ambulatory Visit: Payer: Self-pay

## 2019-05-06 ENCOUNTER — Ambulatory Visit (INDEPENDENT_AMBULATORY_CARE_PROVIDER_SITE_OTHER): Payer: Medicare HMO | Admitting: Family Medicine

## 2019-05-06 VITALS — BP 112/60 | HR 92 | Temp 97.9°F | Resp 12 | Ht 70.5 in | Wt 161.0 lb

## 2019-05-06 DIAGNOSIS — E1165 Type 2 diabetes mellitus with hyperglycemia: Secondary | ICD-10-CM | POA: Diagnosis not present

## 2019-05-06 DIAGNOSIS — N179 Acute kidney failure, unspecified: Secondary | ICD-10-CM | POA: Diagnosis not present

## 2019-05-06 DIAGNOSIS — I82812 Embolism and thrombosis of superficial veins of left lower extremities: Secondary | ICD-10-CM | POA: Diagnosis not present

## 2019-05-06 DIAGNOSIS — R972 Elevated prostate specific antigen [PSA]: Secondary | ICD-10-CM | POA: Diagnosis not present

## 2019-05-06 DIAGNOSIS — E118 Type 2 diabetes mellitus with unspecified complications: Secondary | ICD-10-CM

## 2019-05-06 DIAGNOSIS — I2699 Other pulmonary embolism without acute cor pulmonale: Secondary | ICD-10-CM | POA: Diagnosis not present

## 2019-05-06 DIAGNOSIS — R0902 Hypoxemia: Secondary | ICD-10-CM | POA: Diagnosis not present

## 2019-05-06 DIAGNOSIS — I129 Hypertensive chronic kidney disease with stage 1 through stage 4 chronic kidney disease, or unspecified chronic kidney disease: Secondary | ICD-10-CM | POA: Diagnosis not present

## 2019-05-06 DIAGNOSIS — J9621 Acute and chronic respiratory failure with hypoxia: Secondary | ICD-10-CM | POA: Diagnosis not present

## 2019-05-06 DIAGNOSIS — IMO0002 Reserved for concepts with insufficient information to code with codable children: Secondary | ICD-10-CM

## 2019-05-06 DIAGNOSIS — F0390 Unspecified dementia without behavioral disturbance: Secondary | ICD-10-CM | POA: Diagnosis not present

## 2019-05-06 DIAGNOSIS — F0150 Vascular dementia without behavioral disturbance: Secondary | ICD-10-CM | POA: Diagnosis not present

## 2019-05-06 DIAGNOSIS — I82411 Acute embolism and thrombosis of right femoral vein: Secondary | ICD-10-CM | POA: Diagnosis not present

## 2019-05-06 DIAGNOSIS — R627 Adult failure to thrive: Secondary | ICD-10-CM | POA: Diagnosis not present

## 2019-05-06 MED ORDER — SITAGLIPTIN PHOSPHATE 100 MG PO TABS: 100.0000 mg | ORAL_TABLET | Freq: Every day | ORAL | 3 refills | Status: DC

## 2019-05-06 NOTE — Progress Notes (Signed)
Subjective:    Patient ID: Ryan Gilmore, male    DOB: 07/03/1933, 85 y.o.   MRN: [id number]  HPI  Please see my previous office visit.  The patient was referred to the emergency room due to hypoxia and respiratory distress.  Initially I felt that the patient may have pneumonia however evaluation in the emergency room revealed pulmonary embolism as Gilmore source of his respiratory distress.  I have copied relevant portions of the discharge summary below for my reference: Admit date: 04/26/2019 Discharge date: 04/29/2019  Time spent: 77mns, more than 50% time spent on coordination of care.  Recommendations for Outpatient Follow-up:  1. F/u with PCP within Gilmore week  for hospital discharge follow up, repeat cbc/bmp at follow up 2. Home health, home 02 3. Case manager notified to arrange Community palliative care  Discharge Diagnoses:      Active Hospital Problems   Diagnosis Date Noted  . Palliative care by specialist   . DNR (do not resuscitate) discussion   . Acute hypoxemic respiratory failure ([place]   . Acute on chronic respiratory failure with hypoxia ([facility]   . Vascular dementia without behavioral disturbance ([facility] [place]   . FTT (failure to thrive) in adult   . Acute pulmonary embolism without acute cor pulmonale ([place] 04/26/2019  . Essential hypertension 04/26/2019  . AKI (acute kidney injury) ([place] 04/26/2019  . Diabetes mellitus type 2, noninsulin dependent ([place] 06/28/2013    Resolved Hospital Problems  No resolved problems to display.    Discharge Condition: stable  Diet recommendation: heart healthy/carb modified       Filed Weights   04/26/19 1650 04/28/19 0503 04/29/19 0601  Weight: 74.5 kg 72.8 kg 75.9 kg    History of present illness: (per admitting MD Dr Ryan Gilmore:PZWCHEN WCammie [name] MD Patient coming from:Home/PCP office  I have personally briefly reviewed patient's old medical records [place] Chief Complaint:No complaint from  patient/hypoxia at PCP office  HIDP:OEUMPW Ryan Gilmore 85 y.o.malewith medical history significant ofhypertension, hyperlipidemia, dementia and type 2 diabetes mellitus was sent to the emergency department from his PCPs office for further evaluation of hypoxia. Reportedly, patient went to see his PCP along with his wife for routine checkup however he was found to be hypoxic with saturation about 80% on room air so he was sent to the emergency department. Patient has severe dementia to the point that he does not know why he is in the hospital. When I talked to him, he did not even recall that he saw his PCP today. I called patient's wife on the phone and she verified that patient has not been having any complaints such as chest pain, shortness of breath, fever, chills, sweating, nausea, vomiting, any problem with urination or with bowel movement.   ED Course:Upon arrival to the emergency department, patient was slightly tachypneic and tachycardic however blood pressure was within normal limits. He was requiring 3 L of oxygen to keep his oxygen saturation more than 90%. Initial work-up was directed toward possible COVID-19 infection. Inflammatory markers were obtained and half of them are elevated. Subsequently CT angiogram of the chest was done and he was diagnosed with heavy load of bilateral PE with right heart strain. He was started on heparin and hospital service was consulted to admit the patient for further management.   Hospital Course:  Active Problems:   Diabetes mellitus type 2, noninsulin dependent ([facility]   Acute pulmonary embolism without acute cor pulmonale (HCC)  Essential hypertension   AKI (acute kidney injury) ([place])   Acute on chronic respiratory failure with hypoxia (HCC)   Vascular dementia without behavioral disturbance (HCC)   FTT (failure to thrive) in adult   Palliative care by specialist   DNR (do not resuscitate) discussion   Acute hypoxemic respiratory  failure ([place])   Acute bilateral DVT/Acute bilateral PE/ right heart strain /acute hypoxic respiratory failure -per daughter patient has been very sedentary recently -echo lvef wnl,There is right ventricular volume and pressure overload. Right ventricular systolic pressure is severely elevated with an estimated pressure of 74.2 mmHg -critical care input appreciated, no plan for thrombolysis --he received heparin dripx48hrs, then transition to Eliquis, case manager notified for eliquis meds assistance  -need home 02  AKI on CKD II, improved  Cr 1.25 on presentation Appear dehydrated, he received hydration, Cr improved , today is0.96  Hypomagnesemia:replaced, improved   HTN: Home meds lisinopril held for now due to acute PE and AKI Resumed at discharge  noninsulin dependent dm2, with hyperglycemia a1c in 02/2019 was 8 Am blood glucose is 246 On ssi in the hospital Home meds metformin/glipizide/actos held in the hospital, resumed at discharge continue statin   Elevated PSA Case discussed with urology on call Dr Ryan Gilmore per daughter's request. Per Ryan Gilmore, patient needs to reschedule urology appoint till 3-4 weeks out from acute PE  FTT/ progressive dementia/significant weight loss -per daughter patient has been very sedentary, mostly in bed or in chair, most of the days -PT evalrecommend home health -Palliative care consulted for goals of care, recommend continue goals of care discussion and follow up with community palliative care services  Code Status:full  Family Communication:patient in room, daughter over the phone  Disposition Plan: home with home o2 and home health Pt/RN/Aids/socail worker   05/06/19 Patient is here today with his wife and his daughter.  Although very pleasant, the patient is not able to contribute to the history.  He denies any complaints today.  He denies any shortness of breath.  He is not wearing his oxygen.  However he  is 95% on room air.  His wife who has mild dementia forgot his oxygen at home.  She had also forgotten today's appointment until reminded by her daughter.  I am increasingly concerned about his wife's capacity to care for him and herself although at the present time I feel that they are safe to be at home together.  His daughter has taken over managing his medication.  He has been using glipizide, metformin, and Actos.  However his fasting blood sugar is between 270 and 300 and his 2-hour postprandial sugar is in the high 200s to 300 range since home from the hospital.  Wife is not checking his blood sugar because she is forgetting to.  He cannot tolerate Trulicity due to nausea.  He is Gilmore very poor insulin candidate due to his dementia and his wife's dementia however he needs assistance in managing his blood sugar.  They have been taking Eliquis and have transitioned to 5 mg twice Gilmore day.  They are able to afford this medication.  Wife denies any bleeding or bruising.  Patient denies any leg pain or dyspnea on exertion or pleurisy Past Medical History:  Diagnosis Date  . Bleeding duodenal ulcer    ?  1980s  . Dementia ([place])   . Diabetes mellitus without complication ([place])   . Hyperlipidemia    Past Surgical History:  Procedure Laterality Date  . APPENDECTOMY  Current Outpatient Medications on File Prior to Visit  Medication Sig Dispense Refill  . Blood Glucose Monitoring Suppl (BLOOD GLUCOSE SYSTEM PAK) KIT Please dispense based on patient and insurance preference. Use as directed to monitor FSBS 2x. Dx: E11.E11.65. 1 kit 1  . donepezil (ARICEPT) 5 MG tablet TAKE 1 TABLET BY MOUTH AT BEDTIME (Patient taking differently: Take 5 mg by mouth at bedtime. ) 90 tablet 3  . Eliquis DVT/PE Starter Pack (ELIQUIS STARTER PACK) 5 MG TABS Take as directed on package: start with two-77m tablets twice daily for 7 days. On day 8, switch to one-571mtablet twice daily. 1 each 0  . glipiZIDE (GLUCOTROL XL) 10 MG  24 hr tablet Take 1 tablet (10 mg total) by mouth daily with breakfast. 90 tablet 1  . Glucose Blood (BLOOD GLUCOSE TEST STRIPS) STRP Please dispense one touch verio flex lancets, and testing strips. Use as directed to monitor FSBS 2x. Dx: [id number] strip 11  . Lancets MISC Please dispense based on patient and insurance preference. Use as directed to monitor FSBS 2x. Dx: E11.E11.65. 100 each 3  . linaclotide (LINZESS) 290 MCG CAPS capsule Take 290 mcg by mouth daily before breakfast.    . lisinopril (ZESTRIL) 10 MG tablet Take 1 tablet by mouth once daily 90 tablet 0  . metFORMIN (GLUCOPHAGE) 500 MG tablet TAKE 2 TABLETS BY MOUTH TWICE DAILY WITH Gilmore MEAL 360 tablet 1  . pioglitazone (ACTOS) 30 MG tablet TAKE 1 TABLET BY MOUTH ONCE DAILY 90 tablet 1  . pravastatin (PRAVACHOL) 40 MG tablet TAKE 1 TABLET BY MOUTH ONCE DAILY 90 tablet 2   No current facility-administered medications on file prior to visit.    No Known Allergies Social History   Socioeconomic History  . Marital status: Married    Spouse name: Not on file  . Number of children: Not on file  . Years of education: Not on file  . Highest education level: Not on file  Occupational History  . Not on file  Social Needs  . Financial resource strain: Not on file  . Food insecurity    Worry: Not on file    Inability: Not on file  . Transportation needs    Medical: Not on file    Non-medical: Not on file  Tobacco Use  . Smoking status: Never Smoker  . Smokeless tobacco: Current User    Types: Chew  Substance and Sexual Activity  . Alcohol use: Not Currently  . Drug use: Never  . Sexual activity: Not on file  Lifestyle  . Physical activity    Days per week: Not on file    Minutes per session: Not on file  . Stress: Not on file  Relationships  . Social coHerbalistn phone: Not on file    Gets together: Not on file    Attends religious service: Not on file    Active member of club or organization: Not on  file    Attends meetings of clubs or organizations: Not on file    Relationship status: Not on file  . Intimate partner violence    Fear of current or ex partner: Not on file    Emotionally abused: Not on file    Physically abused: Not on file    Forced sexual activity: Not on file  Other Topics Concern  . Not on file  Social History Narrative  . Not on file     Review of Systems  All other systems reviewed and are negative.      Objective:   Physical Exam Constitutional:      General: He is not in acute distress.    Appearance: Normal appearance. He is not ill-appearing, toxic-appearing or diaphoretic.  Cardiovascular:     Rate and Rhythm: Normal rate and regular rhythm.     Heart sounds: Normal heart sounds. No murmur. No friction rub. No gallop.   Pulmonary:     Effort: Pulmonary effort is normal. No respiratory distress.     Breath sounds: Normal breath sounds. No stridor. No wheezing or rhonchi.  Musculoskeletal:     Right lower leg: No edema.     Left lower leg: No edema.  Neurological:     Mental Status: He is alert.           Assessment & Plan:  1. Pulmonary embolism and infarction (HCC) Continue Eliquis 5 mg twice daily for at least 6 months.  I believe his pulmonary embolism is likely due to Gilmore DVT caused by his sedentary lifestyle however given his elevated PSA I am also concerned about an underlying malignancy causing Gilmore hypercoagulable state.  Therefore we may need to continue this medication indefinitely if the malignancy cannot be definitively treated - CBC with Differential/Platelet - BASIC METABOLIC PANEL WITH GFR  2. Hypoxia Patient is 95% on room air at rest however I suspect hypoxia with exercise.  Therefore I recommended oxygen whenever he is ambulating and I will reassess the patient's oxygen saturation in 1 month 3. Elevated PSA I suspect underlying prostate cancer.  Patient has an appointment to see urology in August.  I will await urology's  input.  However I would recommend that we continue his anticoagulant for at least 3 months prior to stopping it for any planned biopsy given the right ventricular strain that was seen on echocardiogram and the bilateral low burden seen on CT.  Ideally I would like to continue anticoagulation for at least 6 months.  4. Uncontrolled type 2 diabetes mellitus with complication, without long-term current use of insulin (HCC) Due to the patient's dementia and failure to thrive and fluctuating appetite and his wife's dementia is going to be difficult to control his blood sugars.  Therefore if I can get his blood sugars under 200 I will be extremely happy with this.  I believe the safest option is to add Januvia 100 mg daily to what he is taking and then see how his fasting blood sugars are performing in 1 month.

## 2019-05-07 LAB — CBC WITH DIFFERENTIAL/PLATELET
Absolute Monocytes: 630 cells/uL (ref 200–950)
Basophils Absolute: 50 cells/uL (ref 0–200)
Basophils Relative: 0.6 %
Eosinophils Absolute: 218 cells/uL (ref 15–500)
Eosinophils Relative: 2.6 %
HCT: 38.5 % (ref 38.5–50.0)
Hemoglobin: 13.1 g/dL — ABNORMAL LOW (ref 13.2–17.1)
Lymphs Abs: 3814 cells/uL (ref 850–3900)
MCH: 30.6 pg (ref 27.0–33.0)
MCHC: 34 g/dL (ref 32.0–36.0)
MCV: 90 fL (ref 80.0–100.0)
MPV: 11.1 fL (ref 7.5–12.5)
Monocytes Relative: 7.5 %
Neutro Abs: 3688 cells/uL (ref 1500–7800)
Neutrophils Relative %: 43.9 %
Platelets: 332 10*3/uL (ref 140–400)
RBC: 4.28 10*6/uL (ref 4.20–5.80)
RDW: 12.6 % (ref 11.0–15.0)
Total Lymphocyte: 45.4 %
WBC: 8.4 10*3/uL (ref 3.8–10.8)

## 2019-05-07 LAB — BASIC METABOLIC PANEL WITH GFR
BUN: 9 mg/dL (ref 7–25)
CO2: 28 mmol/L (ref 20–32)
Calcium: 9.5 mg/dL (ref 8.6–10.3)
Chloride: 97 mmol/L — ABNORMAL LOW (ref 98–110)
Creat: 1.03 mg/dL (ref 0.70–1.11)
GFR, Est African American: 77 mL/min/{1.73_m2} (ref >=60)
GFR, Est Non African American: 67 mL/min/{1.73_m2} (ref >=60)
Glucose, Bld: 285 mg/dL — ABNORMAL HIGH (ref 65–99)
Potassium: 4.2 mmol/L (ref 3.5–5.3)
Sodium: 136 mmol/L (ref 135–146)

## 2019-05-09 ENCOUNTER — Other Ambulatory Visit: Payer: Self-pay | Admitting: Family Medicine

## 2019-05-09 DIAGNOSIS — N179 Acute kidney failure, unspecified: Secondary | ICD-10-CM | POA: Diagnosis not present

## 2019-05-09 DIAGNOSIS — I82411 Acute embolism and thrombosis of right femoral vein: Secondary | ICD-10-CM | POA: Diagnosis not present

## 2019-05-09 DIAGNOSIS — J9621 Acute and chronic respiratory failure with hypoxia: Secondary | ICD-10-CM | POA: Diagnosis not present

## 2019-05-09 DIAGNOSIS — E1165 Type 2 diabetes mellitus with hyperglycemia: Secondary | ICD-10-CM | POA: Diagnosis not present

## 2019-05-09 DIAGNOSIS — I82812 Embolism and thrombosis of superficial veins of left lower extremities: Secondary | ICD-10-CM | POA: Diagnosis not present

## 2019-05-09 DIAGNOSIS — I129 Hypertensive chronic kidney disease with stage 1 through stage 4 chronic kidney disease, or unspecified chronic kidney disease: Secondary | ICD-10-CM | POA: Diagnosis not present

## 2019-05-09 DIAGNOSIS — I2699 Other pulmonary embolism without acute cor pulmonale: Secondary | ICD-10-CM | POA: Diagnosis not present

## 2019-05-09 DIAGNOSIS — R627 Adult failure to thrive: Secondary | ICD-10-CM | POA: Diagnosis not present

## 2019-05-09 DIAGNOSIS — F0150 Vascular dementia without behavioral disturbance: Secondary | ICD-10-CM | POA: Diagnosis not present

## 2019-05-09 MED ORDER — METFORMIN HCL 500 MG PO TABS: ORAL_TABLET | ORAL | 1 refills | Status: AC

## 2019-05-09 MED ORDER — PIOGLITAZONE HCL 30 MG PO TABS: 30.0000 mg | ORAL_TABLET | Freq: Every day | ORAL | 1 refills | Status: DC

## 2019-05-10 ENCOUNTER — Telehealth: Payer: Self-pay | Admitting: Nurse Practitioner

## 2019-05-10 NOTE — Telephone Encounter (Signed)
Called daughter Alfonzo Feller to schedule Palliative consult (at the request of Karlo Goeden - her mother)  After discussing Palliative services with daughter she has declined services at this time, stating that her Dad is doing much better and she doesn't feel that he needs Palliative at this time.  I told daughter that we would cancel the referral and that if she decided later on that Palliative is needed to contact Dr. Samella Parr office and he could send [place] another referral, she was in agreement with this.

## 2019-05-12 DIAGNOSIS — J9621 Acute and chronic respiratory failure with hypoxia: Secondary | ICD-10-CM | POA: Diagnosis not present

## 2019-05-12 DIAGNOSIS — R627 Adult failure to thrive: Secondary | ICD-10-CM | POA: Diagnosis not present

## 2019-05-12 DIAGNOSIS — F0150 Vascular dementia without behavioral disturbance: Secondary | ICD-10-CM | POA: Diagnosis not present

## 2019-05-12 DIAGNOSIS — I129 Hypertensive chronic kidney disease with stage 1 through stage 4 chronic kidney disease, or unspecified chronic kidney disease: Secondary | ICD-10-CM | POA: Diagnosis not present

## 2019-05-12 DIAGNOSIS — I2699 Other pulmonary embolism without acute cor pulmonale: Secondary | ICD-10-CM | POA: Diagnosis not present

## 2019-05-12 DIAGNOSIS — N179 Acute kidney failure, unspecified: Secondary | ICD-10-CM | POA: Diagnosis not present

## 2019-05-12 DIAGNOSIS — E1165 Type 2 diabetes mellitus with hyperglycemia: Secondary | ICD-10-CM | POA: Diagnosis not present

## 2019-05-12 DIAGNOSIS — I82411 Acute embolism and thrombosis of right femoral vein: Secondary | ICD-10-CM | POA: Diagnosis not present

## 2019-05-12 DIAGNOSIS — I82812 Embolism and thrombosis of superficial veins of left lower extremities: Secondary | ICD-10-CM | POA: Diagnosis not present

## 2019-05-16 DIAGNOSIS — I82812 Embolism and thrombosis of superficial veins of left lower extremities: Secondary | ICD-10-CM | POA: Diagnosis not present

## 2019-05-16 DIAGNOSIS — E1165 Type 2 diabetes mellitus with hyperglycemia: Secondary | ICD-10-CM | POA: Diagnosis not present

## 2019-05-16 DIAGNOSIS — I2699 Other pulmonary embolism without acute cor pulmonale: Secondary | ICD-10-CM | POA: Diagnosis not present

## 2019-05-16 DIAGNOSIS — R627 Adult failure to thrive: Secondary | ICD-10-CM | POA: Diagnosis not present

## 2019-05-16 DIAGNOSIS — N179 Acute kidney failure, unspecified: Secondary | ICD-10-CM | POA: Diagnosis not present

## 2019-05-16 DIAGNOSIS — I129 Hypertensive chronic kidney disease with stage 1 through stage 4 chronic kidney disease, or unspecified chronic kidney disease: Secondary | ICD-10-CM | POA: Diagnosis not present

## 2019-05-16 DIAGNOSIS — J9621 Acute and chronic respiratory failure with hypoxia: Secondary | ICD-10-CM | POA: Diagnosis not present

## 2019-05-16 DIAGNOSIS — I82411 Acute embolism and thrombosis of right femoral vein: Secondary | ICD-10-CM | POA: Diagnosis not present

## 2019-05-16 DIAGNOSIS — F0150 Vascular dementia without behavioral disturbance: Secondary | ICD-10-CM | POA: Diagnosis not present

## 2019-05-17 DIAGNOSIS — F0150 Vascular dementia without behavioral disturbance: Secondary | ICD-10-CM | POA: Diagnosis not present

## 2019-05-17 DIAGNOSIS — J9621 Acute and chronic respiratory failure with hypoxia: Secondary | ICD-10-CM | POA: Diagnosis not present

## 2019-05-17 DIAGNOSIS — I82812 Embolism and thrombosis of superficial veins of left lower extremities: Secondary | ICD-10-CM | POA: Diagnosis not present

## 2019-05-17 DIAGNOSIS — R627 Adult failure to thrive: Secondary | ICD-10-CM | POA: Diagnosis not present

## 2019-05-17 DIAGNOSIS — I82411 Acute embolism and thrombosis of right femoral vein: Secondary | ICD-10-CM | POA: Diagnosis not present

## 2019-05-17 DIAGNOSIS — I129 Hypertensive chronic kidney disease with stage 1 through stage 4 chronic kidney disease, or unspecified chronic kidney disease: Secondary | ICD-10-CM | POA: Diagnosis not present

## 2019-05-17 DIAGNOSIS — I2699 Other pulmonary embolism without acute cor pulmonale: Secondary | ICD-10-CM | POA: Diagnosis not present

## 2019-05-17 DIAGNOSIS — E1165 Type 2 diabetes mellitus with hyperglycemia: Secondary | ICD-10-CM | POA: Diagnosis not present

## 2019-05-17 DIAGNOSIS — N179 Acute kidney failure, unspecified: Secondary | ICD-10-CM | POA: Diagnosis not present

## 2019-05-19 DIAGNOSIS — J9621 Acute and chronic respiratory failure with hypoxia: Secondary | ICD-10-CM | POA: Diagnosis not present

## 2019-05-19 DIAGNOSIS — I129 Hypertensive chronic kidney disease with stage 1 through stage 4 chronic kidney disease, or unspecified chronic kidney disease: Secondary | ICD-10-CM | POA: Diagnosis not present

## 2019-05-19 DIAGNOSIS — F0150 Vascular dementia without behavioral disturbance: Secondary | ICD-10-CM | POA: Diagnosis not present

## 2019-05-19 DIAGNOSIS — N179 Acute kidney failure, unspecified: Secondary | ICD-10-CM | POA: Diagnosis not present

## 2019-05-19 DIAGNOSIS — I82411 Acute embolism and thrombosis of right femoral vein: Secondary | ICD-10-CM | POA: Diagnosis not present

## 2019-05-19 DIAGNOSIS — I2699 Other pulmonary embolism without acute cor pulmonale: Secondary | ICD-10-CM | POA: Diagnosis not present

## 2019-05-19 DIAGNOSIS — E1165 Type 2 diabetes mellitus with hyperglycemia: Secondary | ICD-10-CM | POA: Diagnosis not present

## 2019-05-19 DIAGNOSIS — R627 Adult failure to thrive: Secondary | ICD-10-CM | POA: Diagnosis not present

## 2019-05-19 DIAGNOSIS — I82812 Embolism and thrombosis of superficial veins of left lower extremities: Secondary | ICD-10-CM | POA: Diagnosis not present

## 2019-05-24 DIAGNOSIS — R627 Adult failure to thrive: Secondary | ICD-10-CM | POA: Diagnosis not present

## 2019-05-24 DIAGNOSIS — E1165 Type 2 diabetes mellitus with hyperglycemia: Secondary | ICD-10-CM | POA: Diagnosis not present

## 2019-05-24 DIAGNOSIS — J9621 Acute and chronic respiratory failure with hypoxia: Secondary | ICD-10-CM | POA: Diagnosis not present

## 2019-05-24 DIAGNOSIS — N179 Acute kidney failure, unspecified: Secondary | ICD-10-CM | POA: Diagnosis not present

## 2019-05-24 DIAGNOSIS — F0150 Vascular dementia without behavioral disturbance: Secondary | ICD-10-CM | POA: Diagnosis not present

## 2019-05-24 DIAGNOSIS — I2699 Other pulmonary embolism without acute cor pulmonale: Secondary | ICD-10-CM | POA: Diagnosis not present

## 2019-05-24 DIAGNOSIS — I82812 Embolism and thrombosis of superficial veins of left lower extremities: Secondary | ICD-10-CM | POA: Diagnosis not present

## 2019-05-24 DIAGNOSIS — I82411 Acute embolism and thrombosis of right femoral vein: Secondary | ICD-10-CM | POA: Diagnosis not present

## 2019-05-24 DIAGNOSIS — I129 Hypertensive chronic kidney disease with stage 1 through stage 4 chronic kidney disease, or unspecified chronic kidney disease: Secondary | ICD-10-CM | POA: Diagnosis not present

## 2019-05-30 DIAGNOSIS — J9621 Acute and chronic respiratory failure with hypoxia: Secondary | ICD-10-CM | POA: Diagnosis not present

## 2019-05-30 DIAGNOSIS — N402 Nodular prostate without lower urinary tract symptoms: Secondary | ICD-10-CM | POA: Diagnosis not present

## 2019-05-30 DIAGNOSIS — R972 Elevated prostate specific antigen [PSA]: Secondary | ICD-10-CM | POA: Diagnosis not present

## 2019-05-31 ENCOUNTER — Other Ambulatory Visit: Payer: Self-pay | Admitting: Family Medicine

## 2019-05-31 MED ORDER — APIXABAN 5 MG PO TABS: 5.0000 mg | ORAL_TABLET | Freq: Two times a day (BID) | ORAL | 5 refills | Status: DC

## 2019-06-03 ENCOUNTER — Other Ambulatory Visit: Payer: Self-pay | Admitting: Family Medicine

## 2019-06-03 DIAGNOSIS — F0150 Vascular dementia without behavioral disturbance: Secondary | ICD-10-CM | POA: Diagnosis not present

## 2019-06-03 DIAGNOSIS — E1165 Type 2 diabetes mellitus with hyperglycemia: Secondary | ICD-10-CM | POA: Diagnosis not present

## 2019-06-03 DIAGNOSIS — R627 Adult failure to thrive: Secondary | ICD-10-CM | POA: Diagnosis not present

## 2019-06-03 DIAGNOSIS — I2699 Other pulmonary embolism without acute cor pulmonale: Secondary | ICD-10-CM | POA: Diagnosis not present

## 2019-06-03 DIAGNOSIS — I82411 Acute embolism and thrombosis of right femoral vein: Secondary | ICD-10-CM | POA: Diagnosis not present

## 2019-06-03 DIAGNOSIS — I82812 Embolism and thrombosis of superficial veins of left lower extremities: Secondary | ICD-10-CM | POA: Diagnosis not present

## 2019-06-03 DIAGNOSIS — J9621 Acute and chronic respiratory failure with hypoxia: Secondary | ICD-10-CM | POA: Diagnosis not present

## 2019-06-03 DIAGNOSIS — I129 Hypertensive chronic kidney disease with stage 1 through stage 4 chronic kidney disease, or unspecified chronic kidney disease: Secondary | ICD-10-CM | POA: Diagnosis not present

## 2019-06-03 DIAGNOSIS — N179 Acute kidney failure, unspecified: Secondary | ICD-10-CM | POA: Diagnosis not present

## 2019-06-15 ENCOUNTER — Other Ambulatory Visit: Payer: Self-pay | Admitting: Family Medicine

## 2019-06-15 DIAGNOSIS — E1165 Type 2 diabetes mellitus with hyperglycemia: Secondary | ICD-10-CM

## 2019-06-15 DIAGNOSIS — IMO0002 Reserved for concepts with insufficient information to code with codable children: Secondary | ICD-10-CM

## 2019-06-16 DIAGNOSIS — F0150 Vascular dementia without behavioral disturbance: Secondary | ICD-10-CM | POA: Diagnosis not present

## 2019-06-16 DIAGNOSIS — I82812 Embolism and thrombosis of superficial veins of left lower extremities: Secondary | ICD-10-CM | POA: Diagnosis not present

## 2019-06-16 DIAGNOSIS — I2699 Other pulmonary embolism without acute cor pulmonale: Secondary | ICD-10-CM | POA: Diagnosis not present

## 2019-06-16 DIAGNOSIS — E1165 Type 2 diabetes mellitus with hyperglycemia: Secondary | ICD-10-CM | POA: Diagnosis not present

## 2019-06-16 DIAGNOSIS — R627 Adult failure to thrive: Secondary | ICD-10-CM | POA: Diagnosis not present

## 2019-06-16 DIAGNOSIS — N179 Acute kidney failure, unspecified: Secondary | ICD-10-CM | POA: Diagnosis not present

## 2019-06-16 DIAGNOSIS — I82411 Acute embolism and thrombosis of right femoral vein: Secondary | ICD-10-CM | POA: Diagnosis not present

## 2019-06-16 DIAGNOSIS — J9621 Acute and chronic respiratory failure with hypoxia: Secondary | ICD-10-CM | POA: Diagnosis not present

## 2019-06-16 DIAGNOSIS — I129 Hypertensive chronic kidney disease with stage 1 through stage 4 chronic kidney disease, or unspecified chronic kidney disease: Secondary | ICD-10-CM | POA: Diagnosis not present

## 2019-06-24 DIAGNOSIS — Z86711 Personal history of pulmonary embolism: Secondary | ICD-10-CM | POA: Diagnosis not present

## 2019-06-24 DIAGNOSIS — M545 Low back pain: Secondary | ICD-10-CM | POA: Diagnosis not present

## 2019-06-24 DIAGNOSIS — E119 Type 2 diabetes mellitus without complications: Secondary | ICD-10-CM | POA: Diagnosis not present

## 2019-06-24 DIAGNOSIS — G8929 Other chronic pain: Secondary | ICD-10-CM | POA: Diagnosis not present

## 2019-06-27 DIAGNOSIS — J9621 Acute and chronic respiratory failure with hypoxia: Secondary | ICD-10-CM | POA: Diagnosis not present

## 2019-06-27 DIAGNOSIS — I82812 Embolism and thrombosis of superficial veins of left lower extremities: Secondary | ICD-10-CM | POA: Diagnosis not present

## 2019-06-27 DIAGNOSIS — I82411 Acute embolism and thrombosis of right femoral vein: Secondary | ICD-10-CM | POA: Diagnosis not present

## 2019-06-27 DIAGNOSIS — R627 Adult failure to thrive: Secondary | ICD-10-CM | POA: Diagnosis not present

## 2019-06-27 DIAGNOSIS — E1165 Type 2 diabetes mellitus with hyperglycemia: Secondary | ICD-10-CM | POA: Diagnosis not present

## 2019-06-27 DIAGNOSIS — N179 Acute kidney failure, unspecified: Secondary | ICD-10-CM | POA: Diagnosis not present

## 2019-06-27 DIAGNOSIS — F0150 Vascular dementia without behavioral disturbance: Secondary | ICD-10-CM | POA: Diagnosis not present

## 2019-06-27 DIAGNOSIS — I2699 Other pulmonary embolism without acute cor pulmonale: Secondary | ICD-10-CM | POA: Diagnosis not present

## 2019-06-27 DIAGNOSIS — I129 Hypertensive chronic kidney disease with stage 1 through stage 4 chronic kidney disease, or unspecified chronic kidney disease: Secondary | ICD-10-CM | POA: Diagnosis not present

## 2019-06-30 DIAGNOSIS — J9621 Acute and chronic respiratory failure with hypoxia: Secondary | ICD-10-CM | POA: Diagnosis not present

## 2019-07-06 ENCOUNTER — Other Ambulatory Visit: Payer: Self-pay

## 2019-07-07 ENCOUNTER — Encounter: Payer: Self-pay | Admitting: Family Medicine

## 2019-07-07 ENCOUNTER — Ambulatory Visit (INDEPENDENT_AMBULATORY_CARE_PROVIDER_SITE_OTHER): Payer: Medicare HMO | Admitting: Family Medicine

## 2019-07-07 VITALS — BP 110/68 | HR 82 | Temp 97.8°F | Resp 18 | Ht 70.5 in | Wt 168.0 lb

## 2019-07-07 DIAGNOSIS — Z23 Encounter for immunization: Secondary | ICD-10-CM

## 2019-07-07 DIAGNOSIS — E118 Type 2 diabetes mellitus with unspecified complications: Secondary | ICD-10-CM

## 2019-07-07 DIAGNOSIS — R972 Elevated prostate specific antigen [PSA]: Secondary | ICD-10-CM

## 2019-07-07 DIAGNOSIS — I2699 Other pulmonary embolism without acute cor pulmonale: Secondary | ICD-10-CM

## 2019-07-07 NOTE — Addendum Note (Signed)
Addended by: Shary Decamp B on: 07/07/2019 11:52 AM   Modules accepted: Orders

## 2019-07-07 NOTE — Progress Notes (Signed)
Subjective:    Patient ID: Ryan Gilmore, male    DOB: [birth year], 85 y.o.   MRN: [id number]  HPI  Please see my previous office visit.  The patient was referred to the emergency room due to hypoxia and respiratory distress.  Initially I felt that the patient may have pneumonia however evaluation in the emergency room revealed pulmonary embolism as a source of his respiratory distress.  I have copied relevant portions of the discharge summary below for my reference: Admit date: 04/26/2019 Discharge date: 04/29/2019  Time spent: 86mns, more than 50% time spent on coordination of care.  Recommendations for Outpatient Follow-up:  1. F/u with PCP within a week  for hospital discharge follow up, repeat cbc/bmp at follow up 2. Home health, home 02 3. Case manager notified to arrange Community palliative care  Discharge Diagnoses:      Active Hospital Problems   Diagnosis Date Noted   Palliative care by specialist    DNR (do not resuscitate) discussion    Acute hypoxemic respiratory failure (HCC)    Acute on chronic respiratory failure with hypoxia ([facility]    Vascular dementia without behavioral disturbance (HCC)    FTT (failure to thrive) in adult    Acute pulmonary embolism without acute cor pulmonale ([facility] 04/26/2019   Essential hypertension 04/26/2019   AKI (acute kidney injury) ([facility] 04/26/2019   Diabetes mellitus type 2, noninsulin dependent ([facility] 06/28/2013    Resolved Hospital Problems  No resolved problems to display.    Discharge Condition: stable  Diet recommendation: heart healthy/carb modified       Filed Weights   04/26/19 1650 04/28/19 0503 04/29/19 0601  Weight: 74.5 kg 72.8 kg 75.9 kg    History of present illness: (per admitting MD Dr PDoristine Bosworth PPZW:CHENIDP WCammie [name] MD Patient coming from:Home/PCP office  I have personally briefly reviewed patient's old medical [place] Chief Complaint:No complaint from  patient/hypoxia at PCP office  HOEU:MPNTIW Ryan Gilmore a 85 y.o.malewith medical history significant ofhypertension, hyperlipidemia, dementia and type 2 diabetes mellitus was sent to the emergency department from his PCPs office for further evaluation of hypoxia. Reportedly, patient went to see his PCP along with his wife for routine checkup however he was found to be hypoxic with saturation about 80% on room air so he was sent to the emergency department. Patient has severe dementia to the point that he does not know why he is in the hospital. When I talked to him, he did not even recall that he saw his PCP today. I called patient's wife on the phone and she verified that patient has not been having any complaints such as chest pain, shortness of breath, fever, chills, sweating, nausea, vomiting, any problem with urination or with bowel movement.   ED Course:Upon arrival to the emergency department, patient was slightly tachypneic and tachycardic however blood pressure was within normal limits. He was requiring 3 L of oxygen to keep his oxygen saturation more than 90%. Initial work-up was directed toward possible COVID-19 infection. Inflammatory markers were obtained and half of them are elevated. Subsequently CT angiogram of the chest was done and he was diagnosed with heavy load of bilateral PE with right heart strain. He was started on heparin and hospital service was consulted to admit the patient for further management.   Hospital Course:  Active Problems:   Diabetes mellitus type 2, noninsulin dependent ([facility]   Acute pulmonary embolism without acute cor pulmonale (HCC)  Essential hypertension   AKI (acute kidney injury) ([place])   Acute on chronic respiratory failure with hypoxia (HCC)   Vascular dementia without behavioral disturbance (HCC)   FTT (failure to thrive) in adult   Palliative care by specialist   DNR (do not resuscitate) discussion   Acute hypoxemic respiratory  failure ([place])   Acute bilateral DVT/Acute bilateral PE/ right heart strain /acute hypoxic respiratory failure -per daughter patient has been very sedentary recently -echo lvef wnl,There is right ventricular volume and pressure overload. Right ventricular systolic pressure is severely elevated with an estimated pressure of 74.2 mmHg -critical care input appreciated, no plan for thrombolysis --he received heparin dripx48hrs, then transition to Eliquis, case manager notified for eliquis meds assistance  -need home 02  AKI on CKD II, improved  Cr 1.25 on presentation Appear dehydrated, he received hydration, Cr improved , today is0.96  Hypomagnesemia:replaced, improved   HTN: Home meds lisinopril held for now due to acute PE and AKI Resumed at discharge  noninsulin dependent dm2, with hyperglycemia a1c in 02/2019 was 8 Am blood glucose is 246 On ssi in the hospital Home meds metformin/glipizide/actos held in the hospital, resumed at discharge continue statin   Elevated PSA Case discussed with urology on call Dr Ryan Gilmore per daughter's request. Per Ryan Gilmore, patient needs to reschedule urology appoint till 3-4 weeks out from acute PE  FTT/ progressive dementia/significant weight loss -per daughter patient has been very sedentary, mostly in bed or in chair, most of the days -PT evalrecommend home health -Palliative care consulted for goals of care, recommend continue goals of care discussion and follow up with community palliative care services  Code Status:full  Family Communication:patient in room, daughter over the phone  Disposition Plan: home with home o2 and home health Pt/RN/Aids/socail worker   05/06/19 Patient is here today with his wife and his daughter.  Although very pleasant, the patient is not able to contribute to the history.  He denies any complaints today.  He denies any shortness of breath.  He is not wearing his oxygen.  However he  is 95% on room air.  His wife who has mild dementia forgot his oxygen at home.  She had also forgotten today's appointment until reminded by her daughter.  I am increasingly concerned about his wife's capacity to care for him and herself although at the present time I feel that they are safe to be at home together.  His daughter has taken over managing his medication.  He has been using glipizide, metformin, and Actos.  However his fasting blood sugar is between 270 and 300 and his 2-hour postprandial sugar is in the high 200s to 300 range since home from the hospital.  Wife is not checking his blood sugar because she is forgetting to.  He cannot tolerate Trulicity due to nausea.  He is a very poor insulin candidate due to his dementia and his wife's dementia however he needs assistance in managing his blood sugar.  They have been taking Eliquis and have transitioned to 5 mg twice a day.  They are able to afford this medication.  Wife denies any bleeding or bruising.  Patient denies any leg pain or dyspnea on exertion or pleurisy.  At that time, my plan was: 1. Pulmonary embolism and infarction (HCC) Continue Eliquis 5 mg twice daily for at least 6 months.  I believe his pulmonary embolism is likely due to a DVT caused by his sedentary lifestyle however given his elevated PSA I am  also concerned about an underlying malignancy causing a hypercoagulable state.  Therefore we may need to continue this medication indefinitely if the malignancy cannot be definitively treated - CBC with Differential/Platelet - BASIC METABOLIC PANEL WITH GFR  2. Hypoxia Patient is 95% on room air at rest however I suspect hypoxia with exercise.  Therefore I recommended oxygen whenever he is ambulating and I will reassess the patient's oxygen saturation in 1 month 3. Elevated PSA I suspect underlying prostate cancer.  Patient has an appointment to see urology in August.  I will await urology's input.  However I would recommend that  we continue his anticoagulant for at least 3 months prior to stopping it for any planned biopsy given the right ventricular strain that was seen on echocardiogram and the bilateral low burden seen on CT.  Ideally I would like to continue anticoagulation for at least 6 months.  4. Uncontrolled type 2 diabetes mellitus with complication, without long-term current use of insulin (HCC) Due to the patient's dementia and failure to thrive and fluctuating appetite and his wife's dementia is going to be difficult to control his blood sugars.  Therefore if I can get his blood sugars under 200 I will be extremely happy with this.  I believe the safest option is to add Januvia 100 mg daily to what he is taking and then see how his fasting blood sugars are performing in 1 month.  07/07/19 Patient met with urology August 24.  At that time they discussed his options and have recommended repeating a PSA in 3 months.  This would complete 3 months of therapy for pulmonary embolism and he could then discontinue his Eliquis and have a prostate biopsy which was the recommendation of urology. Patient looks like a new man today.  When I last saw him, he was frail, feeble, disheveled in appearance, tachycardic and short of breath.  Today he looks much stronger.  He is gaining weight.  His color looks better.  He is breathing without any difficulty.  Pulse oximetry on room air is 98%.  There is no tachycardia on his physical exam.  He is much more alert and appears stronger.  I am impressed at how he is recovered in the last 2 months.  He denies any bleeding or bruising on the Eliquis.  We took this opportunity today to discuss treatment for his pulmonary embolism.  Recommendations would be to complete 6 months of anticoagulation however he has a plan to see the urologist in November to repeat the PSA.  I suspect that if the PSA is rising they would recommend a prostate biopsy.  I am fine with the patient discontinuing  anticoagulation after 3 months so that he could have a prostate biopsy if necessary.  We discussed this today with his wife and his daughter.  I would recommend discontinuing the Eliquis 48 hours prior to the biopsy and then resuming afterward to complete 6 months of therapy.  He is due today for his flu shot.  His wife is been checking his blood sugars and states that she has not seen a blood sugar over 200 which I believe is acceptable for this individual given his situation. Past Medical History:  Diagnosis Date   Bleeding duodenal ulcer    ?  1980s   Dementia ([place])    Diabetes mellitus without complication ([place])    Hyperlipidemia    Past Surgical History:  Procedure Laterality Date   APPENDECTOMY     Current Outpatient Medications  on File Prior to Visit  Medication Sig Dispense Refill   apixaban (ELIQUIS) 5 MG TABS tablet Take 1 tablet (5 mg total) by mouth 2 (two) times daily. 60 tablet 5   Blood Glucose Monitoring Suppl (BLOOD GLUCOSE SYSTEM PAK) KIT Please dispense based on patient and insurance preference. Use as directed to monitor FSBS 2x. Dx: E11.E11.65. 1 kit 1   donepezil (ARICEPT) 5 MG tablet TAKE 1 TABLET BY MOUTH AT BEDTIME (Patient taking differently: Take 5 mg by mouth at bedtime. ) 90 tablet 3   glipiZIDE (GLUCOTROL XL) 10 MG 24 hr tablet Take 1 tablet (10 mg total) by mouth daily with breakfast. 90 tablet 1   Glucose Blood (BLOOD GLUCOSE TEST STRIPS) STRP Please dispense one touch verio flex lancets, and testing strips. Use as directed to monitor FSBS 2x. Dx: E11.E11.65. 100 strip 11   Lancets MISC Please dispense based on patient and insurance preference. Use as directed to monitor FSBS 2x. Dx: E11.E11.65. 100 each 3   linaclotide (LINZESS) 290 MCG CAPS capsule Take 290 mcg by mouth daily before breakfast.     lisinopril (ZESTRIL) 10 MG tablet Take 1 tablet by mouth once daily 90 tablet 3   metFORMIN (GLUCOPHAGE) 500 MG tablet TAKE 2 TABLETS BY MOUTH TWICE  DAILY WITH A MEAL 360 tablet 1   pioglitazone (ACTOS) 30 MG tablet Take 1 tablet (30 mg total) by mouth daily. 90 tablet 1   pravastatin (PRAVACHOL) 40 MG tablet Take 1 tablet by mouth once daily 90 tablet 3   sitaGLIPtin (JANUVIA) 100 MG tablet Take 1 tablet (100 mg total) by mouth daily. 90 tablet 3   No current facility-administered medications on file prior to visit.    No Known Allergies Social History   Socioeconomic History   Marital status: Married    Spouse name: Not on file   Number of children: Not on file   Years of education: Not on file   Highest education level: Not on file  Occupational History   Not on file  Social Needs   Financial resource strain: Not on file   Food insecurity    Worry: Not on file    Inability: Not on file   Transportation needs    Medical: Not on file    Non-medical: Not on file  Tobacco Use   Smoking status: Never Smoker   Smokeless tobacco: Current User    Types: Chew  Substance and Sexual Activity   Alcohol use: Not Currently   Drug use: Never   Sexual activity: Not on file  Lifestyle   Physical activity    Days per week: Not on file    Minutes per session: Not on file   Stress: Not on file  Relationships   Social connections    Talks on phone: Not on file    Gets together: Not on file    Attends religious service: Not on file    Active member of club or organization: Not on file    Attends meetings of clubs or organizations: Not on file    Relationship status: Not on file   Intimate partner violence    Fear of current or ex partner: Not on file    Emotionally abused: Not on file    Physically abused: Not on file    Forced sexual activity: Not on file  Other Topics Concern   Not on file  Social History Narrative   Not on file     Review of  Systems  All other systems reviewed and are negative.      Objective:   Physical Exam Constitutional:      General: He is not in acute distress.     Appearance: Normal appearance. He is not ill-appearing, toxic-appearing or diaphoretic.  Cardiovascular:     Rate and Rhythm: Normal rate and regular rhythm.     Heart sounds: Normal heart sounds. No murmur. No friction rub. No gallop.   Pulmonary:     Effort: Pulmonary effort is normal. No respiratory distress.     Breath sounds: Normal breath sounds. No stridor. No wheezing or rhonchi.  Musculoskeletal:     Right lower leg: No edema.     Left lower leg: No edema.  Neurological:     Mental Status: He is alert.           Assessment & Plan:   Controlled type 2 diabetes mellitus with complication, without long-term current use of insulin (HCC) - Plan: Hemoglobin A1c, CBC with Differential/Platelet, COMPLETE METABOLIC PANEL WITH GFR  Pulmonary embolism and infarction (HCC)  Elevated PSA  Overall patient looks much better.  I believe he can discontinue oxygen.  I would recommend repeating his PSA in November as planned by his urologist and if his urologist recommends a biopsy at that time I would discontinue Eliquis 48 hours prior to the biopsy and then resume afterwards to complete 6 months of therapy.  His reported blood sugars sound well controlled.  My goal for this patient is to keep his blood sugars between 102 100.  He is certainly in that range.  Today on his exam his oxygen is 98% on room air.  Therefore I believe he can discontinue oxygen.  There is no evidence of cardiac strain on his exam.  There is no tachycardia.  He is gaining weight.  Overall the patient looks much better and is more alert.  I congratulated his family on their excellent care for him.

## 2019-07-08 ENCOUNTER — Other Ambulatory Visit: Payer: Self-pay | Admitting: Family Medicine

## 2019-07-08 LAB — COMPLETE METABOLIC PANEL WITH GFR
AG Ratio: 1.9 (calc) (ref 1.0–2.5)
ALT: 9 U/L (ref 9–46)
AST: 10 U/L (ref 10–35)
Albumin: 4.3 g/dL (ref 3.6–5.1)
Alkaline phosphatase (APISO): 50 U/L (ref 35–144)
BUN/Creatinine Ratio: 11 (calc) (ref 6–22)
BUN: 13 mg/dL (ref 7–25)
CO2: 27 mmol/L (ref 20–32)
Calcium: 9.7 mg/dL (ref 8.6–10.3)
Chloride: 97 mmol/L — ABNORMAL LOW (ref 98–110)
Creat: 1.17 mg/dL — ABNORMAL HIGH (ref 0.70–1.11)
GFR, Est African American: 66 mL/min/{1.73_m2} (ref >=60)
GFR, Est Non African American: 57 mL/min/{1.73_m2} — ABNORMAL LOW (ref >=60)
Globulin: 2.3 g/dL (calc) (ref 1.9–3.7)
Glucose, Bld: 443 mg/dL — ABNORMAL HIGH (ref 65–99)
Potassium: 4.8 mmol/L (ref 3.5–5.3)
Sodium: 135 mmol/L (ref 135–146)
Total Bilirubin: 0.5 mg/dL (ref 0.2–1.2)
Total Protein: 6.6 g/dL (ref 6.1–8.1)

## 2019-07-08 LAB — CBC WITH DIFFERENTIAL/PLATELET
Absolute Monocytes: 515 cells/uL (ref 200–950)
Basophils Absolute: 69 cells/uL (ref 0–200)
Basophils Relative: 0.7 %
Eosinophils Absolute: 455 cells/uL (ref 15–500)
Eosinophils Relative: 4.6 %
HCT: 44.4 % (ref 38.5–50.0)
Hemoglobin: 14.4 g/dL (ref 13.2–17.1)
Lymphs Abs: 4445 cells/uL — ABNORMAL HIGH (ref 850–3900)
MCH: 29.6 pg (ref 27.0–33.0)
MCHC: 32.4 g/dL (ref 32.0–36.0)
MCV: 91.4 fL (ref 80.0–100.0)
MPV: 11.8 fL (ref 7.5–12.5)
Monocytes Relative: 5.2 %
Neutro Abs: 4415 cells/uL (ref 1500–7800)
Neutrophils Relative %: 44.6 %
Platelets: 217 10*3/uL (ref 140–400)
RBC: 4.86 10*6/uL (ref 4.20–5.80)
RDW: 13 % (ref 11.0–15.0)
Total Lymphocyte: 44.9 %
WBC: 9.9 10*3/uL (ref 3.8–10.8)

## 2019-07-08 LAB — HEMOGLOBIN A1C
Hgb A1c MFr Bld: 9.7 % of total Hgb — ABNORMAL HIGH (ref <5.7)
Mean Plasma Glucose: 232 (calc)
eAG (mmol/L): 12.8 (calc)

## 2019-07-08 MED ORDER — LANTUS SOLOSTAR 100 UNIT/ML ~~LOC~~ SOPN: 10.0000 [IU] | PEN_INJECTOR | Freq: Every day | SUBCUTANEOUS | 2 refills | Status: DC

## 2019-07-08 MED ORDER — "PEN NEEDLES 3/16"" 31G X 5 MM MISC": 2 refills | Status: DC

## 2019-07-13 ENCOUNTER — Telehealth: Payer: Self-pay | Admitting: Family Medicine

## 2019-07-13 NOTE — Telephone Encounter (Signed)
Cam called, pt's daughter, and states that she received my message about the insulin however he has not been taking any of his diabetic medications and would like to try taking them again and see how his labs are in 3 months before starting insulin.   Dr. Dennard Schaumann made aware and recommends he restart the Actos and Januvia but not the Glipizide at this point and hopefull that will bring his BS down to a manageable level.   Tried to call cam - no answer and vm full.   Cam returned call and made aware of recommendations.

## 2019-08-22 DIAGNOSIS — R972 Elevated prostate specific antigen [PSA]: Secondary | ICD-10-CM | POA: Diagnosis not present

## 2019-08-29 DIAGNOSIS — R972 Elevated prostate specific antigen [PSA]: Secondary | ICD-10-CM | POA: Diagnosis not present

## 2019-08-29 DIAGNOSIS — N402 Nodular prostate without lower urinary tract symptoms: Secondary | ICD-10-CM | POA: Diagnosis not present

## 2019-10-27 ENCOUNTER — Ambulatory Visit: Payer: Medicare HMO | Attending: Internal Medicine

## 2019-10-27 DIAGNOSIS — Z23 Encounter for immunization: Secondary | ICD-10-CM | POA: Insufficient documentation

## 2019-10-27 NOTE — Progress Notes (Signed)
   Covid-19 Vaccination Clinic  Name:  ESTEL LINDSKOG    MRN: [id number] DOB: 22-Apr-1933  10/27/2019  Mr. Ketner was observed post Covid-19 immunization for 15 minutes without incidence. He was provided with Vaccine Information Sheet and instruction to access the V-Safe system.   Mr. Canchola was instructed to call 911 with any severe reactions post vaccine: Marland Kitchen Difficulty breathing  . Swelling of your face and throat  . A fast heartbeat  . A bad rash all over your body  . Dizziness and weakness    Immunizations Administered    Name Date Dose VIS Date Route   Pfizer COVID-19 Vaccine 10/27/2019  4:22 PM 0.3 mL 09/16/2019 Intramuscular   Manufacturer: [employer]   Lot: [id number]   [place]: [id number]

## 2019-11-04 ENCOUNTER — Ambulatory Visit (INDEPENDENT_AMBULATORY_CARE_PROVIDER_SITE_OTHER): Payer: Medicare HMO | Admitting: Family Medicine

## 2019-11-04 ENCOUNTER — Other Ambulatory Visit: Payer: Self-pay

## 2019-11-04 VITALS — BP 120/68 | HR 78 | Temp 97.3°F | Resp 16 | Ht 70.5 in | Wt 172.0 lb

## 2019-11-04 DIAGNOSIS — E119 Type 2 diabetes mellitus without complications: Secondary | ICD-10-CM

## 2019-11-04 DIAGNOSIS — F0150 Vascular dementia without behavioral disturbance: Secondary | ICD-10-CM

## 2019-11-04 DIAGNOSIS — E538 Deficiency of other specified B group vitamins: Secondary | ICD-10-CM

## 2019-11-04 LAB — GLUCOSE 16585: Glucose: 299 mg/dL — ABNORMAL HIGH (ref 65–99)

## 2019-11-04 MED ORDER — CYANOCOBALAMIN 1000 MCG/ML IJ SOLN
1000.0000 ug | Freq: Once | INTRAMUSCULAR | Status: AC
  Administered 2019-11-04: 1000 ug via INTRAMUSCULAR

## 2019-11-04 NOTE — Progress Notes (Signed)
Subjective:    Patient ID: Ryan Gilmore, male    DOB: 08/14/1933, 86 y.o.   MRN: [id number]  HPI   Patient is a very kind 86 year old African-American gentleman who is here today with his wife and his daughter to discuss his diabetes.  Unfortunately he has moderately advanced dementia and therefore his wife is his primary caregiver.  Unfortunately she also has dementia and frequently gets confused.  Since they live independently, I have been hesitant to start insulin due to the inherent risk of taking insulin in an individual who is unable to administer their own medication and their caregiver also has memory issues.  Furthermore they do not check their blood sugars at all.  All of this is caused me to be very conservative and how I have managed his diabetes.  As I discussed with his daughter, if we can keep his A1c under 8.5 at his age I would be very happy with this.  However despite being on all the medications on his medication list, he recently went to the [place] and his hemoglobin A1c was 12.6.  I asked his wife on 4 separate occasions politely and she is adamant that she is giving him all the medications listed.  This would imply that his pancreas is failed and he is now insulin-dependent.  Honestly, I am concerned that he may not be taking his medication correctly however I have to believe what is being told to me.  His B12 level was also low at barely over 100.  He is here today to discuss treatment options. Past Medical History:  Diagnosis Date  . Bleeding duodenal ulcer    ?  1980s  . Dementia ([place])   . Diabetes mellitus without complication ([place])   . Hyperlipidemia    Past Surgical History:  Procedure Laterality Date  . APPENDECTOMY     Current Outpatient Medications on File Prior to Visit  Medication Sig Dispense Refill  . apixaban (ELIQUIS) 5 MG TABS tablet Take 1 tablet (5 mg total) by mouth 2 (two) times daily. 60 tablet 5  . Blood Glucose Monitoring Suppl (BLOOD GLUCOSE SYSTEM  PAK) KIT Please dispense based on patient and insurance preference. Use as directed to monitor FSBS 2x. Dx: E11.E11.65. 1 kit 1  . donepezil (ARICEPT) 5 MG tablet TAKE 1 TABLET BY MOUTH AT BEDTIME (Patient taking differently: Take 5 mg by mouth at bedtime. ) 90 tablet 3  . glipiZIDE (GLUCOTROL XL) 10 MG 24 hr tablet Take 1 tablet (10 mg total) by mouth daily with breakfast. 90 tablet 1  . Glucose Blood (BLOOD GLUCOSE TEST STRIPS) STRP Please dispense one touch verio flex lancets, and testing strips. Use as directed to monitor FSBS 2x. Dx: S01.U93.23. 100 strip 11  . Lancets MISC Please dispense based on patient and insurance preference. Use as directed to monitor FSBS 2x. Dx: E11.E11.65. 100 each 3  . lisinopril (ZESTRIL) 10 MG tablet Take 1 tablet by mouth once daily 90 tablet 3  . metFORMIN (GLUCOPHAGE) 500 MG tablet TAKE 2 TABLETS BY MOUTH TWICE DAILY WITH A MEAL 360 tablet 1  . pioglitazone (ACTOS) 30 MG tablet Take 1 tablet (30 mg total) by mouth daily. 90 tablet 1  . pravastatin (PRAVACHOL) 40 MG tablet Take 1 tablet by mouth once daily 90 tablet 3  . sitaGLIPtin (JANUVIA) 100 MG tablet Take 1 tablet (100 mg total) by mouth daily. 90 tablet 3  . linaclotide (LINZESS) 290 MCG CAPS capsule Take 290 mcg by  mouth daily before breakfast.     No current facility-administered medications on file prior to visit.   No Known Allergies Social History   Socioeconomic History  . Marital status: Married    Spouse name: Not on file  . Number of children: Not on file  . Years of education: Not on file  . Highest education level: Not on file  Occupational History  . Not on file  Tobacco Use  . Smoking status: Never Smoker  . Smokeless tobacco: Current User    Types: Chew  Substance and Sexual Activity  . Alcohol use: Not Currently  . Drug use: Never  . Sexual activity: Not on file  Other Topics Concern  . Not on file  Social History Narrative  . Not on file   Social Determinants of Health     Financial Resource Strain:   . Difficulty of Paying Living Expenses: Not on file  Food Insecurity:   . Worried About Charity fundraiser in the Last Year: Not on file  . Ran Out of Food in the Last Year: Not on file  Transportation Needs:   . Lack of Transportation (Medical): Not on file  . Lack of Transportation (Non-Medical): Not on file  Physical Activity:   . Days of Exercise per Week: Not on file  . Minutes of Exercise per Session: Not on file  Stress:   . Feeling of Stress : Not on file  Social Connections:   . Frequency of Communication with Friends and Family: Not on file  . Frequency of Social Gatherings with Friends and Family: Not on file  . Attends Religious Services: Not on file  . Active Member of Clubs or Organizations: Not on file  . Attends Archivist Meetings: Not on file  . Marital Status: Not on file  Intimate Partner Violence:   . Fear of Current or Ex-Partner: Not on file  . Emotionally Abused: Not on file  . Physically Abused: Not on file  . Sexually Abused: Not on file     Review of Systems  All other systems reviewed and are negative.      Objective:   Physical Exam Constitutional:      General: He is not in acute distress.    Appearance: Normal appearance. He is not ill-appearing, toxic-appearing or diaphoretic.  Cardiovascular:     Rate and Rhythm: Normal rate and regular rhythm.     Heart sounds: Normal heart sounds. No murmur. No friction rub. No gallop.   Pulmonary:     Effort: Pulmonary effort is normal. No respiratory distress.     Breath sounds: Normal breath sounds. No stridor. No wheezing or rhonchi.  Musculoskeletal:     Right lower leg: No edema.     Left lower leg: No edema.  Neurological:     Mental Status: He is alert.           Assessment & Plan:   Diabetes mellitus type 2, noninsulin dependent ([place]) - Plan: GLUCOSE 33545  B12 deficiency - Plan: cyanocobalamin ((VITAMIN B-12)) injection 1,000  mcg  Vascular dementia without behavioral disturbance ([place])  At this point, I can see no other options besides insulin to have any reasonable chance of getting his blood sugars under control.  Obviously this is risky given his current social situation.  Therefore I would recommend going very conservatively and starting low at Lantus 7 units a day.  I will ask his daughter to administer the shot.  My nurse  gave the patient his first shot today and taught the patient's daughter how to administer the shot.  They will check his blood sugar every day and report this to me in 1 week.  At that time I would make a gross adjustment in his insulin based on the reported sugars.  I will go very slowly until we get his fasting blood sugars below 200.  Once we get them below 200 I would leave his insulin at that dose to allow buffer for safety.  I would then start to discontinue some of his oral medications starting with the glipizide as this is likely worthless at this point.  Start the patient on vitamin B12 1000 mcg IM weekly for 4 weeks and then monthly thereafter indefinitely.  Recheck via telephone in 1 week

## 2019-11-11 ENCOUNTER — Other Ambulatory Visit: Payer: Self-pay

## 2019-11-11 ENCOUNTER — Ambulatory Visit (INDEPENDENT_AMBULATORY_CARE_PROVIDER_SITE_OTHER): Payer: Medicare HMO | Admitting: Family Medicine

## 2019-11-11 DIAGNOSIS — E538 Deficiency of other specified B group vitamins: Secondary | ICD-10-CM | POA: Diagnosis not present

## 2019-11-11 MED ORDER — CYANOCOBALAMIN 1000 MCG/ML IJ SOLN
1000.0000 ug | Freq: Once | INTRAMUSCULAR | Status: AC
  Administered 2019-11-11: 1000 ug via INTRAMUSCULAR

## 2019-11-17 ENCOUNTER — Ambulatory Visit: Payer: Medicare HMO | Attending: Internal Medicine

## 2019-11-17 DIAGNOSIS — Z23 Encounter for immunization: Secondary | ICD-10-CM | POA: Insufficient documentation

## 2019-11-17 NOTE — Progress Notes (Signed)
   Covid-19 Vaccination Clinic  Name:  Ryan Gilmore    MRN: [id number] DOB: [birth year]  11/17/2019  [name] was observed post Covid-19 immunization for 15 minutes without incidence. He was provided with Vaccine Information Sheet and instruction to access the V-Safe system.   [name] was instructed to call 911 with any severe reactions post vaccine: Marland Kitchen Difficulty breathing  . Swelling of your face and throat  . A fast heartbeat  . A bad rash all over your body  . Dizziness and weakness    Immunizations Administered    Name Date Dose VIS Date Route   Pfizer COVID-19 Vaccine 11/17/2019  5:46 PM 0.3 mL 09/16/2019 Intramuscular   Manufacturer: [place]   Lot: [id number]   [place]: [id number]

## 2019-11-18 ENCOUNTER — Other Ambulatory Visit: Payer: Self-pay

## 2019-11-18 ENCOUNTER — Ambulatory Visit (INDEPENDENT_AMBULATORY_CARE_PROVIDER_SITE_OTHER): Payer: Medicare HMO

## 2019-11-18 DIAGNOSIS — E538 Deficiency of other specified B group vitamins: Secondary | ICD-10-CM | POA: Diagnosis not present

## 2019-11-18 MED ORDER — CYANOCOBALAMIN 1000 MCG/ML IJ SOLN
1000.0000 ug | INTRAMUSCULAR | Status: DC
  Administered 2019-11-18 – 2020-05-24 (×7): 1000 ug via INTRAMUSCULAR

## 2019-11-25 ENCOUNTER — Ambulatory Visit (INDEPENDENT_AMBULATORY_CARE_PROVIDER_SITE_OTHER): Payer: Medicare HMO

## 2019-11-25 ENCOUNTER — Other Ambulatory Visit: Payer: Self-pay

## 2019-11-25 ENCOUNTER — Telehealth: Payer: Self-pay | Admitting: Family Medicine

## 2019-11-25 DIAGNOSIS — E538 Deficiency of other specified B group vitamins: Secondary | ICD-10-CM

## 2019-11-25 NOTE — Telephone Encounter (Signed)
Pt's wife aware.

## 2019-11-25 NOTE — Telephone Encounter (Signed)
I would up his insulin by 3 units, but overall fasting sugars look pretty good all things considered.

## 2019-11-25 NOTE — Telephone Encounter (Signed)
Patient dropped off fasting glucose readings. His readings were  11/23/2019- 155 11/24/2019- 194 11/25/2019- 154  Please advise

## 2019-11-28 DIAGNOSIS — R972 Elevated prostate specific antigen [PSA]: Secondary | ICD-10-CM | POA: Diagnosis not present

## 2019-12-09 ENCOUNTER — Telehealth: Payer: Self-pay | Admitting: Family Medicine

## 2019-12-09 NOTE — Telephone Encounter (Signed)
Verify insulin dose (I think he is on 10) and increase by 7 units a day.

## 2019-12-09 NOTE — Telephone Encounter (Signed)
Pt is on 7 units per Dr. Dennard Schaumann increase to 14 units and keep track of levels x 1 week and report back to [place] with those readings.  Cam aware of recommedations

## 2019-12-09 NOTE — Telephone Encounter (Signed)
Pt's daughter called and states that the pt's bs has jumped up over the last few days and she would like to know what she should do about it?  200,201,206,281 this am

## 2019-12-16 ENCOUNTER — Telehealth: Payer: Self-pay

## 2019-12-16 NOTE — Telephone Encounter (Signed)
Ms. Ryan Gilmore notified. She states that pt's bs was in the 200s last week and she gave him 10 units of basaglar. Broadnax also states that when bs is in the 100's, she stopped his glipizide and then pt's bs went back into the 200s. Broadnax explained that glipizide can not be stopped due to high bs numbers. She states that pt has an appt on 03/19 with endocrinologist at the [place].

## 2019-12-23 ENCOUNTER — Other Ambulatory Visit: Payer: Self-pay

## 2019-12-23 ENCOUNTER — Ambulatory Visit (INDEPENDENT_AMBULATORY_CARE_PROVIDER_SITE_OTHER): Payer: Medicare HMO

## 2019-12-23 DIAGNOSIS — E538 Deficiency of other specified B group vitamins: Secondary | ICD-10-CM | POA: Diagnosis not present

## 2019-12-23 MED ORDER — CYANOCOBALAMIN 1000 MCG/ML IJ SOLN: 1000.0000 ug | Freq: Once | INTRAMUSCULAR | Status: DC

## 2020-01-23 ENCOUNTER — Ambulatory Visit (INDEPENDENT_AMBULATORY_CARE_PROVIDER_SITE_OTHER): Payer: Medicare HMO

## 2020-01-23 ENCOUNTER — Other Ambulatory Visit: Payer: Self-pay

## 2020-01-23 DIAGNOSIS — E538 Deficiency of other specified B group vitamins: Secondary | ICD-10-CM

## 2020-02-21 DIAGNOSIS — R972 Elevated prostate specific antigen [PSA]: Secondary | ICD-10-CM | POA: Diagnosis not present

## 2020-02-24 ENCOUNTER — Ambulatory Visit (INDEPENDENT_AMBULATORY_CARE_PROVIDER_SITE_OTHER): Payer: Medicare HMO

## 2020-02-24 ENCOUNTER — Other Ambulatory Visit: Payer: Self-pay

## 2020-02-24 DIAGNOSIS — E538 Deficiency of other specified B group vitamins: Secondary | ICD-10-CM | POA: Diagnosis not present

## 2020-02-24 NOTE — Progress Notes (Signed)
Administered b12 injection in pt's L deltoid. Pt tolerated well.

## 2020-02-28 DIAGNOSIS — R972 Elevated prostate specific antigen [PSA]: Secondary | ICD-10-CM | POA: Diagnosis not present

## 2020-02-28 DIAGNOSIS — N402 Nodular prostate without lower urinary tract symptoms: Secondary | ICD-10-CM | POA: Diagnosis not present

## 2020-03-23 ENCOUNTER — Ambulatory Visit: Payer: Medicare HMO

## 2020-03-23 ENCOUNTER — Other Ambulatory Visit: Payer: Self-pay

## 2020-03-26 ENCOUNTER — Ambulatory Visit: Payer: Medicare HMO

## 2020-04-03 ENCOUNTER — Other Ambulatory Visit: Payer: Self-pay | Admitting: Family Medicine

## 2020-04-20 ENCOUNTER — Ambulatory Visit (INDEPENDENT_AMBULATORY_CARE_PROVIDER_SITE_OTHER): Payer: Medicare HMO

## 2020-04-20 ENCOUNTER — Other Ambulatory Visit: Payer: Self-pay

## 2020-04-20 DIAGNOSIS — E538 Deficiency of other specified B group vitamins: Secondary | ICD-10-CM | POA: Diagnosis not present

## 2020-04-20 NOTE — Progress Notes (Signed)
Pt came in for b12 injection. Administered in R deltoid. Pt tolerated well.

## 2020-05-01 ENCOUNTER — Telehealth: Payer: Self-pay

## 2020-05-01 NOTE — Telephone Encounter (Signed)
Pt daughter called and asked if her Dad can stop taking his eliquis, he's been taking it for a year.

## 2020-05-03 NOTE — Telephone Encounter (Signed)
Spoke with Pt wife she verblaized understanding to stop Ryan Gilmore's eliquis.

## 2020-05-03 NOTE — Telephone Encounter (Signed)
Yes, he can stop eliquis.

## 2020-05-09 DIAGNOSIS — Z20822 Contact with and (suspected) exposure to covid-19: Secondary | ICD-10-CM | POA: Diagnosis not present

## 2020-05-11 ENCOUNTER — Ambulatory Visit: Payer: Medicare HMO | Admitting: Family Medicine

## 2020-05-18 ENCOUNTER — Emergency Department (HOSPITAL_COMMUNITY)
Admission: EM | Admit: 2020-05-18 | Discharge: 2020-05-18 | Disposition: A | Discharge status: Home / Self Care | Payer: Medicare HMO | Attending: Emergency Medicine | Admitting: Emergency Medicine

## 2020-05-18 ENCOUNTER — Emergency Department (HOSPITAL_COMMUNITY): Payer: Medicare HMO

## 2020-05-18 ENCOUNTER — Other Ambulatory Visit: Payer: Self-pay

## 2020-05-18 DIAGNOSIS — E860 Dehydration: Secondary | ICD-10-CM | POA: Diagnosis not present

## 2020-05-18 DIAGNOSIS — J189 Pneumonia, unspecified organism: Secondary | ICD-10-CM | POA: Diagnosis not present

## 2020-05-18 DIAGNOSIS — Z7901 Long term (current) use of anticoagulants: Secondary | ICD-10-CM | POA: Diagnosis not present

## 2020-05-18 DIAGNOSIS — E119 Type 2 diabetes mellitus without complications: Secondary | ICD-10-CM | POA: Insufficient documentation

## 2020-05-18 DIAGNOSIS — F0390 Unspecified dementia without behavioral disturbance: Secondary | ICD-10-CM

## 2020-05-18 DIAGNOSIS — U071 COVID-19: Secondary | ICD-10-CM | POA: Diagnosis not present

## 2020-05-18 DIAGNOSIS — E039 Hypothyroidism, unspecified: Secondary | ICD-10-CM | POA: Diagnosis not present

## 2020-05-18 DIAGNOSIS — Z7984 Long term (current) use of oral hypoglycemic drugs: Secondary | ICD-10-CM | POA: Diagnosis not present

## 2020-05-18 DIAGNOSIS — I10 Essential (primary) hypertension: Secondary | ICD-10-CM | POA: Diagnosis not present

## 2020-05-18 LAB — URINALYSIS, ROUTINE W REFLEX MICROSCOPIC
Bilirubin Urine: NEGATIVE
Glucose, UA: >=500 mg/dL — AB
Hgb urine dipstick: NEGATIVE
Ketones, ur: 20 mg/dL — AB
Leukocytes,Ua: NEGATIVE
Nitrite: NEGATIVE
Protein, ur: NEGATIVE mg/dL
Specific Gravity, Urine: 1.027 (ref 1.005–1.030)
pH: 5 (ref 5.0–8.0)

## 2020-05-18 LAB — COMPREHENSIVE METABOLIC PANEL
ALT: 15 U/L (ref 0–44)
AST: 19 U/L (ref 15–41)
Albumin: 3.6 g/dL (ref 3.5–5.0)
Alkaline Phosphatase: 44 U/L (ref 38–126)
Anion gap: 12 (ref 5–15)
BUN: 31 mg/dL — ABNORMAL HIGH (ref 8–23)
CO2: 23 mmol/L (ref 22–32)
Calcium: 9 mg/dL (ref 8.9–10.3)
Chloride: 104 mmol/L (ref 98–111)
Creatinine, Ser: 1.62 mg/dL — ABNORMAL HIGH (ref 0.61–1.24)
GFR calc Af Amer: 45 mL/min — ABNORMAL LOW (ref >60)
GFR calc non Af Amer: 38 mL/min — ABNORMAL LOW (ref >60)
Glucose, Bld: 238 mg/dL — ABNORMAL HIGH (ref 70–99)
Potassium: 5.7 mmol/L — ABNORMAL HIGH (ref 3.5–5.1)
Sodium: 139 mmol/L (ref 135–145)
Total Bilirubin: 1 mg/dL (ref 0.3–1.2)
Total Protein: 7.4 g/dL (ref 6.5–8.1)

## 2020-05-18 LAB — CBC
HCT: 44.3 % (ref 39.0–52.0)
Hemoglobin: 14.2 g/dL (ref 13.0–17.0)
MCH: 29.6 pg (ref 26.0–34.0)
MCHC: 32.1 g/dL (ref 30.0–36.0)
MCV: 92.3 fL (ref 80.0–100.0)
Platelets: 292 10*3/uL (ref 150–400)
RBC: 4.8 MIL/uL (ref 4.22–5.81)
RDW: 14.1 % (ref 11.5–15.5)
WBC: 10 10*3/uL (ref 4.0–10.5)
nRBC: 0 % (ref 0.0–0.2)

## 2020-05-18 MED ORDER — SODIUM CHLORIDE 0.9 % IV SOLN: Freq: Once | INTRAVENOUS | Status: AC

## 2020-05-18 MED ORDER — SODIUM CHLORIDE 0.9 % IV BOLUS
500.0000 mL | Freq: Once | INTRAVENOUS | Status: AC
  Administered 2020-05-18: 500 mL via INTRAVENOUS

## 2020-05-18 NOTE — ED Provider Notes (Signed)
Care of the patient assumed at the change of shift. Patient with known Covid has been doing relatively well but not eating or drinking much the last few days. Labs show mild AKI with borderline BP. Patient getting IVF, PO Trial and reassess for possible discharge home with standard quarantine recommendations.  Physical Exam  BP 102/65   Pulse 84   Temp 98.1 F (36.7 C)   Resp 18   Ht 5\' 11"  (1.803 m)   Wt 78 kg   SpO2 97%   BMI 23.98 kg/m   Physical Exam Awake and alert, no distress ED Course/Procedures     Procedures  MDM  Will recheck after IVF and PO trial.   4:23 PM Patient tolerating PO Fluids and has ambulated at baseline. No indication for admission. Patient would like to go home. PCP followup and RTED as needed if symptoms worsen.        Truddie Hidden, MD 05/18/20 [phone]

## 2020-05-18 NOTE — Discharge Instructions (Signed)
1. Continue to provide fluids and small snacks frequently. 2. It appears all of your medications that you are currently taken are appropriate to continue. At this time, you should not be on any medications to lower blood pressure. No medications to increase potassium. 3. There did not appear to be any problems with your lungs due to Covid. Your oxygen level is very good and your chest x-ray is clear.

## 2020-05-18 NOTE — ED Provider Notes (Signed)
[facility] DEPT Provider Note   CSN: [id number] Arrival date & time: 05/18/20  1019     History Chief Complaint  Patient presents with  . Dehydration    Ryan Gilmore is a 86 y.o. male.  HPI Patient lives with his daughter. He was diagnosed with Covid 8\4. She reports that he has been eating less and not drinking much. She thinks he is getting dehydrated. She reports that due to his dementia he will often refuse to eat because he thinks he is already eaten. He has had some per persistent cough. Patient denies any complaints. He does have dementia. He is pleasant but has no recall for his current illness. He focuses on when he will be going home.    Past Medical History:  Diagnosis Date  . Bleeding duodenal ulcer    ?  1980s  . Dementia ([place])   . Diabetes mellitus without complication ([place])   . Hyperlipidemia     Patient Active Problem List   Diagnosis Date Noted  . DVT, recurrent, lower extremity, chronic, bilateral   . Hypomagnesemia   . Palliative care by specialist   . DNR (do not resuscitate) discussion   . Acute hypoxemic respiratory failure ([place])   . Acute on chronic respiratory failure with hypoxia ([place])   . Vascular dementia without behavioral disturbance ([place])   . FTT (failure to thrive) in adult   . Acute pulmonary embolism without acute cor pulmonale ([place]) 04/26/2019  . Essential hypertension 04/26/2019  . AKI (acute kidney injury) ([place]) 04/26/2019  . Chronic midline low back pain without sciatica 10/12/2017  . Diabetes mellitus type 2, noninsulin dependent ([place]) 06/28/2013  . Other and unspecified hyperlipidemia 06/28/2013    Past Surgical History:  Procedure Laterality Date  . APPENDECTOMY         Family History  Problem Relation Age of Onset  . Heart disease Father   . Diabetes Brother     Social History   Tobacco Use  . Smoking status: Never Smoker  . Smokeless tobacco: Current User    Types: Chew    Substance Use Topics  . Alcohol use: Not Currently  . Drug use: Never    Home Medications Prior to Admission medications   Medication Sig Start Date End Date Taking? Authorizing Provider  donepezil (ARICEPT) 5 MG tablet TAKE 1 TABLET BY MOUTH AT BEDTIME Patient taking differently: Take 5 mg by mouth at bedtime.  04/04/19  Yes [name], MD  glipiZIDE (GLUCOTROL XL) 10 MG 24 hr tablet Take 1 tablet by mouth once daily with breakfast Patient taking differently: Take 10 mg by mouth in the morning, at noon, and at bedtime.  04/03/20  Yes [name], MD  metFORMIN (GLUCOPHAGE) 500 MG tablet TAKE 2 TABLETS BY MOUTH TWICE DAILY WITH A MEAL Patient taking differently: Take 1,000 mg by mouth 2 (two) times daily with a meal.  05/09/19  Yes [name], MD  Multiple Vitamin (MULTIVITAMIN) tablet Take 1 tablet by mouth daily.   Yes [provider]  rosuvastatin (CRESTOR) 20 MG tablet Take 20 mg by mouth daily.   Yes [provider]  sitaGLIPtin (JANUVIA) 100 MG tablet Take 1 tablet (100 mg total) by mouth daily. 05/06/19  Yes [name], MD  apixaban (ELIQUIS) 5 MG TABS tablet Take 1 tablet (5 mg total) by mouth 2 (two) times daily. Patient not taking: Reported on 05/18/2020 05/31/19   [name], MD  Blood  Glucose Monitoring Suppl (BLOOD GLUCOSE SYSTEM PAK) KIT Please dispense based on patient and insurance preference. Use as directed to monitor FSBS 2x. Dx: E11.E11.65. 03/24/19   [name], MD  Glucose Blood (BLOOD GLUCOSE TEST STRIPS) STRP Please dispense one touch verio flex lancets, and testing strips. Use as directed to monitor FSBS 2x. Dx: E11.E11.65. 03/30/19   [name], MD  Lancets MISC Please dispense based on patient and insurance preference. Use as directed to monitor FSBS 2x. Dx: E11.E11.65. 03/24/19   [name], MD  lisinopril (ZESTRIL) 10 MG tablet Take 1 tablet by mouth once daily Patient not taking: Reported on  05/18/2020 06/15/19   [name], MD  pioglitazone (ACTOS) 30 MG tablet Take 1 tablet (30 mg total) by mouth daily. Patient not taking: Reported on 05/18/2020 05/09/19   [name], MD  pravastatin (PRAVACHOL) 40 MG tablet Take 1 tablet by mouth once daily Patient not taking: Reported on 05/18/2020 06/03/19   [name], MD    Allergies    Patient has no known allergies.  Review of Systems   Review of Systems Level 5 caveat cannot obtain review of systems due to dementia. Physical Exam Updated Vital Signs BP 102/65   Pulse 84   Temp 98.1 F (36.7 C)   Resp 18   Ht 5' 11"  (1.803 m)   Wt 78 kg   SpO2 97%   BMI 23.98 kg/m   Physical Exam Constitutional:      Appearance: He is well-developed.     Comments: Patient is alert. He has no respiratory distress.  HENT:     Head: Normocephalic and atraumatic.     Mouth/Throat:     Pharynx: Oropharynx is clear.  Eyes:     Pupils: Pupils are equal, round, and reactive to light.  Cardiovascular:     Rate and Rhythm: Normal rate and regular rhythm.     Heart sounds: Normal heart sounds.  Pulmonary:     Effort: Pulmonary effort is normal.     Breath sounds: Normal breath sounds.     Comments: Occasional dry cough. No respiratory distress. Abdominal:     General: Bowel sounds are normal. There is no distension.     Palpations: Abdomen is soft.     Tenderness: There is no abdominal tenderness.  Musculoskeletal:        General: Normal range of motion.     Cervical back: Neck supple.  Skin:    General: Skin is warm and dry.  Neurological:     Mental Status: He is alert.     GCS: GCS eye subscore is 4. GCS verbal subscore is 5. GCS motor subscore is 6.     Coordination: Coordination normal.     Comments: Patient is alert and pleasant. His speech is clear but he does not have recall for historical events. He will follow commands. He is watching television and paying attention. No focal motor deficits.  Psychiatric:         Mood and Affect: Mood normal.     ED Results / Procedures / Treatments   Labs (all labs ordered are listed, but only abnormal results are displayed) Labs Reviewed  COMPREHENSIVE METABOLIC PANEL - Abnormal; Notable for the following components:      Result Value   Potassium 5.7 (*)    Glucose, Bld 238 (*)    BUN 31 (*)    Creatinine, Ser 1.62 (*)    GFR calc non Af  Amer 38 (*)    GFR calc Af Amer 45 (*)    All other components within normal limits  URINALYSIS, ROUTINE W REFLEX MICROSCOPIC - Abnormal; Notable for the following components:   Glucose, UA >=500 (*)    Ketones, ur 20 (*)    Bacteria, UA RARE (*)    All other components within normal limits  CBC    EKG EKG Interpretation  Date/Time:  Friday May 18 2020 10:59:22 EDT Ventricular Rate:  84 PR Interval:    QRS Duration: 85 QT Interval:  355 QTC Calculation: 420 R Axis:   77 Text Interpretation: Sinus rhythm Normal ECG No significant change since last tracing Confirmed by Calvert Cantor [phone]) on 05/18/2020 3:46:20 PM   Radiology DG Chest Port 1 View  Result Date: 05/18/2020 CLINICAL DATA:  Cough, COVID pneumonia EXAM: PORTABLE CHEST 1 VIEW COMPARISON:  None. FINDINGS: The heart size and mediastinal contours are within normal limits. Both lungs are clear. The visualized skeletal structures are unremarkable. IMPRESSION: No active disease. Electronically Signed   By: Fidela Salisbury MD   On: 05/18/2020 15:38    Procedures Procedures (including critical care time)  Medications Ordered in ED Medications  0.9 %  sodium chloride infusion ( Intravenous New Bag/Given 05/18/20 1517)  sodium chloride 0.9 % bolus 500 mL (500 mLs Intravenous New Bag/Given 05/18/20 1514)    ED Course  I have reviewed the triage vital signs and the nursing notes.  Pertinent labs & imaging results that were available during my care of the patient were reviewed by me and considered in my medical decision making (see chart for  details).    MDM Rules/Calculators/A&P                          Patient clinically looks well. He has mild AKI and mild hypotension. No respiratory distress. Lungs are clear. 100% on room air. No abdominal pain. At this time, plan will be for rehydration with IV fluids and oral challenge. I have reviewed the history of present illness and plan with the patient's daughter. At this time I suspect he will be stable for discharge. He is not currently taking lisinopril or Eliquis. He is not on any antihypertensives.  Dr. Karle Starch to follow-up on oral fluid challenge and monitor blood pressures. If stable anticipate discharge home with daughter. Final Clinical Impression(s) / ED Diagnoses Final diagnoses:  Dehydration  COVID-19  Dementia without behavioral disturbance, unspecified dementia type [facility])    Rx / [place] Orders ED Discharge Orders    None       Charlesetta Shanks, MD 05/18/20 1550

## 2020-05-18 NOTE — ED Triage Notes (Signed)
Pt arrived via POV< brought by family, hx dementia. Per family, pt COVId (+) august 4th. Cough, back pain progressively worse since. Appetite decreased this week, daughter states pt has not been eating and drinking.

## 2020-05-24 ENCOUNTER — Other Ambulatory Visit: Payer: Self-pay

## 2020-05-24 ENCOUNTER — Ambulatory Visit (INDEPENDENT_AMBULATORY_CARE_PROVIDER_SITE_OTHER): Payer: Medicare HMO | Admitting: Family Medicine

## 2020-05-24 VITALS — BP 90/60 | HR 82 | Temp 98.4°F | Ht 70.0 in | Wt 172.0 lb

## 2020-05-24 DIAGNOSIS — N289 Disorder of kidney and ureter, unspecified: Secondary | ICD-10-CM | POA: Diagnosis not present

## 2020-05-24 DIAGNOSIS — S39012A Strain of muscle, fascia and tendon of lower back, initial encounter: Secondary | ICD-10-CM | POA: Diagnosis not present

## 2020-05-24 DIAGNOSIS — E538 Deficiency of other specified B group vitamins: Secondary | ICD-10-CM

## 2020-05-24 NOTE — Progress Notes (Signed)
Subjective:    Patient ID: Ryan Gilmore, male    DOB: 10-16-1932, 86 y.o.   MRN: [id number]  Back Pain    10/22/17 Patient presents today with his wife to discuss his chronic low back pain.  Patient saw his orthopedist approximately 10 days ago and I have recommended bilateral L4-L5 facet joint injections as well as L5-S1 facet joint injections due to his degenerative disc disease to see if that will help with his low back pain.  [name] treatment is complicated by his underlying dementia as well.  He continues to endorse low back pain.  His wife states that he is in misery.  He primarily sits in a chair unwilling to stand or move due to the pain.  He points to an area just lateral to his lumbar spine to the right side right above the SI joint.  There is no tenderness to palpation in that area.  He does report decreased range of motion.  He denies any sciatica or leg weakness.  He is tried and failed physical therapy.  I did give him a prescription for tramadol 50 mg tablets to take every 8 hours as needed.  Wife has been given it to him every 8 hours and states that it helped some occasionally and at other times provided no relief.  At that time, the plan was:  I explained to the patient and his wife that I was concerned about increasing the strength of his pain medication possibly exacerbating his dementia and also increasing his fall risk.  I explained to his wife that she could use the tramadol every 6 hours as needed.  If he is having severe pain, she did could increase to 100 mg sparingly.  I cautioned her against regular use unless the pain is moderate to severe due to the increased risk of falls and confusion area he has a cortisone injection scheduled for 10 days from now.  Hopefully that will provide him relief as well  10/29/17 Patient recently went to the emergency room complaining of poor appetite and weight loss. Urinalysis showed pyuria. He was started on Keflex for possible urinary  tract infection. Urine culture returned recently showing Enterococcus faecalis. However he was directed to stop his antibiotic by the emergency room today have not taken any further doses of his antibiotic. His wife states that he is now extremely nauseated. He is losing weight. He is not eating. She denies any fevers or chills. He continues to complain of low back pain. He denies any dysuria or hematuria or urgency or frequency. However he is unable to provide an accurate history due to dementia. He does report no appetite, early satiety, weakness, and nausea.  Recently began ozempic at [place].  At that time, my plan was: I am concerned about dehydration. He has lost 4 pounds since I last saw him. I want him to discontinue lisinopril. I also want him to hold Amaryl, metformin because he is not taking adequate by mouth nutrition and I'm concerned about hypoglycemia. Also want him to temporarily discontinue ozempic as this could be affecting his appetite and making him nauseated. Meanwhile start Zofran 4 mg by mouth every 8 hours when necessary nausea and go for an x-ray of the abdomen to rule out bowel obstruction or ileus or fecal impaction. I also believe that some of his nausea and weakness could be due to undiagnosed prostatitis. In an 86 year old man, a simple uncomplicated UTI is very unlikely. For him to have silent  symptoms, I would be concerned that this could be from prostatitis. Therefore I recommended Cipro 500 mg by mouth twice a day for 10 days given the culture confirmed urinary tract infection. Recheck on Monday  11/02/17 The x-ray confirmed a moderate amount of constipation and stool throughout the colon. Family was recommended to use MiraLAX as it was thought that the constipation could be contributing to his poor intake. MiraLAX was ineffective. They did give him 1 dose of linzess which seemed to work better.  [name] weight is up 4 pounds today compared to his last visit. His color looks better.  He has more energy. He is smiling. He denies any abdominal pain or nausea. However his history is limited by his dementia. His wife states that he still not eating very well and he continues to complain of low back pain.  At that time, my plan was: I believe the patient has turned the corner and starting to show signs of improvement. I recommended staying on linzess 145 mcg poqday for constipation unless he develops diarrhea. Continue to refrain from using ozempic as I believe this could be causing his nausea and poor appetite. He truly is starting to appear better. His next dose of the medicine was due on Saturday so really he's only been off the medication for one or 2 days. I would like to see if he continues to improve over the next week off the medication particularly as his prostatitis improves and his constipation improves. Recheck on Monday of next week.  11/09/17 Wt Readings from Last 3 Encounters:  05/24/20 172 lb (78 kg)  05/18/20 171 lb 15.3 oz (78 kg)  11/04/19 172 lb (78 kg)   Clinically, patient seems to be doing much better.  He is gained 7 pounds in the last week.  His appetite is better.  He is eating and drinking more.  His constipation is better on linzess.  He denies any dysuria.  Wife still complains that he is complaining of low back pain.  He is scheduled for epidural steroid injection on February 18 now which may help with this.  She still complains that his energy level and drive is also lower than normal.  However I believe a lot of this could be dementia related.  Patient states that he feels fine and he denies any complaints today.  He is smiling and interactive and appears healthy and well-nourished.  At that time, my plan was:  Clinically the patient looks much better.  His nausea and weight loss has completely resolved.  I believe his poor appetite was likely secondary to ozempic.  I have recommended today discontinue that medication permanently.  I would like him to resume  metformin as his appetite has improved.  I have recommended that his wife monitor his blood sugars.  If his blood sugars rise greater than 200, I would then resume glimepiride.  However I want her to keep him away from the glimepiride at the present time.  Stay on linzess 72 mcg p.o. daily for chronic constipation.  Repeat urinalysis and urine culture to ensure resolution of prostatitis.  Clinically the patient appears to be back to his baseline.  11/26/17 The patient's wife is starting to see elevated blood sugars.  His blood sugar this morning was 220 fasting and shortly after lunch, it was greater than 300.  He denies any polyuria, polydipsia, or blurry vision.  He has been taking Actos.  He is also resumed his metformin however he is still  off glimepiride as well as ozempic as stated in earlier office visits.  AT that time, my plan was: Resume glimepiride and recheck blood sugars in 1 week.  Ultimately I would like to keep his blood sugars between 100-200 is given his age and medical comorbidities.  If not in that range, patient would then benefit from adding [place].  02/16/18 Patient is here today with his wife.  She reports blood sugars between 203 100 fasting in the morning.  She states that she is giving him metformin 500 twice a day despite the fact I stopped the medication earlier this year.  She is also giving him Amaryl and Actos as prescribed.  Sugars typically run between 200-250 most days.  She is also concerned because all he does is sit around and take naps.  He has no drive.  He has no motivation.  She will tell him to cut the grass and he states later and then forgets and never does it.  She tells him to shave, he will tell her later and then forget and never does.  She will ask him to go to town or go shopping with her, he will state later and then simply take a nap.  Due to his dementia, he is unable to provide additional history.  At that time, my plan was: Discontinue metformin and  replace with Janumet XR 50/1000, 2 tablets p.o. every morning.  Recheck blood sugars in 1 month.  Goal blood sugars are between 100-200.  Consider treating the patient for depression.  I believe his apathy is a combination of age, memory loss, and may be some mild underlying depression.  Consider adding low-dose SSRI at next visit to see if this will help  03/16/18 [name] wife states that his sugars have improved on Janumet.  She is still given him 2 tablets of this every morning in addition to Actos and glipizide.  Blood sugars are under 200 when she checks them.  She denies any polyuria, polydipsia, or blurry vision.  However she states that the patient is constantly complaining about pain in his lower back.  Pain is located around the level of L5 and is to the right of midline.  There is no palpable nodule or mass in that area.  He denies any numbness or tingling in his legs.  He denies any weakness in his legs.  He denies any bowel or bladder incontinence.  Work-up has included a referral to an orthopedist, as well as an x-ray of the lumbar spine his results are dictated below: 2 views of the lumbar spine show no acute findings in terms of fractures  or malalignment. There is degenerative changes but is only mild to  moderate at several levels but disc heights are decently maintained and  alignment is decently maintained.  At that time, my plan was: I have recommended scheduled dosing of his pain medication to try to better manage his pain then just chasing the pain medication with the occasional Tylenol.  Therefore I recommended tramadol 50 mg every morning at breakfast, Tylenol ES every day at lunch, tramadol 50 mg at supper, and another extra strength Tylenol at bedtime.  I hope that with scheduled dosing and spreading the medication out throughout the day that we will achieve better pain control with minimal risk of side effects and that this will facilitate the patient's ability to get up and  be more active and enjoy life more.  Recheck in 2 weeks.  If pain is not  improving, pain with then be out of proportion to exam, and we may want to consider more in-depth imaging to rule out underlying bone pathology in that area  04/23/18 [name] hemoglobin A1c was 8.9 and his last visit on June 11.  His random sugar was over 300.  Therefore I recommended that we add Victoza 1.2 mg subcu daily to his regimen and recheck his blood sugars in 1 month.  They are here today to reassess.  Unfortunately the patient's dementia and the patient's wife's dementia make it extremely difficult.  I began our conversation by asking how his sugars are doing.  Wife states that they are out of control.  She states that his sugars are in the 200s.  However upon further questioning, the patient's wife states that she is not actually checking his sugars that he is checking his sugars himself and reporting them to her.  When asked to see his glucometer and review the memory, there is only 3 sugars since I last saw the patient.  The last dated blood sugar was on June 17.  He had not started Vick toes until 2 days prior to that.  There have been no blood sugars fasting are otherwise for the last month.  Therefore it is impossible to determine how well his sugars are being managed on the Victoza.  [name] wife also states that he is in constant pain in his back.  Please see above the discussion of this.  However I question her as to how she is getting his pain medication.  She forgot that he had pain medication and is only giving it when he requests it.  She is not doing the scheduled strategy that we had discussed at his last visit due to the fact that she forgot our discussion.  She too is suffering from dementia.  At that time, my plan was: I have asked the patient's wife to begin checking his sugars for him and recording the fasting and 2-hour postprandial sugars for 1 week and then contact me in 1 week so we can determine how  well the toes is managing his sugars.  In general his sugars are between 102 100 I am satisfied.  Regarding his low back pain, I have recommended that she perform the scheduled pain medication that we had discussed, as tramadol in the morning, Tylenol at lunch, tramadol at dinnertime, and another Tylenol in the evening.  Meanwhile I will schedule the patient for an MRI given the fact his back pain has been present now for almost a year, it is not improving despite conservative measures  06/10/18 Originally insurance declined his MRI however we were able to get it approved.  MRI was scheduled for July 30.  Apparently the patient did not keep the appointment and never went for the MRI.  He is here today with his wife.  She is stating that he is in constant pain.  When I examined the patient however he is smiling.  He is laughing as I discussed his pain with him.  He is able to get off the exam table and walk up and down the hallway with no obvious signs of pain.  There is mild tenderness to palpation in the lumbar paraspinal muscles around the level of L4 however it is very mild.  When I question the patient he states is a nagging pain.  He states that is not severe.  His dementia makes history difficult however I presented him with a scale of 1-10  and gave examples of what would constitute a 1 and what would constitute a 10.  He states that his pain is closer to a 1.  Muscle strength is 5/5 equal and symmetric in the upper and lower extremities.  Reflexes are normal.  There is no evidence of cauda equina syndrome.  He is denying any fevers or chills.  When I asked his wife why she feels that he is in constant pain, she states that whenever she asked him to do anything, he states no my back hurts.  It seems like this may be a easy way for him to avoid doing or going places that he does not want to go.  I am concerned that his dementia may cause him to state this even when he is not in severe pain.  I am also  concerned about the wife's dementia as she obviously forgot the MRI and also does not recall having this discussion on several occasions.  At that time, my plan was: [name] exam today is completely benign.  In fact he smiles when I questioned him about his back pain and laughs at times when he describes his pain.  It seems like the pain is very minimal and certainly is not causing him terrible discomfort.  I do believe he has a constant low-grade nagging pain in his back likely due to his arthritic changes.  However I believe that he uses this is a "easy excuse" when he does not want to do things that his wife asked him to do primarily because of his dementia and his inability to express himself.  In any regards, he does not seem to be in any pain today and certainly not to the point that I would prescribe him opiates or recommend surgery.  I will reschedule his MRI that he previously did not keep his appointment for.  I will also obtain an x-ray to rule out acute findings that would explain constant low back pain such as bony cancers or fractures.  I try to reassure his wife that we may have to live with a low-grade level of pain that he is experiencing.  05/24/20 Patient recently had Covid.  He had an approximately 14 days ago.  He went to the hospital last week and was found to have renal insufficiency and was given IV fluids.  He is here today for follow-up.  He still looks dehydrated.  His blood pressure is quite low.  However he is feeling better.  He denies any cough or shortness of breath or chest pain.  His family states that he is eating better.  His biggest issue has been low back pain.  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  He has not tried physical therapy.  His family is interested in physical therapy at the present time Past Medical History:  Diagnosis Date  . Bleeding duodenal ulcer    ?  1980s  . Dementia ([place])   . Diabetes mellitus without  complication ([place])   . Hyperlipidemia    Past Surgical History:  Procedure Laterality Date  . APPENDECTOMY     Current Outpatient Medications on File Prior to Visit  Medication Sig Dispense Refill  . Blood Glucose Monitoring Suppl (BLOOD GLUCOSE SYSTEM PAK) KIT Please dispense based on patient and insurance preference. Use as directed to monitor FSBS 2x. Dx: E11.E11.65. 1 kit 1  . donepezil (ARICEPT) 5 MG tablet TAKE 1 TABLET BY MOUTH AT BEDTIME (  Patient taking differently: Take 5 mg by mouth at bedtime. ) 90 tablet 3  . glipiZIDE (GLUCOTROL XL) 10 MG 24 hr tablet Take 1 tablet by mouth once daily with breakfast (Patient taking differently: Take 10 mg by mouth in the morning, at noon, and at bedtime. ) 90 tablet 0  . Glucose Blood (BLOOD GLUCOSE TEST STRIPS) STRP Please dispense one touch verio flex lancets, and testing strips. Use as directed to monitor FSBS 2x. Dx: T90.Z00.92. 100 strip 11  . Lancets MISC Please dispense based on patient and insurance preference. Use as directed to monitor FSBS 2x. Dx: E11.E11.65. 100 each 3  . metFORMIN (GLUCOPHAGE) 500 MG tablet TAKE 2 TABLETS BY MOUTH TWICE DAILY WITH A MEAL (Patient taking differently: Take 1,000 mg by mouth 2 (two) times daily with a meal. ) 360 tablet 1  . Multiple Vitamin (MULTIVITAMIN) tablet Take 1 tablet by mouth daily.    . rosuvastatin (CRESTOR) 20 MG tablet Take 20 mg by mouth daily.    . sitaGLIPtin (JANUVIA) 100 MG tablet Take 1 tablet (100 mg total) by mouth daily. 90 tablet 3  . pioglitazone (ACTOS) 30 MG tablet Take 1 tablet (30 mg total) by mouth daily. (Patient not taking: Reported on 05/18/2020) 90 tablet 1   Current Facility-Administered Medications on File Prior to Visit  Medication Dose Route Frequency Provider Last Rate Last Admin  . cyanocobalamin ((VITAMIN B-12)) injection 1,000 mcg  1,000 mcg Intramuscular Q30 days [name], MD   1,000 mcg at 04/20/20 1117   No Known Allergies Social History    Socioeconomic History  . Marital status: Married    Spouse name: Not on file  . Number of children: Not on file  . Years of education: Not on file  . Highest education level: Not on file  Occupational History  . Not on file  Tobacco Use  . Smoking status: Never Smoker  . Smokeless tobacco: Current User    Types: Chew  Substance and Sexual Activity  . Alcohol use: Not Currently  . Drug use: Never  . Sexual activity: Not on file  Other Topics Concern  . Not on file  Social History Narrative  . Not on file   Social Determinants of Health   Financial Resource Strain:   . Difficulty of Paying Living Expenses: Not on file  Food Insecurity:   . Worried About Charity fundraiser in the Last Year: Not on file  . Ran Out of Food in the Last Year: Not on file  Transportation Needs:   . Lack of Transportation (Medical): Not on file  . Lack of Transportation (Non-Medical): Not on file  Physical Activity:   . Days of Exercise per Week: Not on file  . Minutes of Exercise per Session: Not on file  Stress:   . Feeling of Stress : Not on file  Social Connections:   . Frequency of Communication with Friends and Family: Not on file  . Frequency of Social Gatherings with Friends and Family: Not on file  . Attends Religious Services: Not on file  . Active Member of Clubs or Organizations: Not on file  . Attends Archivist Meetings: Not on file  . Marital Status: Not on file  Intimate Partner Violence:   . Fear of Current or Ex-Partner: Not on file  . Emotionally Abused: Not on file  . Physically Abused: Not on file  . Sexually Abused: Not on file      Review of  Systems  Musculoskeletal: Positive for back pain.  All other systems reviewed and are negative.      Objective:   Physical Exam Vitals reviewed.  Cardiovascular:     Rate and Rhythm: Normal rate and regular rhythm.     Heart sounds: Normal heart sounds.  Pulmonary:     Effort: Pulmonary effort is  normal. No respiratory distress.     Breath sounds: Normal breath sounds. No wheezing or rales.  Musculoskeletal:     Lumbar back: No swelling, edema, deformity, spasms, tenderness or bony tenderness. Decreased range of motion.           Assessment & Plan:   Strain of lumbar region, initial encounter - Plan: Ambulatory referral to Physical Therapy, DG Lumbar Spine Complete  Renal insufficiency - Plan: COMPLETE METABOLIC PANEL WITH GFR, CBC with Differential/Platelet Given his age and elevated PSA I would repeat an x-ray of the lumbar spine to rule out any bone cancer.  However I recommended physical therapy.  We could consider facet joint injections at L5-S1 if physical therapy is ineffective.  Meanwhile I will repeat a CBC and a CMP given his recent renal insufficiency after battling Covid.  I encouraged the family to push Gatorade/G0 because of diabetes to help treat his dehydration

## 2020-05-25 LAB — CBC WITH DIFFERENTIAL/PLATELET
Absolute Monocytes: 679 cells/uL (ref 200–950)
Basophils Absolute: 35 cells/uL (ref 0–200)
Basophils Relative: 0.3 %
Eosinophils Absolute: 257 cells/uL (ref 15–500)
Eosinophils Relative: 2.2 %
HCT: 43.3 % (ref 38.5–50.0)
Hemoglobin: 14.2 g/dL (ref 13.2–17.1)
Lymphs Abs: 5183 cells/uL — ABNORMAL HIGH (ref 850–3900)
MCH: 29.6 pg (ref 27.0–33.0)
MCHC: 32.8 g/dL (ref 32.0–36.0)
MCV: 90.2 fL (ref 80.0–100.0)
MPV: 10.7 fL (ref 7.5–12.5)
Monocytes Relative: 5.8 %
Neutro Abs: 5546 cells/uL (ref 1500–7800)
Neutrophils Relative %: 47.4 %
Platelets: 440 10*3/uL — ABNORMAL HIGH (ref 140–400)
RBC: 4.8 10*6/uL (ref 4.20–5.80)
RDW: 13.2 % (ref 11.0–15.0)
Total Lymphocyte: 44.3 %
WBC: 11.7 10*3/uL — ABNORMAL HIGH (ref 3.8–10.8)

## 2020-05-25 LAB — COMPLETE METABOLIC PANEL WITH GFR
AG Ratio: 1.5 (calc) (ref 1.0–2.5)
ALT: 14 U/L (ref 9–46)
AST: 17 U/L (ref 10–35)
Albumin: 4 g/dL (ref 3.6–5.1)
Alkaline phosphatase (APISO): 49 U/L (ref 35–144)
BUN/Creatinine Ratio: 14 (calc) (ref 6–22)
BUN: 18 mg/dL (ref 7–25)
CO2: 23 mmol/L (ref 20–32)
Calcium: 9.7 mg/dL (ref 8.6–10.3)
Chloride: 105 mmol/L (ref 98–110)
Creat: 1.3 mg/dL — ABNORMAL HIGH (ref 0.70–1.11)
GFR, Est African American: 58 mL/min/{1.73_m2} — ABNORMAL LOW (ref >=60)
GFR, Est Non African American: 50 mL/min/{1.73_m2} — ABNORMAL LOW (ref >=60)
Globulin: 2.7 g/dL (calc) (ref 1.9–3.7)
Glucose, Bld: 148 mg/dL — ABNORMAL HIGH (ref 65–99)
Potassium: 5.1 mmol/L (ref 3.5–5.3)
Sodium: 140 mmol/L (ref 135–146)
Total Bilirubin: 0.4 mg/dL (ref 0.2–1.2)
Total Protein: 6.7 g/dL (ref 6.1–8.1)

## 2020-06-06 ENCOUNTER — Encounter (HOSPITAL_COMMUNITY): Payer: Self-pay | Admitting: Physical Therapy

## 2020-06-06 ENCOUNTER — Ambulatory Visit (HOSPITAL_COMMUNITY): Payer: Medicare HMO | Attending: Family Medicine | Admitting: Physical Therapy

## 2020-06-06 ENCOUNTER — Other Ambulatory Visit: Payer: Self-pay

## 2020-06-06 DIAGNOSIS — G8929 Other chronic pain: Secondary | ICD-10-CM | POA: Insufficient documentation

## 2020-06-06 DIAGNOSIS — M545 Low back pain, unspecified: Secondary | ICD-10-CM

## 2020-06-06 DIAGNOSIS — R293 Abnormal posture: Secondary | ICD-10-CM | POA: Insufficient documentation

## 2020-06-06 NOTE — Therapy (Signed)
[facility] [address] [place], [name], [address] Phone: [phone]   Fax:  [phone]  Physical Therapy Evaluation  Patient Details  Name: Ryan Gilmore MRN: [id number] Date of Birth: July 03, 1933 Referring Provider (PT): Jenna Luo MD    Encounter Date: 06/06/2020   PT End of Session - 06/06/20 1149    Visit Number 1    Number of Visits 8    Date for PT Re-Evaluation 07/06/20    Authorization Type [employer] HMO    Authorization Time Period submitted for 8 visits check auth    Progress Note Due on Visit 8    PT Start Time 1030    PT Stop Time 1120    PT Time Calculation (min) 50 min    Activity Tolerance Patient tolerated treatment well    Behavior During Therapy [facility] for tasks assessed/performed           Past Medical History:  Diagnosis Date  . Bleeding duodenal ulcer    ?  1980s  . Dementia ([place])   . Diabetes mellitus without complication ([place])   . Hyperlipidemia     Past Surgical History:  Procedure Laterality Date  . APPENDECTOMY      There were no vitals filed for this visit.    Subjective Assessment - 06/06/20 1046    Subjective Patient presents to physical therapy with his daughter, with complaint of low back pain. Patient says pain began about 2 years ago insidiously. Patient has not had recent imaging. Patients daughter states she would like her father to learn some exercises and possibly some stretching to help with back pain and stiffness. Patient denies taking any current medication for his back.    Patient is accompained by: Family member   Daughter   Limitations Lifting;Standing;Walking;House hold activities    Patient Stated Goals help with back pain    Currently in Pain? Yes    Pain Score 6     Pain Location Back    Pain Orientation Posterior;Lower    Pain Descriptors / Indicators Aching;Dull    Pain Type Chronic pain    Pain Onset More than a month ago    Pain Frequency Intermittent      Aggravating Factors  standing, walking, lifting    Pain Relieving Factors sitting, rest    Effect of Pain on Daily Activities Limits              [facility] PT Assessment - 06/06/20 0001      Assessment   Medical Diagnosis LBP     Referring Provider (PT) Jenna Luo MD     Onset Date/Surgical Date --   >2 years    Prior Therapy Yes about 5 years ago       Precautions   Precautions None      Balance Screen   Has the patient fallen in the past 6 months No      [place] residence    Living Arrangements Spouse/significant other      Prior Function   Level of [place] with basic ADLs      Cognition   Overall Cognitive Status Within Functional Limits for tasks assessed      Observation/Other Assessments   Focus on Therapeutic Outcomes (FOTO)  44% limited       Sensation   Light Touch Appears Intact      Posture/Postural Control   Posture/Postural Control Postural limitations  Postural Limitations Flexed trunk;Decreased lumbar lordosis      ROM / Strength   AROM / PROM / Strength AROM;Strength      AROM   AROM Assessment Site Lumbar    Lumbar Flexion 25% limited     Lumbar Extension 50% limited     Lumbar - Right Side Bend 10% limited     Lumbar - Left Side Bend 10% limited     Lumbar - Right Rotation 75% limited     Lumbar - Left Rotation 50% limited       Strength   Strength Assessment Site Hip;Knee;Ankle    Right/Left Hip Right;Left    Right Hip Flexion 5/5    Right Hip Extension 3+/5    Right Hip ABduction 4+/5    Left Hip Flexion 5/5    Left Hip Extension 3-/5    Left Hip ABduction 4+/5    Right/Left Knee Right;Left    Right Knee Extension 5/5    Left Knee Extension 5/5    Right/Left Ankle Right;Left    Right Ankle Dorsiflexion 5/5    Left Ankle Dorsiflexion 5/5      Palpation   Palpation comment Mod TTP about LT SI joint sulcus      Special Tests    Special Tests --   (-) SLR bilat; (-)  sacral compression      Transfers   Five time sit to stand comments  13.95 sec with no UE       Ambulation/Gait   Ambulation/Gait Yes    Ambulation/Gait Assistance 6: Modified independent (Device/Increase time)    Ambulation Distance (Feet) 430 Feet    Assistive device None    Gait Pattern Decreased arm swing - left;Trendelenburg    Ambulation Surface Level;Indoor    Gait Comments 2MWT                       Objective measurements completed on examination: See above findings.               PT Education - 06/06/20 1049    Education Details on evaluation findings, and POC    Person(s) Educated Patient;Child(ren)    Methods Explanation    Comprehension Verbalized understanding            PT Short Term Goals - 06/06/20 1155      PT SHORT TERM GOAL #1   Title Patient will be independent with initial HEP and self-management strategies to improve functional outcomes    Time 2    Period Weeks    Status New    Target Date 06/22/20             PT Long Term Goals - 06/06/20 1155      PT LONG TERM GOAL #1   Title Patient will improve FOTO score by 10% to indicate improvement in functional outcomes    Time 4    Period Weeks    Status New    Target Date 07/06/20      PT LONG TERM GOAL #2   Title Patient will report at least 70% overall improvement in subjective complaint to indicate improvement in ability to perform ADLs.    Time 4    Period Weeks    Status New    Target Date 07/06/20      PT LONG TERM GOAL #3   Title Patient will have equal to or > 4/5 MMT throughout BLE to improve ability to perform functional mobility,  stair ambulation and ADLs.    Time 4    Period Weeks    Status New    Target Date 07/06/20                  Plan - 06/06/20 1151    Clinical Impression Statement Patient is a 86 y.o. male who presents to physical therapy with complaint of LBP. Patient demonstrates decreased strength, ROM restriction, increased  tenderness to palpation and gait abnormalities which are likely contributing to symptoms of pain and are negatively impacting patient ability to perform ADLs and functional mobility tasks. Patient will benefit from skilled physical therapy services to address these deficits to reduce pain, improve level of function with ADLs, functional mobility tasks, and reduce risk for falls.    Personal Factors and Comorbidities Age    Examination-Activity Limitations Locomotion Level;Transfers;Stairs;Stand    Examination-Participation Restrictions Community Activity;Laundry;Yard Work    Stability/Clinical Decision Making Stable/Uncomplicated    Clinical Decision Making Low    Rehab Potential Good    PT Frequency 2x / week    PT Duration 8 weeks    PT Treatment/Interventions Aquatic Therapy;Biofeedback;Cryotherapy;Ultrasound;Parrafin;Fluidtherapy;Therapeutic activities;Patient/family education;Neuromuscular re-education;Orthotic Fit/Training;Compression bandaging;Therapeutic exercise;Balance training;Contrast Bath;Electrical Stimulation;DME Instruction;ADLs/Self Care Home Management;Iontophoresis 4mg /ml Dexamethasone;Gait training;Stair training;Moist Heat;Traction;Functional mobility training;Manual techniques;Manual lymph drainage;Energy conservation;Splinting;Spinal Manipulations;Passive range of motion;Scar mobilization;Taping;Vasopneumatic Device;Joint Manipulations;Dry needling    PT Next Visit Plan Review goals and HEP. Progress hip and core strength and lumbar mobility as tolerated. Add bridge, SLR, LTR next visit. Progress to standing when ready    PT Home Exercise Plan 06/06/20: ab set    Consulted and Agree with Plan of Care Patient;Family member/caregiver    Family Member Consulted Daughter           Patient will benefit from skilled therapeutic intervention in order to improve the following deficits and impairments:  Pain, Improper body mechanics, Increased fascial restricitons, Abnormal gait,  Decreased activity tolerance, Impaired flexibility, Decreased strength, Decreased range of motion  Visit Diagnosis: Chronic low back pain, unspecified back pain laterality, unspecified whether sciatica present  Abnormal posture     Problem List Patient Active Problem List   Diagnosis Date Noted  . DVT, recurrent, lower extremity, chronic, bilateral   . Hypomagnesemia   . Palliative care by specialist   . DNR (do not resuscitate) discussion   . Acute hypoxemic respiratory failure ([place])   . Acute on chronic respiratory failure with hypoxia ([place])   . Vascular dementia without behavioral disturbance ([place])   . FTT (failure to thrive) in adult   . Acute pulmonary embolism without acute cor pulmonale ([place]) 04/26/2019  . Essential hypertension 04/26/2019  . AKI (acute kidney injury) ([place]) 04/26/2019  . Chronic midline low back pain without sciatica 10/12/2017  . Diabetes mellitus type 2, noninsulin dependent ([place]) 06/28/2013  . Other and unspecified hyperlipidemia 06/28/2013    12:02 PM, 06/06/20 Josue Hector PT DPT  Physical Therapist with [facility]  [phone] [place] [address] [place], [name], [address] Phone: [phone]   Fax:  [phone]  Name: Ryan Gilmore MRN: [id number] Date of Birth: May 31, 1933

## 2020-06-06 NOTE — Patient Instructions (Signed)
Access Code: [email or web address] URL: [email or web address] Date: 06/06/2020 Prepared by: Josue Hector  Exercises Supine Transversus Abdominis Bracing - Hands on Stomach - 2 x daily - 7 x weekly - 2 sets - 10 reps - 5 sec hold

## 2020-06-08 ENCOUNTER — Other Ambulatory Visit: Payer: Self-pay

## 2020-06-08 ENCOUNTER — Ambulatory Visit (HOSPITAL_COMMUNITY): Payer: Medicare HMO

## 2020-06-08 DIAGNOSIS — R293 Abnormal posture: Secondary | ICD-10-CM | POA: Diagnosis not present

## 2020-06-08 DIAGNOSIS — M545 Low back pain, unspecified: Secondary | ICD-10-CM

## 2020-06-08 DIAGNOSIS — G8929 Other chronic pain: Secondary | ICD-10-CM | POA: Diagnosis not present

## 2020-06-08 NOTE — Patient Instructions (Signed)
Isometric Abdominal    Lying on back with knees bent, tighten stomach by pressing elbows down. Hold _5___ seconds. Repeat _10___ times per set. Do _1___ sets per session. Do _1___ sessions per day.  http://orth.exer.us/1086   Copyright  VHI. All rights reserved.    Sit-to-Stand Exercise  The sit-to-stand exercise (also known as the chair stand or chair rise exercise) strengthens your lower body and helps you maintain or improve your mobility and independence. The goal is to do the sit-to-stand exercise without using your hands. This will be easier as you become stronger. You should always talk with your health care provider before starting any exercise program, especially if you have had recent surgery. Do the exercise exactly as told by your health care provider and adjust it as directed. It is normal to feel mild stretching, pulling, tightness, or discomfort as you do this exercise, but you should stop right away if you feel sudden pain or your pain gets worse. Do not begin doing this exercise until told by your health care provider. What the sit-to-stand exercise does The sit-to-stand exercise helps to strengthen the muscles in your thighs and the muscles in the center of your body that give you stability (core muscles). This exercise is especially helpful if:  You have had knee or hip surgery.  You have trouble getting up from a chair, out of a car, or off the toilet. How to do the sit-to-stand exercise 1. Sit toward the front edge of a sturdy chair without armrests. Your knees should be bent and your feet should be flat on the floor and shoulder-width apart. 2. Place your hands lightly on each side of the seat. Keep your back and neck as straight as possible, with your chest slightly forward. 3. Breathe in slowly. Lean forward and slightly shift your weight to the front of your feet. 4. Breathe out as you slowly stand up. Use your hands as little as possible. 5. Stand and pause for a  full breath in and out. 6. Breathe in as you sit down slowly. Tighten your core and abdominal muscles to control your lowering as much as possible. 7. Breathe out slowly. 8. Do this exercise 10-15 times. If needed, do it fewer times until you build up strength. 9. Rest for 1 minute, then do another set of 10-15 repetitions. To change the difficulty of the sit-to-stand exercise  If the exercise is too difficult, use a chair with sturdy armrests, and push off the armrests to help you come to the standing position. You can also use the armrests to help slowly lower yourself back to sitting. As this gets easier, try to use your arms less. You can also place a firm cushion or pillow on the chair to make the surface higher.  If this exercise is too easy, do not use your arms to help raise or lower yourself. You can also wear a weighted vest, use hand weights, increase your repetitions, or try a lower chair. General tips  You may feel tired when starting an exercise routine. This is normal.  You may have muscle soreness that lasts a few days. This is normal. As you get stronger, you may not feel muscle soreness.  Use smooth, steady movements.  Do not  hold your breath during strength exercises. This can cause unsafe changes in your blood pressure.  Breathe in slowly through your nose, and breathe out slowly through your mouth. Summary  Strengthening your lower body is an important step to  help you move safely and independently.  The sit-to-stand exercise helps strengthen the muscles in your thighs and core.  You should always talk with your health care provider before starting any exercise program, especially if you have had recent surgery. This information is not intended to replace advice given to you by your health care provider. Make sure you discuss any questions you have with your health care provider. Document Revised: 07/21/2018 Document Reviewed: 11/13/2016 Elsevier Patient Education   [place] back, legs bent. Inhale, pressing hips up. Keeping ribs in, lengthen lower back. Exhale, rolling down along spine from top. Repeat 10____ times. 5 second hold. Do _1___ sessions per day.  http://pm.exer.us/54   Copyright  VHI. All rights reserved.   Straight Leg Raise    Tighten stomach, straighten leg as much as you can first and then slowly raise locked right leg 9-12_ inches from floor. Repeat _10___ times per set. Do __1__ sets per session. Do _1___ sessions per day.  http://orth.exer.us/1102   Copyright  VHI. All rights reserved.

## 2020-06-08 NOTE — Therapy (Signed)
[place], [name], [address] Phone: [phone]   Fax:  [phone]  Physical Therapy Treatment  Patient Details  Name: Ryan Gilmore MRN: [id number] Date of Birth: [birth year] Referring Provider (PT): Jenna Luo MD    Encounter Date: 06/08/2020   PT End of Session - 06/08/20 1217    Visit Number 2    Number of Visits 8    Date for PT Re-Evaluation 07/06/20    Authorization Type [employer] HMO    Authorization Time Period submitted for 8 visits check auth    Progress Note Due on Visit 8    PT Start Time 1018    PT Stop Time 1058    PT Time Calculation (min) 40 min    Activity Tolerance Patient tolerated treatment well    Behavior During Therapy [facility] for tasks assessed/performed           Past Medical History:  Diagnosis Date  . Bleeding duodenal ulcer    ?  1980s  . Dementia ([place])   . Diabetes mellitus without complication ([place])   . Hyperlipidemia     Past Surgical History:  Procedure Laterality Date  . APPENDECTOMY      There were no vitals filed for this visit.   Subjective Assessment - 06/08/20 1017    Subjective PT asked wife to be present during session. Patient reported he did not have any LBP today. Wife reports she is fearful he will fall and tries to be near him when he stands up to walk.    Patient is accompained by: Family member   Daughter   Limitations Lifting;Standing;Walking;House hold activities    Patient Stated Goals help with back pain    Currently in Pain? No/denies    Pain Onset More than a month ago            [facility] Adult PT Treatment/Exercise - 06/08/20 0001      Transfers   Transfers Supine to Sit;Sit to Supine    Supine to Sit 4: Min guard;4: Min assist    Supine to Sit Details (indicate cue type and reason) verbal/tactile cues    Supine to Sit Details Tactile cues for sequencing;Tactile cues for placement;Verbal cues for sequencing;Verbal cues for  technique    Sit to Supine 4: Min assist;4: Min guard    Sit to Supine Details (indicate cue type and reason) Tactile cues for sequencing;Verbal cues for sequencing;Verbal cues for technique;Tactile cues for placement      Exercises   Exercises Lumbar      Lumbar Exercises: Stretches   Lower Trunk Rotation 3 reps;10 seconds      Lumbar Exercises: Standing   Other Standing Lumbar Exercises modified tandem 30 sec x2 each    Other Standing Lumbar Exercises heel/toe gait on blue line 2 RT w/ gait belt      Lumbar Exercises: Seated   Sit to Stand 10 reps    Sit to Stand Limitations 2 sets      Lumbar Exercises: Supine   Ab Set 10 reps;5 seconds    Bridge 10 reps;Non-compliant;3 seconds    Straight Leg Raise 10 reps;2 seconds             PT Education - 06/08/20 1217    Education Details Reviewed evaluation and goals. Discussed purpose and technique of interventions throughout session. Advanced HEP.    Person(s) Educated Patient;Spouse    Methods Explanation;Demonstration;Handout    Comprehension Verbalized understanding;Need  further instruction            PT Short Term Goals - 06/06/20 1155      PT SHORT TERM GOAL #1   Title Patient will be independent with initial HEP and self-management strategies to improve functional outcomes    Time 2    Period Weeks    Status New    Target Date 06/22/20             PT Long Term Goals - 06/06/20 1155      PT LONG TERM GOAL #1   Title Patient will improve FOTO score by 10% to indicate improvement in functional outcomes    Time 4    Period Weeks    Status New    Target Date 07/06/20      PT LONG TERM GOAL #2   Title Patient will report at least 70% overall improvement in subjective complaint to indicate improvement in ability to perform ADLs.    Time 4    Period Weeks    Status New    Target Date 07/06/20      PT LONG TERM GOAL #3   Title Patient will have equal to or > 4/5 MMT throughout BLE to improve ability to  perform functional mobility, stair ambulation and ADLs.    Time 4    Period Weeks    Status New    Target Date 07/06/20             Plan - 06/08/20 1218    Personal Factors and Comorbidities Age    Examination-Activity Limitations Locomotion Level;Transfers;Stairs;Stand    Examination-Participation Restrictions Community Activity;Laundry;Yard Work    Stability/Clinical Decision Making Stable/Uncomplicated    Rehab Potential Good    PT Frequency 2x / week    PT Duration 8 weeks    PT Treatment/Interventions Aquatic Therapy;Biofeedback;Cryotherapy;Ultrasound;Parrafin;Fluidtherapy;Therapeutic activities;Patient/family education;Neuromuscular re-education;Orthotic Fit/Training;Compression bandaging;Therapeutic exercise;Balance training;Contrast Bath;Electrical Stimulation;DME Instruction;ADLs/Self Care Home Management;Iontophoresis 4mg /ml Dexamethasone;Gait training;Stair training;Moist Heat;Traction;Functional mobility training;Manual techniques;Manual lymph drainage;Energy conservation;Splinting;Spinal Manipulations;Passive range of motion;Scar mobilization;Taping;Vasopneumatic Device;Joint Manipulations;Dry needling    PT Next Visit Plan Advance HEP - LTR, log roll. Hip and core strengthening exercises, balance training. Include wife for carry over of HEP.    PT Home Exercise Plan 06/06/20: ab set; 06/08/20 - ab set printout, supine bridge, supine SLR, STS    Consulted and Agree with Plan of Care Patient;Family member/caregiver    Family Member Consulted Wife           Patient will benefit from skilled therapeutic intervention in order to improve the following deficits and impairments:  Pain, Improper body mechanics, Increased fascial restricitons, Abnormal gait, Decreased activity tolerance, Impaired flexibility, Decreased strength, Decreased range of motion  Visit Diagnosis: Chronic low back pain, unspecified back pain laterality, unspecified whether sciatica present  Abnormal  posture     Problem List Patient Active Problem List   Diagnosis Date Noted  . DVT, recurrent, lower extremity, chronic, bilateral   . Hypomagnesemia   . Palliative care by specialist   . DNR (do not resuscitate) discussion   . Acute hypoxemic respiratory failure ([place])   . Acute on chronic respiratory failure with hypoxia ([place])   . Vascular dementia without behavioral disturbance ([place])   . FTT (failure to thrive) in adult   . Acute pulmonary embolism without acute cor pulmonale ([place]) 04/26/2019  . Essential hypertension 04/26/2019  . AKI (acute kidney injury) ([place]) 04/26/2019  . Chronic midline low back pain without sciatica 10/12/2017  . Diabetes mellitus  type 2, noninsulin dependent ([place]) 06/28/2013  . Other and unspecified hyperlipidemia 06/28/2013    Floria Raveling. Hartnett-Rands, MS, PT [facility] #[id number] 06/08/2020, 12:22 PM  [facility] [address] [place], [name], [address] Phone: [phone]   Fax:  [phone]  Name: HASTEN SWEITZER MRN: [id number] Date of Birth: 1932-10-21

## 2020-06-13 ENCOUNTER — Ambulatory Visit (HOSPITAL_COMMUNITY): Payer: Medicare HMO | Admitting: Physical Therapy

## 2020-06-13 ENCOUNTER — Other Ambulatory Visit: Payer: Self-pay

## 2020-06-13 DIAGNOSIS — G8929 Other chronic pain: Secondary | ICD-10-CM | POA: Diagnosis not present

## 2020-06-13 DIAGNOSIS — R293 Abnormal posture: Secondary | ICD-10-CM

## 2020-06-13 DIAGNOSIS — M545 Low back pain, unspecified: Secondary | ICD-10-CM

## 2020-06-13 NOTE — Therapy (Signed)
[place], [name], [address] Phone: [phone]   Fax:  [phone]  Physical Therapy Treatment  Patient Details  Name: Ryan Gilmore MRN: [id number] Date of Birth: 03/04/1933 Referring Provider (PT): Jenna Luo MD    Encounter Date: 06/13/2020   PT End of Session - 06/13/20 1218    Visit Number 3    Number of Visits 8    Date for PT Re-Evaluation 07/06/20    Authorization Type [employer] H[employer]    Authorization Time Period submitted for 8 visits check auth    Progress Note Due on Visit 8    PT Start Time 1135    PT Stop Time 1215    PT Time Calculation (min) 40 min    Activity Tolerance Patient tolerated treatment well    Behavior During Therapy [facility] for tasks assessed/performed           Past Medical History:  Diagnosis Date  . Bleeding duodenal ulcer    ?  1980s  . Dementia ([place])   . Diabetes mellitus without complication ([place])   . Hyperlipidemia     Past Surgical History:  Procedure Laterality Date  . APPENDECTOMY      There were no vitals filed for this visit.   Subjective Assessment - 06/13/20 1140    Subjective pt reports no pain today.  States it has been a while since he had pain and has not had any episodes of LOB.    Currently in Pain? No/denies                             [facility] Adult PT Treatment/Exercise - 06/13/20 0001      Lumbar Exercises: Standing   Heel Raises 15 reps    Other Standing Lumbar Exercises tandem 30"X2, vectors 10X5" each with 1 HHA    Other Standing Lumbar Exercises tandem, retro, sidestepping 2RT each on blue line      Lumbar Exercises: Seated   Sit to Stand 10 reps    Sit to Stand Limitations 2 sets      Lumbar Exercises: Supine   Ab Set 10 reps;5 seconds    Bridge 10 reps;Non-compliant;3 seconds    Straight Leg Raise 10 reps;2 seconds                    PT Short Term Goals - 06/06/20 1155      PT SHORT TERM GOAL  #1   Title Patient will be independent with initial HEP and self-management strategies to improve functional outcomes    Time 2    Period Weeks    Status New    Target Date 06/22/20             PT Long Term Goals - 06/06/20 1155      PT LONG TERM GOAL #1   Title Patient will improve FOTO score by 10% to indicate improvement in functional outcomes    Time 4    Period Weeks    Status New    Target Date 07/06/20      PT LONG TERM GOAL #2   Title Patient will report at least 70% overall improvement in subjective complaint to indicate improvement in ability to perform ADLs.    Time 4    Period Weeks    Status New    Target Date 07/06/20      PT LONG TERM  GOAL #3   Title Patient will have equal to or > 4/5 MMT throughout BLE to improve ability to perform functional mobility, stair ambulation and ADLs.    Time 4    Period Weeks    Status New    Target Date 07/06/20                 Plan - 06/13/20 1218    Clinical Impression Statement conitnued with focus on improving overall mobiltiy and strength.  Pt able to demonstrate bed mobility with incresaed ease, however cues to complete more efficiently using logroll technique to transfer supine to sit.  Pt with noted challenge with SLR and extension lag due to weakness.  Progressed to standing balance actvities with overall descent performance and no LOB completing line actvities.  Cues with sit to stand to keep shoulders back and not lunging forward to compensate.    Personal Factors and Comorbidities Age    Examination-Activity Limitations Locomotion Level;Transfers;Stairs;Stand    Examination-Participation Restrictions Community Activity;Laundry;Yard Work    Stability/Clinical Decision Making Stable/Uncomplicated    Rehab Potential Good    PT Frequency 2x / week    PT Duration 8 weeks    PT Treatment/Interventions Aquatic Therapy;Biofeedback;Cryotherapy;Ultrasound;Parrafin;Fluidtherapy;Therapeutic activities;Patient/family  education;Neuromuscular re-education;Orthotic Fit/Training;Compression bandaging;Therapeutic exercise;Balance training;Contrast Bath;Electrical Stimulation;DME Instruction;ADLs/Self Care Home Management;Iontophoresis 4mg /ml Dexamethasone;Gait training;Stair training;Moist Heat;Traction;Functional mobility training;Manual techniques;Manual lymph drainage;Energy conservation;Splinting;Spinal Manipulations;Passive range of motion;Scar mobilization;Taping;Vasopneumatic Device;Joint Manipulations;Dry needling    PT Next Visit Plan Advance HEP as ready.  Continue with hip and core strengthening exercises, balance training. Include wife for carry over of HEP.  Progress to balance beam for more challenge    PT Home Exercise Plan 06/06/20: ab set; 06/08/20 - ab set printout, supine bridge, supine SLR, STS    Consulted and Agree with Plan of Care Patient;Family member/caregiver    Family Member Consulted Wife           Patient will benefit from skilled therapeutic intervention in order to improve the following deficits and impairments:  Pain, Improper body mechanics, Increased fascial restricitons, Abnormal gait, Decreased activity tolerance, Impaired flexibility, Decreased strength, Decreased range of motion  Visit Diagnosis: Abnormal posture  Chronic low back pain, unspecified back pain laterality, unspecified whether sciatica present     Problem List Patient Active Problem List   Diagnosis Date Noted  . DVT, recurrent, lower extremity, chronic, bilateral   . Hypomagnesemia   . Palliative care by specialist   . DNR (do not resuscitate) discussion   . Acute hypoxemic respiratory failure ([place])   . Acute on chronic respiratory failure with hypoxia ([place])   . Vascular dementia without behavioral disturbance ([place])   . FTT (failure to thrive) in adult   . Acute pulmonary embolism without acute cor pulmonale ([place]) 04/26/2019  . Essential hypertension 04/26/2019  . AKI (acute kidney injury) ([place])  04/26/2019  . Chronic midline low back pain without sciatica 10/12/2017  . Diabetes mellitus type 2, noninsulin dependent ([place]) 06/28/2013  . Other and unspecified hyperlipidemia 06/28/2013   Teena Irani, PTA/CLT [phone]  Teena Irani 06/13/2020, 12:22 PM  [place], [name], [address] Phone: [phone]   Fax:  [phone]  Name: CLEVE PAOLILLO MRN: [id number] Date of Birth: Nov 17, 1932

## 2020-06-20 ENCOUNTER — Ambulatory Visit (HOSPITAL_COMMUNITY): Payer: Medicare HMO | Admitting: Physical Therapy

## 2020-06-20 ENCOUNTER — Other Ambulatory Visit: Payer: Self-pay

## 2020-06-20 DIAGNOSIS — R293 Abnormal posture: Secondary | ICD-10-CM

## 2020-06-20 DIAGNOSIS — G8929 Other chronic pain: Secondary | ICD-10-CM

## 2020-06-20 DIAGNOSIS — M545 Low back pain: Secondary | ICD-10-CM | POA: Diagnosis not present

## 2020-06-20 NOTE — Patient Instructions (Signed)
EXTENSION: Standing (Active)    Stand, both feet flat. Draw right leg behind body as far as possible.Complete _2__ sets of _10__ repetitions. Perform _2__ sessions per day.    Functional Quadriceps: Sit to Stand    Sit on edge of chair, feet flat on floor. Stand upright, extending knees fully. Repeat _10___ times per set. Do _2___ sets per session. Do __2__ sessions per day.

## 2020-06-20 NOTE — Therapy (Addendum)
[facility] [address] [place], [name], [address] Phone: [phone]   Fax:  [phone]  Physical Therapy Treatment  Patient Details  Name: Ryan Gilmore MRN: [id number] Date of Birth: 09/16/1933 Referring Provider (PT): Jenna Luo MD   PHYSICAL THERAPY DISCHARGE SUMMARY  Visits from [facility] of Care: 4  Current functional level related to goals / functional outcomes: See below    Remaining deficits: See below     Education / Equipment: See assessment  Plan: Patient agrees to discharge.  Patient goals were partially met. Patient is being discharged due to being pleased with the current functional level.  ?????      Encounter Date: 06/20/2020   PT End of Session - 06/20/20 1446    Visit Number 4    Number of Visits 8    Date for PT Re-Evaluation 07/06/20    Authorization Type [employer] HMO    Authorization Time Period submitted for 8 visits check auth    Progress Note Due on Visit 8    PT Start Time 1403    PT Stop Time 1448    PT Time Calculation (min) 45 min    Activity Tolerance Patient tolerated treatment well    Behavior During Therapy WFL for tasks assessed/performed           Past Medical History:  Diagnosis Date  . Bleeding duodenal ulcer    ?  1980s  . Dementia ([place])   . Diabetes mellitus without complication ([place])   . Hyperlipidemia     Past Surgical History:  Procedure Laterality Date  . APPENDECTOMY      There were no vitals filed for this visit.   Subjective Assessment - 06/20/20 1534    Subjective Pt states he is 80% improved.  STates he has no pain or issues today.    Currently in Pain? No/denies              [facility] PT Assessment - 06/20/20 1430      Assessment   Medical Diagnosis LBP     Referring Provider (PT) Jenna Luo MD     Onset Date/Surgical Date --   >2 years    Prior Therapy Yes about 5 years ago       Precautions   Precautions None      [place] residence    Living Arrangements Spouse/significant other      Prior Function   Level of Independence Independent with basic ADLs      Cognition   Overall Cognitive Status Within Functional Limits for tasks assessed      Observation/Other Assessments   Focus on Therapeutic Outcomes (FOTO)   6% limited    was 44% limited on 06/06/20     Sensation   Light Touch --      Posture/Postural Control   Posture/Postural Control Postural limitations    Postural Limitations Flexed trunk;Decreased lumbar lordosis      AROM   Lumbar Flexion 10% limited   was 25% limited   Lumbar Extension 10% limited   was 50% limited   Lumbar - Right Side Bend WFL   was 10% limited   Lumbar - Left Side Bend WFL   was 10% limited   Lumbar - Right Rotation 25% limited    was 75% limited   Lumbar - Left Rotation 25% limited    was 50% limited     Strength   Right  Hip Flexion 5/5   was 5/5   Right Hip Extension 3-/5   was 3+/5   Right Hip ABduction 5/5   was 4+/5   Left Hip Flexion 5/5   was 5/5   Left Hip Extension 3-/5   was 3-/5   Left Hip ABduction 4+/5   was 4+/5   Right Knee Extension 5/5   was 5/5   Left Knee Extension 5/5   was 5/5   Right Ankle Dorsiflexion 5/5   was 5/5   Left Ankle Dorsiflexion 5/5   was 5/5     Palpation   Palpation comment --      Special Tests    Special Tests --      Transfers   Five time sit to stand comments  9.85 sec with no UE    13.95 on 9/1 without UE's     Ambulation/Gait   Ambulation/Gait Yes    Ambulation/Gait Assistance 6: Modified independent (Device/Increase time)    Ambulation Distance (Feet) 430 Feet    Assistive device None    Gait Pattern Decreased arm swing - left;Trendelenburg    Gait Comments 2MWT                          [facility] Adult PT Treatment/Exercise - 06/20/20 1430      Lumbar Exercises: Standing   Heel Raises 15 reps    Other Standing Lumbar Exercises vectors 10X5" each with 1 HHA, hip abd  and extension 10 reps each in standing      Lumbar Exercises: Seated   Sit to Stand 10 reps      Lumbar Exercises: Supine   Bridge 15 reps;Limitations    Bridge Limitations 2 sets    Straight Leg Raise 10 reps;2 seconds    Other Supine Lumbar Exercises bed mobility                    PT Short Term Goals - 06/20/20 1440      PT SHORT TERM GOAL #1   Title Patient will be independent with initial HEP and self-management strategies to improve functional outcomes    Time 2    Period Weeks    Status Achieved    Target Date 06/22/20             PT Long Term Goals - 06/20/20 1440      PT LONG TERM GOAL #1   Title Patient will improve FOTO score by 10% to indicate improvement in functional outcomes    Time 4    Period Weeks    Status Achieved      PT LONG TERM GOAL #2   Title Patient will report at least 70% overall improvement in subjective complaint to indicate improvement in ability to perform ADLs.    Time 4    Period Weeks    Status Achieved      PT LONG TERM GOAL #3   Title Patient will have equal to or > 4/5 MMT throughout BLE to improve ability to perform functional mobility, stair ambulation and ADLs.    Time 4    Period Weeks    Status Not Met                 Plan - 06/20/20 1531    Clinical Impression Statement Began session and completed 24 minutes of therex before being notified by CG that patient would like to be discharged.  MMT reveals   no significant changes, especially in hip extensors.  Lumbar ROM, however has improved as well as STS power and stability overall.  Pt with improved FOTO score from 44% limited to 6% limited has met his STG and 1/3 LTG's.  discussed progress with patient and CG and recommended he continue hip extensions, sit to stands, a walking program and already established HEP.    Personal Factors and Comorbidities Age    Examination-Activity Limitations Locomotion Level;Transfers;Stairs;Stand    Examination-Participation  Restrictions Community Activity;Laundry;Yard Work    Stability/Clinical Decision Making Stable/Uncomplicated    Rehab Potential Good    PT Frequency 2x / week    PT Duration 8 weeks    PT Treatment/Interventions Aquatic Therapy;Biofeedback;Cryotherapy;Ultrasound;Parrafin;Fluidtherapy;Therapeutic activities;Patient/family education;Neuromuscular re-education;Orthotic Fit/Training;Compression bandaging;Therapeutic exercise;Balance training;Contrast Bath;Electrical Stimulation;DME Instruction;ADLs/Self Care Home Management;Iontophoresis 44m/ml Dexamethasone;Gait training;Stair training;Moist Heat;Traction;Functional mobility training;Manual techniques;Manual lymph drainage;Energy conservation;Splinting;Spinal Manipulations;Passive range of motion;Scar mobilization;Taping;Vasopneumatic Device;Joint Manipulations;Dry needling    PT Next Visit Plan Discharge per pateint request.    PT Home Exercise Plan 06/06/20: ab set; 06/08/20 - ab set printout, supine bridge, supine SLR, STS  9/15: standing hip extension    Consulted and Agree with Plan of Care Patient;Family member/caregiver    Family Member Consulted Wife           Patient will benefit from skilled therapeutic intervention in order to improve the following deficits and impairments:  Pain, Improper body mechanics, Increased fascial restricitons, Abnormal gait, Decreased activity tolerance, Impaired flexibility, Decreased strength, Decreased range of motion  Visit Diagnosis: Abnormal posture  Chronic low back pain, unspecified back pain laterality, unspecified whether sciatica present     Problem List Patient Active Problem List   Diagnosis Date Noted  . DVT, recurrent, lower extremity, chronic, bilateral   . Hypomagnesemia   . Palliative care by specialist   . DNR (do not resuscitate) discussion   . Acute hypoxemic respiratory failure ([place]   . Acute on chronic respiratory failure with hypoxia ([place]   . Vascular dementia without  behavioral disturbance ([place]   . FTT (failure to thrive) in adult   . Acute pulmonary embolism without acute cor pulmonale ([place] 04/26/2019  . Essential hypertension 04/26/2019  . AKI (acute kidney injury) ([place] 04/26/2019  . Chronic midline low back pain without sciatica 10/12/2017  . Diabetes mellitus type 2, noninsulin dependent ([place] 06/28/2013  . Other and unspecified hyperlipidemia 06/28/2013   ATeena Irani PTA/CLT [phone] FTeena Irani9/15/2021, 3:34 PM  4:20 PM, 06/20/20 CJosue HectorPT DPT  [facility] [phone] [address]Phone: [phone]  Fax:  [phone] Name: Ryan GIRARDINMRN: [id number] of Birth: 902-Dec-1934

## 2020-06-21 ENCOUNTER — Encounter (HOSPITAL_COMMUNITY): Payer: Medicare HMO | Admitting: Physical Therapy

## 2020-06-26 ENCOUNTER — Encounter (HOSPITAL_COMMUNITY): Payer: Medicare HMO | Admitting: Physical Therapy

## 2020-06-27 ENCOUNTER — Encounter (HOSPITAL_COMMUNITY): Payer: Medicare HMO | Admitting: Physical Therapy

## 2020-06-28 ENCOUNTER — Encounter (HOSPITAL_COMMUNITY): Payer: Medicare HMO

## 2020-07-02 ENCOUNTER — Encounter (HOSPITAL_COMMUNITY): Payer: Medicare HMO | Admitting: Physical Therapy

## 2020-07-04 ENCOUNTER — Encounter (HOSPITAL_COMMUNITY): Payer: Medicare HMO | Admitting: Physical Therapy

## 2020-07-16 ENCOUNTER — Ambulatory Visit: Payer: Medicare HMO | Attending: Internal Medicine

## 2020-07-16 DIAGNOSIS — Z23 Encounter for immunization: Secondary | ICD-10-CM

## 2020-07-16 NOTE — Progress Notes (Signed)
   Covid-19 Vaccination Clinic  Name:  AUREL NGUYEN    MRN: [id number] DOB: 01-May-1933  07/16/2020  Mr. Gallo was observed post Covid-19 immunization for 15 minutes without incident. He was provided with Vaccine Information Sheet and instruction to access the V-Safe system.   Mr. Radell was instructed to call 911 with any severe reactions post vaccine: Marland Kitchen Difficulty breathing  . Swelling of face and throat  . A fast heartbeat  . A bad rash all over body  . Dizziness and weakness

## 2020-07-23 ENCOUNTER — Other Ambulatory Visit: Payer: Self-pay | Admitting: Family Medicine

## 2020-08-16 DIAGNOSIS — R972 Elevated prostate specific antigen [PSA]: Secondary | ICD-10-CM | POA: Diagnosis not present

## 2020-08-20 ENCOUNTER — Ambulatory Visit: Payer: Medicare HMO

## 2020-08-20 ENCOUNTER — Other Ambulatory Visit: Payer: Self-pay

## 2020-08-23 DIAGNOSIS — N400 Enlarged prostate without lower urinary tract symptoms: Secondary | ICD-10-CM | POA: Diagnosis not present

## 2020-08-23 DIAGNOSIS — R972 Elevated prostate specific antigen [PSA]: Secondary | ICD-10-CM | POA: Diagnosis not present

## 2020-12-04 ENCOUNTER — Ambulatory Visit (INDEPENDENT_AMBULATORY_CARE_PROVIDER_SITE_OTHER): Payer: Medicare HMO | Admitting: Podiatry

## 2020-12-04 ENCOUNTER — Encounter: Payer: Self-pay | Admitting: Podiatry

## 2020-12-04 ENCOUNTER — Other Ambulatory Visit: Payer: Self-pay

## 2020-12-04 DIAGNOSIS — L853 Xerosis cutis: Secondary | ICD-10-CM

## 2020-12-04 DIAGNOSIS — M79675 Pain in left toe(s): Secondary | ICD-10-CM | POA: Diagnosis not present

## 2020-12-04 DIAGNOSIS — M2041 Other hammer toe(s) (acquired), right foot: Secondary | ICD-10-CM

## 2020-12-04 DIAGNOSIS — E119 Type 2 diabetes mellitus without complications: Secondary | ICD-10-CM | POA: Diagnosis not present

## 2020-12-04 DIAGNOSIS — M79674 Pain in right toe(s): Secondary | ICD-10-CM

## 2020-12-04 DIAGNOSIS — M2042 Other hammer toe(s) (acquired), left foot: Secondary | ICD-10-CM | POA: Diagnosis not present

## 2020-12-04 DIAGNOSIS — B351 Tinea unguium: Secondary | ICD-10-CM | POA: Diagnosis not present

## 2020-12-04 NOTE — Patient Instructions (Signed)
Diabetes Mellitus and Foot Care Foot care is an important part of your health, especially when you have diabetes. Diabetes may cause you to have problems because of poor blood flow (circulation) to your feet and legs, which can cause your skin to:  Become thinner and drier.  Break more easily.  Heal more slowly.  Peel and crack. You may also have nerve damage (neuropathy) in your legs and feet, causing decreased feeling in them. This means that you may not notice minor injuries to your feet that could lead to more serious problems. Noticing and addressing any potential problems early is the best way to prevent future foot problems. How to care for your feet Foot hygiene  Wash your feet daily with warm water and mild soap. Do not use hot water. Then, pat your feet and the areas between your toes until they are completely dry. Do not soak your feet as this can dry your skin.  Trim your toenails straight across. Do not dig under them or around the cuticle. File the edges of your nails with an emery board or nail file.  Apply a moisturizing lotion or petroleum jelly to the skin on your feet and to dry, brittle toenails. Use lotion that does not contain alcohol and is unscented. Do not apply lotion between your toes.   Shoes and socks  Wear clean socks or stockings every day. Make sure they are not too tight. Do not wear knee-high stockings since they may decrease blood flow to your legs.  Wear shoes that fit properly and have enough cushioning. Always look in your shoes before you put them on to be sure there are no objects inside.  To break in new shoes, wear them for just a few hours a day. This prevents injuries on your feet. Wounds, scrapes, corns, and calluses  Check your feet daily for blisters, cuts, bruises, sores, and redness. If you cannot see the bottom of your feet, use a mirror or ask someone for help.  Do not cut corns or calluses or try to remove them with medicine.  If you  find a minor scrape, cut, or break in the skin on your feet, keep it and the skin around it clean and dry. You may clean these areas with mild soap and water. Do not clean the area with peroxide, alcohol, or iodine.  If you have a wound, scrape, corn, or callus on your foot, look at it several times a day to make sure it is healing and not infected. Check for: ? Redness, swelling, or pain. ? Fluid or blood. ? Warmth. ? Pus or a bad smell.   General tips  Do not cross your legs. This may decrease blood flow to your feet.  Do not use heating pads or hot water bottles on your feet. They may burn your skin. If you have lost feeling in your feet or legs, you may not know this is happening until it is too late.  Protect your feet from hot and cold by wearing shoes, such as at the beach or on hot pavement.  Schedule a complete foot exam at least once a year (annually) or more often if you have foot problems. Report any cuts, sores, or bruises to your health care provider immediately. Where to find more information  American Diabetes Association: www.diabetes.org  Association of Diabetes Care & Education Specialists: www.diabeteseducator.org Contact a health care provider if:  You have a medical condition that increases your risk of infection and   you have any cuts, sores, or bruises on your feet.  You have an injury that is not healing.  You have redness on your legs or feet.  You feel burning or tingling in your legs or feet.  You have pain or cramps in your legs and feet.  Your legs or feet are numb.  Your feet always feel cold.  You have pain around any toenails. Get help right away if:  You have a wound, scrape, corn, or callus on your foot and: ? You have pain, swelling, or redness that gets worse. ? You have fluid or blood coming from the wound, scrape, corn, or callus. ? Your wound, scrape, corn, or callus feels warm to the touch. ? You have pus or a bad smell coming from  the wound, scrape, corn, or callus. ? You have a fever. ? You have a red line going up your leg. Summary  Check your feet every day for blisters, cuts, bruises, sores, and redness.  Apply a moisturizing lotion or petroleum jelly to the skin on your feet and to dry, brittle toenails.  Wear shoes that fit properly and have enough cushioning.  If you have foot problems, report any cuts, sores, or bruises to your health care provider immediately.  Schedule a complete foot exam at least once a year (annually) or more often if you have foot problems. This information is not intended to replace advice given to you by your health care provider. Make sure you discuss any questions you have with your health care provider. Document Revised: 04/12/2020 Document Reviewed: 04/12/2020 Elsevier Patient Education  2021 [employer]  

## 2020-12-09 NOTE — Progress Notes (Signed)
ANNUAL DIABETIC FOOT EXAM  Subjective: Ryan Gilmore presents today for for diabetic foot evaluation and painful thick toenails that are difficult to trim. Pain interferes with ambulation. Aggravating factors include wearing enclosed shoe gear. Pain is relieved with periodic professional debridement.   His wife and daughter are present during today's visit. Wife states she did attempt to cut his toenails, but he was not cooperative and she did not feel comfortable doing so due to him being diabetic.  Patient relates 20 year h/o diabetes.  Patient denies any h/o foot wounds.  Patient denies symptoms of foot numbness.  Patient denies symptoms of foot tingling.  Patient denies symptoms of burning in feet.  Patient denies symptoms of pins/needles in feet.  Patient did not check blood glucose this morning.  Ryan Frizzle, MD is patient's PCP. Last visit was 05/24/2020.Marland Kitchen  Past Medical History:  Diagnosis Date  . Bleeding duodenal ulcer    ?  1980s  . Dementia ([place])   . Diabetes mellitus without complication ([place])   . Hyperlipidemia    Patient Active Problem List   Diagnosis Date Noted  . DVT, recurrent, lower extremity, chronic, bilateral   . Hypomagnesemia   . Palliative care by specialist   . DNR (do not resuscitate) discussion   . Acute hypoxemic respiratory failure ([place])   . Acute on chronic respiratory failure with hypoxia ([place])   . Vascular dementia without behavioral disturbance ([place])   . FTT (failure to thrive) in adult   . Acute pulmonary embolism without acute cor pulmonale ([place]) 04/26/2019  . Essential hypertension 04/26/2019  . AKI (acute kidney injury) ([place]) 04/26/2019  . Chronic midline low back pain without sciatica 10/12/2017  . Diabetes mellitus type 2, noninsulin dependent ([place]) 06/28/2013  . Other and unspecified hyperlipidemia 06/28/2013   Past Surgical History:  Procedure Laterality Date  . APPENDECTOMY     Current Outpatient Medications on File  Prior to Visit  Medication Sig Dispense Refill  . Blood Glucose Monitoring Suppl (BLOOD GLUCOSE SYSTEM PAK) KIT Please dispense based on patient and insurance preference. Use as directed to monitor FSBS 2x. Dx: E11.E11.65. 1 kit 1  . donepezil (ARICEPT) 5 MG tablet TAKE 1 TABLET BY MOUTH AT BEDTIME (Patient taking differently: Take 5 mg by mouth at bedtime. ) 90 tablet 3  . glipiZIDE (GLUCOTROL XL) 10 MG 24 hr tablet Take 1 tablet by mouth once daily with breakfast (Patient taking differently: Take 10 mg by mouth in the morning, at noon, and at bedtime. ) 90 tablet 0  . Glucose Blood (BLOOD GLUCOSE TEST STRIPS) STRP Please dispense one touch verio flex lancets, and testing strips. Use as directed to monitor FSBS 2x. Dx: W65.K81.27. 100 strip 11  . Lancets MISC Please dispense based on patient and insurance preference. Use as directed to monitor FSBS 2x. Dx: E11.E11.65. 100 each 3  . metFORMIN (GLUCOPHAGE) 500 MG tablet TAKE 2 TABLETS BY MOUTH TWICE DAILY WITH A MEAL (Patient taking differently: Take 1,000 mg by mouth 2 (two) times daily with a meal. ) 360 tablet 1  . Multiple Vitamin (MULTIVITAMIN) tablet Take 1 tablet by mouth daily.    . pioglitazone (ACTOS) 30 MG tablet Take 1 tablet (30 mg total) by mouth daily. (Patient not taking: Reported on 05/18/2020) 90 tablet 1  . pravastatin (PRAVACHOL) 40 MG tablet Take 1 tablet by mouth once daily 90 tablet 0  . rosuvastatin (CRESTOR) 20 MG tablet Take 20 mg by mouth daily.    Marland Kitchen  sitaGLIPtin (JANUVIA) 100 MG tablet Take 1 tablet (100 mg total) by mouth daily. 90 tablet 3   Current Facility-Administered Medications on File Prior to Visit  Medication Dose Route Frequency Provider Last Rate Last Admin  . cyanocobalamin ((VITAMIN B-12)) injection 1,000 mcg  1,000 mcg Intramuscular Q30 days Ryan Frizzle, MD   1,000 mcg at 05/24/20 3888    No Known Allergies Social History   Occupational History  . Not on file  Tobacco Use  . Smoking status: Never  Smoker  . Smokeless tobacco: Current User    Types: Chew  Substance and Sexual Activity  . Alcohol use: Not Currently  . Drug use: Never  . Sexual activity: Not on file   Family History  Problem Relation Age of Onset  . Heart disease Father   . Diabetes Brother    Immunization History  Administered Date(s) Administered  . Fluad Quad(high Dose 65+) 07/07/2019  . Influenza, High Dose Seasonal PF 09/03/2015  . Influenza,inj,Quad PF,6+ Mos 06/28/2013, 06/18/2017  . PFIZER(Purple Top)SARS-COV-2 Vaccination 10/27/2019, 11/17/2019, 07/16/2020  . Pneumococcal Conjugate-13 12/12/2016  . Pneumococcal-Unspecified 07/05/2015     Review of Systems: Negative except as noted in the HPI.  Objective: There were no vitals filed for this visit.  Ryan Gilmore is a pleasan 87 y.o. African American male in NAD. AAO X 3.  Vascular Examination: Capillary fill time to digits <3 seconds b/l lower extremities. Faintly palpable DP pulse(s) b/l lower extremities. Nonpalpable PT pulse(s) b/l lower extremities. Pedal hair sparse. Lower extremity skin temperature gradient within normal limits. No pain with calf compression b/l.  Dermatological Examination: Pedal skin with normal turgor, texture and tone bilaterally. No open wounds bilaterally. No interdigital macerations bilaterally. Toenails 1-5 b/l elongated, discolored, dystrophic, thickened, crumbly with subungual debris and tenderness to dorsal palpation. Pedal skin noted to be dry and flaky b/l lower extremities.  Musculoskeletal Examination: Normal muscle strength 5/5 to all lower extremity muscle groups bilaterally. No pain crepitus or joint limitation noted with ROM b/l. Hammertoes noted to the 2-5 bilaterally.  Footwear Assessment: Does the patient wear appropriate shoes? No.. Does the patient need inserts/orthotics? No..  Neurological Examination: Protective sensation intact 5/5 intact bilaterally with 10g monofilament b/l. Vibratory  sensation intact b/l.  Assessment: 1. Pain due to onychomycosis of toenails of both feet   2. Acquired hammertoes of both feet   3. Xerosis cutis   4. Diabetes mellitus type 2, noninsulin dependent ([place])   5. Encounter for diabetic foot exam ([place])     ADA Risk Categorization: High Risk  Patient has one or more of the following: Loss of protective sensation Absent pedal pulses Severe Foot deformity History of foot ulcer  Plan: -Examined patient. -Diabetic foot examination performed on today's visit. Discussed diabetic foot care principles. Dispensed literature on today's visit. -Patient to continue soft, supportive shoe gear daily. -Toenails 1-5 b/l were debrided in length and girth with sterile nail nippers and dremel without iatrogenic bleeding. Discussed options for treatment of onychomycosis.. May apply Vick's Vapor Rub to toenails once daily. -For dry skin, may use CeraVe Lotion, which they already have at home. Patient/POA instructed to apply to foot/feet once daily avoiding application between toes.  -Patient to report any pedal injuries to medical professional immediately. -Patient/POA to call should there be question/concern in the interim.  Return in about 3 months (around 03/06/2021).  Marzetta Board, DPM

## 2021-02-13 DIAGNOSIS — R972 Elevated prostate specific antigen [PSA]: Secondary | ICD-10-CM | POA: Diagnosis not present

## 2021-02-20 DIAGNOSIS — N400 Enlarged prostate without lower urinary tract symptoms: Secondary | ICD-10-CM | POA: Diagnosis not present

## 2021-02-20 DIAGNOSIS — R8271 Bacteriuria: Secondary | ICD-10-CM | POA: Diagnosis not present

## 2021-02-20 DIAGNOSIS — R972 Elevated prostate specific antigen [PSA]: Secondary | ICD-10-CM | POA: Diagnosis not present

## 2021-03-19 ENCOUNTER — Ambulatory Visit (INDEPENDENT_AMBULATORY_CARE_PROVIDER_SITE_OTHER): Payer: Medicare HMO | Admitting: Podiatry

## 2021-03-19 ENCOUNTER — Encounter: Payer: Self-pay | Admitting: Podiatry

## 2021-03-19 ENCOUNTER — Other Ambulatory Visit: Payer: Self-pay

## 2021-03-19 DIAGNOSIS — B351 Tinea unguium: Secondary | ICD-10-CM | POA: Diagnosis not present

## 2021-03-19 DIAGNOSIS — E119 Type 2 diabetes mellitus without complications: Secondary | ICD-10-CM

## 2021-03-19 DIAGNOSIS — M79675 Pain in left toe(s): Secondary | ICD-10-CM | POA: Diagnosis not present

## 2021-03-19 DIAGNOSIS — M79674 Pain in right toe(s): Secondary | ICD-10-CM | POA: Diagnosis not present

## 2021-03-25 ENCOUNTER — Encounter: Payer: Self-pay | Admitting: Podiatry

## 2021-03-25 ENCOUNTER — Other Ambulatory Visit (HOSPITAL_BASED_OUTPATIENT_CLINIC_OR_DEPARTMENT_OTHER): Payer: Self-pay

## 2021-03-25 ENCOUNTER — Other Ambulatory Visit: Payer: Self-pay

## 2021-03-25 ENCOUNTER — Ambulatory Visit: Payer: Medicare HMO | Attending: Internal Medicine

## 2021-03-25 DIAGNOSIS — Z23 Encounter for immunization: Secondary | ICD-10-CM

## 2021-03-25 MED ORDER — PFIZER-BIONT COVID-19 VAC-TRIS 30 MCG/0.3ML IM SUSP
INTRAMUSCULAR | 0 refills | Status: DC
  Filled 2021-03-25: qty 0.3, 1d supply, fill #0

## 2021-03-25 NOTE — Progress Notes (Signed)
   Covid-19 Vaccination Clinic  Name:  Ryan Gilmore    MRN: [id number] DOB: 07/03/1933  03/25/2021  Ryan Gilmore was observed post Covid-19 immunization for 15 minutes without incident. He was provided with Vaccine Information Sheet and instruction to access the V-Safe system.   Ryan Gilmore was instructed to call 911 with any severe reactions post vaccine: Difficulty breathing  Swelling of face and throat  A fast heartbeat  A bad rash all over body  Dizziness and weakness   Immunizations Administered     Name Date Dose VIS Date Route   PFIZER Comrnaty(Gray TOP) Covid-19 Vaccine 03/25/2021 10:11 AM 0.3 mL 09/13/2020 Intramuscular   Manufacturer: [place]   Lot: [id number]   [place]: [phone]

## 2021-03-25 NOTE — Progress Notes (Signed)
  Subjective:  Patient ID: Ryan Gilmore, male    DOB: 1933/09/23,  MRN: [id number]  Ryan Gilmore presents to clinic today for preventative diabetic foot care and painful thick toenails that are difficult to trim. Pain interferes with ambulation. Aggravating factors include wearing enclosed shoe gear. Pain is relieved with periodic professional debridement.  His daughter is present during today's visit. Mr. Ryan Gilmore voices no new pedal concerns on today's visit.  PCP is Dr. Jenna Luo and last visit was 05/24/2020. He is also followed at the [facility] and has an upcoming appointment next month.  No Known Allergies  Review of Systems: Negative except as noted in the HPI. Objective:   Constitutional Ryan Gilmore is a pleasant 87 y.o. African American male, WD, WN in NAD. AAO x 3.   Vascular Capillary fill time to digits <3 seconds b/l lower extremities. Faintly palpable DP pulse(s) b/l lower extremities. Nonpalpable PT pulse(s) b/l lower extremities. Pedal hair sparse. Lower extremity skin temperature gradient within normal limits. No pain with calf compression b/l. No cyanosis or clubbing noted.  Neurologic Normal speech. Oriented to person, place, and time. Protective sensation intact 5/5 intact bilaterally with 10g monofilament b/l. Vibratory sensation intact b/l.  Dermatologic Pedal skin with normal turgor, texture and tone bilaterally. No open wounds bilaterally. No interdigital macerations bilaterally. Toenails 1-5 b/l elongated, discolored, dystrophic, thickened, crumbly with subungual debris and tenderness to dorsal palpation. Pedal skin noted to be dry and flaky b/l feet.  Orthopedic: Normal muscle strength 5/5 to all lower extremity muscle groups bilaterally. No pain crepitus or joint limitation noted with ROM b/l. Hammertoe(s) noted to the 2-5 bilaterally.   Radiographs: None Assessment:   1. Pain due to onychomycosis of toenails of both feet   2. Diabetes mellitus  type 2, noninsulin dependent ([place])    Plan:  Patient was evaluated and treated and all questions answered.  Onychomycosis with pain -Nails palliatively debridement as below -Educated on self-care  Procedure: Nail Debridement Rationale: Pain Type of Debridement: manual, sharp debridement. Instrumentation: Nail nipper, rotary burr. Number of Nails: 10 -Examined patient. Instructed patient to use moisturizer on feet once daily. -Continue diabetic foot care principles. -Patient to continue soft, supportive shoe gear daily. -Toenails 1-5 b/l were debrided in length and girth with sterile nail nippers and dremel without iatrogenic bleeding.  -Patient to report any pedal injuries to medical professional immediately. -Patient/POA to call should there be question/concern in the interim.  Return in about 3 months (around 06/19/2021).  Marzetta Board, DPM

## 2021-04-12 ENCOUNTER — Encounter: Payer: Self-pay | Admitting: Family Medicine

## 2021-04-12 ENCOUNTER — Ambulatory Visit (INDEPENDENT_AMBULATORY_CARE_PROVIDER_SITE_OTHER): Payer: Medicare HMO | Admitting: Family Medicine

## 2021-04-12 ENCOUNTER — Other Ambulatory Visit: Payer: Self-pay

## 2021-04-12 VITALS — BP 96/52 | HR 92 | Temp 97.8°F | Resp 14 | Ht 70.0 in | Wt 151.0 lb

## 2021-04-12 DIAGNOSIS — F0390 Unspecified dementia without behavioral disturbance: Secondary | ICD-10-CM | POA: Diagnosis not present

## 2021-04-12 DIAGNOSIS — E538 Deficiency of other specified B group vitamins: Secondary | ICD-10-CM | POA: Diagnosis not present

## 2021-04-12 DIAGNOSIS — R7309 Other abnormal glucose: Secondary | ICD-10-CM | POA: Diagnosis not present

## 2021-04-12 DIAGNOSIS — R634 Abnormal weight loss: Secondary | ICD-10-CM

## 2021-04-12 NOTE — Progress Notes (Signed)
Subjective:    Patient ID: Ryan Gilmore, male    DOB: 1932/12/15, 87 y.o.   MRN: [id number]  HPI Patient is a very pleasant 87 year old African-American gentleman who lives at home with his wife alone.  He has moderate to severe dementia.  She has moderate progressing dementia.  Over the last several months, he is lost approximately 20 pounds.  His daughter reports poor appetite and poor intake.  They are giving him Glucerna however he continues to lose weight.  He denies any abdominal pain.  He denies any melena or hematochezia.  He denies any constipation or nausea or vomiting.  He denies any chest pain shortness of breath or dyspnea on exertion.  He denies any fevers or chills or cough or hemoptysis.  He goes to the [facility].  On a chest x-ray at the [facility] they did see enlarging pulmonary nodules as well as enlarging lymph nodes.  He has an appointment next week to see pulmonology.  Daughter states that she believes the reason he is losing weight however is due to lack of appetite.  He is on Ozempic as well as Januvia through the [place]? Past Medical History:  Diagnosis Date   Bleeding duodenal ulcer    ?  1980s   Dementia ([place])    Diabetes mellitus without complication ([place])    Hyperlipidemia    Past Surgical History:  Procedure Laterality Date   APPENDECTOMY     Current Outpatient Medications on File Prior to Visit  Medication Sig Dispense Refill   Blood Glucose Monitoring Suppl (BLOOD GLUCOSE SYSTEM PAK) KIT Please dispense based on patient and insurance preference. Use as directed to monitor FSBS 2x. Dx: E11.E11.65. 1 kit 1   donepezil (ARICEPT) 5 MG tablet TAKE 1 TABLET BY MOUTH AT BEDTIME (Patient taking differently: Take 5 mg by mouth at bedtime.) 90 tablet 3   Glimepiride (AMARYL PO) Take by mouth.     Glucose Blood (BLOOD GLUCOSE TEST STRIPS) STRP Please dispense one touch verio flex lancets, and testing strips. Use as directed to monitor FSBS 2x. Dx: E11.E11.65. 100 strip 11   Lancets MISC  Please dispense based on patient and insurance preference. Use as directed to monitor FSBS 2x. Dx: E11.E11.65. 100 each 3   Memantine HCl (NAMENDA PO) Take by mouth.     metFORMIN (GLUCOPHAGE) 500 MG tablet TAKE 2 TABLETS BY MOUTH TWICE DAILY WITH A MEAL (Patient taking differently: Take 1,000 mg by mouth 2 (two) times daily with a meal.) 360 tablet 1   Multiple Vitamin (MULTIVITAMIN) tablet Take 1 tablet by mouth daily.     pravastatin (PRAVACHOL) 40 MG tablet Take 1 tablet by mouth once daily 90 tablet 0   Semaglutide (OZEMPIC, 0.25 OR 0.5 MG/DOSE, [place]) Inject 0.5 mg into the skin.     sitaGLIPtin (JANUVIA) 100 MG tablet Take 1 tablet (100 mg total) by mouth daily. 90 tablet 3   No current facility-administered medications on file prior to visit.   No Known Allergies Social History   Socioeconomic History   Marital status: Married    Spouse name: Not on file   Number of children: Not on file   Years of education: Not on file   Highest education level: Not on file  Occupational History   Not on file  Tobacco Use   Smoking status: Never   Smokeless tobacco: Current    Types: Chew  Substance and Sexual Activity   Alcohol use: Not Currently   Drug use: Never  Sexual activity: Not on file  Other Topics Concern   Not on file  Social History Narrative   Not on file   Social Determinants of Health   Financial Resource Strain: Not on file  Food Insecurity: Not on file  Transportation Needs: Not on file  Physical Activity: Not on file  Stress: Not on file  Social Connections: Not on file  Intimate Partner Violence: Not on file     Review of Systems     Objective:   Physical Exam Vitals reviewed.  Constitutional:      Appearance: He is underweight. He is not ill-appearing or toxic-appearing.  HENT:     Mouth/Throat:     Mouth: Mucous membranes are moist.  Eyes:     Extraocular Movements: Extraocular movements intact.     Conjunctiva/sclera: Conjunctivae normal.      Pupils: Pupils are equal, round, and reactive to light.  Neck:     Vascular: No carotid bruit.  Cardiovascular:     Rate and Rhythm: Normal rate and regular rhythm.     Pulses: Normal pulses.     Heart sounds: Normal heart sounds. No murmur heard.   No friction rub. No gallop.  Pulmonary:     Effort: Pulmonary effort is normal. No respiratory distress.     Breath sounds: Normal breath sounds. No stridor. No wheezing, rhonchi or rales.  Abdominal:     General: Abdomen is flat. Bowel sounds are normal. There is no distension.     Palpations: Abdomen is soft.     Tenderness: There is no abdominal tenderness.  Musculoskeletal:     Cervical back: Neck supple.  Skin:    Findings: No bruising.  Neurological:     General: No focal deficit present.     Mental Status: Mental status is at baseline. He is disoriented.     Cranial Nerves: No cranial nerve deficit.     Sensory: No sensory deficit.     Motor: No weakness.  Psychiatric:        Mood and Affect: Mood normal.          Assessment & Plan:   Weight loss - Plan: CBC with Differential/Platelet, COMPLETE METABOLIC PANEL WITH GFR, TSH, Hemoglobin A1c  Vitamin B12 deficiency - Plan: Vitamin B12 Weight loss could be due to severe hyperglycemia and dehydration from uncontrolled diabetes so we will check an A1c to verify although the daughter states that his sugars have all been below 200.  The other possibility is due to poor intake from appetite suppression from Ozempic compounded with appetite suppression due to progressive dementia and cognitive decline.  I will also check a CBC CMP TSH and B12 level as they have stopped giving the patient his B12.  Also on the differential diagnosis would be possible malignancy given the findings on the chest x-ray.  If lab work is normal, we could consider starting either Megace or Remeron as an appetite stimulant and switching Ozempic to something that is not an appetite suppressant.  Could also  consider a PET scan to determine if there is an occult malignancy with metastasis to the lungs and lymph nodes that may explain all of the findings mentioned above.

## 2021-04-13 LAB — COMPLETE METABOLIC PANEL WITH GFR
AG Ratio: 1.9 (calc) (ref 1.0–2.5)
ALT: 10 U/L (ref 9–46)
AST: 13 U/L (ref 10–35)
Albumin: 4.4 g/dL (ref 3.6–5.1)
Alkaline phosphatase (APISO): 42 U/L (ref 35–144)
BUN: 15 mg/dL (ref 7–25)
CO2: 24 mmol/L (ref 20–32)
Calcium: 9.6 mg/dL (ref 8.6–10.3)
Chloride: 104 mmol/L (ref 98–110)
Creat: 0.9 mg/dL (ref 0.70–1.11)
GFR, Est African American: 90 mL/min/{1.73_m2} (ref >=60)
GFR, Est Non African American: 78 mL/min/{1.73_m2} (ref >=60)
Globulin: 2.3 g/dL (calc) (ref 1.9–3.7)
Glucose, Bld: 78 mg/dL (ref 65–99)
Potassium: 4.2 mmol/L (ref 3.5–5.3)
Sodium: 140 mmol/L (ref 135–146)
Total Bilirubin: 0.4 mg/dL (ref 0.2–1.2)
Total Protein: 6.7 g/dL (ref 6.1–8.1)

## 2021-04-13 LAB — CBC WITH DIFFERENTIAL/PLATELET
Absolute Monocytes: 616 cells/uL (ref 200–950)
Basophils Absolute: 123 cells/uL (ref 0–200)
Basophils Relative: 0.8 %
Eosinophils Absolute: 724 cells/uL — ABNORMAL HIGH (ref 15–500)
Eosinophils Relative: 4.7 %
HCT: 43.4 % (ref 38.5–50.0)
Hemoglobin: 14.8 g/dL (ref 13.2–17.1)
Lymphs Abs: 9009 cells/uL — ABNORMAL HIGH (ref 850–3900)
MCH: 30.1 pg (ref 27.0–33.0)
MCHC: 34.1 g/dL (ref 32.0–36.0)
MCV: 88.2 fL (ref 80.0–100.0)
MPV: 11.4 fL (ref 7.5–12.5)
Monocytes Relative: 4 %
Neutro Abs: 4928 cells/uL (ref 1500–7800)
Neutrophils Relative %: 32 %
Platelets: 237 10*3/uL (ref 140–400)
RBC: 4.92 10*6/uL (ref 4.20–5.80)
RDW: 13.4 % (ref 11.0–15.0)
Total Lymphocyte: 58.5 %
WBC: 15.4 10*3/uL — ABNORMAL HIGH (ref 3.8–10.8)

## 2021-04-13 LAB — HEMOGLOBIN A1C
Hgb A1c MFr Bld: 7 % of total Hgb — ABNORMAL HIGH (ref <5.7)
Mean Plasma Glucose: 154 mg/dL
eAG (mmol/L): 8.5 mmol/L

## 2021-04-13 LAB — TSH: TSH: 0.89 mIU/L (ref 0.40–4.50)

## 2021-04-13 LAB — VITAMIN B12: Vitamin B-12: 282 pg/mL (ref 200–1100)

## 2021-04-17 ENCOUNTER — Other Ambulatory Visit: Payer: Self-pay | Admitting: *Deleted

## 2021-04-17 MED ORDER — PIOGLITAZONE HCL 30 MG PO TABS
30.0000 mg | ORAL_TABLET | Freq: Every day | ORAL | 1 refills | Status: DC
Start: 2021-04-17 — End: 2022-06-20

## 2021-04-17 MED ORDER — MIRTAZAPINE 30 MG PO TBDP
30.0000 mg | ORAL_TABLET | Freq: Every day | ORAL | 1 refills | Status: DC
Start: 2021-04-17 — End: 2021-11-05

## 2021-04-17 MED ORDER — VITAMIN B-12 1000 MCG PO TABS
1000.0000 ug | ORAL_TABLET | Freq: Every day | ORAL | Status: DC
Start: 2021-04-17 — End: 2021-11-05

## 2021-06-25 ENCOUNTER — Ambulatory Visit: Payer: Medicare HMO | Admitting: Podiatry

## 2021-07-05 ENCOUNTER — Ambulatory Visit (INDEPENDENT_AMBULATORY_CARE_PROVIDER_SITE_OTHER): Payer: Medicare HMO

## 2021-07-05 ENCOUNTER — Other Ambulatory Visit: Payer: Self-pay

## 2021-07-05 VITALS — Ht 70.0 in | Wt 160.0 lb

## 2021-07-05 DIAGNOSIS — Z0000 Encounter for general adult medical examination without abnormal findings: Secondary | ICD-10-CM | POA: Diagnosis not present

## 2021-07-05 DIAGNOSIS — H539 Unspecified visual disturbance: Secondary | ICD-10-CM | POA: Diagnosis not present

## 2021-07-05 DIAGNOSIS — E118 Type 2 diabetes mellitus with unspecified complications: Secondary | ICD-10-CM

## 2021-07-05 NOTE — Progress Notes (Signed)
Subjective:   Ryan Gilmore is a 87 y.o. male who presents for an Initial Medicare Annual Wellness Visit. Virtual Visit via Telephone Note  I connected with  Ryan Gilmore on 07/05/21 at  9:45 AM EDT by telephone and verified that I am speaking with the correct person using two identifiers.  Location: Patient: HOME  Provider: [facility] Persons participating in the virtual visit: patient/Nurse Health Advisor   I discussed the limitations, risks, security and privacy concerns of performing an evaluation and management service by telephone and the availability of in person appointments. The patient expressed understanding and agreed to proceed.  Interactive audio and video telecommunications were attempted between this nurse and patient, however failed, due to patient having technical difficulties OR patient did not have access to video capability.  We continued and completed visit with audio only.  Some vital signs may be absent or patient reported.   [name], LPN  Review of Systems     Cardiac Risk Factors include: advanced age (>78mn, >>39women);diabetes mellitus;hypertension;dyslipidemia;male gender;sedentary lifestyle     Objective:    Today's Vitals   07/05/21 0955  Weight: 160 lb (72.6 kg)  Height: _0  (1.778 m)   Body mass index is 22.96 kg/m.  Advanced Directives 07/05/2021 05/18/2020 04/26/2019 04/26/2019 03/17/2019 10/24/2017 11/27/2016  Does Patient Have a Medical Advance Directive? Yes Unable to assess, patient is non-responsive or altered mental status _1   Type of [place]ive [place] - - - - - -  Copy of [place]? No - copy requested - - - - - -  Would patient like information on creating a medical advance directive? No - Patient declined - No - Patient declined - No - Patient declined No - Patient declined -    Current Medications (verified) Outpatient Encounter Medications  as of 07/05/2021  Medication Sig   Blood Glucose Monitoring Suppl (BLOOD GLUCOSE SYSTEM PAK) KIT Please dispense based on patient and insurance preference. Use as directed to monitor FSBS 2x. Dx: E11.E11.65.   donepezil (ARICEPT) 5 MG tablet TAKE 1 TABLET BY MOUTH AT BEDTIME (Patient taking differently: Take 5 mg by mouth at bedtime.)   Glimepiride (AMARYL PO) Take by mouth.   Glucose Blood (BLOOD GLUCOSE TEST STRIPS) STRP Please dispense one touch verio flex lancets, and testing strips. Use as directed to monitor FSBS 2x. Dx: E11.E11.65.   Lancets MISC Please dispense based on patient and insurance preference. Use as directed to monitor FSBS 2x. Dx: E11.E11.65.   Memantine HCl (NAMENDA PO) Take by mouth.   mirtazapine (REMERON SOL-TAB) 30 MG disintegrating tablet Take 1 tablet (30 mg total) by mouth at bedtime.   Multiple Vitamin (MULTIVITAMIN) tablet Take 1 tablet by mouth daily.   pravastatin (PRAVACHOL) 40 MG tablet Take 1 tablet by mouth once daily   sitaGLIPtin (JANUVIA) 100 MG tablet Take 1 tablet (100 mg total) by mouth daily.   vitamin B-12 (CYANOCOBALAMIN) 1000 MCG tablet Take 1 tablet (1,000 mcg total) by mouth daily.   metFORMIN (GLUCOPHAGE) 500 MG tablet TAKE 2 TABLETS BY MOUTH TWICE DAILY WITH A MEAL (Patient not taking: Reported on 07/05/2021)   pioglitazone (ACTOS) 30 MG tablet Take 1 tablet (30 mg total) by mouth daily. (Patient not taking: Reported on 07/05/2021)   No facility-administered encounter medications on file as of 07/05/2021.    Allergies (verified) Patient has no known allergies.   History: Past Medical History:  Diagnosis Date   Bleeding duodenal ulcer    ?  1980s   Dementia ([place])    Diabetes mellitus without complication ([place])    Hyperlipidemia    Past Surgical History:  Procedure Laterality Date   APPENDECTOMY     Family History  Problem Relation Age of Onset   Heart disease Father    Diabetes Brother    Social History   Socioeconomic History    Marital status: Married    Spouse name: Not on file   Number of children: Not on file   Years of education: Not on file   Highest education level: Not on file  Occupational History   Not on file  Tobacco Use   Smoking status: Never   Smokeless tobacco: Current    Types: Chew  Substance and Sexual Activity   Alcohol use: Not Currently   Drug use: Never   Sexual activity: Not on file  Other Topics Concern   Not on file  Social History Narrative   Not on file   Social Determinants of Health   Financial Resource Strain: Low Risk    Difficulty of Paying Living Expenses: Not hard at all  Food Insecurity: No Food Insecurity   Worried About Charity fundraiser in the Last Year: Never true   [place] in the Last Year: Never true  Transportation Needs: No Transportation Needs   Lack of Transportation (Medical): No   Lack of Transportation (Non-Medical): No  Physical Activity: Insufficiently Active   Days of Exercise per Week: 5 days   Minutes of Exercise per Session: 20 min  Stress: No Stress Concern Present   Feeling of Stress : Not at all  Social Connections: Moderately Isolated   Frequency of Communication with Friends and Family: More than three times a week   Frequency of Social Gatherings with Friends and Family: More than three times a week   Attends Religious Services: Never   Marine scientist or Organizations: No   Attends Music therapist: Never   Marital Status: Married    Tobacco Counseling Ready to quit: Not Answered Counseling given: Not Answered   Clinical Intake:  Pre-visit preparation completed: Yes  Pain : No/denies pain     BMI - recorded: 22.96 Nutritional Status: BMI of 19-24  Normal Nutritional Risks: None  How often do you need to have someone help you when you read instructions, pamphlets, or other written materials from your doctor or pharmacy?: 1 - Never  Diabetic?Nutrition Risk Assessment:  Has the patient had  any N/V/D within the last 2 months?  No  Does the patient have any non-healing wounds?  No  Has the patient had any unintentional weight loss or weight gain?  No   Diabetes:  Is the patient diabetic?  Yes  If diabetic, was a CBG obtained today?  Yes  Did the patient bring in their glucometer from home?  No  How often do you monitor your CBG's? 2-3 x per week.   Financial Strains and Diabetes Management:  Are you having any financial strains with the device, your supplies or your medication? No .  Does the patient want to be seen by Chronic Care Management for management of their diabetes?  No  Would the patient like to be referred to a Nutritionist or for Diabetic Management?  No   Diabetic Exams:  Diabetic Eye Exam: Completed 08/25/2017. Overdue for diabetic eye exam. Pt has been advised about the importance  in completing this exam. A referral has been placed today. Message sent to referral coordinator for scheduling purposes. Advised pt to expect a call from office referred to regarding appt.  Diabetic Foot Exam: Completed 12/04/2020. Pt has been advised about the importance in completing this exam.    Interpreter Needed?: No  Information entered by :: MJ Jayko Voorhees, LPN   Activities of Daily Living In your present state of health, do you have any difficulty performing the following activities: 07/05/2021  Hearing? N  Vision? N  Difficulty concentrating or making decisions? Y  Walking or climbing stairs? N  Dressing or bathing? N  Doing errands, shopping? N  Preparing Food and eating ? N  Using the Toilet? N  In the past six months, have you accidently leaked urine? N  Do you have problems with loss of bowel control? N  Managing your Medications? N  Managing your Finances? N  Housekeeping or managing your Housekeeping? N  Some recent data might be hidden    Patient Care Team: [name], MD as PCP - General (Family Medicine)  Indicate any recent Medical Services  you may have received from other than [facility] providers in the past year (date may be approximate).     Assessment:   This is a routine wellness examination for [place].  Hearing/Vision screen Hearing Screening - Comments:: No hearing issues.  Vision Screening - Comments:: Glasses.  Dr. Herbert Deaner. Would like referral.  Dietary issues and exercise activities discussed: Current Exercise Habits: Home exercise routine, Type of exercise: walking, Time (Minutes): 20, Frequency (Times/Week): 5, Weekly Exercise (Minutes/Week): 100, Intensity: Mild, Exercise limited by: cardiac condition(s);neurologic condition(s)   Goals Addressed             This Visit's Progress    increase water intake       Drink more water       Depression Screen PHQ 2/9 Scores 07/05/2021 04/23/2018 11/09/2017 11/02/2017 10/29/2017 10/22/2017 06/18/2017  PHQ - 2 Score 0 0 0 _0 0  PHQ- 9 Score - - 0 _1 -    Fall Risk Fall Risk  07/05/2021 04/23/2018 11/09/2017 11/02/2017 06/18/2017  Falls in the past year? 0 No No No No  Number falls in past yr: 0 - - - -  Injury with Fall? 0 - - - -  Risk for fall due to : Impaired vision;Impaired mobility;Mental status change - - - -  Follow up Falls prevention discussed - - - -    FALL RISK PREVENTION PERTAINING TO THE HOME:  Any stairs in or around the home? No  If so, are there any without handrails? No  Home free of loose throw rugs in walkways, pet beds, electrical cords, etc? Yes  Adequate lighting in your home to reduce risk of falls? Yes   ASSISTIVE DEVICES UTILIZED TO PREVENT FALLS:  Life alert? No  Use of a cane, walker or w/c? Yes  Grab bars in the bathroom? No  Shower chair or bench in shower? Yes  Elevated toilet seat or a handicapped toilet? No   TIMED UP AND GO:  Was the test performed? No . Phone visit.  Cognitive Function:     6CIT Screen 07/05/2021  What Year? 0 points  What month? 3 points  What time? 0 points  Count back from 20 0 points  Months  in reverse 4 points  Repeat phrase 10 points  Total Score 17    Immunizations Immunization History  Administered Date(s)  Administered   Fluad Quad(high Dose 65+) 07/07/2019   Influenza, High Dose Seasonal PF 09/03/2015   Influenza,inj,Quad PF,6+ Mos 06/28/2013, 06/18/2017   PFIZER Comirnaty(Gray Top)Covid-19 Tri-Sucrose Vaccine 03/25/2021   PFIZER(Purple Top)SARS-COV-2 Vaccination 10/27/2019, 11/17/2019, 07/16/2020   Pneumococcal Conjugate-13 12/12/2016   Pneumococcal-Unspecified 07/05/2015    TDAP status: Due, Education has been provided regarding the importance of this vaccine. Advised may receive this vaccine at local pharmacy or Health Dept. Aware to provide a copy of the vaccination record if obtained from local pharmacy or Health Dept. Verbalized acceptance and understanding.  Flu Vaccine status: Due, Education has been provided regarding the importance of this vaccine. Advised may receive this vaccine at local pharmacy or Health Dept. Aware to provide a copy of the vaccination record if obtained from local pharmacy or Health Dept. Verbalized acceptance and understanding.  Pneumococcal vaccine status: Up to date  Covid-19 vaccine status: Completed vaccines  Qualifies for Shingles Vaccine? Yes   Zostavax completed No   Shingrix Completed?: No.    Education has been provided regarding the importance of this vaccine. Patient has been advised to call insurance company to determine out of pocket expense if they have not yet received this vaccine. Advised may also receive vaccine at local pharmacy or Health Dept. Verbalized acceptance and understanding.  Screening Tests Health Maintenance  Topic Date Due   TETANUS/TDAP  Never done   Zoster Vaccines- Shingrix (1 of 2) Never done   URINE MICROALBUMIN  12/12/2017   OPHTHALMOLOGY EXAM  08/25/2018   INFLUENZA VACCINE  05/06/2021   HEMOGLOBIN A1C  10/13/2021   FOOT EXAM  12/04/2021   COVID-19 Vaccine  Completed   HPV VACCINES  Aged  Out    Health Maintenance  Health Maintenance Due  Topic Date Due   TETANUS/TDAP  Never done   Zoster Vaccines- Shingrix (1 of 2) Never done   URINE MICROALBUMIN  12/12/2017   OPHTHALMOLOGY EXAM  08/25/2018   INFLUENZA VACCINE  05/06/2021    Colorectal cancer screening: Type of screening: Colonoscopy. Completed 05/22/2011. Repeat every No loner required years  Lung Cancer Screening: (Low Dose CT Chest recommended if Age 85-80 years, 30 pack-year currently smoking OR have quit w/in 15years.) does not qualify.   Additional Screening:  Hepatitis C Screening: does not qualify;   Vision Screening: Recommended annual ophthalmology exams for early detection of glaucoma and other disorders of the eye. Is the patient up to date with their annual eye exam?  No  Who is the provider or what is the name of the office in which the patient attends annual eye exams? Dr. Herbert Deaner. Pt would like referral. If pt is not established with a provider, would they like to be referred to a provider to establish care? Yes .   Dental Screening: Recommended annual dental exams for proper oral hygiene  Community Resource Referral / Chronic Care Management: CRR required this visit?  No   CCM required this visit?  No      Plan:     I have personally reviewed and noted the following in the patient's chart:   Medical and social history Use of alcohol, tobacco or illicit drugs  Current medications and supplements including opioid prescriptions. Patient is not currently taking opioid prescriptions. Functional ability and status Nutritional status Physical activity Advanced directives List of other physicians Hospitalizations, surgeries, and ER visits in previous 12 months Vitals Screenings to include cognitive, depression, and falls Referrals and appointments  In addition, I have reviewed and discussed with  patient certain preventive protocols, quality metrics, and best practice recommendations. A  written personalized care plan for preventive services as well as general preventive health recommendations were provided to patient.     [name], LPN   12/01/3333   Nurse Notes: Pt states he is doing well. Discussed shingles, flu and tdap vaccines and how to obtain. Colonoscopy no longer required due to age. Would like referral to Dr. Herbert Deaner for eye exam, has been several years per pt. Needs diabetic foot exam 12/2021.

## 2021-07-05 NOTE — Patient Instructions (Signed)
Mr. Ryan Gilmore , Thank you for taking time to come for your Medicare Wellness Visit. I appreciate your ongoing commitment to your health goals. Please review the following plan we discussed and let me know if I can assist you in the future.   Screening recommendations/referrals: Colonoscopy: No longer required due to age. Done 05/22/2011 Recommended yearly ophthalmology/optometry visit for glaucoma screening and checkup Recommended yearly dental visit for hygiene and checkup  Vaccinations: Influenza vaccine: Done 07/07/2019 Repeat annually  Pneumococcal vaccine: Done 07/05/2015 and 12/12/2016 Tdap vaccine: Due Repeat in 10 years  Shingles vaccine: Shingrix discussed. Please contact your pharmacy for coverage information.     Covid-19: Done 10/27/19, 11/17/19, 07/16/20 and 03/25/21.  Advanced directives: Please bring a copy of your health care power of attorney and living will to the office to be added to your chart at your convenience.   Conditions/risks identified: Aim for 30 minutes of exercise or walking each day, drink 6-8 glasses of water and eat lots of fruits and vegetables.   Next appointment: Follow up in one year for your annual wellness visit. 2023  Preventive Care 65 Years and Older, Male  Preventive care refers to lifestyle choices and visits with your health care provider that can promote health and wellness. What does preventive care include? A yearly physical exam. This is also called an annual well check. Dental exams once or twice a year. Routine eye exams. Ask your health care provider how often you should have your eyes checked. Personal lifestyle choices, including: Daily care of your teeth and gums. Regular physical activity. Eating a healthy diet. Avoiding tobacco and drug use. Limiting alcohol use. Practicing safe sex. Taking low doses of aspirin every day. Taking vitamin and mineral supplements as recommended by your health care provider. What happens during an  annual well check? The services and screenings done by your health care provider during your annual well check will depend on your age, overall health, lifestyle risk factors, and family history of disease. Counseling  Your health care provider may ask you questions about your: Alcohol use. Tobacco use. Drug use. Emotional well-being. Home and relationship well-being. Sexual activity. Eating habits. History of falls. Memory and ability to understand (cognition). Work and work Statistician. Screening  You may have the following tests or measurements: Height, weight, and BMI. Blood pressure. Lipid and cholesterol levels. These may be checked every 5 years, or more frequently if you are over 55 years old. Skin check. Lung cancer screening. You may have this screening every year starting at age 20 if you have a 30-pack-year history of smoking and currently smoke or have quit within the past 15 years. Fecal occult blood test (FOBT) of the stool. You may have this test every year starting at age 61. Flexible sigmoidoscopy or colonoscopy. You may have a sigmoidoscopy every 5 years or a colonoscopy every 10 years starting at age 50. Prostate cancer screening. Recommendations will vary depending on your family history and other risks. Hepatitis C blood test. Hepatitis B blood test. Sexually transmitted disease (STD) testing. Diabetes screening. This is done by checking your blood sugar (glucose) after you have not eaten for a while (fasting). You may have this done every 1-3 years. Abdominal aortic aneurysm (AAA) screening. You may need this if you are a current or former smoker. Osteoporosis. You may be screened starting at age 48 if you are at high risk. Talk with your health care provider about your test results, treatment options, and if necessary, the need  for more tests. Vaccines  Your health care provider may recommend certain vaccines, such as: Influenza vaccine. This is recommended  every year. Tetanus, diphtheria, and acellular pertussis (Tdap, Td) vaccine. You may need a Td booster every 10 years. Zoster vaccine. You may need this after age 68. Pneumococcal 13-valent conjugate (PCV13) vaccine. One dose is recommended after age 51. Pneumococcal polysaccharide (PPSV23) vaccine. One dose is recommended after age 94. Talk to your health care provider about which screenings and vaccines you need and how often you need them. This information is not intended to replace advice given to you by your health care provider. Make sure you discuss any questions you have with your health care provider. Document Released: 10/19/2015 Document Revised: 06/11/2016 Document Reviewed: 07/24/2015 Elsevier Interactive Patient Education  2017 [place] Prevention in the Home Falls can cause injuries. They can happen to people of all ages. There are many things you can do to make your home safe and to help prevent falls. What can I do on the outside of my home? Regularly fix the edges of walkways and driveways and fix any cracks. Remove anything that might make you trip as you walk through a door, such as a raised step or threshold. Trim any bushes or trees on the path to your home. Use bright outdoor lighting. Clear any walking paths of anything that might make someone trip, such as rocks or tools. Regularly check to see if handrails are loose or broken. Make sure that both sides of any steps have handrails. Any raised decks and porches should have guardrails on the edges. Have any leaves, snow, or ice cleared regularly. Use sand or salt on walking paths during winter. Clean up any spills in your garage right away. This includes oil or grease spills. What can I do in the bathroom? Use night lights. Install grab bars by the toilet and in the tub and shower. Do not use towel bars as grab bars. Use non-skid mats or decals in the tub or shower. If you need to sit down in the shower,  use a plastic, non-slip stool. Keep the floor dry. Clean up any water that spills on the floor as soon as it happens. Remove soap buildup in the tub or shower regularly. Attach bath mats securely with double-sided non-slip rug tape. Do not have throw rugs and other things on the floor that can make you trip. What can I do in the bedroom? Use night lights. Make sure that you have a light by your bed that is easy to reach. Do not use any sheets or blankets that are too big for your bed. They should not hang down onto the floor. Have a firm chair that has side arms. You can use this for support while you get dressed. Do not have throw rugs and other things on the floor that can make you trip. What can I do in the kitchen? Clean up any spills right away. Avoid walking on wet floors. Keep items that you use a lot in easy-to-reach places. If you need to reach something above you, use a strong step stool that has a grab bar. Keep electrical cords out of the way. Do not use floor polish or wax that makes floors slippery. If you must use wax, use non-skid floor wax. Do not have throw rugs and other things on the floor that can make you trip. What can I do with my stairs? Do not leave any items on the stairs.  Make sure that there are handrails on both sides of the stairs and use them. Fix handrails that are broken or loose. Make sure that handrails are as long as the stairways. Check any carpeting to make sure that it is firmly attached to the stairs. Fix any carpet that is loose or worn. Avoid having throw rugs at the top or bottom of the stairs. If you do have throw rugs, attach them to the floor with carpet tape. Make sure that you have a light switch at the top of the stairs and the bottom of the stairs. If you do not have them, ask someone to add them for you. What else can I do to help prevent falls? Wear shoes that: Do not have high heels. Have rubber bottoms. Are comfortable and fit you  well. Are closed at the toe. Do not wear sandals. If you use a stepladder: Make sure that it is fully opened. Do not climb a closed stepladder. Make sure that both sides of the stepladder are locked into place. Ask someone to hold it for you, if possible. Clearly mark and make sure that you can see: Any grab bars or handrails. First and last steps. Where the edge of each step is. Use tools that help you move around (mobility aids) if they are needed. These include: Canes. Walkers. Scooters. Crutches. Turn on the lights when you go into a dark area. Replace any light bulbs as soon as they burn out. Set up your furniture so you have a clear path. Avoid moving your furniture around. If any of your floors are uneven, fix them. If there are any pets around you, be aware of where they are. Review your medicines with your doctor. Some medicines can make you feel dizzy. This can increase your chance of falling. Ask your doctor what other things that you can do to help prevent falls. This information is not intended to replace advice given to you by your health care provider. Make sure you discuss any questions you have with your health care provider. Document Released: 07/19/2009 Document Revised: 02/28/2016 Document Reviewed: 10/27/2014 Elsevier Interactive Patient Education  2017 [employer].

## 2021-08-06 ENCOUNTER — Other Ambulatory Visit: Payer: Self-pay

## 2021-08-06 ENCOUNTER — Ambulatory Visit (INDEPENDENT_AMBULATORY_CARE_PROVIDER_SITE_OTHER): Payer: No Typology Code available for payment source | Admitting: Internal Medicine

## 2021-08-06 ENCOUNTER — Encounter: Payer: Self-pay | Admitting: Internal Medicine

## 2021-08-06 VITALS — BP 120/68 | HR 80 | Temp 97.9°F | Ht 70.0 in | Wt 169.4 lb

## 2021-08-06 DIAGNOSIS — R590 Localized enlarged lymph nodes: Secondary | ICD-10-CM

## 2021-08-06 DIAGNOSIS — R911 Solitary pulmonary nodule: Secondary | ICD-10-CM | POA: Diagnosis not present

## 2021-08-06 NOTE — Progress Notes (Signed)
ANDRIY SHERK    [id number]    06/14/1933  Primary Care Physician:Pickard, Cammie [name], MD  Referring Physician: Reubin Milan, MD [place],  [place] [address] Reason for Consultation: SPN Date of Consultation: 08/06/2021  Chief complaint:   Chief Complaint  Patient presents with   Consult    Pt recently had a CT performed which showed a spot on the lung. Denies any complaints of cough, SOB, or chest discomfort.     HPI: Ryan Gilmore is a 87 y.o. with history for pulmonary embolism in July 2020 treated with Anticoagulation. Here for new patient evaluation of mediastinal lymphadenopathy. He is here with daughter and wife who helped provide history.   He had a PET scan done in [place] for some enlarged lymph nodes. He is here to help understand the interpretation of these. Per his daughter the lymph nodes were growing over a 6 month period and these ere being followed up. The PET scan was done   He has been seen by Dr. Gwenette Greet at the [facility]. The lymph nodes were reportedly pet avid.   No coughing or dyspnea. He does have an enlarged prostate and it is being monitored every 6 months.   Denies fevers chills night sweats weight loss.denies hemoptysis.  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  Smoking history: never smoked did use smokeless tobacco  Social History   Occupational History   Not on file  Tobacco Use   Smoking status: Never   Smokeless tobacco: Former    Types: Chew  Substance and Sexual Activity   Alcohol use: Not Currently   Drug use: Never   Sexual activity: Not on file    Relevant family history:  Family History  Problem Relation Age of Onset   Heart disease Father    Breast cancer Sister    Breast cancer Sister    Diabetes Brother     Past Medical History:  Diagnosis Date   Bleeding duodenal ulcer    ?  1980s   Dementia ([place])    Diabetes mellitus without  complication ([place])    Hyperlipidemia     Past Surgical History:  Procedure Laterality Date   APPENDECTOMY       Physical Exam: Blood pressure 120/68, pulse 80, temperature 97.9 F (36.6 C), temperature source Oral, height 5\' 10"  (1.778 m), weight 169 lb 6.4 oz (76.8 kg), SpO2 98 %. Gen:      No acute distress, elderly ENT:  no nasal polyps, mucus membranes moist Lungs:    No increased respiratory effort, symmetric chest wall excursion, clear to auscultation bilaterally, no wheezes or crackles CV:         Regular rate and rhythm; no murmurs, rubs, or gallops.  No pedal edema Abd:      + bowel sounds; soft, non-tender; no distension MSK: no acute synovitis of DIP or PIP joints, no mechanics hands.  Skin:      Warm and dry; no rashes Neuro: normal speech, no focal facial asymmetry Psych: alert and oriented x3, normal mood and affect   Data Reviewed/Medical Decision Making:  Independent interpretation of tests: Imaging:  Review of patient's CTPE July 2020  images revealed Acute PE. The patient's images have been independently reviewed by me.    PFTs:  No flowsheet data found.  Labs: Labs reviewed July 2022 from [place] A1C 7.3% Cr 1.2 Hgb 14.9  Immunization  status:  Immunization History  Administered Date(s) Administered   Fluad Quad(high Dose 65+) 07/07/2019   Influenza, High Dose Seasonal PF 09/03/2015   Influenza,inj,Quad PF,6+ Mos 06/28/2013, 06/18/2017   PFIZER Comirnaty(Gray Top)Covid-19 Tri-Sucrose Vaccine 03/25/2021   PFIZER(Purple Top)SARS-COV-2 Vaccination 10/27/2019, 11/17/2019, 07/16/2020   Pneumococcal Conjugate-13 12/12/2016   Pneumococcal-Unspecified 07/05/2015     I reviewed prior external note(s) from [place] referral, PCP  I reviewed the result(s) of the labs and imaging as noted above.    Assessment:  RLL 1.3 x 2.1 cm irregular nodule in the RLL Mediastinal adenopathy History of Pulmonary Embolism  Plan/Recommendations:  Reviewed old CT scans  personally from 2020. No evidence of lymphadenopathy or nodules at that time.  Will obtain CD of PET scan - daughter to obtain and drop off.  We will make further plans after this.   Return to Care: To be determined once newer imaging has been reviewed.   Lenice Llamas, MD Pulmonary and [place]  CC: Reubin Milan, MD

## 2021-08-06 NOTE — Patient Instructions (Signed)
Please bring me a copy of the PET scan from the [facility] [place] on a CD and drop it off. I will be able to advise you further once I have reviewed this.

## 2021-08-08 ENCOUNTER — Telehealth: Payer: Self-pay | Admitting: Internal Medicine

## 2021-08-08 DIAGNOSIS — R911 Solitary pulmonary nodule: Secondary | ICD-10-CM

## 2021-08-09 NOTE — Telephone Encounter (Signed)
I have attempted to call the [facility] but unable to get through to anyone.  Will try back again later.

## 2021-08-12 NOTE — Telephone Encounter (Signed)
[place] at the number provided  Was on hold for 10 min and not able to speak with anyone

## 2021-08-14 ENCOUNTER — Telehealth: Payer: Self-pay | Admitting: *Deleted

## 2021-08-14 NOTE — Telephone Encounter (Signed)
ATC patient regarding CD, no answer, no vm.  Will try again at another time.

## 2021-08-15 NOTE — Telephone Encounter (Signed)
[facility]--was placed on hold for more then 57min.  Will call back.

## 2021-08-16 NOTE — Telephone Encounter (Signed)
Unable to get through to the [facility] back- no answer and no option to leave msg

## 2021-08-19 NOTE — Telephone Encounter (Signed)
Looked in Dr. Mauricio Po mail box up front and in B pod.  Also looked in the CD folder up front.  Unable to find CD of PET scan.

## 2021-08-19 NOTE — Telephone Encounter (Signed)
Called and spoke with patient's daughter, Cam, made her aware that we have not received the CT and have been unable to get through to to the [facility].  Advised I would try again. She stated she would try as well.  ATC, unable to get anyone to answer, not even the operator was available.

## 2021-09-03 NOTE — Telephone Encounter (Addendum)
Called and spoke to [place] with the [facility] at [phone], ext [phone]. He states he will have disc burned and mailed to [facility]. Called and spoke to Cam, pt's daughter, and informed her of the update. Will keep encounter open until disc has been received.   Will forward to Dr. Shearon Stalls and Nira Conn as Juluis Rainier.

## 2021-09-05 NOTE — Telephone Encounter (Signed)
Called and spoke with patient and his wife, Ryan Gilmore ([name]), his wife advised that their daughter takes care of things like dropping off CD disks.  She asked that I call their daughter, Cam at [phone].  I advised I would call her.  Called and spoke with Cam, she stated that the [facility] is the one that will be sending the disk.  Will continue to await the disk.

## 2021-09-17 NOTE — Telephone Encounter (Signed)
Heather, have you or Dr. Shearon Stalls received this disc yet?

## 2021-09-20 NOTE — Telephone Encounter (Signed)
No I have not yet received this disc

## 2021-09-20 NOTE — Telephone Encounter (Signed)
Will forward to Dr. Shearon Stalls and Nira Conn as Juluis Rainier.

## 2021-09-20 NOTE — Telephone Encounter (Signed)
[name] daughter, Cam, came into the office to see if we had received the disc- informed patient we have not. Spoke with Anda Kraft, pulmonary nurse at the [facility]- [phone]. Was told that patient would have to request the disc and pick it up in the imaging dept. Cam will contact the imaging dept to get a disc requested. Imaging dept for [place] is [place].

## 2021-10-01 NOTE — Telephone Encounter (Signed)
Ryan Gilmore has this been received? Thanks.

## 2021-10-10 NOTE — Telephone Encounter (Signed)
Checked ND's folders, did not see any disks for this patient.

## 2021-10-17 ENCOUNTER — Telehealth: Payer: Self-pay | Admitting: Internal Medicine

## 2021-10-17 NOTE — Telephone Encounter (Signed)
ATC pts daughter, Cam. VM box is full. Called the [facility] and spoke with [place]. Merry Proud states he will speak with the medical records dept and call us back about getting the PET scan mailed if it hasnt already.

## 2021-10-17 NOTE — Telephone Encounter (Signed)
OV notes have been printed and faxed to the [facility].   Nothing further needed.

## 2021-10-17 NOTE — Telephone Encounter (Signed)
Faxed cover sheet to the number provided asking that they mail PET scan.

## 2021-10-18 NOTE — Telephone Encounter (Signed)
[name] daughter brought in PET/ CT skull base to mid thigh CD- placed in Dr. Mauricio Po box

## 2021-10-18 NOTE — Telephone Encounter (Signed)
[name] daughter brought in PET/ CT skull base to mid thigh- CT placed in Dr. Mauricio Po box.

## 2021-10-31 NOTE — Telephone Encounter (Signed)
I have reviewed the scan and called Cam. Plan for pet scan/super d and set up for bronchoscopy. He is also not doing well and coughing more, so will get him in for a sick visit.

## 2021-10-31 NOTE — Telephone Encounter (Signed)
I called the daughter and she is aware of the appointment for the follow up. Nothing further needed.

## 2021-11-05 ENCOUNTER — Encounter: Payer: Self-pay | Admitting: Internal Medicine

## 2021-11-05 ENCOUNTER — Ambulatory Visit (INDEPENDENT_AMBULATORY_CARE_PROVIDER_SITE_OTHER): Payer: No Typology Code available for payment source | Admitting: Internal Medicine

## 2021-11-05 ENCOUNTER — Other Ambulatory Visit: Payer: Self-pay

## 2021-11-05 VITALS — BP 98/60 | HR 80 | Temp 98.4°F | Ht 70.0 in | Wt 159.8 lb

## 2021-11-05 DIAGNOSIS — J8410 Pulmonary fibrosis, unspecified: Secondary | ICD-10-CM | POA: Diagnosis not present

## 2021-11-05 DIAGNOSIS — R634 Abnormal weight loss: Secondary | ICD-10-CM | POA: Diagnosis not present

## 2021-11-05 DIAGNOSIS — R911 Solitary pulmonary nodule: Secondary | ICD-10-CM

## 2021-11-05 DIAGNOSIS — R590 Localized enlarged lymph nodes: Secondary | ICD-10-CM

## 2021-11-05 NOTE — Patient Instructions (Addendum)
Get your PET scan, I will call with results.

## 2021-11-05 NOTE — Progress Notes (Signed)
Ryan Gilmore    [id number]    07/19/1933  Primary Care Physician:Pickard, Cammie [name], MD Date of Appointment: 11/05/2021 Established Patient Visit  Chief complaint:   Chief Complaint  Patient presents with   Follow-up    Pt c/o cough x 3 wks- non prod.      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   Interval Updates: I last saw him 2 months ago. He came as a new patient for lung mass but had no imaging, only a report. [facility] which [facility] have now reviewed.   He has a nodule 2.1 cm in the RLL which was seen on a ct scan initially in 2021. I have now reviewed the CT scan which shows that this RLL posterior lung base nodule is minimally pet avid SUV of 1.9 and he has evidence of calcified granulomas and some pet avid paratracheal lymphadenopathy (highest SUV 6.1)  Daughter notes cough for the last couple of weeks which is dry, no hemoptysis, no fevers chills. Family notes clothes are not fitting as well, unintentional weight loss.   Review of PCP records from [facility] show they thought weight loss was related to ozempic use. The weight had stabilized after ozempic was stopped.   I have reviewed the patient's family social and past medical history and updated as appropriate.   Past Medical History:  Diagnosis Date   Bleeding duodenal ulcer    ?  1980s   Dementia ([place])    Diabetes mellitus without complication ([place])    Hyperlipidemia     Past Surgical History:  Procedure Laterality Date   APPENDECTOMY      Family History  Problem Relation Age of Onset   Heart disease Father    Breast cancer Sister    Breast cancer Sister    Diabetes Brother     Social History   Occupational History   Not on file  Tobacco Use   Smoking status: Never   Smokeless tobacco: Former    Types: Chew  Substance and Sexual Activity   Alcohol use: Not  Currently   Drug use: Never   Sexual activity: Not on file     Physical Exam: Blood pressure 98/60, pulse 80, temperature 98.4 F (36.9 C), temperature source Oral, height 5\' 10"  (1.778 m), weight 159 lb 12.8 oz (72.5 kg), SpO2 96 %.  Gen:      No acute distress ENT:  no nasal polyps, mucus membranes moist Lungs:    No increased respiratory effort, symmetric chest wall excursion, clear to auscultation bilaterally, no wheezes or crackles CV:         Regular rate and rhythm; no murmurs, rubs, or gallops.  No pedal edema   Data Reviewed: Imaging: I have personally reviewed the PET CT done at the [facility] which shows minimally pet avid pulmonary nodule in the right lung base approx 2.1 cm. His prostate is pet avid concerning for primary prostate malignancy.   PFTs: No flowsheet data found.   Labs:  Immunization status: Immunization History  Administered Date(s) Administered   Fluad Quad(high Dose 65+) 07/07/2019   Influenza, High Dose Seasonal PF 09/03/2015   Influenza,inj,Quad PF,6+ Mos 06/28/2013, 06/18/2017   PFIZER Comirnaty(Gray Top)Covid-19 Tri-Sucrose Vaccine 03/25/2021   PFIZER(Purple Top)SARS-COV-2 Vaccination 10/27/2019, 11/17/2019, 07/16/2020   Pneumococcal Conjugate-13 12/12/2016  Pneumococcal-Unspecified 07/05/2015    [facility]  Assessment:  Pulmonary nodule, right lower lung, 2.1 cm SUV 1.9 in July 2022 PET avid lymphadenopathy Calcified Granulomas Possible Prostate Cancer  Plan/Recommendations: The nodule noted on his PET CT was done back in July 2022. It was minimally pet avid at that time. He additionally has some pet avid paratracheal lymphadenopathy SUV 6.1, but this is accompanied with signs of calcified granulomatous disease.   He has had progressive weight loss and more recently cough.  I am ordering a repeat PET CT in the setting of his progressive symptoms. His wife and daughter are  here with him today. Today his wife asked if it was safe for him to undergo any procedures for diagnosis. I asked them to discuss as a family if treatment for cancer is something he would desire. The patient had a twin brother who developed cancer in his hip and decided against treatment before he died.  Differential includes sarcoidosis, less likely metastatic prostate cancer, less likely de novo new primary lung malignancy.   I spent 30 minutes on 11/05/2021 in care of this patient including face to face time and non-face to face time spent charting, review of outside records, and coordination of care.   Return to Care: I will contact them with next steps following repeat PET scan.   Lenice Llamas, MD Pulmonary and [place]

## 2021-11-14 ENCOUNTER — Ambulatory Visit (HOSPITAL_COMMUNITY)
Admission: RE | Admit: 2021-11-14 | Discharge: 2021-11-14 | Disposition: A | Discharge status: Home / Self Care | Payer: No Typology Code available for payment source | Source: Ambulatory Visit | Attending: Internal Medicine | Admitting: Internal Medicine

## 2021-11-14 ENCOUNTER — Encounter (HOSPITAL_COMMUNITY)
Admission: RE | Admit: 2021-11-14 | Discharge: 2021-11-14 | Disposition: A | Discharge status: Home / Self Care | Payer: No Typology Code available for payment source | Source: Ambulatory Visit | Attending: Internal Medicine | Admitting: Internal Medicine

## 2021-11-14 ENCOUNTER — Other Ambulatory Visit: Payer: Self-pay

## 2021-11-14 DIAGNOSIS — R911 Solitary pulmonary nodule: Secondary | ICD-10-CM | POA: Diagnosis not present

## 2021-11-14 LAB — GLUCOSE, CAPILLARY: Glucose-Capillary: 256 mg/dL — ABNORMAL HIGH (ref 70–99)

## 2021-11-14 MED ORDER — FLUDEOXYGLUCOSE F - 18 (FDG) INJECTION
7.9200 | Freq: Once | INTRAVENOUS | Status: AC
  Administered 2021-11-14: 7.92 via INTRAVENOUS

## 2021-11-15 ENCOUNTER — Telehealth: Payer: Self-pay | Admitting: Internal Medicine

## 2021-11-15 NOTE — Telephone Encounter (Signed)
6 month recall placed. Nothing further needed at this time.

## 2021-11-15 NOTE — Telephone Encounter (Signed)
Called patient's daughter to review results of PET scan. Given patient's goals and relatively small slow growing nodule, would manage conservatively for now. If this is malignancy, it is likely very slow growing. Will follow up in 6 months and see how thing are going, potentially with repeat imaging.

## 2022-01-29 ENCOUNTER — Ambulatory Visit: Payer: Medicare HMO | Admitting: Podiatry

## 2022-01-29 ENCOUNTER — Encounter: Payer: Self-pay | Admitting: Podiatry

## 2022-01-29 DIAGNOSIS — L853 Xerosis cutis: Secondary | ICD-10-CM | POA: Diagnosis not present

## 2022-01-29 DIAGNOSIS — M2042 Other hammer toe(s) (acquired), left foot: Secondary | ICD-10-CM | POA: Diagnosis not present

## 2022-01-29 DIAGNOSIS — E1151 Type 2 diabetes mellitus with diabetic peripheral angiopathy without gangrene: Secondary | ICD-10-CM

## 2022-01-29 DIAGNOSIS — M2041 Other hammer toe(s) (acquired), right foot: Secondary | ICD-10-CM | POA: Diagnosis not present

## 2022-01-29 DIAGNOSIS — B351 Tinea unguium: Secondary | ICD-10-CM | POA: Diagnosis not present

## 2022-01-29 DIAGNOSIS — M79675 Pain in left toe(s): Secondary | ICD-10-CM

## 2022-01-29 DIAGNOSIS — M79674 Pain in right toe(s): Secondary | ICD-10-CM

## 2022-01-29 DIAGNOSIS — E119 Type 2 diabetes mellitus without complications: Secondary | ICD-10-CM

## 2022-01-29 NOTE — Progress Notes (Signed)
ANNUAL DIABETIC FOOT EXAM ? ?Subjective: ?Ryan Gilmore presents today for annual diabetic foot examination. ? ?Patient relates 20-30 year h/o diabetes related by wife and daughter. ? ?Patient denies any h/o foot wounds. ? ?Last HgA1c was 7.1% Patient did not check blood glucose this morning. ? ?Risk factors:  h/o DVT, h/o PE, diabetes. ? ?Ryan Frizzle, MD is patient's PCP. Last visit was April 12, 2021. ? ?Past Medical History:  ?Diagnosis Date  ? Bleeding duodenal ulcer   ? ?  1980s  ? Dementia ([place])   ? Diabetes mellitus without complication ([place])   ? Hyperlipidemia   ? ?Patient Active Problem List  ? Diagnosis Date Noted  ? DVT, recurrent, lower extremity, chronic, bilateral   ? Hypomagnesemia   ? Palliative care by specialist   ? DNR (do not resuscitate) discussion   ? Acute hypoxemic respiratory failure ([place])   ? Acute on chronic respiratory failure with hypoxia (HCC)   ? Vascular dementia without behavioral disturbance ([place])   ? FTT (failure to thrive) in adult   ? Acute pulmonary embolism without acute cor pulmonale ([place]) 04/26/2019  ? Essential hypertension 04/26/2019  ? AKI (acute kidney injury) ([place]) 04/26/2019  ? Chronic midline low back pain without sciatica 10/12/2017  ? Diabetes mellitus type 2, noninsulin dependent ([place]) 06/28/2013  ? Other and unspecified hyperlipidemia 06/28/2013  ? ?Past Surgical History:  ?Procedure Laterality Date  ? APPENDECTOMY    ? ?Current Outpatient Medications on File Prior to Visit  ?Medication Sig Dispense Refill  ? aspirin EC 81 MG tablet Take 81 mg by mouth daily. Swallow whole.    ? Blood Glucose Monitoring Suppl (BLOOD GLUCOSE SYSTEM PAK) KIT Please dispense based on patient and insurance preference. Use as directed to monitor FSBS 2x. Dx: E11.E11.65. 1 kit 1  ? Glimepiride (AMARYL PO) Take by mouth.    ? Glucose Blood (BLOOD GLUCOSE TEST STRIPS) STRP Please dispense one touch verio flex lancets, and testing strips. Use as directed to monitor FSBS 2x. Dx:  E11.E11.65. 100 strip 11  ? Lancets MISC Please dispense based on patient and insurance preference. Use as directed to monitor FSBS 2x. Dx: E11.E11.65. 100 each 3  ? losartan (COZAAR) 25 MG tablet Take 25 mg by mouth daily.    ? Memantine HCl (NAMENDA PO) Take by mouth.    ? metFORMIN (GLUCOPHAGE) 500 MG tablet TAKE 2 TABLETS BY MOUTH TWICE DAILY WITH A MEAL 360 tablet 1  ? Multiple Vitamin (MULTIVITAMIN) tablet Take 1 tablet by mouth daily.    ? pioglitazone (ACTOS) 30 MG tablet Take 1 tablet (30 mg total) by mouth daily. 90 tablet 1  ? ?No current facility-administered medications on file prior to visit.  ?  ?No Known Allergies ?Social History  ? ?Occupational History  ? Not on file  ?Tobacco Use  ? Smoking status: Never  ? Smokeless tobacco: Former  ?  Types: Chew  ?Substance and Sexual Activity  ? Alcohol use: Not Currently  ? Drug use: Never  ? Sexual activity: Not on file  ? ?Family History  ?Problem Relation Age of Onset  ? Heart disease Father   ? Breast cancer Sister   ? Breast cancer Sister   ? Diabetes Brother   ? ?Immunization History  ?Administered Date(s) Administered  ? Fluad Quad(high Dose 65+) 07/07/2019  ? Influenza, High Dose Seasonal PF 09/03/2015  ? Influenza,inj,Quad PF,6+ Mos 06/28/2013, 06/18/2017  ? PFIZER Comirnaty(Gray Top)Covid-19 Tri-Sucrose Vaccine 03/25/2021  ? PFIZER(Purple Top)SARS-COV-2 Vaccination  10/27/2019, 11/17/2019, 07/16/2020  ? Pneumococcal Conjugate-13 12/12/2016  ? Pneumococcal-Unspecified 07/05/2015  ?  ? ?Review of Systems: Negative except as noted in the HPI.  ? ?Objective: ?There were no vitals filed for this visit. ? ?Ryan Gilmore is a pleasant 88 y.o. male in NAD. AAO X 3. ? ?Vascular Examination: ?CFT <3 seconds b/l LE. Faintly palpable DP pulses b/l LE. Diminished PT pulse(s) b/l LE. Pedal hair absent. No pain with calf compression b/l. Lower extremity skin temperature gradient within normal limits. No edema noted b/l LE. No ischemia or gangrene noted b/l LE. No  cyanosis or clubbing noted b/l LE. ? ?Dermatological Examination: ?No open wounds b/l LE. No interdigital macerations noted b/l LE. Toenails 1-5 b/l elongated, discolored, dystrophic, thickened, crumbly with subungual debris and tenderness to dorsal palpation. No hyperkeratotic nor porokeratotic lesions present on today's visit. Pedal skin noted to be dry b/l lower extremities. ? ?Neurological Examination: ?Protective sensation intact 5/5 intact bilaterally with 10g monofilament b/l. Vibratory sensation intact b/l. ? ?Musculoskeletal Examination: ?Muscle strength 5/5 to all lower extremity muscle groups bilaterally. No pain, crepitus or joint limitation noted with ROM bilateral LE. Hammertoe deformity noted 2-5 b/l. Utilizes rollator for ambulation assistance. ? ?Footwear Assessment: ?Does the patient wear appropriate shoes? Yes. ?Does the patient need inserts/orthotics? No. ? ? ?  Latest Ref Rng & Units 04/12/2021  ?  3:07 PM  ?Hemoglobin A1C  ?Hemoglobin-A1c <5.7 % of total Hgb 7.0    ? ?Assessment: ?1. Pain due to onychomycosis of toenails of both feet   ?2. Acquired hammertoes of both feet   ?3. Xerosis cutis   ?4. Type II diabetes mellitus with peripheral circulatory disorder (HCC)   ?5. Encounter for diabetic foot exam ([place])   ?  ?ADA Risk Categorization: ?High Risk  ?Patient has one or more of the following: ?Loss of protective sensation ?Absent pedal pulses ?Severe Foot deformity ?History of foot ulcer ? ?Plan: ?-Patient was evaluated and treated. All patient's and/or POA's questions/concerns answered on today's visit. ?-Diabetic foot examination performed today. ?-Continue foot and shoe inspections daily. Monitor blood glucose per PCP/Endocrinologist's recommendations. ?-Mycotic toenails 1-5 bilaterally were debrided in length and girth with sterile nail nippers and dremel without incident. ?-For dry skin, patient to apply CeraVe Healing Ointment to both feet once daily. ?-Patient/POA to call should there be  question/concern in the interim. ?Return in about 3 months (around 04/30/2022). ? ?Marzetta Board, DPM ?

## 2022-02-03 ENCOUNTER — Other Ambulatory Visit: Payer: Self-pay

## 2022-02-03 ENCOUNTER — Ambulatory Visit
Admission: RE | Admit: 2022-02-03 | Discharge: 2022-02-03 | Disposition: A | Discharge status: Home / Self Care | Payer: Self-pay | Source: Ambulatory Visit | Attending: Internal Medicine | Admitting: Internal Medicine

## 2022-02-03 DIAGNOSIS — R911 Solitary pulmonary nodule: Secondary | ICD-10-CM

## 2022-02-07 DIAGNOSIS — R972 Elevated prostate specific antigen [PSA]: Secondary | ICD-10-CM | POA: Diagnosis not present

## 2022-02-14 DIAGNOSIS — R8271 Bacteriuria: Secondary | ICD-10-CM | POA: Diagnosis not present

## 2022-02-14 DIAGNOSIS — R972 Elevated prostate specific antigen [PSA]: Secondary | ICD-10-CM | POA: Diagnosis not present

## 2022-02-14 DIAGNOSIS — N400 Enlarged prostate without lower urinary tract symptoms: Secondary | ICD-10-CM | POA: Diagnosis not present

## 2022-05-07 ENCOUNTER — Ambulatory Visit: Payer: Medicare HMO | Admitting: Podiatry

## 2022-05-07 ENCOUNTER — Encounter: Payer: Self-pay | Admitting: Podiatry

## 2022-05-07 DIAGNOSIS — E1151 Type 2 diabetes mellitus with diabetic peripheral angiopathy without gangrene: Secondary | ICD-10-CM | POA: Diagnosis not present

## 2022-05-07 DIAGNOSIS — M79674 Pain in right toe(s): Secondary | ICD-10-CM | POA: Diagnosis not present

## 2022-05-07 DIAGNOSIS — B351 Tinea unguium: Secondary | ICD-10-CM

## 2022-05-07 DIAGNOSIS — M79675 Pain in left toe(s): Secondary | ICD-10-CM | POA: Diagnosis not present

## 2022-05-12 NOTE — Progress Notes (Signed)
  Subjective:  Patient ID: Ryan Gilmore, male    DOB: 1932-11-08,  MRN: [id number]  Ryan Gilmore presents to clinic today for at risk foot care. Pt has h/o NIDDM with PAD and painful elongated mycotic toenails 1-5 bilaterally which are tender when wearing enclosed shoe gear. Pain is relieved with periodic professional debridement.  Last A1c was 9.1%.  Patient did not check blood glucose today.  He is accompanied by his daughter and wife on today's visit.   New problem(s): None.   PCP is [name], MD , and last visit was  April 12, 2021  No Known Allergies  Review of Systems: Negative except as noted in the HPI.  Objective: No changes noted in today's physical examination. There were no vitals filed for this visit.  Ryan Gilmore is a pleasant 88 y.o. male in NAD. AAO X 3.  Vascular Examination: CFT <3 seconds b/l LE. Faintly palpable DP pulses b/l LE. Diminished PT pulse(s) b/l LE. Pedal hair absent. No pain with calf compression b/l. Lower extremity skin temperature gradient within normal limits. No edema noted b/l LE. No ischemia or gangrene noted b/l LE. No cyanosis or clubbing noted b/l LE.  Dermatological Examination: No open wounds b/l LE. No interdigital macerations noted b/l LE. Toenails 1-5 b/l elongated, discolored, dystrophic, thickened, crumbly with subungual debris and tenderness to dorsal palpation. No hyperkeratotic nor porokeratotic lesions present on today's visit. Pedal skin noted to be dry b/l lower extremities.  Neurological Examination: Protective sensation intact 5/5 intact bilaterally with 10g monofilament b/l. Vibratory sensation intact b/l.  Musculoskeletal Examination: Muscle strength 5/5 to all lower extremity muscle groups bilaterally. No pain, crepitus or joint limitation noted with ROM bilateral LE. Hammertoe deformity noted 2-5 b/l. Utilizes rollator for ambulation assistance.  Assessment/Plan: 1. Pain due to onychomycosis of toenails of  both feet   2. Type II diabetes mellitus with peripheral circulatory disorder (HCC)     -Examined patient. -Continue CeraVe Ointment to feet once daily. -Patient to continue soft, supportive shoe gear daily. -Toenails 1-5 b/l were debrided in length and girth with sterile nail nippers and dremel without iatrogenic bleeding.  -Patient/POA to call should there be question/concern in the interim.   Return in about 3 months (around 08/07/2022).  Marzetta Board, DPM

## 2022-05-14 ENCOUNTER — Ambulatory Visit: Payer: No Typology Code available for payment source | Admitting: Emergency Medicine

## 2022-05-16 ENCOUNTER — Ambulatory Visit: Payer: No Typology Code available for payment source | Admitting: Internal Medicine

## 2022-05-16 DIAGNOSIS — E113292 Type 2 diabetes mellitus with mild nonproliferative diabetic retinopathy without macular edema, left eye: Secondary | ICD-10-CM | POA: Diagnosis not present

## 2022-05-16 DIAGNOSIS — H40013 Open angle with borderline findings, low risk, bilateral: Secondary | ICD-10-CM | POA: Diagnosis not present

## 2022-05-16 DIAGNOSIS — H25813 Combined forms of age-related cataract, bilateral: Secondary | ICD-10-CM | POA: Diagnosis not present

## 2022-05-16 DIAGNOSIS — H524 Presbyopia: Secondary | ICD-10-CM | POA: Diagnosis not present

## 2022-05-16 LAB — HM DIABETES EYE EXAM

## 2022-05-20 LAB — HM DIABETES EYE EXAM

## 2022-05-23 ENCOUNTER — Encounter: Payer: Self-pay | Admitting: Internal Medicine

## 2022-05-23 ENCOUNTER — Ambulatory Visit (INDEPENDENT_AMBULATORY_CARE_PROVIDER_SITE_OTHER): Payer: No Typology Code available for payment source | Admitting: Internal Medicine

## 2022-05-23 VITALS — BP 110/70 | HR 81 | Ht 71.0 in | Wt 151.8 lb

## 2022-05-23 DIAGNOSIS — R911 Solitary pulmonary nodule: Secondary | ICD-10-CM | POA: Diagnosis not present

## 2022-05-23 NOTE — Patient Instructions (Addendum)
Please get your PET scan our office will call and schedule this for you.   I will call you with results and next steps if any follow up is needed.

## 2022-05-23 NOTE — Progress Notes (Signed)
Ryan Gilmore    [id number]    1933-05-21  Primary Care Physician:Pickard, Cammie [name], MD Date of Appointment: 05/23/2022 Established Patient Visit  Chief complaint:   Chief Complaint  Patient presents with   Follow-up    Follow-up     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   Interval Updates: Here for follow up for lung nodule.   He has a nodule 2.1 cm in the RLL which was seen on a ct scan initially in 2021. I have now reviewed the CT scan which shows that this RLL posterior lung base nodule is minimally pet avid SUV of 1.9 and he has evidence of calcified granulomas and some pet avid paratracheal lymphadenopathy (highest SUV 6.1)  Prostate cancer is being monitored with serial PSA.  He is feeling well,. Asymptomatic. No fevers chills night sweats or weight loss.    I have reviewed the patient's family social and past medical history and updated as appropriate.   Past Medical History:  Diagnosis Date   Bleeding duodenal ulcer    ?  1980s   Dementia ([place])    Diabetes mellitus without complication ([place])    Hyperlipidemia     Past Surgical History:  Procedure Laterality Date   APPENDECTOMY      Family History  Problem Relation Age of Onset   Heart disease Father    Breast cancer Sister    Breast cancer Sister    Diabetes Brother     Social History   Occupational History   Not on file  Tobacco Use   Smoking status: Never   Smokeless tobacco: Former    Types: Chew  Substance and Sexual Activity   Alcohol use: Not Currently   Drug use: Never   Sexual activity: Not on file     Physical Exam: Blood pressure 110/70, pulse 81, height '5\' 11"'$  (1.803 m), weight 151 lb 12.8 oz (68.9 kg), SpO2 97 %.  Gen:      No acute distress ENT:  no nasal polyps, mucus membranes moist Lungs:    ctab no wheezes or crackles CV:         RRR no mrg   Data  Reviewed: Imaging: I have personally reviewed the PET CT done at the [place] which shows minimally pet avid pulmonary nodule in the right lung base approx 2.1 cm. His prostate is pet avid concerning for primary prostate malignancy.   PFTs:      No data to display           Labs:  Immunization status: Immunization History  Administered Date(s) Administered   Fluad Quad(high Dose 65+) 07/07/2019   Influenza, High Dose Seasonal PF 09/03/2015   Influenza,inj,Quad PF,6+ Mos 06/28/2013, 06/18/2017   PFIZER Comirnaty(Gray Top)Covid-19 Tri-Sucrose Vaccine 03/25/2021   PFIZER(Purple Top)SARS-COV-2 Vaccination 10/27/2019, 11/17/2019, 07/16/2020   Pneumococcal Conjugate-13 12/12/2016   Pneumococcal-Unspecified 07/05/2015    [facility]  Assessment:  Pulmonary nodule, right lower lung, 1.3 cm SUV 1.9 in Feb 2023 PET avid lymphadenopathy Calcified Granulomas Possible Prostate Cancer, slow growing?  Plan/Recommendations: Differential includes granulomatous disaese, slow growing malignancy with low pet avidity.  Will plan to repeat a PET scan to nodule. It has actually decreased from 2.2cm to 1.3 cm in less than a year.  If stable or decreasing can  likely stop monitoring. Will plan to monitor for 2 years documented stability. First scanned in 2021 per outside records.     Return to Care: I will call his daughter with results of the scan and if further follow up is needed.     Lenice Llamas, MD Pulmonary and [place]

## 2022-05-27 ENCOUNTER — Encounter: Payer: Self-pay | Admitting: Family Medicine

## 2022-06-04 ENCOUNTER — Encounter (HOSPITAL_COMMUNITY)
Admission: RE | Admit: 2022-06-04 | Discharge: 2022-06-04 | Disposition: A | Discharge status: Home / Self Care | Payer: No Typology Code available for payment source | Source: Ambulatory Visit | Attending: Internal Medicine | Admitting: Internal Medicine

## 2022-06-04 DIAGNOSIS — R911 Solitary pulmonary nodule: Secondary | ICD-10-CM | POA: Diagnosis present

## 2022-06-04 LAB — GLUCOSE, CAPILLARY: Glucose-Capillary: 314 mg/dL — ABNORMAL HIGH (ref 70–99)

## 2022-06-04 MED ORDER — FLUDEOXYGLUCOSE F - 18 (FDG) INJECTION
7.5000 | Freq: Once | INTRAVENOUS | Status: DC | PRN
Start: 2022-06-04 — End: 2022-06-10

## 2022-06-11 ENCOUNTER — Encounter (HOSPITAL_COMMUNITY): Payer: Self-pay

## 2022-06-11 ENCOUNTER — Emergency Department (HOSPITAL_COMMUNITY)
Admission: EM | Admit: 2022-06-11 | Discharge: 2022-06-11 | Disposition: A | Discharge status: Home / Self Care | Payer: Medicare HMO | Attending: Emergency Medicine | Admitting: Emergency Medicine

## 2022-06-11 DIAGNOSIS — R0689 Other abnormalities of breathing: Secondary | ICD-10-CM | POA: Diagnosis not present

## 2022-06-11 DIAGNOSIS — Z7984 Long term (current) use of oral hypoglycemic drugs: Secondary | ICD-10-CM | POA: Diagnosis not present

## 2022-06-11 DIAGNOSIS — R739 Hyperglycemia, unspecified: Secondary | ICD-10-CM | POA: Diagnosis not present

## 2022-06-11 DIAGNOSIS — Z7982 Long term (current) use of aspirin: Secondary | ICD-10-CM | POA: Insufficient documentation

## 2022-06-11 DIAGNOSIS — I10 Essential (primary) hypertension: Secondary | ICD-10-CM | POA: Insufficient documentation

## 2022-06-11 DIAGNOSIS — Z794 Long term (current) use of insulin: Secondary | ICD-10-CM | POA: Diagnosis not present

## 2022-06-11 DIAGNOSIS — Z79899 Other long term (current) drug therapy: Secondary | ICD-10-CM | POA: Diagnosis not present

## 2022-06-11 DIAGNOSIS — E1165 Type 2 diabetes mellitus with hyperglycemia: Secondary | ICD-10-CM | POA: Diagnosis not present

## 2022-06-11 LAB — I-STAT CHEM 8, ED
BUN: 11 mg/dL (ref 8–23)
Calcium, Ion: 1.24 mmol/L (ref 1.15–1.40)
Chloride: 100 mmol/L (ref 98–111)
Creatinine, Ser: 0.8 mg/dL (ref 0.61–1.24)
Glucose, Bld: 475 mg/dL — ABNORMAL HIGH (ref 70–99)
HCT: 44 % (ref 39.0–52.0)
Hemoglobin: 15 g/dL (ref 13.0–17.0)
Potassium: 4.4 mmol/L (ref 3.5–5.1)
Sodium: 138 mmol/L (ref 135–145)
TCO2: 28 mmol/L (ref 22–32)

## 2022-06-11 LAB — CBG MONITORING, ED
Glucose-Capillary: 436 mg/dL — ABNORMAL HIGH (ref 70–99)
Glucose-Capillary: 370 mg/dL — ABNORMAL HIGH (ref 70–99)

## 2022-06-11 MED ORDER — SODIUM CHLORIDE 0.9 % IV BOLUS
500.0000 mL | Freq: Once | INTRAVENOUS | Status: AC
  Administered 2022-06-11: 500 mL via INTRAVENOUS

## 2022-06-11 MED ORDER — INSULIN ASPART 100 UNIT/ML IJ SOLN
8.0000 [IU] | Freq: Once | INTRAMUSCULAR | Status: AC
  Administered 2022-06-11: 8 [IU] via SUBCUTANEOUS
  Filled 2022-06-11: qty 0.08

## 2022-06-11 NOTE — ED Triage Notes (Signed)
Pt BIBA from fire station, wife check pt's cbg at home and meter read high so she took him to fire station, EMS called. 18ga LF, 1L [place]. CBG 550 down to 542  134/79 HR 89 RR 20 95% RA

## 2022-06-11 NOTE — ED Provider Notes (Signed)
[facility] DEPT Provider Note   CSN: [id number] Arrival date & time: 06/11/22  1341     History  Chief Complaint  Patient presents with   Hyperglycemia    Ryan Gilmore is a 88 y.o. male.  Patient has a history of diabetes and his sugar has been running high.  His sugar was in the 500s when the paramedics came  The history is provided by the patient. No language interpreter was used.  Hyperglycemia Severity:  Moderate Onset quality:  Sudden Timing:  Intermittent Progression:  Waxing and waning Chronicity:  New Diabetes status:  Controlled with oral medications Context: not change in medication   Relieved by:  Nothing Ineffective treatments:  None tried Associated symptoms: no abdominal pain, no chest pain and no fatigue        Home Medications Prior to Admission medications   Medication Sig Start Date End Date Taking? Authorizing Provider  aspirin EC 81 MG tablet Take 81 mg by mouth daily. Swallow whole.    [provider]  Blood Glucose Monitoring Suppl (BLOOD GLUCOSE SYSTEM PAK) KIT Please dispense based on patient and insurance preference. Use as directed to monitor FSBS 2x. Dx: E11.E11.65. 03/24/19   [name], MD  Glimepiride (AMARYL PO) Take by mouth.    [provider]  Glucose Blood (BLOOD GLUCOSE TEST STRIPS) STRP Please dispense one touch verio flex lancets, and testing strips. Use as directed to monitor FSBS 2x. Dx: E11.E11.65. 03/30/19   [name], MD  Lancets MISC Please dispense based on patient and insurance preference. Use as directed to monitor FSBS 2x. Dx: E11.E11.65. 03/24/19   [name], MD  losartan (COZAAR) 25 MG tablet Take 25 mg by mouth daily.    [provider]  Memantine HCl (NAMENDA PO) Take by mouth.    [provider]  metFORMIN (GLUCOPHAGE) 500 MG tablet TAKE 2 TABLETS BY MOUTH TWICE DAILY WITH A MEAL 05/09/19   [name], MD  Multiple Vitamin  (MULTIVITAMIN) tablet Take 1 tablet by mouth daily.    [provider]  pioglitazone (ACTOS) 30 MG tablet Take 1 tablet (30 mg total) by mouth daily. 04/17/21   [name], MD      Allergies    Patient has no known allergies.    Review of Systems   Review of Systems  Constitutional:  Negative for appetite change and fatigue.  HENT:  Negative for congestion, ear discharge and sinus pressure.   Eyes:  Negative for discharge.  Respiratory:  Negative for cough.   Cardiovascular:  Negative for chest pain.  Gastrointestinal:  Negative for abdominal pain and diarrhea.  Genitourinary:  Negative for frequency and hematuria.  Musculoskeletal:  Negative for back pain.  Skin:  Negative for rash.  Neurological:  Negative for seizures and headaches.  Psychiatric/Behavioral:  Negative for hallucinations.     Physical Exam Updated Vital Signs BP 129/87   Pulse 84   Temp 98.3 F (36.8 C) (Oral)   Resp 17   SpO2 95%  Physical Exam Vitals and nursing note reviewed.  Constitutional:      Appearance: He is well-developed.  HENT:     Head: Normocephalic.     Nose: Nose normal.  Eyes:     General: No scleral icterus.    Conjunctiva/sclera: Conjunctivae normal.  Neck:     Thyroid: No thyromegaly.  Cardiovascular:     Rate and Rhythm: Normal rate and regular rhythm.  Heart sounds: No murmur heard.    No friction rub. No gallop.  Pulmonary:     Breath sounds: No stridor. No wheezing or rales.  Chest:     Chest wall: No tenderness.  Abdominal:     General: There is no distension.     Tenderness: There is no abdominal tenderness. There is no rebound.  Musculoskeletal:        General: Normal range of motion.     Cervical back: Neck supple.  Lymphadenopathy:     Cervical: No cervical adenopathy.  Skin:    Findings: No erythema or rash.  Neurological:     Mental Status: He is alert and oriented to person, place, and time.     Motor: No abnormal muscle tone.      Coordination: Coordination normal.  Psychiatric:        Behavior: Behavior normal.     ED Results / Procedures / Treatments   Labs (all labs ordered are listed, but only abnormal results are displayed) Labs Reviewed  I-STAT CHEM 8, ED - Abnormal; Notable for the following components:      Result Value   Glucose, Bld 475 (*)    All other components within normal limits  CBG MONITORING, ED - Abnormal; Notable for the following components:   Glucose-Capillary 436 (*)    All other components within normal limits  CBG MONITORING, ED - Abnormal; Notable for the following components:   Glucose-Capillary 370 (*)    All other components within normal limits    EKG None  Radiology No results found.  Procedures Procedures    Medications Ordered in ED Medications  sodium chloride 0.9 % bolus 500 mL (0 mLs Intravenous Stopped 06/11/22 1451)  insulin aspart (novoLOG) injection 8 Units (8 Units Subcutaneous Given 06/11/22 1417)    ED Course/ Medical Decision Making/ A&P                           Medical Decision Making Risk Prescription drug management.  This patient presents to the ED for concern of elevated sugar, this involves an extensive number of treatment options, and is a complaint that carries with it a high risk of complications and morbidity.  The differential diagnosis includes poorly controlled diabetes   Co morbidities that complicate the patient evaluation  Hypertension   Additional history obtained:  Additional history obtained from wife External records from outside source obtained and reviewed including hospital records   Lab Tests:  I Ordered, and personally interpreted labs.  The pertinent results include: Initial glucose 475   Imaging Studies ordered: No imaging Cardiac Monitoring: / EKG:  The patient was maintained on a cardiac monitor.  I personally viewed and interpreted the cardiac monitored which showed an underlying rhythm of: Normal sinus  rhythm   Consultations Obtained:  No consult  Problem List / ED Course / Critical interventions / Medication management  Hypertension diabetes I ordered medication including insulin and saline Reevaluation of the patient after these medicines showed that the patient improved I have reviewed the patients home medicines and have made adjustments as needed   Social Determinants of Health:  Patient with mild dementia   Test / Admission - Considered:  No additional test needed  Hyperglycemia, patient has been treated with insulin and saline and his glucose has improved he will follow-up with his doctor        Final Clinical Impression(s) / ED Diagnoses Final diagnoses:  Hyperglycemia  Rx / DC Orders ED Discharge Orders     None         Milton Ferguson, MD 06/13/22 [phone]

## 2022-06-11 NOTE — Discharge Instructions (Signed)
Follow-up with your family doctor later this week to see how your sugars are still in

## 2022-06-12 DIAGNOSIS — N3001 Acute cystitis with hematuria: Secondary | ICD-10-CM | POA: Diagnosis not present

## 2022-06-12 DIAGNOSIS — F0390 Unspecified dementia without behavioral disturbance: Secondary | ICD-10-CM | POA: Diagnosis not present

## 2022-06-12 DIAGNOSIS — N3000 Acute cystitis without hematuria: Secondary | ICD-10-CM | POA: Diagnosis not present

## 2022-06-12 DIAGNOSIS — R8271 Bacteriuria: Secondary | ICD-10-CM | POA: Diagnosis not present

## 2022-06-12 DIAGNOSIS — C8590 Non-Hodgkin lymphoma, unspecified, unspecified site: Secondary | ICD-10-CM | POA: Insufficient documentation

## 2022-06-12 DIAGNOSIS — E1165 Type 2 diabetes mellitus with hyperglycemia: Secondary | ICD-10-CM | POA: Insufficient documentation

## 2022-06-12 DIAGNOSIS — C61 Malignant neoplasm of prostate: Secondary | ICD-10-CM | POA: Insufficient documentation

## 2022-06-12 DIAGNOSIS — Z91148 Patient's other noncompliance with medication regimen for other reason: Secondary | ICD-10-CM | POA: Diagnosis not present

## 2022-06-12 DIAGNOSIS — E872 Acidosis, unspecified: Secondary | ICD-10-CM | POA: Diagnosis not present

## 2022-06-12 DIAGNOSIS — B952 Enterococcus as the cause of diseases classified elsewhere: Secondary | ICD-10-CM | POA: Insufficient documentation

## 2022-06-12 DIAGNOSIS — Z7901 Long term (current) use of anticoagulants: Secondary | ICD-10-CM | POA: Diagnosis not present

## 2022-06-12 DIAGNOSIS — Z66 Do not resuscitate: Secondary | ICD-10-CM | POA: Diagnosis not present

## 2022-06-12 DIAGNOSIS — N390 Urinary tract infection, site not specified: Secondary | ICD-10-CM | POA: Insufficient documentation

## 2022-06-12 DIAGNOSIS — D72829 Elevated white blood cell count, unspecified: Secondary | ICD-10-CM | POA: Diagnosis not present

## 2022-06-12 DIAGNOSIS — Z8546 Personal history of malignant neoplasm of prostate: Secondary | ICD-10-CM | POA: Diagnosis not present

## 2022-06-12 DIAGNOSIS — R918 Other nonspecific abnormal finding of lung field: Secondary | ICD-10-CM | POA: Diagnosis not present

## 2022-06-12 DIAGNOSIS — R531 Weakness: Secondary | ICD-10-CM | POA: Diagnosis not present

## 2022-06-13 DIAGNOSIS — C8590 Non-Hodgkin lymphoma, unspecified, unspecified site: Secondary | ICD-10-CM | POA: Diagnosis not present

## 2022-06-13 DIAGNOSIS — E1165 Type 2 diabetes mellitus with hyperglycemia: Secondary | ICD-10-CM | POA: Diagnosis not present

## 2022-06-13 DIAGNOSIS — N3000 Acute cystitis without hematuria: Secondary | ICD-10-CM | POA: Diagnosis not present

## 2022-06-14 DIAGNOSIS — N3000 Acute cystitis without hematuria: Secondary | ICD-10-CM | POA: Diagnosis not present

## 2022-06-14 DIAGNOSIS — E1165 Type 2 diabetes mellitus with hyperglycemia: Secondary | ICD-10-CM | POA: Diagnosis not present

## 2022-06-14 DIAGNOSIS — C8590 Non-Hodgkin lymphoma, unspecified, unspecified site: Secondary | ICD-10-CM | POA: Diagnosis not present

## 2022-06-17 ENCOUNTER — Ambulatory Visit (INDEPENDENT_AMBULATORY_CARE_PROVIDER_SITE_OTHER): Payer: No Typology Code available for payment source | Admitting: Family Medicine

## 2022-06-17 VITALS — BP 118/68 | HR 83 | Temp 98.6°F | Ht 71.0 in | Wt 156.0 lb

## 2022-06-17 DIAGNOSIS — E1165 Type 2 diabetes mellitus with hyperglycemia: Secondary | ICD-10-CM | POA: Diagnosis not present

## 2022-06-17 NOTE — Progress Notes (Signed)
Subjective:    Patient ID: Ryan Gilmore, male    DOB: 05/13/1933, 88 y.o.   MRN: [id number]  HPI Patient is here today with his wife.  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  Wife has mild to moderate dementia.  Daughter is unable to be here today.  Wife is uncertain of the medications he is taking.  He gets his medicines from the [place].  Medication list includes metformin and pioglitazone.  Also includes glimepiride but we do not even know what dose he is taking.  We have not refilled it in over a year.  Therefore I am not certain that he is actually taking his medications.  Recently had to go to the emergency room with a blood sugar over 400.  He continues to lose weight.  Today he is essentially noncommunicative although he smiles politely when I talk to him.  He was at the emergency room 5 days ago.  When I performed a physical exam today, the patient still has the EKG electrodes on from the emergency room.   Past Medical History:  Diagnosis Date   Bleeding duodenal ulcer    ?  1980s   Dementia ([place])    Diabetes mellitus without complication ([place])    Hyperlipidemia    Past Surgical History:  Procedure Laterality Date   APPENDECTOMY     Current Outpatient Medications on File Prior to Visit  Medication Sig Dispense Refill   aspirin EC 81 MG tablet Take 81 mg by mouth daily. Swallow whole.     Blood Glucose Monitoring Suppl (BLOOD GLUCOSE SYSTEM PAK) KIT Please dispense based on patient and insurance preference. Use as directed to monitor FSBS 2x. Dx: E11.E11.65. 1 kit 1   Glimepiride (AMARYL PO) Take by mouth.     Glucose Blood (BLOOD GLUCOSE TEST STRIPS) STRP Please dispense one touch verio flex lancets, and testing strips. Use as directed to monitor FSBS 2x. Dx: E11.E11.65. 100 strip 11   Lancets MISC Please dispense based on patient and insurance preference. Use as directed to monitor FSBS 2x. Dx: E11.E11.65. 100 each 3   losartan (COZAAR) 25 MG tablet Take 25 mg by mouth daily.      Memantine HCl (NAMENDA PO) Take by mouth.     metFORMIN (GLUCOPHAGE) 500 MG tablet TAKE 2 TABLETS BY MOUTH TWICE DAILY WITH A MEAL 360 tablet 1   Multiple Vitamin (MULTIVITAMIN) tablet Take 1 tablet by mouth daily.     pioglitazone (ACTOS) 30 MG tablet Take 1 tablet (30 mg total) by mouth daily. 90 tablet 1   No current facility-administered medications on file prior to visit.   No Known Allergies Social History   Socioeconomic History   Marital status: Married    Spouse name: Not on file   Number of children: Not on file   Years of education: Not on file   Highest education level: Not on file  Occupational History   Not on file  Tobacco Use   Smoking status: Never   Smokeless tobacco: Former    Types: Chew  Substance and Sexual Activity   Alcohol use: Not Currently   Drug use: Never   Sexual activity: Not on file  Other Topics Concern   Not on file  Social History Narrative   Not on file   Social Determinants of Health   Financial Resource Strain: Low Risk  (07/05/2021)   Overall Financial Resource Strain (CARDIA)    Difficulty of Paying Living Expenses: Not hard at all  Food Insecurity: No Food Insecurity (07/05/2021)   Hunger Vital Sign    Worried About Running Out of Food in the Last Year: Never true    Ran Out of Food in the Last Year: Never true  Transportation Needs: No Transportation Needs (07/05/2021)   PRAPARE - Hydrologist (Medical): No    Lack of Transportation (Non-Medical): No  Physical Activity: Insufficiently Active (07/05/2021)   Exercise Vital Sign    Days of Exercise per Week: 5 days    Minutes of Exercise per Session: 20 min  Stress: No Stress Concern Present (07/05/2021)   [place]    Feeling of Stress : Not at all  Social Connections: Moderately Isolated (07/05/2021)   Social Connection and Isolation Panel [NHANES]    Frequency of Communication with  Friends and Family: More than three times a week    Frequency of Social Gatherings with Friends and Family: More than three times a week    Attends Religious Services: Never    Marine scientist or Organizations: No    Attends Archivist Meetings: Never    Marital Status: Married  Human resources officer Violence: Not At Risk (07/05/2021)   Humiliation, Afraid, Rape, and Kick questionnaire    Fear of Current or Ex-Partner: No    Emotionally Abused: No    Physically Abused: No    Sexually Abused: No     Review of Systems     Objective:   Physical Exam Vitals reviewed.  Constitutional:      Appearance: He is underweight. He is not ill-appearing or toxic-appearing.  HENT:     Mouth/Throat:     Mouth: Mucous membranes are moist.  Eyes:     Extraocular Movements: Extraocular movements intact.     Conjunctiva/sclera: Conjunctivae normal.     Pupils: Pupils are equal, round, and reactive to light.  Neck:     Vascular: No carotid bruit.  Cardiovascular:     Rate and Rhythm: Normal rate and regular rhythm.     Pulses: Normal pulses.     Heart sounds: Normal heart sounds. No murmur heard.    No friction rub. No gallop.  Pulmonary:     Effort: Pulmonary effort is normal. No respiratory distress.     Breath sounds: Normal breath sounds. No stridor. No wheezing, rhonchi or rales.  Abdominal:     General: Abdomen is flat. Bowel sounds are normal. There is no distension.     Palpations: Abdomen is soft.     Tenderness: There is no abdominal tenderness.  Musculoskeletal:     Cervical back: Neck supple.  Skin:    Findings: No bruising.  Neurological:     General: No focal deficit present.     Mental Status: Mental status is at baseline. He is disoriented.     Cranial Nerves: No cranial nerve deficit.     Sensory: No sensory deficit.     Motor: No weakness.  Psychiatric:        Mood and Affect: Mood normal.           Assessment & Plan:   Hyperglycemia due to  diabetes mellitus ([place]) - Plan: Hemoglobin L2G, BASIC METABOLIC PANEL WITH GFR Repeat lab work today including CBC and a BMP.  Due to the fact the patient has had EKG electrodes on his chest for the last 5 days, I am concerned that neither he or his wife are able  to care for himself properly due to their dementia.  Therefore I am concerned that they are not taking medications properly.  I will ask the wife to go home his medication to see exactly what he is taking.  I suspect that he has not been taking 1 or 2 of his medications.  If he has been taking his medicine, we will need to start insulin.  If he needs to be on insulin, I believe that the patient's daughter needs to consider placement in an assisted living facility as I am concerned that the mother will not be able to do this

## 2022-06-18 ENCOUNTER — Telehealth: Payer: Self-pay

## 2022-06-18 LAB — BASIC METABOLIC PANEL WITH GFR
BUN: 10 mg/dL (ref 7–25)
CO2: 29 mmol/L (ref 20–32)
Calcium: 9.6 mg/dL (ref 8.6–10.3)
Chloride: 100 mmol/L (ref 98–110)
Creat: 0.93 mg/dL (ref 0.70–1.22)
Glucose, Bld: 457 mg/dL — ABNORMAL HIGH (ref 65–99)
Potassium: 4.9 mmol/L (ref 3.5–5.3)
Sodium: 138 mmol/L (ref 135–146)
eGFR: 79 mL/min/{1.73_m2} (ref >=60)

## 2022-06-18 LAB — HEMOGLOBIN A1C
Hgb A1c MFr Bld: 9.4 % of total Hgb — ABNORMAL HIGH (ref <5.7)
Mean Plasma Glucose: 223 mg/dL
eAG (mmol/L): 12.4 mmol/L

## 2022-06-18 NOTE — Telephone Encounter (Signed)
Call report from [facility], pt's glucose was 457. Thank you.

## 2022-06-19 ENCOUNTER — Telehealth: Payer: Self-pay

## 2022-06-19 NOTE — Telephone Encounter (Signed)
Pt's daughter, Alfonzo Feller, came by the office with pt's medication list. Pt takes Empagliflosin 25 mg BID. Cam states that pt is supposed to  take the Empagliflosin at 8 and at 12:30 am prior to eating. Cam states the patient has been missing the 12:30 dose and she thinks that, along with his diet is why his blood sugars have been so elevated. Pt has rx for Glipizide 5 mg and Metformin 500 mg both of which he is taking. Cam asks if she can make sure the patient is getting both doses of Empaglliflosin for the next week and keep a check on his blood sugars, can they hold off on doing the insulin? Cam states she will be the one in charge of making sure he gets both doses.  I updated pt's medication list. Pt has f/u appointment with you on 06/24/2022 @ 10:15. Thank you.

## 2022-06-20 ENCOUNTER — Other Ambulatory Visit: Payer: Self-pay

## 2022-06-20 DIAGNOSIS — E118 Type 2 diabetes mellitus with unspecified complications: Secondary | ICD-10-CM

## 2022-06-20 MED ORDER — PIOGLITAZONE HCL 30 MG PO TABS: 30.0000 mg | ORAL_TABLET | Freq: Every day | ORAL | 3 refills | Status: DC

## 2022-06-24 ENCOUNTER — Ambulatory Visit: Payer: No Typology Code available for payment source | Admitting: Family Medicine

## 2022-07-09 ENCOUNTER — Ambulatory Visit (INDEPENDENT_AMBULATORY_CARE_PROVIDER_SITE_OTHER): Payer: No Typology Code available for payment source

## 2022-07-09 VITALS — Ht 71.0 in | Wt 156.0 lb

## 2022-07-09 DIAGNOSIS — Z0000 Encounter for general adult medical examination without abnormal findings: Secondary | ICD-10-CM

## 2022-07-09 NOTE — Patient Instructions (Signed)
Ryan Gilmore , Thank you for taking time to come for your Medicare Wellness Visit. I appreciate your ongoing commitment to your health goals. Please review the following plan we discussed and let me know if I can assist you in the future.   These are the goals we discussed:  Goals      increase water intake     Drink more water        This is a list of the screening recommended for you and due dates:  Health Maintenance  Topic Date Due   COVID-19 Vaccine (5 - Pfizer risk series) 08/05/2022*   Tetanus Vaccine  10/03/2022*   Pneumonia Vaccine (2 - PPSV23 or PCV20) 10/04/2022*   Hemoglobin A1C  12/16/2022   Complete foot exam   01/30/2023   Eye exam for diabetics  05/21/2023   Flu Shot  Completed   HPV Vaccine  Aged Out   Zoster (Shingles) Vaccine  Discontinued  *Topic was postponed. The date shown is not the original due date.    Advanced directives: Please bring a copy of your health care power of attorney and living will to the office to be added to your chart at your convenience.   Conditions/risks identified: Aim for 30 minutes of exercise or brisk walking, 6-8 glasses of water, and 5 servings of fruits and vegetables each day.   Next appointment: Follow up in one year for your annual wellness visit. 07/2023.  Preventive Care 51 Years and Older, Male  Preventive care refers to lifestyle choices and visits with your health care provider that can promote health and wellness. What does preventive care include? A yearly physical exam. This is also called an annual well check. Dental exams once or twice a year. Routine eye exams. Ask your health care provider how often you should have your eyes checked. Personal lifestyle choices, including: Daily care of your teeth and gums. Regular physical activity. Eating a healthy diet. Avoiding tobacco and drug use. Limiting alcohol use. Practicing safe sex. Taking low doses of aspirin every day. Taking vitamin and mineral  supplements as recommended by your health care provider. What happens during an annual well check? The services and screenings done by your health care provider during your annual well check will depend on your age, overall health, lifestyle risk factors, and family history of disease. Counseling  Your health care provider may ask you questions about your: Alcohol use. Tobacco use. Drug use. Emotional well-being. Home and relationship well-being. Sexual activity. Eating habits. History of falls. Memory and ability to understand (cognition). Work and work Statistician. Screening  You may have the following tests or measurements: Height, weight, and BMI. Blood pressure. Lipid and cholesterol levels. These may be checked every 5 years, or more frequently if you are over 18 years old. Skin check. Lung cancer screening. You may have this screening every year starting at age 65 if you have a 30-pack-year history of smoking and currently smoke or have quit within the past 15 years. Fecal occult blood test (FOBT) of the stool. You may have this test every year starting at age 80. Flexible sigmoidoscopy or colonoscopy. You may have a sigmoidoscopy every 5 years or a colonoscopy every 10 years starting at age 86. Prostate cancer screening. Recommendations will vary depending on your family history and other risks. Hepatitis C blood test. Hepatitis B blood test. Sexually transmitted disease (STD) testing. Diabetes screening. This is done by checking your blood sugar (glucose) after you have not eaten for  a while (fasting). You may have this done every 1-3 years. Abdominal aortic aneurysm (AAA) screening. You may need this if you are a current or former smoker. Osteoporosis. You may be screened starting at age 11 if you are at high risk. Talk with your health care provider about your test results, treatment options, and if necessary, the need for more tests. Vaccines  Your health care provider  may recommend certain vaccines, such as: Influenza vaccine. This is recommended every year. Tetanus, diphtheria, and acellular pertussis (Tdap, Td) vaccine. You may need a Td booster every 10 years. Zoster vaccine. You may need this after age 74. Pneumococcal 13-valent conjugate (PCV13) vaccine. One dose is recommended after age 39. Pneumococcal polysaccharide (PPSV23) vaccine. One dose is recommended after age 63. Talk to your health care provider about which screenings and vaccines you need and how often you need them. This information is not intended to replace advice given to you by your health care provider. Make sure you discuss any questions you have with your health care provider. Document Released: 10/19/2015 Document Revised: 06/11/2016 Document Reviewed: 07/24/2015 Elsevier Interactive Patient Education  2017 [employer] Prevention in the Home Falls can cause injuries. They can happen to people of all ages. There are many things you can do to make your home safe and to help prevent falls. What can I do on the outside of my home? Regularly fix the edges of walkways and driveways and fix any cracks. Remove anything that might make you trip as you walk through a door, such as a raised step or threshold. Trim any bushes or trees on the path to your home. Use bright outdoor lighting. Clear any walking paths of anything that might make someone trip, such as rocks or tools. Regularly check to see if handrails are loose or broken. Make sure that both sides of any steps have handrails. Any raised decks and porches should have guardrails on the edges. Have any leaves, snow, or ice cleared regularly. Use sand or salt on walking paths during winter. Clean up any spills in your garage right away. This includes oil or grease spills. What can I do in the bathroom? Use night lights. Install grab bars by the toilet and in the tub and shower. Do not use towel bars as grab bars. Use  non-skid mats or decals in the tub or shower. If you need to sit down in the shower, use a plastic, non-slip stool. Keep the floor dry. Clean up any water that spills on the floor as soon as it happens. Remove soap buildup in the tub or shower regularly. Attach bath mats securely with double-sided non-slip rug tape. Do not have throw rugs and other things on the floor that can make you trip. What can I do in the bedroom? Use night lights. Make sure that you have a light by your bed that is easy to reach. Do not use any sheets or blankets that are too big for your bed. They should not hang down onto the floor. Have a firm chair that has side arms. You can use this for support while you get dressed. Do not have throw rugs and other things on the floor that can make you trip. What can I do in the kitchen? Clean up any spills right away. Avoid walking on wet floors. Keep items that you use a lot in easy-to-reach places. If you need to reach something above you, use a strong step stool that has a  grab bar. Keep electrical cords out of the way. Do not use floor polish or wax that makes floors slippery. If you must use wax, use non-skid floor wax. Do not have throw rugs and other things on the floor that can make you trip. What can I do with my stairs? Do not leave any items on the stairs. Make sure that there are handrails on both sides of the stairs and use them. Fix handrails that are broken or loose. Make sure that handrails are as long as the stairways. Check any carpeting to make sure that it is firmly attached to the stairs. Fix any carpet that is loose or worn. Avoid having throw rugs at the top or bottom of the stairs. If you do have throw rugs, attach them to the floor with carpet tape. Make sure that you have a light switch at the top of the stairs and the bottom of the stairs. If you do not have them, ask someone to add them for you. What else can I do to help prevent falls? Wear  shoes that: Do not have high heels. Have rubber bottoms. Are comfortable and fit you well. Are closed at the toe. Do not wear sandals. If you use a stepladder: Make sure that it is fully opened. Do not climb a closed stepladder. Make sure that both sides of the stepladder are locked into place. Ask someone to hold it for you, if possible. Clearly mark and make sure that you can see: Any grab bars or handrails. First and last steps. Where the edge of each step is. Use tools that help you move around (mobility aids) if they are needed. These include: Canes. Walkers. Scooters. Crutches. Turn on the lights when you go into a dark area. Replace any light bulbs as soon as they burn out. Set up your furniture so you have a clear path. Avoid moving your furniture around. If any of your floors are uneven, fix them. If there are any pets around you, be aware of where they are. Review your medicines with your doctor. Some medicines can make you feel dizzy. This can increase your chance of falling. Ask your doctor what other things that you can do to help prevent falls. This information is not intended to replace advice given to you by your health care provider. Make sure you discuss any questions you have with your health care provider. Document Released: 07/19/2009 Document Revised: 02/28/2016 Document Reviewed: 10/27/2014 Elsevier Interactive Patient Education  2017 [employer].

## 2022-07-09 NOTE — Progress Notes (Cosign Needed Addendum)
Subjective:   Ryan Gilmore is a 88 y.o. male who presents for Medicare Annual/Subsequent preventive examination. Virtual Visit via Telephone Note  I connected with  Ryan Gilmore on 07/09/22 at  8:30 AM EDT by telephone and verified that I am speaking with the correct person using two identifiers.  Location: Patient: HOME Provider: [facility] Persons participating in the virtual visit: patient/Nurse Health Advisor   I discussed the limitations, risks, security and privacy concerns of performing an evaluation and management service by telephone and the availability of in person appointments. The patient expressed understanding and agreed to proceed.  Interactive audio and video telecommunications were attempted between this nurse and patient, however failed, due to patient having technical difficulties OR patient did not have access to video capability.  We continued and completed visit with audio only.  Some vital signs may be absent or patient reported.   Ryan Driver, LPN  Review of Systems     Cardiac Risk Factors include: advanced age (>28mn, >>40women);diabetes mellitus;hypertension;dyslipidemia;male gender;sedentary lifestyle     Objective:    Today's Vitals   07/09/22 0827  Weight: 156 lb (70.8 kg)  Height: _0  (1.803 m)   Body mass index is 21.76 kg/m.     07/09/2022    8:34 AM 07/05/2021   10:02 AM 05/18/2020   11:16 AM 04/26/2019    4:03 PM 04/26/2019    9:03 AM 03/17/2019    6:27 PM 10/24/2017    2:48 PM  Advanced Directives  Does Patient Have a Medical Advance Directive? Yes Yes Unable to assess, patient is non-responsive or altered mental status No No No No  Type of Advance Directive [place]       Copy of [place] Chart? No - copy requested No - copy requested       Would patient like information on creating a medical advance directive?  No - Patient  declined  No - Patient declined  No - Patient declined No - Patient declined    Current Medications (verified) Outpatient Encounter Medications as of 07/09/2022  Medication Sig   aspirin EC 81 MG tablet Take 81 mg by mouth daily. Swallow whole.   Blood Glucose Monitoring Suppl (BLOOD GLUCOSE SYSTEM PAK) KIT Please dispense based on patient and insurance preference. Use as directed to monitor FSBS 2x. Dx: E11.E11.65.   empagliflozin (JARDIANCE) 25 MG TABS tablet Take 25 mg by mouth daily.   galantamine (RAZADYNE) 12 MG tablet Take 12 mg by mouth 2 (two) times daily.   glipiZIDE (GLUCOTROL) 5 MG tablet Take by mouth daily before breakfast.   Glucose Blood (BLOOD GLUCOSE TEST STRIPS) STRP Please dispense one touch verio flex lancets, and testing strips. Use as directed to monitor FSBS 2x. Dx: E11.E11.65.   insulin glargine (LANTUS) 100 UNIT/ML injection Inject 14 Units into the skin daily.   Lancets MISC Please dispense based on patient and insurance preference. Use as directed to monitor FSBS 2x. Dx: E11.E11.65.   losartan (COZAAR) 25 MG tablet Take 25 mg by mouth daily.   Memantine HCl (NAMENDA PO) Take by mouth.   metFORMIN (GLUCOPHAGE) 500 MG tablet TAKE 2 TABLETS BY MOUTH TWICE DAILY WITH A MEAL   Multiple Vitamin (MULTIVITAMIN) tablet Take 1 tablet by mouth daily.   pioglitazone (ACTOS) 30 MG tablet Take 1 tablet (30 mg total) by mouth daily.   rosuvastatin (CRESTOR) 40 MG tablet Take 40 mg  by mouth daily.   Glimepiride (AMARYL PO) Take by mouth. (Patient not taking: Reported on 06/19/2022)   No facility-administered encounter medications on file as of 07/09/2022.    Allergies (verified) Patient has no known allergies.   History: Past Medical History:  Diagnosis Date   Bleeding duodenal ulcer    ?  1980s   Dementia ([place])    Diabetes mellitus without complication ([place])    Hyperlipidemia    Past Surgical History:  Procedure Laterality Date   APPENDECTOMY     Family History   Problem Relation Age of Onset   Heart disease Father    Breast cancer Sister    Breast cancer Sister    Diabetes Brother    Social History   Socioeconomic History   Marital status: Married    Spouse name: Ryan Gilmore   Number of children: 3   Years of education: Not on file   Highest education level: Not on file  Occupational History   Not on file  Tobacco Use   Smoking status: Never   Smokeless tobacco: Former    Types: Chew  Substance and Sexual Activity   Alcohol use: Not Currently   Drug use: Never   Sexual activity: Not Currently  Other Topics Concern   Not on file  Social History Narrative   2 daughters, 1 son. Daughter, Cam is POA.    Social Determinants of Health   Financial Resource Strain: Low Risk  (07/09/2022)   Overall Financial Resource Strain (CARDIA)    Difficulty of Paying Living Expenses: Not hard at all  Food Insecurity: No Food Insecurity (07/09/2022)   Hunger Vital Sign    Worried About Running Out of Food in the Last Year: Never true    Ran Out of Food in the Last Year: Never true  Transportation Needs: No Transportation Needs (07/09/2022)   PRAPARE - Hydrologist (Medical): No    Lack of Transportation (Non-Medical): No  Physical Activity: Inactive (07/09/2022)   Exercise Vital Sign    Days of Exercise per Week: 0 days    Minutes of Exercise per Session: 0 min  Stress: No Stress Concern Present (07/09/2022)   [place]    Feeling of Stress : Not at all  Social Connections: Deputy (07/09/2022)   Social Connection and Isolation Panel [NHANES]    Frequency of Communication with Friends and Family: More than three times a week    Frequency of Social Gatherings with Friends and Family: More than three times a week    Attends Religious Services: 1 to 4 times per year    Active Member of [employer] or Organizations: Yes    Attends Archivist  Meetings: 1 to 4 times per year    Marital Status: Married    Tobacco Counseling Counseling given: Not Answered   Clinical Intake:  Pre-visit preparation completed: Yes  Pain : No/denies pain     BMI - recorded: 21.76 Nutritional Status: BMI of 19-24  Normal Nutritional Risks: None Diabetes: Yes  How often do you need to have someone help you when you read instructions, pamphlets, or other written materials from your doctor or pharmacy?: 1 - Never  Diabetic?Nutrition Risk Assessment:  Has the patient had any N/V/D within the last 2 months?  No  Does the patient have any non-healing wounds?  No  Has the patient had any unintentional weight loss or weight gain?  No  Diabetes:  Is the patient diabetic?  Yes  If diabetic, was a CBG obtained today?  No  Did the patient bring in their glucometer from home?  No  How often do you monitor your CBG's? daily.   Financial Strains and Diabetes Management:  Are you having any financial strains with the device, your supplies or your medication? No .  Does the patient want to be seen by Chronic Care Management for management of their diabetes?  No  Would the patient like to be referred to a Nutritionist or for Diabetic Management?  No   Diabetic Exams:  Diabetic Eye Exam: Com[facility] 08/2023 at [facility] Overdue for diabetic eye exam. Pt has been advised about the importance in completing this exam. A referral has been placed today. Message sent to referral coordinator for scheduling purposes. Advised pt to expect a call from office referred to regarding appt.  Diabetic Foot Exam: Completed 01/29/2022. Pt has been advised about the importance in completing this exam.   Interpreter Needed?: No  Information entered by :: mj Jaelon Gatley, lpn   Activities of Daily Living    07/09/2022    8:34 AM  In your present state of health, do you have any difficulty performing the following activities:  Hearing? 0  Vision? 0  Difficulty concentrating  or making decisions? 1  Comment Pt has known dementia dx.  Walking or climbing stairs? 0  Dressing or bathing? 0  Doing errands, shopping? 0  Preparing Food and eating ? N  Using the Toilet? N  In the past six months, have you accidently leaked urine? N  Do you have problems with loss of bowel control? N  Managing your Medications? N  Managing your Finances? N  Housekeeping or managing your Housekeeping? N    Patient Care Team: [name], MD as PCP - General (Family Medicine)  Indicate any recent Medical Services you may have received from other than [facility] providers in the past year (date may be approximate).     Assessment:   This is a routine wellness examination for Isaish.  Hearing/Vision screen No results found.  Dietary issues and exercise activities discussed: Current Exercise Habits: The patient does not participate in regular exercise at present, Exercise limited by: cardiac condition(s);neurologic condition(s);respiratory conditions(s)   Goals Addressed             This Visit's Progress    increase water intake   On track    Drink more water       Depression Screen    07/09/2022    8:32 AM 06/17/2022    9:17 AM 07/05/2021   10:00 AM 04/23/2018   11:11 AM 11/09/2017    8:22 AM 11/02/2017   11:36 AM 10/29/2017   10:17 AM  PHQ 2/9 Scores  PHQ - 2 Score 0 0 0 0 0 1 2  PHQ- 9 Score     0 4 8    Fall Risk    07/09/2022    8:34 AM 06/17/2022    9:17 AM 07/05/2021   10:02 AM 04/23/2018   11:11 AM 11/09/2017    8:22 AM  Fall Risk   Falls in the past year? 0 0 0 No No  Number falls in past yr: 0 0 0    Injury with Fall? 0 0 0    Risk for fall due to : Impaired balance/gait;Impaired mobility Impaired balance/gait Impaired vision;Impaired mobility;Mental status change    Follow up Falls prevention discussed Falls prevention discussed  Falls prevention discussed      FALL RISK PREVENTION PERTAINING TO THE HOME:  Any stairs in or around the home? No  If  so, are there any without handrails? Yes  Home free of loose throw rugs in walkways, pet beds, electrical cords, etc? Yes  Adequate lighting in your home to reduce risk of falls? Yes   ASSISTIVE DEVICES UTILIZED TO PREVENT FALLS:  Life alert? No  Use of a cane, walker or w/c? Yes  Grab bars in the bathroom? No  Shower chair or bench in shower? Yes  Elevated toilet seat or a handicapped toilet? Yes   TIMED UP AND GO:  Was the test performed? No .  PHONE VISIT  Cognitive Function:        07/09/2022    8:35 AM 07/05/2021   10:04 AM  6CIT Screen  What Year? 4 points 0 points  What month? 3 points 3 points  What time? 0 points 0 points  Count back from 20 0 points 0 points  Months in reverse 4 points 4 points  Repeat phrase 10 points 10 points  Total Score 21 points 17 points    Immunizations Immunization History  Administered Date(s) Administered   Fluad Quad(high Dose 65+) 07/07/2019   Influenza, High Dose Seasonal PF 09/03/2015   Influenza,inj,Quad PF,6+ Mos 06/28/2013, 06/18/2017   Influenza-Unspecified 06/30/2022   PFIZER Comirnaty(Gray Top)Covid-19 Tri-Sucrose Vaccine 03/25/2021   PFIZER(Purple Top)SARS-COV-2 Vaccination 10/27/2019, 11/17/2019, 07/16/2020   Pneumococcal Conjugate-13 12/12/2016   Pneumococcal-Unspecified 07/05/2015    TDAP status: Due, Education has been provided regarding the importance of this vaccine. Advised may receive this vaccine at local pharmacy or Health Dept. Aware to provide a copy of the vaccination record if obtained from local pharmacy or Health Dept. Verbalized acceptance and understanding.  Flu Vaccine status: Up to date  Pneumococcal vaccine status: Up to date  Covid-19 vaccine status: Completed vaccines  Qualifies for Shingles Vaccine? Yes   Zostavax completed No   Shingrix Completed?: No.    Education has been provided regarding the importance of this vaccine. Patient has been advised to call insurance company to determine  out of pocket expense if they have not yet received this vaccine. Advised may also receive vaccine at local pharmacy or Health Dept. Verbalized acceptance and understanding.  Screening Tests Health Maintenance  Topic Date Due   COVID-19 Vaccine (5 - Pfizer risk series) 08/05/2022 (Originally 05/20/2021)   TETANUS/TDAP  10/03/2022 (Originally 06/07/1952)   Pneumonia Vaccine 6+ Years old (2 - PPSV23 or PCV20) 10/04/2022 (Originally 12/12/2017)   HEMOGLOBIN A1C  12/16/2022   FOOT EXAM  01/30/2023   OPHTHALMOLOGY EXAM  05/21/2023   INFLUENZA VACCINE  Completed   HPV VACCINES  Aged Out   Zoster Vaccines- Shingrix  Discontinued    Health Maintenance  There are no preventive care reminders to display for this patient.  Colorectal cancer screening: No longer required.   Lung Cancer Screening: (Low Dose CT Chest recommended if Age 30-80 years, 30 pack-year currently smoking OR have quit w/in 15years.) does not qualify.   Lung Cancer Screening Referral: N/A  Additional Screening:  Hepatitis C Screening: does not qualify; Completed N/A  Vision Screening: Recommended annual ophthalmology exams for early detection of glaucoma and other disorders of the eye. Is the patient up to date with their annual eye exam?  Yes  Who is the provider or what is the name of the office in which the patient attends annual eye exams? [place] [facility]  If pt is not established with a provider, would they like to be referred to a provider to establish care? No .   Dental Screening: Recommended annual dental exams for proper oral hygiene  Community Resource Referral / Chronic Care Management: CRR required this visit?  No   CCM required this visit?  No      Plan:     I have personally reviewed and noted the following in the patient's chart:   Medical and social history Use of alcohol, tobacco or illicit drugs  Current medications and supplements including opioid prescriptions. Patient is not currently  taking opioid prescriptions. Functional ability and status Nutritional status Physical activity Advanced directives List of other physicians Hospitalizations, surgeries, and ER visits in previous 12 months Vitals Screenings to include cognitive, depression, and falls Referrals and appointments  In addition, I have reviewed and discussed with patient certain preventive protocols, quality metrics, and best practice recommendations. A written personalized care plan for preventive services as well as general preventive health recommendations were provided to patient.     Ryan Driver, LPN   06/13/1190   Nurse Notes: 6 CIT score of 21. Up to to date on vaccines and age appropriate health maintenance.

## 2022-07-21 ENCOUNTER — Ambulatory Visit (INDEPENDENT_AMBULATORY_CARE_PROVIDER_SITE_OTHER): Payer: No Typology Code available for payment source | Admitting: Family Medicine

## 2022-07-21 VITALS — BP 120/62 | HR 81 | Wt 157.0 lb

## 2022-07-21 DIAGNOSIS — E1165 Type 2 diabetes mellitus with hyperglycemia: Secondary | ICD-10-CM

## 2022-07-21 DIAGNOSIS — F0150 Vascular dementia without behavioral disturbance: Secondary | ICD-10-CM | POA: Diagnosis not present

## 2022-07-21 DIAGNOSIS — E538 Deficiency of other specified B group vitamins: Secondary | ICD-10-CM

## 2022-07-21 MED ORDER — CYANOCOBALAMIN 1000 MCG/ML IJ SOLN
1000.0000 ug | Freq: Once | INTRAMUSCULAR | Status: AC
  Administered 2022-07-21: 1000 ug via INTRAMUSCULAR

## 2022-07-21 NOTE — Addendum Note (Signed)
Addended by: Randal Buba K on: 07/21/2022 11:48 AM   Modules accepted: Orders

## 2022-07-21 NOTE — Progress Notes (Signed)
Subjective:    Patient ID: Ryan Gilmore, male    DOB: 1933-09-03, 88 y.o.   MRN: [id number]  HPI 06/17/22 Patient is here today with his wife.  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  Wife has mild to moderate dementia.  Daughter is unable to be here today.  Wife is uncertain of the medications he is taking.  He gets his medicines from the [place].  Medication list includes metformin and pioglitazone.  Also includes glimepiride but we do not even know what dose he is taking.  We have not refilled it in over a year.  Therefore I am not certain that he is actually taking his medications.  Recently had to go to the emergency room with a blood sugar over 400.  He continues to lose weight.  Today he is essentially noncommunicative although he smiles politely when I talk to him.  He was at the emergency room 5 days ago.  When I performed a physical exam today, the patient still has the EKG electrodes on from the emergency room.  At that time, y plan was:  Repeat lab work today including CBC and a BMP.  Due to the fact the patient has had EKG electrodes on his chest for the last 5 days, I am concerned that neither he or his wife are able to care for himself properly due to their dementia.  Therefore I am concerned that they are not taking medications properly.  I will ask the wife to go home his medication to see exactly what he is taking.  I suspect that he has not been taking 1 or 2 of his medications.  If he has been taking his medicine, we will need to start insulin.  If he needs to be on insulin, I believe that the patient's daughter needs to consider placement in an assisted living facility as I am concerned that the mother will not be able to do this  07/21/22 Sugar was 457 and A1c was 9.4. My recs were: [name] wife never called back with his medicines.  I do not feel she is giving them correctly as her dementia is worsening.  He needs to be on insulin but I do not feel she is capable of administering  this properly.  Please call daughter and start him on Lantus 10 units sq every morning.  She will have to increase lantus 1 unit each day (I.e 10 to 11) until fasting sugar in the morning is near 130.  Recheck in 1 week.  He likely will need to stay with his daughter until we get the sugars down because they are dangerously high.   However, daughter wanted to try supervising medicines as follows:                 +++++++++++++++++++++++++++++++++++++++++++++++++++++++++++++++++++++++++++++++++++++++++++++++++++++++++++++++++++++++++++++++++++++++++++++++++++++++++++++++++++++++++++++++++++++++++++++++++++++++++++++++++++++++++++++++++++++++++++++++++++++++++++++++++++++++++++++++++++++++++++++++++++++++++++++++++++++++++++++++++++++++++++++++++++++++++++++++++++++++++++++++++++++++++++++++++++++++++++++++++++++++++++++++++++++++++++++++++++++++++++++++++++++++++++++++++++++++++++++++++++++++++++++++++++++++++++++++++++++++++++++++++++++++++++++++++++++++++++++++++++++++++++++++++++++++++++++++++++++++++++++++++++++++++++++++++++++++++++++++++++++++++++++++++++++++++++++++++++++++++++++++++++++++++++++++++++++++++++++++++++++++++++++++++++++++++++++++++++++++++++++++++++                                          Advised on increasing Glipizide to 10 mg, take Empaglifozin daily and Metformin 1000 mg BID and that we are adding Actos 30 mg.  Here today with his wife and daughter.  Fasting blood sugars are 140-150!  They are not checking 2-hour postprandial sugars.  Daughter has assumed supervision of all other medications and is ensuring that he is taking them properly. Past Medical History:  Diagnosis Date   Bleeding duodenal ulcer    ?  1980s   Dementia ([place])    Diabetes mellitus without complication ([place])    Hyperlipidemia    Past Surgical History:  Procedure Laterality Date   APPENDECTOMY     Current Outpatient Medications on File Prior to  Visit  Medication Sig Dispense Refill   aspirin EC 81 MG tablet Take 81 mg by mouth daily. Swallow whole.     Blood Glucose Monitoring Suppl (BLOOD GLUCOSE SYSTEM PAK) KIT Please dispense based on patient and insurance preference. Use as directed to monitor FSBS 2x. Dx: E11.E11.65. 1 kit 1   empagliflozin (JARDIANCE) 25 MG TABS tablet Take 25 mg by mouth daily.     galantamine (RAZADYNE) 12 MG tablet Take 12 mg by mouth 2 (two) times daily.     glipiZIDE (GLUCOTROL) 5 MG tablet Take by mouth daily before breakfast.     Glucose Blood (BLOOD GLUCOSE TEST STRIPS) STRP Please dispense one touch verio flex lancets, and testing strips. Use as directed to monitor FSBS 2x. Dx: E11.E11.65. 100 strip 11   insulin glargine (LANTUS) 100 UNIT/ML injection Inject 14 Units into the skin daily.     Lancets MISC Please dispense based on patient and insurance preference. Use as directed to monitor FSBS 2x. Dx: E11.E11.65. 100 each 3   losartan (COZAAR) 25 MG tablet Take 25 mg by mouth daily.     Memantine HCl (NAMENDA PO) Take by mouth.     metFORMIN (GLUCOPHAGE) 500 MG tablet TAKE 2 TABLETS BY MOUTH TWICE DAILY WITH A MEAL 360 tablet 1   Multiple Vitamin (MULTIVITAMIN) tablet Take 1 tablet by mouth daily.     pioglitazone (ACTOS) 30 MG tablet Take 1 tablet (30 mg total) by mouth daily. 90 tablet 3   rosuvastatin (CRESTOR) 40 MG tablet Take 40 mg by mouth daily.     Glimepiride (AMARYL PO) Take by mouth. (Patient not taking: Reported on 07/21/2022)     No current facility-administered medications on file prior to visit.   No Known Allergies Social History   Socioeconomic History   Marital status: Married    Spouse name: Everlene Farrier   Number of children: 3   Years of education: Not on file   Highest education level: Not on file  Occupational History   Not on file  Tobacco Use   Smoking status: Never   Smokeless tobacco: Former    Types: Chew  Substance and Sexual Activity   Alcohol use: Not Currently    Drug use: Never   Sexual activity: Not Currently  Other Topics Concern   Not on file  Social History Narrative   2 daughters, 1 son. Daughter, Cam is POA.    Social Determinants of Health   Financial Resource Strain: Low Risk  (07/09/2022)   Overall Financial Resource Strain (CARDIA)    Difficulty of Paying Living Expenses: Not hard at all  Food Insecurity: No Food Insecurity (07/09/2022)   Hunger Vital Sign    Worried About Running Out of Food in the Last Year: Never true    Ran Out of Food in the Last Year: Never true  Transportation Needs: No Transportation Needs (07/09/2022)   PRAPARE - Hydrologist (Medical): No    Lack of Transportation (Non-Medical): No  Physical Activity: Inactive (07/09/2022)   Exercise Vital Sign    Days of Exercise per Week: 0 days    Minutes of Exercise per Session: 0 min  Stress: No Stress Concern Present (07/09/2022)   [employer] of Occupational  Health - Occupational Stress Questionnaire    Feeling of Stress : Not at all  Social Connections: Socially Integrated (07/09/2022)   Social Connection and Isolation Panel [NHANES]    Frequency of Communication with Friends and Family: More than three times a week    Frequency of Social Gatherings with Friends and Family: More than three times a week    Attends Religious Services: 1 to 4 times per year    Active Member of [employer] or Organizations: Yes    Attends Archivist Meetings: 1 to 4 times per year    Marital Status: Married  Human resources officer Violence: Not At Risk (07/09/2022)   Humiliation, Afraid, Rape, and Kick questionnaire    Fear of Current or Ex-Partner: No    Emotionally Abused: No    Physically Abused: No    Sexually Abused: No     Review of Systems     Objective:   Physical Exam Vitals reviewed.  Constitutional:      Appearance: He is underweight. He is not ill-appearing or toxic-appearing.  HENT:     Mouth/Throat:     Mouth: Mucous  membranes are moist.  Eyes:     Extraocular Movements: Extraocular movements intact.     Conjunctiva/sclera: Conjunctivae normal.     Pupils: Pupils are equal, round, and reactive to light.  Neck:     Vascular: No carotid bruit.  Cardiovascular:     Rate and Rhythm: Normal rate and regular rhythm.     Pulses: Normal pulses.     Heart sounds: Normal heart sounds. No murmur heard.    No friction rub. No gallop.  Pulmonary:     Effort: Pulmonary effort is normal. No respiratory distress.     Breath sounds: Normal breath sounds. No stridor. No wheezing, rhonchi or rales.  Abdominal:     General: Abdomen is flat. Bowel sounds are normal. There is no distension.     Palpations: Abdomen is soft.     Tenderness: There is no abdominal tenderness.  Musculoskeletal:     Cervical back: Neck supple.  Skin:    Findings: No bruising.  Neurological:     General: No focal deficit present.     Mental Status: Mental status is at baseline. He is disoriented.     Cranial Nerves: No cranial nerve deficit.     Sensory: No sensory deficit.     Motor: No weakness.  Psychiatric:        Mood and Affect: Mood normal.           Assessment & Plan:  Hyperglycemia due to diabetes mellitus ([place]) Sugars sound better.  Have asked her to start checking his 2-hour postprandial sugars to ensure that they are under 200.  If they are under 200 I believe that this is acceptable for his age especially given his medical comorbidities.  Strongly encouraged the daughter to continue to supervise all medications because of her mother and father's dementia

## 2022-08-18 ENCOUNTER — Ambulatory Visit (INDEPENDENT_AMBULATORY_CARE_PROVIDER_SITE_OTHER): Payer: Medicare HMO | Admitting: Podiatry

## 2022-08-18 DIAGNOSIS — Z91199 Patient's noncompliance with other medical treatment and regimen due to unspecified reason: Secondary | ICD-10-CM

## 2022-08-18 NOTE — Progress Notes (Signed)
1. No-show for appointment     

## 2022-09-02 ENCOUNTER — Ambulatory Visit (INDEPENDENT_AMBULATORY_CARE_PROVIDER_SITE_OTHER): Payer: Medicare HMO

## 2022-09-02 DIAGNOSIS — E538 Deficiency of other specified B group vitamins: Secondary | ICD-10-CM

## 2022-09-02 MED ORDER — CYANOCOBALAMIN 1000 MCG/ML IJ SOLN
1000.0000 ug | Freq: Once | INTRAMUSCULAR | Status: AC
  Administered 2022-09-02: 1000 ug via INTRAMUSCULAR

## 2022-10-02 ENCOUNTER — Ambulatory Visit (INDEPENDENT_AMBULATORY_CARE_PROVIDER_SITE_OTHER): Payer: Medicare HMO | Admitting: Family Medicine

## 2022-10-02 DIAGNOSIS — E538 Deficiency of other specified B group vitamins: Secondary | ICD-10-CM | POA: Diagnosis not present

## 2022-10-02 MED ORDER — CYANOCOBALAMIN 1000 MCG/ML IJ SOLN
1000.0000 ug | Freq: Once | INTRAMUSCULAR | Status: AC
  Administered 2022-10-02: 1000 ug via INTRAMUSCULAR

## 2022-10-03 NOTE — Progress Notes (Signed)
B12 shot given by nurse

## 2023-02-20 ENCOUNTER — Ambulatory Visit (INDEPENDENT_AMBULATORY_CARE_PROVIDER_SITE_OTHER): Payer: Medicare HMO

## 2023-02-20 DIAGNOSIS — E538 Deficiency of other specified B group vitamins: Secondary | ICD-10-CM

## 2023-02-20 MED ORDER — CYANOCOBALAMIN 1000 MCG/ML IJ SOLN
1000.0000 ug | Freq: Once | INTRAMUSCULAR | Status: AC
  Administered 2023-02-20: 1000 ug via INTRAMUSCULAR

## 2023-02-20 NOTE — Progress Notes (Signed)
B12 injection given to pt on the L-upper arm. Pt tol well w/no c/o. Pt left ambulatory w/vss with daughter.

## 2023-02-26 DIAGNOSIS — R972 Elevated prostate specific antigen [PSA]: Secondary | ICD-10-CM | POA: Diagnosis not present

## 2023-03-05 DIAGNOSIS — R8271 Bacteriuria: Secondary | ICD-10-CM | POA: Diagnosis not present

## 2023-03-05 DIAGNOSIS — R972 Elevated prostate specific antigen [PSA]: Secondary | ICD-10-CM | POA: Diagnosis not present

## 2023-03-05 DIAGNOSIS — N400 Enlarged prostate without lower urinary tract symptoms: Secondary | ICD-10-CM | POA: Diagnosis not present

## 2023-03-24 ENCOUNTER — Ambulatory Visit (INDEPENDENT_AMBULATORY_CARE_PROVIDER_SITE_OTHER): Payer: Medicare HMO

## 2023-03-24 DIAGNOSIS — E538 Deficiency of other specified B group vitamins: Secondary | ICD-10-CM | POA: Diagnosis not present

## 2023-03-24 MED ORDER — CYANOCOBALAMIN 1000 MCG/ML IJ SOLN
1000.0000 ug | Freq: Once | INTRAMUSCULAR | Status: AC
  Administered 2023-03-24: 1000 ug via INTRAMUSCULAR

## 2023-03-24 NOTE — Progress Notes (Signed)
Pt came in for a b12injection on L-upper arm. Pt tol well, no c/o per pt. Pt left ambulatory w/wife.

## 2023-04-23 ENCOUNTER — Ambulatory Visit (INDEPENDENT_AMBULATORY_CARE_PROVIDER_SITE_OTHER): Payer: Medicare HMO

## 2023-04-23 ENCOUNTER — Ambulatory Visit: Payer: Medicare HMO

## 2023-04-23 VITALS — Ht 71.0 in | Wt 157.0 lb

## 2023-04-23 DIAGNOSIS — E538 Deficiency of other specified B group vitamins: Secondary | ICD-10-CM

## 2023-04-23 MED ORDER — CYANOCOBALAMIN 1000 MCG/ML IJ SOLN
1000.0000 ug | Freq: Once | INTRAMUSCULAR | Status: AC
  Administered 2023-04-23: 1000 ug via INTRAMUSCULAR

## 2023-05-25 DIAGNOSIS — H25813 Combined forms of age-related cataract, bilateral: Secondary | ICD-10-CM | POA: Diagnosis not present

## 2023-05-25 DIAGNOSIS — E113292 Type 2 diabetes mellitus with mild nonproliferative diabetic retinopathy without macular edema, left eye: Secondary | ICD-10-CM | POA: Diagnosis not present

## 2023-05-25 DIAGNOSIS — H524 Presbyopia: Secondary | ICD-10-CM | POA: Diagnosis not present

## 2023-05-25 DIAGNOSIS — H40013 Open angle with borderline findings, low risk, bilateral: Secondary | ICD-10-CM | POA: Diagnosis not present

## 2023-05-26 ENCOUNTER — Telehealth: Payer: Self-pay | Admitting: Pharmacist

## 2023-05-26 NOTE — Progress Notes (Addendum)
Pharmacy Quality Measure Review  This patient is appearing on a report for being at risk of failing the adherence measure for diabetes medications this calendar year.   Medication: pioglitazone 30 mg daily Last fill date: 01/26/23 for 90 day supply  Called and spoke to pt's wife. She reported daughter, Rogue Bussing, handles all medications and asked me to call her. Left voicemail with daughter.  Daughter called me back. Reported he has switched pharmacies. He now gets all meds from the [place] which does not show in epic. No further action needed at this time.  Adam Phenix, PharmD PGY-1 Pharmacy Resident

## 2023-06-12 ENCOUNTER — Ambulatory Visit (INDEPENDENT_AMBULATORY_CARE_PROVIDER_SITE_OTHER): Payer: Medicare HMO | Admitting: Podiatry

## 2023-06-12 DIAGNOSIS — B351 Tinea unguium: Secondary | ICD-10-CM | POA: Diagnosis not present

## 2023-06-12 DIAGNOSIS — M79675 Pain in left toe(s): Secondary | ICD-10-CM

## 2023-06-12 DIAGNOSIS — E1151 Type 2 diabetes mellitus with diabetic peripheral angiopathy without gangrene: Secondary | ICD-10-CM

## 2023-06-12 DIAGNOSIS — M79674 Pain in right toe(s): Secondary | ICD-10-CM

## 2023-06-12 NOTE — Progress Notes (Signed)
  Subjective:  Patient ID: Ryan Gilmore, male    DOB: 01-06-1933,  MRN: [id number]  Chief Complaint  Patient presents with   Nail Problem    Diabetic Foot Care-nail trim     89 y.o. male presents with the above complaint. History confirmed with patient. Patient presenting with pain related to dystrophic thickened elongated nails. Patient is unable to trim own nails related to nail dystrophy and/or mobility issues. Patient does have a history of T2DM.   Objective:  Physical Exam: warm, good capillary refill nail exam onychomycosis of the toenails, onycholysis, and dystrophic nails DP pulses palpable, PT pulses palpable, and protective sensation intact Left Foot:  Pain with palpation of nails due to elongation and dystrophic growth.  Right Foot: Pain with palpation of nails due to elongation and dystrophic growth.   Assessment:   1. Pain due to onychomycosis of toenails of both feet   2. Type II diabetes mellitus with peripheral circulatory disorder [facility])      Plan:  Patient was evaluated and treated and all questions answered.   #Onychomycosis with pain  -Nails palliatively debrided as below. -Educated on self-care  Procedure: Nail Debridement Rationale: Pain Type of Debridement: manual, sharp debridement. Instrumentation: Nail nipper, rotary burr. Number of Nails: 10  Return in about 3 months (around 09/11/2023) for [facility].         Corinna Gab, DPM [facility]

## 2023-06-30 ENCOUNTER — Other Ambulatory Visit: Payer: Self-pay

## 2023-06-30 ENCOUNTER — Telehealth: Payer: Self-pay | Admitting: Family Medicine

## 2023-06-30 DIAGNOSIS — E118 Type 2 diabetes mellitus with unspecified complications: Secondary | ICD-10-CM

## 2023-06-30 MED ORDER — PIOGLITAZONE HCL 30 MG PO TABS: 30.0000 mg | ORAL_TABLET | Freq: Every day | ORAL | 0 refills | Status: AC

## 2023-06-30 NOTE — Telephone Encounter (Signed)
Prescription Request  06/30/2023  LOV: 10/02/2022  What is the name of the medication or equipment?   pioglitazone (ACTOS) 30 MG tablet  **90 day script requested**  Have you contacted your pharmacy to request a refill? Yes   Which pharmacy would you like this sent to?  [id number] - [place] ([place] - [address] [place] ([place]) [place] [address] Phone: [phone] Fax: [phone]    Patient notified that their request is being sent to the clinical staff for review and that they should receive a response within 2 business days.   Please advise pharmacist.

## 2023-07-14 ENCOUNTER — Ambulatory Visit (INDEPENDENT_AMBULATORY_CARE_PROVIDER_SITE_OTHER): Payer: Medicare HMO | Admitting: Family Medicine

## 2023-07-14 ENCOUNTER — Encounter: Payer: Self-pay | Admitting: Family Medicine

## 2023-07-14 VITALS — BP 118/64 | HR 92 | Temp 97.9°F | Ht 71.0 in | Wt 171.8 lb

## 2023-07-14 DIAGNOSIS — F0150 Vascular dementia without behavioral disturbance: Secondary | ICD-10-CM | POA: Diagnosis not present

## 2023-07-14 DIAGNOSIS — Z7984 Long term (current) use of oral hypoglycemic drugs: Secondary | ICD-10-CM

## 2023-07-14 DIAGNOSIS — Z23 Encounter for immunization: Secondary | ICD-10-CM | POA: Diagnosis not present

## 2023-07-14 DIAGNOSIS — E118 Type 2 diabetes mellitus with unspecified complications: Secondary | ICD-10-CM | POA: Diagnosis not present

## 2023-07-14 DIAGNOSIS — E538 Deficiency of other specified B group vitamins: Secondary | ICD-10-CM

## 2023-07-14 MED ORDER — CYANOCOBALAMIN 1000 MCG/ML IJ SOLN
1000.0000 ug | Freq: Once | INTRAMUSCULAR | Status: AC
  Administered 2023-07-14: 1000 ug via INTRAMUSCULAR

## 2023-07-14 NOTE — Addendum Note (Signed)
Addended by: Venia Carbon K on: 07/14/2023 02:18 PM   Modules accepted: Orders

## 2023-07-14 NOTE — Progress Notes (Signed)
Wt Readings from Last 3 Encounters:  07/14/23 171 lb 12.8 oz (77.9 kg)  04/23/23 157 lb (71.2 kg)  07/21/22 157 lb (71.2 kg)     Subjective:    Patient ID: Ryan Gilmore, male    DOB: 1933/05/16, 89 y.o.   MRN: [id number]  HPI Patient is a very pleasant 89 year old African-American gentleman here today with his daughter.  He has end-stage dementia.  He lives with his wife who has mild to moderate dementia.  The daughter is managing her finances and preparing her meals and buying her groceries.  Unfortunately, the patient and the wife tend to eat whenever they want.  When I last saw the patient last year his sugars were out of control.  At that point we discussed adding insulin however the patient and his family have elected not to take insulin as this would require strict supervision.  The daughter and the wife do not feel that they would be able to provide this level of supervision with the current living arrangement.  Therefore the patient is on glipizide 5 mg twice daily, metformin however they are giving him 2000 mg in the morning, pioglitazone 30 mg daily, and Jardiance 25 mg daily.  He has gained weight since his last visit because he stopped Ozempic.  He denies any chest pain.  He denies any nausea or vomiting.  He denies any diarrhea or upset stomach.  He denies any burning or stinging pain in his feet.  Diabetic foot exam was performed today and is normal although the patient does have diminished pulses in the left foot.  He does have excellent capillary refill.  Daughter states that the blood sugars are typically between 170 and 200 fasting in the morning.  She never checks them later in the day.  He has had no witnessed hypoglycemic episodes. Past Medical History:  Diagnosis Date   Bleeding duodenal ulcer    ?  1980s   Dementia ([facility])    Diabetes mellitus without complication ([facility])    Hyperlipidemia    Past Surgical History:  Procedure Laterality Date   APPENDECTOMY     Current  Outpatient Medications on File Prior to Visit  Medication Sig Dispense Refill   aspirin EC 81 MG tablet Take 81 mg by mouth daily. Swallow whole.     Blood Glucose Monitoring Suppl (BLOOD GLUCOSE SYSTEM PAK) KIT Please dispense based on patient and insurance preference. Use as directed to monitor FSBS 2x. Dx: E11.E11.65. 1 kit 1   empagliflozin (JARDIANCE) 25 MG TABS tablet Take 25 mg by mouth daily.     galantamine (RAZADYNE) 12 MG tablet Take 12 mg by mouth 2 (two) times daily.     glipiZIDE (GLUCOTROL) 5 MG tablet Take by mouth 2 (two) times daily before a meal.     Glucose Blood (BLOOD GLUCOSE TEST STRIPS) STRP Please dispense one touch verio flex lancets, and testing strips. Use as directed to monitor FSBS 2x. Dx: E11.E11.65. 100 strip 11   Lancets MISC Please dispense based on patient and insurance preference. Use as directed to monitor FSBS 2x. Dx: E11.E11.65. 100 each 3   losartan (COZAAR) 25 MG tablet Take 25 mg by mouth daily.     Memantine HCl (NAMENDA PO) Take by mouth.     metFORMIN (GLUCOPHAGE) 500 MG tablet TAKE 2 TABLETS BY MOUTH TWICE DAILY WITH A MEAL 360 tablet 1   Multiple Vitamin (MULTIVITAMIN) tablet Take 1 tablet by mouth daily.     pioglitazone (ACTOS) 30  MG tablet Take 1 tablet (30 mg total) by mouth daily. 60 tablet 0   rosuvastatin (CRESTOR) 40 MG tablet Take 40 mg by mouth daily.     Glimepiride (AMARYL PO) Take by mouth. (Patient not taking: Reported on 07/14/2023)     No current facility-administered medications on file prior to visit.     No Known Allergies Social History   Socioeconomic History   Marital status: Married    Spouse name: Rhunette Croft   Number of children: 3   Years of education: Not on file   Highest education level: Not on file  Occupational History   Not on file  Tobacco Use   Smoking status: Never   Smokeless tobacco: Former    Types: Chew  Substance and Sexual Activity   Alcohol use: Not Currently   Drug use: Never   Sexual activity:  Not Currently  Other Topics Concern   Not on file  Social History Narrative   2 daughters, 1 son. Daughter, Cam is POA.    Social Determinants of Health   Financial Resource Strain: Low Risk  (07/09/2022)   Overall Financial Resource Strain (CARDIA)    Difficulty of Paying Living Expenses: Not hard at all  Food Insecurity: No Food Insecurity (07/09/2022)   Hunger Vital Sign    Worried About Running Out of Food in the Last Year: Never true    Ran Out of Food in the Last Year: Never true  Transportation Needs: No Transportation Needs (07/09/2022)   PRAPARE - Administrator, Civil Service (Medical): No    Lack of Transportation (Non-Medical): No  Physical Activity: Inactive (07/09/2022)   Exercise Vital Sign    Days of Exercise per Week: 0 days    Minutes of Exercise per Session: 0 min  Stress: No Stress Concern Present (07/09/2022)   [employer] of Occupational Health - Occupational Stress Questionnaire    Feeling of Stress : Not at all  Social Connections: Socially Integrated (07/09/2022)   Social Connection and Isolation Panel [NHANES]    Frequency of Communication with Friends and Family: More than three times a week    Frequency of Social Gatherings with Friends and Family: More than three times a week    Attends Religious Services: 1 to 4 times per year    Active Member of [employer] or Organizations: Yes    Attends Banker Meetings: 1 to 4 times per year    Marital Status: Married  Catering manager Violence: Not At Risk (07/09/2022)   Humiliation, Afraid, Rape, and Kick questionnaire    Fear of Current or Ex-Partner: No    Emotionally Abused: No    Physically Abused: No    Sexually Abused: No     Review of Systems     Objective:   Physical Exam Vitals reviewed.  Constitutional:      Appearance: He is underweight. He is not ill-appearing or toxic-appearing.  HENT:     Mouth/Throat:     Mouth: Mucous membranes are moist.  Eyes:      Extraocular Movements: Extraocular movements intact.     Conjunctiva/sclera: Conjunctivae normal.     Pupils: Pupils are equal, round, and reactive to light.  Neck:     Vascular: No carotid bruit.  Cardiovascular:     Rate and Rhythm: Normal rate and regular rhythm.     Pulses: Normal pulses.     Heart sounds: Normal heart sounds. No murmur heard.    No friction rub. No  gallop.  Pulmonary:     Effort: Pulmonary effort is normal. No respiratory distress.     Breath sounds: Normal breath sounds. No stridor. No wheezing, rhonchi or rales.  Abdominal:     General: Abdomen is flat. Bowel sounds are normal. There is no distension.     Palpations: Abdomen is soft.     Tenderness: There is no abdominal tenderness.  Musculoskeletal:     Cervical back: Neck supple.  Skin:    Findings: No bruising.  Neurological:     General: No focal deficit present.     Mental Status: Mental status is at baseline. He is disoriented.     Cranial Nerves: No cranial nerve deficit.     Sensory: No sensory deficit.     Motor: No weakness.  Psychiatric:        Mood and Affect: Mood normal.           Assessment & Plan:  Controlled type 2 diabetes mellitus with complication, without long-term current use of insulin ([facility]) - Plan: Hemoglobin A1c, CBC with Differential/Platelet, COMPLETE METABOLIC PANEL WITH GFR, Lipid panel  Vitamin B12 deficiency  Vascular dementia without behavioral disturbance ([facility]) Sugars are poorly controlled.  Given the current living arrangement, I feel that precise sugar control is unlikely and potentially dangerous.  If I can keep his hemoglobin A1c in the "8" range I will be very happy.  I will also check a CBC a CMP and a lipid panel.  Offered the patient a flu shot today.  Blood pressure is excellent at 118/64.

## 2023-07-15 LAB — CBC WITH DIFFERENTIAL/PLATELET
Absolute Monocytes: 1079 {cells}/uL — ABNORMAL HIGH (ref 200–950)
Basophils Absolute: 164 {cells}/uL (ref 0–200)
Basophils Relative: 0.5 %
Eosinophils Absolute: 392 {cells}/uL (ref 15–500)
Eosinophils Relative: 1.2 %
HCT: 45.8 % (ref 38.5–50.0)
Hemoglobin: 14.6 g/dL (ref 13.2–17.1)
Lymphs Abs: 21942 {cells}/uL — ABNORMAL HIGH (ref 850–3900)
MCH: 29.6 pg (ref 27.0–33.0)
MCHC: 31.9 g/dL — ABNORMAL LOW (ref 32.0–36.0)
MCV: 92.7 fL (ref 80.0–100.0)
MPV: 11.3 fL (ref 7.5–12.5)
Monocytes Relative: 3.3 %
Neutro Abs: 9123 {cells}/uL — ABNORMAL HIGH (ref 1500–7800)
Neutrophils Relative %: 27.9 %
Platelets: 251 10*3/uL (ref 140–400)
RBC: 4.94 10*6/uL (ref 4.20–5.80)
RDW: 13.1 % (ref 11.0–15.0)
Total Lymphocyte: 67.1 %
WBC: 32.7 10*3/uL — ABNORMAL HIGH (ref 3.8–10.8)

## 2023-07-15 LAB — HEMOGLOBIN A1C
Hgb A1c MFr Bld: 9.6 %{Hb} — ABNORMAL HIGH (ref <5.7)
Mean Plasma Glucose: 229 mg/dL
eAG (mmol/L): 12.7 mmol/L

## 2023-07-15 LAB — COMPLETE METABOLIC PANEL WITH GFR
AG Ratio: 1.8 (calc) (ref 1.0–2.5)
ALT: 9 U/L (ref 9–46)
AST: 13 U/L (ref 10–35)
Albumin: 4.7 g/dL (ref 3.6–5.1)
Alkaline phosphatase (APISO): 67 U/L (ref 35–144)
BUN/Creatinine Ratio: 14 (calc) (ref 6–22)
BUN: 19 mg/dL (ref 7–25)
CO2: 28 mmol/L (ref 20–32)
Calcium: 10 mg/dL (ref 8.6–10.3)
Chloride: 102 mmol/L (ref 98–110)
Creat: 1.35 mg/dL — ABNORMAL HIGH (ref 0.70–1.22)
Globulin: 2.6 g/dL (ref 1.9–3.7)
Glucose, Bld: 345 mg/dL — ABNORMAL HIGH (ref 65–99)
Potassium: 4.4 mmol/L (ref 3.5–5.3)
Sodium: 141 mmol/L (ref 135–146)
Total Bilirubin: 0.5 mg/dL (ref 0.2–1.2)
Total Protein: 7.3 g/dL (ref 6.1–8.1)
eGFR: 50 mL/min/{1.73_m2} — ABNORMAL LOW (ref >=60)

## 2023-07-15 LAB — LIPID PANEL
Cholesterol: 117 mg/dL (ref <200)
HDL: 36 mg/dL — ABNORMAL LOW (ref >=40)
LDL Cholesterol (Calc): 64 mg/dL
Non-HDL Cholesterol (Calc): 81 mg/dL (ref <130)
Total CHOL/HDL Ratio: 3.3 (calc) (ref <5.0)
Triglycerides: 87 mg/dL (ref <150)

## 2023-07-16 ENCOUNTER — Other Ambulatory Visit: Payer: Self-pay

## 2023-07-16 ENCOUNTER — Ambulatory Visit (INDEPENDENT_AMBULATORY_CARE_PROVIDER_SITE_OTHER): Payer: Medicare HMO

## 2023-07-16 VITALS — Ht 71.0 in | Wt 171.0 lb

## 2023-07-16 DIAGNOSIS — Z0000 Encounter for general adult medical examination without abnormal findings: Secondary | ICD-10-CM | POA: Diagnosis not present

## 2023-07-16 DIAGNOSIS — D72829 Elevated white blood cell count, unspecified: Secondary | ICD-10-CM

## 2023-07-16 NOTE — Progress Notes (Signed)
Subjective:   Ryan Gilmore is a 89 y.o. male who presents for Medicare Annual/Subsequent preventive examination.  Visit Complete: Virtual I connected with  Rosana Hoes on 07/16/23 by a audio enabled telemedicine application and verified that I am speaking with the correct person using two identifiers.  Patient Location: Home  Provider Location: Office/Clinic  I discussed the limitations of evaluation and management by telemedicine. The patient expressed understanding and agreed to proceed.  Vital Signs: Because this visit was a virtual/telehealth visit, some criteria may be missing or patient reported. Any vitals not documented were not able to be obtained and vitals that have been documented are patient reported.  Cardiac Risk Factors include: advanced age (>21men, >49 women);diabetes mellitus;dyslipidemia;hypertension;male gender;sedentary lifestyle     Objective:    Today's Vitals   07/16/23 1040  Weight: 171 lb (77.6 kg)  Height: 5\' 11"  (1.803 m)   Body mass index is 23.85 kg/m.     07/16/2023   11:55 AM 07/09/2022    8:34 AM 07/05/2021   10:02 AM 05/18/2020   11:16 AM 04/26/2019    4:03 PM 04/26/2019    9:03 AM 03/17/2019    6:27 PM  Advanced Directives  Does Patient Have a Medical Advance Directive? Yes Yes Yes Unable to assess, patient is non-responsive or altered mental status No No No  Type of Advance Directive Healthcare Power of [place];Living will Healthcare Power of [place];Living will Healthcare Power of [place];Living will      Copy of Healthcare Power of Attorney in Chart? No - copy requested No - copy requested No - copy requested      Would patient like information on creating a medical advance directive?   No - Patient declined  No - Patient declined  No - Patient declined    Current Medications (verified) Outpatient Encounter Medications as of 07/16/2023  Medication Sig   aspirin EC 81 MG tablet Take 81 mg by mouth daily. Swallow whole.   Blood  Glucose Monitoring Suppl (BLOOD GLUCOSE SYSTEM PAK) KIT Please dispense based on patient and insurance preference. Use as directed to monitor FSBS 2x. Dx: E11.E11.65.   empagliflozin (JARDIANCE) 25 MG TABS tablet Take 25 mg by mouth daily.   galantamine (RAZADYNE) 12 MG tablet Take 12 mg by mouth 2 (two) times daily.   glipiZIDE (GLUCOTROL) 5 MG tablet Take by mouth 2 (two) times daily before a meal.   Glucose Blood (BLOOD GLUCOSE TEST STRIPS) STRP Please dispense one touch verio flex lancets, and testing strips. Use as directed to monitor FSBS 2x. Dx: E11.E11.65.   Lancets MISC Please dispense based on patient and insurance preference. Use as directed to monitor FSBS 2x. Dx: E11.E11.65.   losartan (COZAAR) 25 MG tablet Take 25 mg by mouth daily.   Memantine HCl (NAMENDA PO) Take by mouth.   metFORMIN (GLUCOPHAGE) 500 MG tablet TAKE 2 TABLETS BY MOUTH TWICE DAILY WITH A MEAL   Multiple Vitamin (MULTIVITAMIN) tablet Take 1 tablet by mouth daily.   pioglitazone (ACTOS) 30 MG tablet Take 1 tablet (30 mg total) by mouth daily.   rosuvastatin (CRESTOR) 40 MG tablet Take 40 mg by mouth daily.   [DISCONTINUED] Glimepiride (AMARYL PO) Take by mouth. (Patient not taking: Reported on 07/14/2023)   No facility-administered encounter medications on file as of 07/16/2023.    Allergies (verified) Patient has no known allergies.   History: Past Medical History:  Diagnosis Date   Bleeding duodenal ulcer    ?  1980s  Dementia (HCC)    Diabetes mellitus without complication (HCC)    Hyperlipidemia    Past Surgical History:  Procedure Laterality Date   APPENDECTOMY     Family History  Problem Relation Age of Onset   Heart disease Father    Breast cancer Sister    Breast cancer Sister    Diabetes Brother    Social History   Socioeconomic History   Marital status: Married    Spouse name: Rhunette Croft   Number of children: 3   Years of education: Not on file   Highest education level: Not on file   Occupational History   Not on file  Tobacco Use   Smoking status: Never   Smokeless tobacco: Former    Types: Chew  Substance and Sexual Activity   Alcohol use: Not Currently   Drug use: Never   Sexual activity: Not Currently  Other Topics Concern   Not on file  Social History Narrative   2 daughters, 1 son. Daughter, Cam is POA.    Social Determinants of Health   Financial Resource Strain: Low Risk  (07/16/2023)   Overall Financial Resource Strain (CARDIA)    Difficulty of Paying Living Expenses: Not hard at all  Food Insecurity: No Food Insecurity (07/16/2023)   Hunger Vital Sign    Worried About Running Out of Food in the Last Year: Never true    Ran Out of Food in the Last Year: Never true  Transportation Needs: No Transportation Needs (07/16/2023)   PRAPARE - Administrator, Civil Service (Medical): No    Lack of Transportation (Non-Medical): No  Physical Activity: Inactive (07/16/2023)   Exercise Vital Sign    Days of Exercise per Week: 0 days    Minutes of Exercise per Session: 0 min  Stress: No Stress Concern Present (07/16/2023)   [employer] of Occupational Health - Occupational Stress Questionnaire    Feeling of Stress : Not at all  Social Connections: Socially Integrated (07/16/2023)   Social Connection and Isolation Panel [NHANES]    Frequency of Communication with Friends and Family: More than three times a week    Frequency of Social Gatherings with Friends and Family: Three times a week    Attends Religious Services: 1 to 4 times per year    Active Member of Clubs or Organizations: Yes    Attends Banker Meetings: 1 to 4 times per year    Marital Status: Married    Tobacco Counseling Counseling given: Not Answered   Clinical Intake:  Pre-visit preparation completed: Yes  Pain : No/denies pain     Diabetes: No  How often do you need to have someone help you when you read instructions, pamphlets, or other written  materials from your doctor or pharmacy?: 1 - Never  Interpreter Needed?: No  Comments: Assisted with visit by wife Information entered by :: Kandis Fantasia LPN   Activities of Daily Living    07/16/2023   10:45 AM  In your present state of health, do you have any difficulty performing the following activities:  Hearing? 1  Vision? 0  Difficulty concentrating or making decisions? 1  Walking or climbing stairs? 1  Dressing or bathing? 0  Doing errands, shopping? 1  Preparing Food and eating ? N  Using the Toilet? N  In the past six months, have you accidently leaked urine? N  Do you have problems with loss of bowel control? N  Managing your Medications? Y  Managing  your Finances? Y  Housekeeping or managing your Housekeeping? Y    Patient Care Team: Donita Brooks, MD as PCP - General (Family Medicine) Clinic, [facility]  Indicate any recent Medical Services you may have received from other than [facility] providers in the past year (date may be approximate).     Assessment:   This is a routine wellness examination for [place].  Hearing/Vision screen Hearing Screening - Comments:: Hard of hearing  Vision Screening - Comments:: Wears rx glasses - up to date with routine eye exams with Dr. Zenaida Niece     Goals Addressed             This Visit's Progress    Prevent falls        Depression Screen    07/16/2023   11:54 AM 07/14/2023   12:32 PM 07/09/2022    8:32 AM 06/17/2022    9:17 AM 07/05/2021   10:00 AM 04/23/2018   11:11 AM 11/09/2017    8:22 AM  PHQ 2/9 Scores  PHQ - 2 Score   0 0 0 0 0  PHQ- 9 Score       0  Exception Documentation Other- indicate reason in comment box Other- indicate reason in comment box       Not completed pt unable to complete Pt unable to do due to dementia dx.         Fall Risk    07/16/2023   11:55 AM 07/14/2023   12:31 PM 07/09/2022    8:34 AM 06/17/2022    9:17 AM 07/05/2021   10:02 AM  Fall Risk   Falls in the past year? 1 1 0 0  0  Number falls in past yr: 0 0 0 0 0  Injury with Fall? 1 0 0 0 0  Risk for fall due to : History of fall(s);Impaired balance/gait;Impaired mobility History of fall(s);Impaired balance/gait;Impaired mobility;Mental status change Impaired balance/gait;Impaired mobility Impaired balance/gait Impaired vision;Impaired mobility;Mental status change  Follow up Education provided;Falls prevention discussed;Falls evaluation completed Education provided;Falls prevention discussed;Falls evaluation completed Falls prevention discussed Falls prevention discussed Falls prevention discussed    MEDICARE RISK AT HOME: Medicare Risk at Home Any stairs in or around the home?: No If so, are there any without handrails?: No Home free of loose throw rugs in walkways, pet beds, electrical cords, etc?: Yes Adequate lighting in your home to reduce risk of falls?: Yes Life alert?: No Use of a cane, walker or w/c?: Yes Grab bars in the bathroom?: Yes Shower chair or bench in shower?: Yes Elevated toilet seat or a handicapped toilet?: Yes  TIMED UP AND GO:  Was the test performed?  No    Cognitive Function:    07/16/2023   11:56 AM  MMSE - Mini Mental State Exam  Not completed: Unable to complete        07/09/2022    8:35 AM 07/05/2021   10:04 AM  6CIT Screen  What Year? 4 points 0 points  What month? 3 points 3 points  What time? 0 points 0 points  Count back from 20 0 points 0 points  Months in reverse 4 points 4 points  Repeat phrase 10 points 10 points  Total Score 21 points 17 points    Immunizations Immunization History  Administered Date(s) Administered   Fluad Quad(high Dose 65+) 07/07/2019   Fluad Trivalent(High Dose 65+) 07/14/2023   Influenza, High Dose Seasonal PF 09/03/2015   Influenza,inj,Quad PF,6+ Mos 06/28/2013, 06/18/2017   Influenza-Unspecified  06/30/2022   PFIZER Comirnaty(Gray Top)Covid-19 Tri-Sucrose Vaccine 03/25/2021   PFIZER(Purple Top)SARS-COV-2 Vaccination  10/27/2019, 11/17/2019, 07/16/2020   Pneumococcal Conjugate-13 12/12/2016   Pneumococcal-Unspecified 07/05/2015    TDAP status: Due, Education has been provided regarding the importance of this vaccine. Advised may receive this vaccine at local pharmacy or Health Dept. Aware to provide a copy of the vaccination record if obtained from local pharmacy or Health Dept. Verbalized acceptance and understanding.  Flu Vaccine status: Up to date  Pneumococcal vaccine status: Due, Education has been provided regarding the importance of this vaccine. Advised may receive this vaccine at local pharmacy or Health Dept. Aware to provide a copy of the vaccination record if obtained from local pharmacy or Health Dept. Verbalized acceptance and understanding.  Covid-19 vaccine status: Information provided on how to obtain vaccines.   Qualifies for Shingles Vaccine? Yes   Zostavax completed No   Shingrix Completed?: No.    Education has been provided regarding the importance of this vaccine. Patient has been advised to call insurance company to determine out of pocket expense if they have not yet received this vaccine. Advised may also receive vaccine at local pharmacy or Health Dept. Verbalized acceptance and understanding.  Screening Tests Health Maintenance  Topic Date Due   COVID-19 Vaccine (5 - 2023-24 season) 10/02/2023 (Originally 06/07/2023)   OPHTHALMOLOGY EXAM  10/06/2023 (Originally 05/21/2023)   Pneumonia Vaccine 28+ Years old (2 of 2 - PPSV23 or PCV20) 07/13/2024 (Originally 12/12/2017)   HEMOGLOBIN A1C  01/12/2024   FOOT EXAM  07/13/2024   INFLUENZA VACCINE  Completed   HPV VACCINES  Aged Out   DTaP/Tdap/Td  Discontinued   Zoster Vaccines- Shingrix  Discontinued    Health Maintenance  There are no preventive care reminders to display for this patient.  Colorectal cancer screening: No longer required.   Lung Cancer Screening: (Low Dose CT Chest recommended if Age 24-80 years, 20 pack-year  currently smoking OR have quit w/in 15years.) does not qualify.   Lung Cancer Screening Referral: n/a  Additional Screening:  Hepatitis C Screening: does not qualify  Vision Screening: Recommended annual ophthalmology exams for early detection of glaucoma and other disorders of the eye. Is the patient up to date with their annual eye exam?  Yes  Who is the provider or what is the name of the office in which the patient attends annual eye exams? Dr.Van If pt is not established with a provider, would they like to be referred to a provider to establish care? No .   Dental Screening: Recommended annual dental exams for proper oral hygiene  Diabetic Foot Exam: Diabetic Foot Exam: Completed 07/14/23  Community Resource Referral / Chronic Care Management: CRR required this visit?  No   CCM required this visit?  No     Plan:     I have personally reviewed and noted the following in the patient's chart:   Medical and social history Use of alcohol, tobacco or illicit drugs  Current medications and supplements including opioid prescriptions. Patient is not currently taking opioid prescriptions. Functional ability and status Nutritional status Physical activity Advanced directives List of other physicians Hospitalizations, surgeries, and ER visits in previous 12 months Vitals Screenings to include cognitive, depression, and falls Referrals and appointments  In addition, I have reviewed and discussed with patient certain preventive protocols, quality metrics, and best practice recommendations. A written personalized care plan for preventive services as well as general preventive health recommendations were provided to patient.  Kandis Fantasia [place], [place]   16/07/9603   After Visit Summary: (Mail) Due to this being a telephonic visit, the after visit summary with patients personalized plan was offered to patient via mail   Nurse Notes: No concerns at this time

## 2023-07-16 NOTE — Patient Instructions (Signed)
Mr. Ryan Gilmore , Thank you for taking time to come for your Medicare Wellness Visit. I appreciate your ongoing commitment to your health goals. Please review the following plan we discussed and let me know if I can assist you in the future.   Referrals/Orders/Follow-Ups/Clinician Recommendations: Aim for 30 minutes of exercise or brisk walking, 6-8 glasses of water, and 5 servings of fruits and vegetables each day.  This is a list of the screening recommended for you and due dates:  Health Maintenance  Topic Date Due   COVID-19 Vaccine (5 - 2023-24 season) 10/02/2023*   Eye exam for diabetics  10/06/2023*   Pneumonia Vaccine (2 of 2 - PPSV23 or PCV20) 07/13/2024*   Hemoglobin A1C  01/12/2024   Complete foot exam   07/13/2024   Flu Shot  Completed   HPV Vaccine  Aged Out   DTaP/Tdap/Td vaccine  Discontinued   Zoster (Shingles) Vaccine  Discontinued  *Topic was postponed. The date shown is not the original due date.    Advanced directives: (Copy Requested) Please bring a copy of your health care power of attorney and living will to the office to be added to your chart at your convenience.  Next Medicare Annual Wellness Visit scheduled for next year: Yes

## 2023-07-17 ENCOUNTER — Ambulatory Visit (HOSPITAL_COMMUNITY)
Admission: RE | Admit: 2023-07-17 | Discharge: 2023-07-17 | Disposition: A | Discharge status: Home / Self Care | Payer: Medicare HMO | Source: Ambulatory Visit | Attending: Family Medicine | Admitting: Family Medicine

## 2023-07-17 DIAGNOSIS — D72829 Elevated white blood cell count, unspecified: Secondary | ICD-10-CM | POA: Diagnosis not present

## 2023-07-20 ENCOUNTER — Other Ambulatory Visit: Payer: No Typology Code available for payment source

## 2023-07-20 DIAGNOSIS — D72829 Elevated white blood cell count, unspecified: Secondary | ICD-10-CM

## 2023-07-21 ENCOUNTER — Other Ambulatory Visit: Payer: Self-pay | Admitting: Family Medicine

## 2023-07-21 LAB — AFB CULTURE, BLOOD

## 2023-07-21 LAB — URINALYSIS, ROUTINE W REFLEX MICROSCOPIC
Bilirubin Urine: NEGATIVE
Hyaline Cast: NONE SEEN /[LPF]
Ketones, ur: NEGATIVE
Nitrite: NEGATIVE
Protein, ur: NEGATIVE
Specific Gravity, Urine: 1.036 — ABNORMAL HIGH (ref 1.001–1.035)
pH: <=5 (ref 5.0–8.0)

## 2023-07-21 LAB — URINE CULTURE
MICRO NUMBER:: 15590596
SPECIMEN QUALITY:: ADEQUATE

## 2023-07-21 LAB — MICROSCOPIC MESSAGE

## 2023-07-21 LAB — PATHOLOGIST SMEAR REVIEW

## 2023-07-21 MED ORDER — CEPHALEXIN 500 MG PO CAPS: 500.0000 mg | ORAL_CAPSULE | Freq: Three times a day (TID) | ORAL | 0 refills | Status: DC

## 2023-08-04 ENCOUNTER — Telehealth: Payer: Self-pay

## 2023-08-04 NOTE — Telephone Encounter (Signed)
Spoke with pt's daughter, Cam regarding peripheral smear. Cam states that Mr. Ryan Gilmore does see an Best boy with the [facility] in [place]. Thank you.

## 2023-08-18 ENCOUNTER — Ambulatory Visit (INDEPENDENT_AMBULATORY_CARE_PROVIDER_SITE_OTHER): Payer: Medicare HMO

## 2023-08-18 ENCOUNTER — Telehealth: Payer: Self-pay

## 2023-08-18 DIAGNOSIS — Z23 Encounter for immunization: Secondary | ICD-10-CM | POA: Diagnosis not present

## 2023-08-18 NOTE — Telephone Encounter (Signed)
Pt came in for B12 injection. There was a miss-communication and patient was given a flu vaccine instead by another nurse. Spoke with patient's daughter, Milus Mallick and advised her of the error and verbalized understanding of all. Cam states they will come back tomorrow for the B12 injection. Thank you.

## 2023-10-13 ENCOUNTER — Encounter: Payer: Self-pay | Admitting: Podiatry

## 2023-10-13 ENCOUNTER — Ambulatory Visit (INDEPENDENT_AMBULATORY_CARE_PROVIDER_SITE_OTHER): Payer: Medicare HMO | Admitting: Podiatry

## 2023-10-13 DIAGNOSIS — B351 Tinea unguium: Secondary | ICD-10-CM

## 2023-10-13 DIAGNOSIS — M79675 Pain in left toe(s): Secondary | ICD-10-CM | POA: Diagnosis not present

## 2023-10-13 DIAGNOSIS — M79674 Pain in right toe(s): Secondary | ICD-10-CM

## 2023-10-13 DIAGNOSIS — E1151 Type 2 diabetes mellitus with diabetic peripheral angiopathy without gangrene: Secondary | ICD-10-CM

## 2023-10-19 NOTE — Progress Notes (Signed)
  Subjective:  Patient ID: Ryan Gilmore, male    DOB: 03/13/1933,  MRN: [id number]  Ryan Gilmore presents to clinic today for at risk foot care. Pt has h/o NIDDM with PAD and painful thick toenails that are difficult to trim. Pain interferes with ambulation. Aggravating factors include wearing enclosed shoe gear. Pain is relieved with periodic professional debridement.  Chief Complaint  Patient presents with   Diabetes    Patient hasn't seen his PCP in a while , and his A1c is 7   New problem(s): None.   PCP is Duanne Butler DASEN, MD.  No Known Allergies  Review of Systems: Negative except as noted in the HPI.  Objective: No changes noted in today's physical examination. There were no vitals filed for this visit. Ryan Gilmore is a pleasant 89 y.o. male WD, WN in NAD. AAO x 3.  Vascular Examination: CFT <3 seconds b/l. DP pulses faintly palpable b/l. PT pulses nonpalpable b/l. Digital hair absent. Skin temperature gradient warm to warm b/l. No pain with calf compression. No ischemia or gangrene. No cyanosis or clubbing noted b/l.    Neurological Examination: Sensation grossly intact b/l with 10 gram monofilament. Vibratory sensation intact b/l.   Dermatological Examination: Pedal skin warm and supple b/l. No open wounds b/l. No interdigital macerations. Toenails 1-5 b/l thick, discolored, elongated with subungual debris and pain on dorsal palpation.  No corns, calluses nor porokeratotic lesions noted.  Musculoskeletal Examination: Muscle strength 5/5 to all lower extremity muscle groups bilaterally. Hammertoe(s) noted to the 2-5 bilaterally.  Radiographs: None  Last HgA1c:      Latest Ref Rng & Units 07/14/2023   12:47 PM  Hemoglobin A1C  Hemoglobin-A1c <5.7 % of total Hgb 9.6    Assessment/Plan: 1. Pain due to onychomycosis of toenails of both feet   2. Type II diabetes mellitus with peripheral circulatory disorder [facility])     -[name] family member present. All  questions/concerns addressed on today's visit. -Continue daily moisturizer to feet avoiding application between toes. -Continue foot and shoe inspections daily. Monitor blood glucose per PCP/Endocrinologist's recommendations. -Patient to continue soft, supportive shoe gear daily. -Mycotic toenails 1-5 bilaterally were debrided in length and girth with sterile nail nippers and dremel without incident. -Patient/POA to call should there be question/concern in the interim.   Return in about 3 months (around 01/11/2024).  Ryan Gilmore, DPM      [facility] LOCATION: [address], [place] [address]                   Office [phone]   [facility] LOCATION: [address] [place], [place] [address] Office [phone]

## 2023-10-23 ENCOUNTER — Telehealth: Payer: Self-pay

## 2023-10-23 NOTE — Progress Notes (Signed)
Care Guide Pharmacy Note  10/23/2023 Name: [name] MRN: [id number] DOB: [birth year]  Referred By: Donita Brooks, MD Reason for referral: Care Coordination ([facility] Diabetes. )   Ryan Gilmore is a 89 y.o. year old male who is a primary care patient of Donita Brooks, MD.  Rosana Hoes was referred to the pharmacist for assistance related to: DMII  Successful contact was made with the patient to discuss pharmacy services including being ready for the pharmacist to call at least 5 minutes before the scheduled appointment time and to have medication bottles and any blood pressure readings ready for review. The patient agreed to meet with the pharmacist via telephone visit on (date/time). 11/25/23 at 10:00 a.m.   [facility]  [facility], [facility] Health Care Management Assistant Direct Dial: [phone]  Fax: [phone]

## 2023-10-28 ENCOUNTER — Telehealth: Payer: Self-pay

## 2023-10-28 ENCOUNTER — Ambulatory Visit (INDEPENDENT_AMBULATORY_CARE_PROVIDER_SITE_OTHER): Payer: Medicare HMO

## 2023-10-28 DIAGNOSIS — E538 Deficiency of other specified B group vitamins: Secondary | ICD-10-CM | POA: Diagnosis not present

## 2023-10-28 MED ORDER — CYANOCOBALAMIN 1000 MCG/ML IJ SOLN
1000.0000 ug | Freq: Once | INTRAMUSCULAR | Status: AC
  Administered 2023-10-28: 1000 ug via INTRAMUSCULAR

## 2023-10-28 NOTE — Telephone Encounter (Signed)
Pt's daughter brought in handicap placard to be completed by pcp. Please call daughter Milus Mallick when form is available to pick up. Form and intake form placed in nurse's folder.  Cb#: [phone]

## 2023-10-28 NOTE — Progress Notes (Signed)
Patient is in office today for a nurse visit for B12 Injection. Patient Injection was given in the  Right deltoid. Patient tolerated injection well.

## 2023-11-20 ENCOUNTER — Ambulatory Visit: Payer: Self-pay | Admitting: Family Medicine

## 2023-11-20 NOTE — Telephone Encounter (Signed)
  Chief Complaint: Extra doses of Metformin Symptoms: loss of appetite Frequency: About 1 week Pertinent Negatives: Patient denies abd pain, N/V, dizziness, increased confusion Disposition: [] ED /[] Urgent Care (no appt availability in office) / [] Appointment(In office/virtual)/ []  [facility]/ [] Home Care/ [] Refused Recommended Disposition /[] [facility]/ [x]  Follow-up with PCP Additional Notes: Pt's daughter Cam reports pt was previously prescribed 2 500 mg tablets of Metformin daily. [facility] to 2-2,000 mg tablets daily. Pt's daughter discovered pt's wife has been giving him 4 of the 2,000 mg tablets daily in error for about 1 week. Pt's daughter denies pt is symptomatic aside from decrease in appetite for the past week. Advised provider will be notified and will follow up with questions/concerns. Pt's daughter encouraged to reach out to Endo at [place] to notify them as well. This RN educated pt on home care, new-worsening symptoms, when to call back/seek emergent care. Pt verbalized understanding and agrees to plan.     Reason for Disposition  Diabetes drug error or overdose (e.g., took wrong type of insulin or took extra dose)  Answer Assessment - Initial Assessment Questions 1. NAME of MEDICINE: "What medicine(s) are you calling about?"     Metformin 2. QUESTION: "What is your question?" (e.g., double dose of medicine, side effect)     Pt is prescribed 2- 2,000 mg tab/ has been getting 4,000 mg tablets for about a week  3. PRESCRIBER: "Who prescribed the medicine?" Reason: if prescribed by specialist, call should be referred to that group.     Endo at [facility]  4. SYMPTOMS: "Do you have any symptoms?" If Yes, ask: "What symptoms are you having?"  "How bad are the symptoms (e.g., mild, moderate, severe) Loss of appetite  Protocols used: Medication Question Call-A-AH

## 2023-11-20 NOTE — Telephone Encounter (Signed)
[place], Camela (daughter) stated her mother gave patient more Metformin than needed and his sugars are now reading low 85-95 range. She would like to know what she could do to bring it up. Callback #: [phone]  1110 attempted to return and left message

## 2023-11-25 ENCOUNTER — Other Ambulatory Visit: Payer: Self-pay | Admitting: Pharmacist

## 2023-11-25 NOTE — Progress Notes (Signed)
 11/25/2023 Name: Ryan Gilmore MRN: [id number] DOB: 10/23/1932  Chief Complaint  Patient presents with   Diabetes    Ryan Gilmore is a 89 y.o. year old male who presented for a telephone visit.   They were referred to the pharmacist by a quality report for assistance in managing diabetes.  Patient was identified as falling into the [place] Measure - Diabetes.   Patient was: Referred to pharmacy for chronic disease management.  Patient also [facility] with the [facility]   Subjective:  Care Team: Primary Care Provider: Donita Brooks, MD  [facility] PCP on 11/27/23  Medication Access/Adherence  Current Pharmacy:  [id number] - [place] ([place] - [address] [place]) [place] [address] Phone: [phone] Fax: [phone]  Patient reports affordability concerns with their medications: No  [facility] Patient reports access/transportation concerns to their pharmacy: No  Patient reports adherence concerns with their medications:  No     Diabetes:  Current medications: jardiance, metformin, glipizide (driven by [facility] formulary) Medications tried in the past:   Current glucose readings: n/a  Current medication access support: [facility]   Objective:  Lab Results  Component Value Date   HGBA1C 9.6 (H) 07/14/2023    Lab Results  Component Value Date   CREATININE 1.35 (H) 07/14/2023   BUN 19 07/14/2023   NA 141 07/14/2023   K 4.4 07/14/2023   CL 102 07/14/2023   CO2 28 07/14/2023    Lab Results  Component Value Date   CHOL 117 07/14/2023   HDL 36 (L) 07/14/2023   LDLCALC 64 07/14/2023   LDLDIRECT 106.9 08/28/2014   TRIG 87 07/14/2023   CHOLHDL 3.3 07/14/2023    Medications Reviewed Today     Reviewed by Ryan Gilmore, [facility] (Pharmacist) on 11/25/23 at [phone]  Med List Status: <None>   Medication Order Taking? Sig Documenting Provider Last Dose Status Informant  aspirin EC 81 MG tablet [id number] No Take 81 mg by mouth  daily. Swallow whole. [provider] Taking Active   Blood Glucose Monitoring Suppl (BLOOD GLUCOSE SYSTEM PAK) KIT [id number] No Please dispense based on patient and insurance preference. Use as directed to monitor FSBS 2x. Dx: E11.E11.65. Ryan Brooks, MD Taking Active Family Member  cephALEXin [facility]) 500 MG capsule [id number] No Take 1 capsule (500 mg total) by mouth 3 (three) times daily. Ryan Brooks, MD Taking Active   empagliflozin (JARDIANCE) 25 MG TABS tablet [id number] No Take 25 mg by mouth daily. [provider] Taking Active   galantamine (RAZADYNE) 12 MG tablet [id number] No Take 12 mg by mouth 2 (two) times daily. [provider] Taking Active   glipiZIDE (GLUCOTROL) 5 MG tablet [id number] No Take by mouth 2 (two) times daily before a meal. [provider] Taking Active   Glucose Blood (BLOOD GLUCOSE TEST STRIPS) STRP [id number] No Please dispense one touch verio flex lancets, and testing strips. Use as directed to monitor FSBS 2x. Dx: E11.E11.65. Ryan Brooks, MD Taking Active Family Member  Lancets MISC [id number] No Please dispense based on patient and insurance preference. Use as directed to monitor FSBS 2x. Dx: E11.E11.65. Ryan Brooks, MD Taking Active Family Member  losartan (COZAAR) 25 MG tablet [id number] No Take 25 mg by mouth daily. [provider] Taking Active   Memantine HCl (NAMENDA PO) [id number] No Take by mouth. [provider] Taking Active   metFORMIN (GLUCOPHAGE) 500 MG tablet [id number] No TAKE 2  TABLETS BY MOUTH TWICE DAILY WITH A MEAL Ryan Brooks, MD Taking Active   Multiple Vitamin (MULTIVITAMIN) tablet [id number] No Take 1 tablet by mouth daily. [provider] Taking Active   pioglitazone (ACTOS) 30 MG tablet [id number] No Take 1 tablet (30 mg total) by mouth daily. Ryan Brooks, MD Taking Active   rosuvastatin (CRESTOR) 40 MG tablet [id number] No Take 40 mg by mouth daily.  [provider] Taking Active              Assessment/Plan:   Spoke with daughter and no acute concerns or needs Patient still not eating great but taking meds as prescribed Daughter will check in with patient later today or tomorrow  Encouraged her to reach out if needs arise  Follow Up Plan: [facility] PCP on Friday, 2/21  Ryan Gilmore, PharmD, BCACP, CPP Clinical Pharmacist, [facility]

## 2023-11-30 ENCOUNTER — Ambulatory Visit (INDEPENDENT_AMBULATORY_CARE_PROVIDER_SITE_OTHER): Payer: No Typology Code available for payment source

## 2023-11-30 DIAGNOSIS — E538 Deficiency of other specified B group vitamins: Secondary | ICD-10-CM

## 2023-11-30 MED ORDER — CYANOCOBALAMIN 1000 MCG/ML IJ SOLN
1000.0000 ug | Freq: Once | INTRAMUSCULAR | Status: AC
  Administered 2023-11-30: 1000 ug via INTRAMUSCULAR

## 2023-11-30 NOTE — Progress Notes (Signed)
 Patient is in office today for a nurse visit for B12 Injection. Patient Injection was given in the  Left deltoid. Patient tolerated injection well.

## 2023-12-03 ENCOUNTER — Encounter: Payer: Self-pay | Admitting: Internal Medicine

## 2023-12-28 ENCOUNTER — Ambulatory Visit (INDEPENDENT_AMBULATORY_CARE_PROVIDER_SITE_OTHER): Payer: No Typology Code available for payment source

## 2023-12-28 DIAGNOSIS — E538 Deficiency of other specified B group vitamins: Secondary | ICD-10-CM | POA: Diagnosis not present

## 2023-12-28 MED ORDER — CYANOCOBALAMIN 1000 MCG/ML IJ SOLN
1000.0000 ug | Freq: Once | INTRAMUSCULAR | Status: AC
  Administered 2023-12-28: 1000 ug via INTRAMUSCULAR

## 2023-12-28 NOTE — Progress Notes (Signed)
 Patient is in office today for a nurse visit for B12 Injection. Patient Injection was given in the  Left deltoid. Patient tolerated injection well.

## 2024-01-27 ENCOUNTER — Other Ambulatory Visit (INDEPENDENT_AMBULATORY_CARE_PROVIDER_SITE_OTHER)

## 2024-01-27 DIAGNOSIS — E538 Deficiency of other specified B group vitamins: Secondary | ICD-10-CM | POA: Diagnosis not present

## 2024-01-27 DIAGNOSIS — I10 Essential (primary) hypertension: Secondary | ICD-10-CM

## 2024-01-27 DIAGNOSIS — D72829 Elevated white blood cell count, unspecified: Secondary | ICD-10-CM

## 2024-01-27 DIAGNOSIS — E118 Type 2 diabetes mellitus with unspecified complications: Secondary | ICD-10-CM

## 2024-01-27 DIAGNOSIS — F0150 Vascular dementia without behavioral disturbance: Secondary | ICD-10-CM

## 2024-01-27 MED ORDER — CYANOCOBALAMIN 1000 MCG/ML IJ SOLN
1000.0000 ug | Freq: Once | INTRAMUSCULAR | Status: AC
  Administered 2024-01-27: 1000 ug via INTRAMUSCULAR

## 2024-01-27 NOTE — Progress Notes (Signed)
 Patient is in office today for a nurse visit for B12 Injection. Patient Injection was given in the  Left deltoid. Patient tolerated injection well.

## 2024-01-28 NOTE — Progress Notes (Signed)
 I have collaborated with the care management provider regarding care management and care coordination activities outlined in this encounter and have reviewed this encounter including documentation in the note and care plan. I am certifying that I agree with the content of this note and encounter as supervising physician.

## 2024-02-03 ENCOUNTER — Ambulatory Visit: Payer: Medicare HMO | Admitting: Podiatry

## 2024-02-03 ENCOUNTER — Encounter: Payer: Self-pay | Admitting: Podiatry

## 2024-02-03 DIAGNOSIS — B351 Tinea unguium: Secondary | ICD-10-CM

## 2024-02-03 DIAGNOSIS — E1151 Type 2 diabetes mellitus with diabetic peripheral angiopathy without gangrene: Secondary | ICD-10-CM

## 2024-02-03 DIAGNOSIS — M79674 Pain in right toe(s): Secondary | ICD-10-CM

## 2024-02-03 DIAGNOSIS — M79675 Pain in left toe(s): Secondary | ICD-10-CM | POA: Diagnosis not present

## 2024-02-08 NOTE — Progress Notes (Signed)
  Subjective:  Patient ID: Ryan Gilmore, male    DOB: [birth year],  MRN: [id number]  Ryan Gilmore presents to clinic today for at risk foot care. Pt has h/o NIDDM with PAD and painful elongated mycotic toenails 1-5 bilaterally which are tender when wearing enclosed shoe gear. Pain is relieved with periodic professional debridement. He is accompanied by his wife and daughter on today's visit.  New problem(s): None.   PCP is Austine Lefort, MD. Holli Lunger 07/14/2023.  No Known Allergies  Review of Systems: Negative except as noted in the HPI.  Objective: No changes noted in today's physical examination. There were no vitals filed for this visit. Ryan Gilmore is a pleasant 89 y.o. male WD, WN in NAD. AAO x 3.  Vascular Examination: CFT <3 seconds b/l. DP pulses faintly palpable b/l. PT pulses nonpalpable b/l. Digital hair absent. Skin temperature gradient warm to warm b/l. No pain with calf compression. No ischemia or gangrene. No cyanosis or clubbing noted b/l.    Neurological Examination: Sensation grossly intact b/l with 10 gram monofilament. Vibratory sensation intact b/l.   Dermatological Examination: Pedal skin warm and supple b/l. No open wounds b/l. No interdigital macerations. Toenails 1-5 b/l thick, discolored, elongated with subungual debris and pain on dorsal palpation.  No corns, calluses nor porokeratotic lesions noted.  Musculoskeletal Examination: Muscle strength 5/5 to all lower extremity muscle groups bilaterally. Hammertoe deformity noted 2-5 b/l.  Radiographs: None  Last HgA1c:      Latest Ref Rng & Units 07/14/2023   12:47 PM  Hemoglobin A1C  Hemoglobin-A1c <5.7 % of total Hgb 9.6    Assessment/Plan: 1. Pain due to onychomycosis of toenails of both feet   2. Type II diabetes mellitus with peripheral circulatory disorder [facility])     Consent given for treatment. Patient examined. All patient's and/or POA's questions/concerns addressed on today's visit. Toenails  1-5 debrided in length and girth without incident. Continue foot and shoe inspections daily. Monitor blood glucose per PCP/Endocrinologist's recommendations. Continue soft, supportive shoe gear daily. Report any pedal injuries to medical professional. Call office if there are any questions/concerns. -Patient/POA to call should there be question/concern in the interim.   Return in about 3 months (around 05/04/2024).  Luella Sager, DPM       LOCATION: [address] [place] [address]                   Office [phone]   [facility] LOCATION: [address] [place], [place] [address] Office [phone]

## 2024-03-01 DIAGNOSIS — R972 Elevated prostate specific antigen [PSA]: Secondary | ICD-10-CM | POA: Diagnosis not present

## 2024-03-02 ENCOUNTER — Ambulatory Visit

## 2024-03-03 ENCOUNTER — Ambulatory Visit (INDEPENDENT_AMBULATORY_CARE_PROVIDER_SITE_OTHER)

## 2024-03-03 VITALS — Ht 71.0 in | Wt 169.0 lb

## 2024-03-03 DIAGNOSIS — E538 Deficiency of other specified B group vitamins: Secondary | ICD-10-CM | POA: Diagnosis not present

## 2024-03-03 MED ORDER — CYANOCOBALAMIN 1000 MCG/ML IJ SOLN
1000.0000 ug | Freq: Once | INTRAMUSCULAR | Status: AC
  Administered 2024-03-03: 1000 ug via INTRAMUSCULAR

## 2024-03-03 NOTE — Addendum Note (Signed)
 Addended by: Nonnie Beagle R on: 03/03/2024 11:01 AM   Modules accepted: Orders

## 2024-03-03 NOTE — Progress Notes (Signed)
 Patient is in office today for a nurse visit for B12 Injection. Patient Injection was given in the  Left arm. Patient tolerated injection well.

## 2024-03-07 DIAGNOSIS — R350 Frequency of micturition: Secondary | ICD-10-CM | POA: Diagnosis not present

## 2024-03-07 DIAGNOSIS — N401 Enlarged prostate with lower urinary tract symptoms: Secondary | ICD-10-CM | POA: Diagnosis not present

## 2024-03-07 DIAGNOSIS — R972 Elevated prostate specific antigen [PSA]: Secondary | ICD-10-CM | POA: Diagnosis not present

## 2024-03-09 ENCOUNTER — Emergency Department (HOSPITAL_COMMUNITY)
Admission: EM | Admit: 2024-03-09 | Discharge: 2024-03-09 | Disposition: A | Discharge status: Home / Self Care | Attending: Emergency Medicine | Admitting: Emergency Medicine

## 2024-03-09 ENCOUNTER — Other Ambulatory Visit: Payer: Self-pay

## 2024-03-09 ENCOUNTER — Emergency Department (HOSPITAL_COMMUNITY)

## 2024-03-09 ENCOUNTER — Ambulatory Visit: Payer: Self-pay

## 2024-03-09 DIAGNOSIS — D72829 Elevated white blood cell count, unspecified: Secondary | ICD-10-CM | POA: Diagnosis not present

## 2024-03-09 DIAGNOSIS — Z794 Long term (current) use of insulin: Secondary | ICD-10-CM | POA: Insufficient documentation

## 2024-03-09 DIAGNOSIS — R739 Hyperglycemia, unspecified: Secondary | ICD-10-CM

## 2024-03-09 DIAGNOSIS — Z7984 Long term (current) use of oral hypoglycemic drugs: Secondary | ICD-10-CM | POA: Insufficient documentation

## 2024-03-09 DIAGNOSIS — E1165 Type 2 diabetes mellitus with hyperglycemia: Secondary | ICD-10-CM | POA: Diagnosis not present

## 2024-03-09 DIAGNOSIS — F0390 Unspecified dementia without behavioral disturbance: Secondary | ICD-10-CM | POA: Insufficient documentation

## 2024-03-09 DIAGNOSIS — Z7982 Long term (current) use of aspirin: Secondary | ICD-10-CM | POA: Insufficient documentation

## 2024-03-09 DIAGNOSIS — Z8546 Personal history of malignant neoplasm of prostate: Secondary | ICD-10-CM | POA: Insufficient documentation

## 2024-03-09 LAB — URINALYSIS, ROUTINE W REFLEX MICROSCOPIC
Bilirubin Urine: NEGATIVE
Glucose, UA: >=500 mg/dL — AB
Hgb urine dipstick: NEGATIVE
Ketones, ur: NEGATIVE mg/dL
Nitrite: NEGATIVE
Protein, ur: NEGATIVE mg/dL
RBC / HPF: >50 RBC/hpf (ref 0–5)
Specific Gravity, Urine: 1.026 (ref 1.005–1.030)
WBC, UA: >50 WBC/hpf (ref 0–5)
pH: 5 (ref 5.0–8.0)

## 2024-03-09 LAB — CBC WITH DIFFERENTIAL/PLATELET
Abs Immature Granulocytes: 0.13 10*3/uL — ABNORMAL HIGH (ref 0.00–0.07)
Basophils Absolute: 0.2 10*3/uL — ABNORMAL HIGH (ref 0.0–0.1)
Basophils Relative: 0 %
Eosinophils Absolute: 0.5 10*3/uL (ref 0.0–0.5)
Eosinophils Relative: 1 %
HCT: 46.2 % (ref 39.0–52.0)
Hemoglobin: 14.8 g/dL (ref 13.0–17.0)
Immature Granulocytes: 0 %
Lymphocytes Relative: 73 %
Lymphs Abs: 26.4 10*3/uL — ABNORMAL HIGH (ref 0.7–4.0)
MCH: 29.8 pg (ref 26.0–34.0)
MCHC: 32 g/dL (ref 30.0–36.0)
MCV: 93 fL (ref 80.0–100.0)
Monocytes Absolute: 1.1 10*3/uL — ABNORMAL HIGH (ref 0.1–1.0)
Monocytes Relative: 3 %
Neutro Abs: 8.5 10*3/uL — ABNORMAL HIGH (ref 1.7–7.7)
Neutrophils Relative %: 23 %
Platelets: 211 10*3/uL (ref 150–400)
RBC: 4.97 MIL/uL (ref 4.22–5.81)
RDW: 14.6 % (ref 11.5–15.5)
WBC: 36.7 10*3/uL — ABNORMAL HIGH (ref 4.0–10.5)
nRBC: 0 % (ref 0.0–0.2)

## 2024-03-09 LAB — COMPREHENSIVE METABOLIC PANEL WITH GFR
ALT: 12 U/L (ref 0–44)
AST: 20 U/L (ref 15–41)
Albumin: 4.3 g/dL (ref 3.5–5.0)
Alkaline Phosphatase: 68 U/L (ref 38–126)
Anion gap: 10 (ref 5–15)
BUN: 24 mg/dL — ABNORMAL HIGH (ref 8–23)
CO2: 24 mmol/L (ref 22–32)
Calcium: 9.4 mg/dL (ref 8.9–10.3)
Chloride: 102 mmol/L (ref 98–111)
Creatinine, Ser: 1.29 mg/dL — ABNORMAL HIGH (ref 0.61–1.24)
GFR, Estimated: 53 mL/min — ABNORMAL LOW (ref >60)
Glucose, Bld: 349 mg/dL — ABNORMAL HIGH (ref 70–99)
Potassium: 4.4 mmol/L (ref 3.5–5.1)
Sodium: 136 mmol/L (ref 135–145)
Total Bilirubin: 0.6 mg/dL (ref 0.0–1.2)
Total Protein: 7.8 g/dL (ref 6.5–8.1)

## 2024-03-09 LAB — BLOOD GAS, VENOUS
Acid-base deficit: 3.2 mmol/L — ABNORMAL HIGH (ref 0.0–2.0)
Bicarbonate: 22.7 mmol/L (ref 20.0–28.0)
O2 Saturation: 60.3 %
Patient temperature: 37
pCO2, Ven: 43 mmHg — ABNORMAL LOW (ref 44–60)
pH, Ven: 7.33 (ref 7.25–7.43)
pO2, Ven: 36 mmHg (ref 32–45)

## 2024-03-09 LAB — BETA-HYDROXYBUTYRIC ACID: Beta-Hydroxybutyric Acid: 0.17 mmol/L (ref 0.05–0.27)

## 2024-03-09 LAB — I-STAT CHEM 8, ED
BUN: 24 mg/dL — ABNORMAL HIGH (ref 8–23)
Calcium, Ion: 1.21 mmol/L (ref 1.15–1.40)
Chloride: 102 mmol/L (ref 98–111)
Creatinine, Ser: 1.2 mg/dL (ref 0.61–1.24)
Glucose, Bld: 347 mg/dL — ABNORMAL HIGH (ref 70–99)
HCT: 46 % (ref 39.0–52.0)
Hemoglobin: 15.6 g/dL (ref 13.0–17.0)
Potassium: 4.3 mmol/L (ref 3.5–5.1)
Sodium: 138 mmol/L (ref 135–145)
TCO2: 23 mmol/L (ref 22–32)

## 2024-03-09 LAB — CBG MONITORING, ED
Glucose-Capillary: 318 mg/dL — ABNORMAL HIGH (ref 70–99)
Glucose-Capillary: 212 mg/dL — ABNORMAL HIGH (ref 70–99)

## 2024-03-09 MED ORDER — SODIUM CHLORIDE 0.9 % IV BOLUS
1000.0000 mL | Freq: Once | INTRAVENOUS | Status: AC
  Administered 2024-03-09: 1000 mL via INTRAVENOUS

## 2024-03-09 NOTE — Discharge Instructions (Signed)
 Other than your blood sugar being elevated your labs look pretty good.  Please follow-up with your family doctor in the office.

## 2024-03-09 NOTE — ED Triage Notes (Signed)
 Pt sent by PCP for cbg of 425. Pt does not take insulin  at home. Glucose 318 in triage.

## 2024-03-09 NOTE — ED Provider Notes (Signed)
 Received patient in turnover from Dr. Martina Sledge.  Please see their note for further details of Hx, PE.  Briefly patient is a 89 y.o. male with a Hyperglycemia .  Patient with hyperglycemia at home.  Family worried about diabetic ketoacidosis.  Initial lab work without DKA.  White blood cell count 36,000.   I reassessed the patient.  I discussed the results with the patient and family.  I asked if there is anything that might of caused this and the family thinks that maybe he missed his medication last night.  No obvious infectious symptoms.  When I mentioned his white blood cell count elevation they said that is consistent with his CLL.  6:00 PM:  I have discussed the diagnosis/risks/treatment options with the patient and family.  Evaluation and diagnostic testing in the emergency department does not suggest an emergent condition requiring admission or immediate intervention beyond what has been performed at this time.  They will follow up with PCP. We also discussed returning to the ED immediately if new or worsening sx occur. We discussed the sx which are most concerning (e.g., sudden worsening pain, fever, inability to tolerate by mouth) that necessitate immediate return. Medications administered to the patient during their visit and any new prescriptions provided to the patient are listed below.  Medications given during this visit Medications  sodium chloride  0.9 % bolus 1,000 mL (1,000 mLs Intravenous New Bag/Given 03/09/24 1523)     The patient appears reasonably screen and/or stabilized for discharge and I doubt any other medical condition or other [facility] requiring further screening, evaluation, or treatment in the ED at this time prior to discharge.          Albertus Hughs, DO 03/09/24 1800

## 2024-03-09 NOTE — Telephone Encounter (Signed)
 FYI Only or Action Required?: Action required by provider  Patient was last seen in primary care on 03/03/2024 by Jenelle Mis, FNP. Called Nurse Triage reporting Hyperglycemia. Symptoms began today. Interventions attempted: BS 420 after breakfast oatmeal, toast, sausage.Nothing. Symptoms are: No symptomsstable.  Triage Disposition: Call PCP Now  Patient/caregiver understands and will follow disposition?: Yes Spoke with Nanette and will send triage note for review, daughter verbalizes understanding.           Copied from CRM [id number]. Topic: Clinical - Red Word Triage >> Mar 09, 2024 12:25 PM Sophia H wrote: Red Word that prompted transfer to Nurse Triage: Patients daughter Adams Adams states that the patients blood sugar is reading 420. Stated "He is fine, I just don't know what to do". Reason for Disposition  Blood glucose > 400 mg/dL (08.6 mmol/L)  Answer Assessment - Initial Assessment Questions 1. BLOOD GLUCOSE: "What is your blood glucose level?"      420 after breakfast 2. ONSET: "When did you check the blood glucose?"     After breakfast 3. USUAL RANGE: "What is your glucose level usually?" (e.g., usual fasting morning value, usual evening value)     120-180 4. KETONES: "Do you check for ketones (urine or blood test strips)?" If Yes, ask: "What does the test show now?"      no 5. TYPE 1 or 2:  "Do you know what type of diabetes you have?"  (e.g., Type 1, Type 2, Gestational; doesn't know)      Type 2 6. INSULIN : "Do you take insulin ?" "What type of insulin (s) do you use? What is the mode of delivery? (syringe, pen; injection or pump)?"      no 7. DIABETES PILLS: "Do you take any pills for your diabetes?" If Yes, ask: "Have you missed taking any pills recently?"     Yes 8. OTHER SYMPTOMS: "Do you have any symptoms?" (e.g., fever, frequent urination, difficulty breathing, dizziness, weakness, vomiting)     no 9. PREGNANCY: "Is there any chance you are pregnant?" "When was  your last menstrual period?"     N/a  Protocols used: Diabetes - High Blood Sugar-A-AH

## 2024-03-09 NOTE — ED Provider Notes (Signed)
 [place] EMERGENCY DEPARTMENT AT [facility] Provider Note  CSN: [id number] Arrival date & time: 03/09/24 1417  Chief Complaint(s) Hyperglycemia  HPI Ryan Gilmore is a 89 y.o. male with past medical history as below, significant for dementia, DM 2, prostate cancer, lymphoma who presents to the ED with complaint of elev glucose  Here w/ daughter and spouse. Pt lives w/ spouse, both pt and spouse w/ dementia. Daughter helps to take care of them. Concern might not have gotten his PM medication yestd. Checked glucose and was >400. Pt himself has no complaints, no increased thirst, polyuria, n/v, no cp or dib. Overall feels "fine." No recent medication changes per daughter  Past Medical History Past Medical History:  Diagnosis Date   Bleeding duodenal ulcer    ?  1980s   Dementia (HCC)    Diabetes mellitus without complication (HCC)    Hyperlipidemia    Patient Active Problem List   Diagnosis Date Noted   UTI (urinary tract infection) 06/12/2022   Prostate cancer (HCC) 06/12/2022   Lymphoma (HCC) 06/12/2022   Hyperglycemia due to diabetes mellitus (HCC) 06/12/2022   DVT, recurrent, lower extremity, chronic, bilateral    Hypomagnesemia    Palliative care by specialist    DNR (do not resuscitate) discussion    Acute hypoxemic respiratory failure (HCC)    Acute on chronic respiratory failure with hypoxia (HCC)    Vascular dementia without behavioral disturbance (HCC)    FTT (failure to thrive) in adult    Acute pulmonary embolism without acute cor pulmonale (HCC) 04/26/2019   Essential hypertension 04/26/2019   AKI (acute kidney injury) (HCC) 04/26/2019   Chronic midline low back pain without sciatica 10/12/2017   Diabetes mellitus type 2, noninsulin dependent (HCC) 06/28/2013   Other and unspecified hyperlipidemia 06/28/2013   Home Medication(s) Prior to Admission medications   Medication Sig Start Date End Date Taking? Authorizing Provider  aspirin EC 81 MG  tablet Take 81 mg by mouth daily. Swallow whole.    [provider]  Blood Glucose Monitoring Suppl (BLOOD GLUCOSE SYSTEM PAK) KIT Please dispense based on patient and insurance preference. Use as directed to monitor FSBS 2x. Dx: E11.E11.65. 03/24/19   Austine Lefort, MD  cephALEXin  (KEFLEX ) 500 MG capsule Take 1 capsule (500 mg total) by mouth 3 (three) times daily. 07/21/23   Austine Lefort, MD  empagliflozin (JARDIANCE) 25 MG TABS tablet Take 25 mg by mouth daily.    [provider]  galantamine (RAZADYNE) 12 MG tablet Take 12 mg by mouth 2 (two) times daily.    [provider]  glipiZIDE  (GLUCOTROL ) 5 MG tablet Take by mouth 2 (two) times daily before a meal.    [provider]  Glucose Blood (BLOOD GLUCOSE TEST STRIPS) STRP Please dispense one touch verio flex lancets, and testing strips. Use as directed to monitor FSBS 2x. Dx: E11.E11.65. 03/30/19   Austine Lefort, MD  Lancets MISC Please dispense based on patient and insurance preference. Use as directed to monitor FSBS 2x. Dx: E11.E11.65. 03/24/19   Austine Lefort, MD  losartan (COZAAR) 25 MG tablet Take 25 mg by mouth daily.    [provider]  Memantine HCl (NAMENDA PO) Take by mouth.    [provider]  metFORMIN  (GLUCOPHAGE ) 500 MG tablet TAKE 2 TABLETS BY MOUTH TWICE DAILY WITH A MEAL 05/09/19   Austine Lefort, MD  Multiple Vitamin (MULTIVITAMIN) tablet Take 1 tablet by mouth daily.  [provider]  pioglitazone  (ACTOS ) 30 MG tablet Take 1 tablet (30 mg total) by mouth daily. 06/30/23   Austine Lefort, MD  rosuvastatin (CRESTOR) 40 MG tablet Take 40 mg by mouth daily.    [provider]                                                                                                                                    Past Surgical History Past Surgical History:  Procedure Laterality Date   APPENDECTOMY     Family History Family History  Problem  Relation Age of Onset   Heart disease Father    Breast cancer Sister    Breast cancer Sister    Diabetes Brother     Social History Social History   Tobacco Use   Smoking status: Never   Smokeless tobacco: Former    Types: Engineer, drilling   Vaping status: Never Used  Substance Use Topics   Alcohol use: Not Currently   Drug use: Never   Allergies Patient has no known allergies.  Review of Systems A thorough review of systems was obtained and all systems are negative except as noted in the HPI and PMH.   Physical Exam Vital Signs  I have reviewed the triage vital signs BP 118/67 (BP Location: Left Arm)   Pulse 77   Temp 98.6 F (37 C) (Oral)   Resp 16   SpO2 98%  Physical Exam Vitals and nursing note reviewed.  Constitutional:      General: He is not in acute distress.    Appearance: Normal appearance. He is well-developed. He is not ill-appearing.  HENT:     Head: Normocephalic and atraumatic.     Right Ear: External ear normal.     Left Ear: External ear normal.     Nose: Nose normal.     Mouth/Throat:     Mouth: Mucous membranes are dry.  Eyes:     General: No scleral icterus.       Right eye: No discharge.        Left eye: No discharge.  Cardiovascular:     Rate and Rhythm: Normal rate and regular rhythm.     Pulses: Normal pulses.     Heart sounds: Normal heart sounds.  Pulmonary:     Effort: Pulmonary effort is normal. No respiratory distress.     Breath sounds: Normal breath sounds. No stridor.  Abdominal:     General: Abdomen is flat. There is no distension.     Tenderness: There is no guarding.  Musculoskeletal:        General: No deformity.     Cervical back: No rigidity.  Skin:    General: Skin is warm and dry.     Coloration: Skin is not cyanotic, jaundiced or pale.  Neurological:     Mental Status: He is alert and oriented to person, place, and  time.     GCS: GCS eye subscore is 4. GCS verbal subscore is 5. GCS motor subscore is 6.   Psychiatric:        Speech: Speech normal.        Behavior: Behavior normal. Behavior is cooperative.     ED Results and Treatments Labs (all labs ordered are listed, but only abnormal results are displayed) Labs Reviewed  CBC WITH DIFFERENTIAL/PLATELET - Abnormal; Notable for the following components:      Result Value   WBC 36.7 (*)    All other components within normal limits  CBG MONITORING, ED - Abnormal; Notable for the following components:   Glucose-Capillary 318 (*)    All other components within normal limits  I-STAT CHEM 8, ED - Abnormal; Notable for the following components:   BUN 24 (*)    Glucose, Bld 347 (*)    All other components within normal limits  URINALYSIS, ROUTINE W REFLEX MICROSCOPIC  COMPREHENSIVE METABOLIC PANEL WITH GFR  BETA-HYDROXYBUTYRIC ACID  BLOOD GAS, VENOUS  CBG MONITORING, ED                                                                                                                          Radiology No results found.  Pertinent labs & imaging results that were available during my care of the patient were reviewed by me and considered in my medical decision making (see MDM for details).  Medications Ordered in ED Medications  sodium chloride  0.9 % bolus 1,000 mL (has no administration in time range)                                                                                                                                     Procedures Procedures  (including critical care time)  Medical Decision Making / ED Course    Medical Decision Making:    JAYDIN BONIFACE is a 89 y.o. male with past medical history as below, significant for dementia, DM 2, prostate cancer, lymphoma who presents to the ED with complaint of elev glucose. The complaint involves an extensive differential diagnosis and also carries with it a high risk of complications and morbidity.  Serious etiology was considered. Ddx includes but is not limited to: dka,  hhs, hyperglycemia, euglycemic dka, etc  Complete initial physical exam performed, notably the patient was in no distress, hds.    Reviewed  and confirmed nursing documentation for past medical history, family history, social history.  Vital signs reviewed.     Brief summary:  89 yo male, hx dementia, dm2 here with elev glucose Pt has no complaints He looks dry on exam, HDS Glucose 318 in triage Give fluids, check labs He is overall well appearing      Clinical Course as of 03/09/24 1449  Wed Mar 09, 2024  1449 WBC(!): 36.7 Hx lymphoma [SG]    Clinical Course User Index [SG] Teddi Favors, DO     Handoff incoming EDP pending labs and recheck            Additional history obtained: -Additional history obtained from family -External records from outside source obtained and reviewed including: Chart review including previous notes, labs, imaging, consultation notes including  Home meds Primary care documentation   Lab Tests: -I ordered, reviewed, and interpreted labs.   The pertinent results include:   Labs Reviewed  CBC WITH DIFFERENTIAL/PLATELET - Abnormal; Notable for the following components:      Result Value   WBC 36.7 (*)    All other components within normal limits  CBG MONITORING, ED - Abnormal; Notable for the following components:   Glucose-Capillary 318 (*)    All other components within normal limits  I-STAT CHEM 8, ED - Abnormal; Notable for the following components:   BUN 24 (*)    Glucose, Bld 347 (*)    All other components within normal limits  URINALYSIS, ROUTINE W REFLEX MICROSCOPIC  COMPREHENSIVE METABOLIC PANEL WITH GFR  BETA-HYDROXYBUTYRIC ACID  BLOOD GAS, VENOUS  CBG MONITORING, ED    Notable for glucose elev, wbc elev  EKG   EKG Interpretation Date/Time:    Ventricular Rate:    PR Interval:    QRS Duration:    QT Interval:    QTC Calculation:   R Axis:      Text Interpretation:           Imaging Studies  ordered: na   Medicines ordered and prescription drug management: Meds ordered this encounter  Medications   sodium chloride  0.9 % bolus 1,000 mL    -I have reviewed the patients home medicines and have made adjustments as needed   Consultations Obtained: na   Cardiac Monitoring: Continuous pulse oximetry interpreted by myself, 98% on RA.    Social Determinants of Health:  Diagnosis or treatment significantly limited by social determinants of health: former tobacco use   Reevaluation: After the interventions noted above, I reevaluated the patient and found that they have stayed the same  Co morbidities that complicate the patient evaluation  Past Medical History:  Diagnosis Date   Bleeding duodenal ulcer    ?  1980s   Dementia (HCC)    Diabetes mellitus without complication (HCC)    Hyperlipidemia       Dispostion: Disposition decision including need for hospitalization was considered, and patient disposition pending at time of sign out.    Final Clinical Impression(s) / ED Diagnoses Final diagnoses:  Hyperglycemia        Teddi Favors, DO 03/09/24 1449

## 2024-03-10 LAB — PATHOLOGIST SMEAR REVIEW

## 2024-03-24 ENCOUNTER — Encounter: Payer: Self-pay | Admitting: Family Medicine

## 2024-03-24 ENCOUNTER — Ambulatory Visit: Admitting: Family Medicine

## 2024-03-24 VITALS — BP 124/64 | HR 77 | Temp 98.4°F | Ht 71.0 in | Wt 166.5 lb

## 2024-03-24 DIAGNOSIS — E11649 Type 2 diabetes mellitus with hypoglycemia without coma: Secondary | ICD-10-CM | POA: Diagnosis not present

## 2024-03-24 DIAGNOSIS — Z7984 Long term (current) use of oral hypoglycemic drugs: Secondary | ICD-10-CM

## 2024-03-24 DIAGNOSIS — E1165 Type 2 diabetes mellitus with hyperglycemia: Secondary | ICD-10-CM

## 2024-03-24 NOTE — Progress Notes (Signed)
 Subjective:    Patient ID: Ryan Gilmore, male    DOB: 02-18-1933, 89 y.o.   MRN: [id number]  HPI Patient is a very pleasant 89 year old African-American gentleman with end-stage dementia.  He lives with his wife who has mild to moderate dementia.  Daughter recently happened to check his blood sugar and it was well above 100.  She became concerned and took him to the emergency room.  She believes that her mother may have forgotten to give him his medicines for 2 or 3 days.  She states that since they have been taking the medication his sugar is typically around 160.  Previously he has been on the GLP-1 Ozempic and had significant weight loss due to appetite suppression.  Daughter is concerned about insulin  due to mom's dementia and the possibility of her administering insulin  incorrectly.  From a physical standpoint the patient is doing quite well.  He denies any chest pain or shortness of breath or abdominal pain. Past Medical History:  Diagnosis Date   Bleeding duodenal ulcer    ?  1980s   Dementia (HCC)    Diabetes mellitus without complication (HCC)    Hyperlipidemia    Past Surgical History:  Procedure Laterality Date   APPENDECTOMY     Current Outpatient Medications on File Prior to Visit  Medication Sig Dispense Refill   aspirin EC 81 MG tablet Take 81 mg by mouth daily. Swallow whole.     Blood Glucose Monitoring Suppl (BLOOD GLUCOSE SYSTEM PAK) KIT Please dispense based on patient and insurance preference. Use as directed to monitor FSBS 2x. Dx: E11.E11.65. 1 kit 1   cephALEXin  (KEFLEX ) 500 MG capsule Take 1 capsule (500 mg total) by mouth 3 (three) times daily. 21 capsule 0   empagliflozin (JARDIANCE) 25 MG TABS tablet Take 25 mg by mouth daily.     galantamine (RAZADYNE) 12 MG tablet Take 12 mg by mouth 2 (two) times daily.     glipiZIDE  (GLUCOTROL ) 5 MG tablet Take by mouth 2 (two) times daily before a meal.     Glucose Blood (BLOOD GLUCOSE TEST STRIPS) STRP Please dispense  one touch verio flex lancets, and testing strips. Use as directed to monitor FSBS 2x. Dx: E11.E11.65. 100 strip 11   Lancets MISC Please dispense based on patient and insurance preference. Use as directed to monitor FSBS 2x. Dx: E11.E11.65. 100 each 3   losartan (COZAAR) 25 MG tablet Take 25 mg by mouth daily.     Memantine HCl (NAMENDA PO) Take by mouth.     metFORMIN  (GLUCOPHAGE ) 500 MG tablet TAKE 2 TABLETS BY MOUTH TWICE DAILY WITH A MEAL 360 tablet 1   Multiple Vitamin (MULTIVITAMIN) tablet Take 1 tablet by mouth daily.     pioglitazone  (ACTOS ) 30 MG tablet Take 1 tablet (30 mg total) by mouth daily. 60 tablet 0   rosuvastatin (CRESTOR) 40 MG tablet Take 40 mg by mouth daily.     No current facility-administered medications on file prior to visit.     No Known Allergies Social History   Socioeconomic History   Marital status: Married    Spouse name: Valeria Gates   Number of children: 3   Years of education: Not on file   Highest education level: Not on file  Occupational History   Not on file  Tobacco Use   Smoking status: Never   Smokeless tobacco: Former    Types: Engineer, drilling   Vaping status: Never Used  Substance  and Sexual Activity   Alcohol use: Not Currently   Drug use: Never   Sexual activity: Not Currently  Other Topics Concern   Not on file  Social History Narrative   2 daughters, 1 son. Daughter, Cam is POA.    Social Drivers of Corporate investment banker Strain: Low Risk  (07/16/2023)   Overall Financial Resource Strain (CARDIA)    Difficulty of Paying Living Expenses: Not hard at all  Food Insecurity: No Food Insecurity (07/16/2023)   Hunger Vital Sign    Worried About Running Out of Food in the Last Year: Never true    Ran Out of Food in the Last Year: Never true  Transportation Needs: No Transportation Needs (07/16/2023)   PRAPARE - Administrator, Civil Service (Medical): No    Lack of Transportation (Non-Medical): No  Physical  Activity: Inactive (07/16/2023)   Exercise Vital Sign    Days of Exercise per Week: 0 days    Minutes of Exercise per Session: 0 min  Stress: No Stress Concern Present (07/16/2023)   [employer] of Occupational Health - Occupational Stress Questionnaire    Feeling of Stress : Not at all  Social Connections: Socially Integrated (07/16/2023)   Social Connection and Isolation Panel    Frequency of Communication with Friends and Family: More than three times a week    Frequency of Social Gatherings with Friends and Family: Three times a week    Attends Religious Services: 1 to 4 times per year    Active Member of Clubs or Organizations: Yes    Attends Banker Meetings: 1 to 4 times per year    Marital Status: Married  Catering manager Violence: Not At Risk (07/16/2023)   Humiliation, Afraid, Rape, and Kick questionnaire    Fear of Current or Ex-Partner: No    Emotionally Abused: No    Physically Abused: No    Sexually Abused: No     Review of Systems     Objective:   Physical Exam Vitals reviewed.  Constitutional:      Appearance: He is underweight. He is not ill-appearing or toxic-appearing.  HENT:     Mouth/Throat:     Mouth: Mucous membranes are moist.   Eyes:     Extraocular Movements: Extraocular movements intact.     Conjunctiva/sclera: Conjunctivae normal.     Pupils: Pupils are equal, round, and reactive to light.   Neck:     Vascular: No carotid bruit.   Cardiovascular:     Rate and Rhythm: Normal rate and regular rhythm.     Pulses: Normal pulses.     Heart sounds: Normal heart sounds. No murmur heard.    No friction rub. No gallop.  Pulmonary:     Effort: Pulmonary effort is normal. No respiratory distress.     Breath sounds: Normal breath sounds. No stridor. No wheezing, rhonchi or rales.  Abdominal:     General: Abdomen is flat. Bowel sounds are normal. There is no distension.     Palpations: Abdomen is soft.     Tenderness: There is  no abdominal tenderness.   Musculoskeletal:     Cervical back: Neck supple.   Skin:    Findings: No bruising.   Neurological:     General: No focal deficit present.     Mental Status: Mental status is at baseline. He is disoriented.     Cranial Nerves: No cranial nerve deficit.     Sensory: No sensory  deficit.     Motor: No weakness.   Psychiatric:        Mood and Affect: Mood normal.           Assessment & Plan:  uncontrolled diabetes - Plan: Hemoglobin A1c  Type 2 diabetes mellitus with hyperglycemia, unspecified whether long term insulin  use (HCC) - Plan: Hemoglobin A1c This is a difficult situation due to the patient's dementia and his wife's dementia.  I would be very happy with a hemoglobin A1c around 8.  If his hemoglobin A1c is higher than 9, I feel the family has 2 choices.  They can choose to try insulin  or Ozempic.  I would start insulin  at 10 units daily however they would have to monitor sugar closely and I do not believe that his wife is able to do this safely.  The other option would be to resume Ozempic and monitor his appetite closely.  Await the results of his A1c and then the family will make a decision.

## 2024-03-25 ENCOUNTER — Ambulatory Visit: Payer: Self-pay | Admitting: Family Medicine

## 2024-03-25 LAB — HEMOGLOBIN A1C
Hgb A1c MFr Bld: 10 % — ABNORMAL HIGH (ref <5.7)
Mean Plasma Glucose: 240 mg/dL
eAG (mmol/L): 13.3 mmol/L

## 2024-03-30 ENCOUNTER — Ambulatory Visit

## 2024-04-01 ENCOUNTER — Ambulatory Visit (INDEPENDENT_AMBULATORY_CARE_PROVIDER_SITE_OTHER)

## 2024-04-01 DIAGNOSIS — E538 Deficiency of other specified B group vitamins: Secondary | ICD-10-CM | POA: Diagnosis not present

## 2024-04-01 MED ORDER — CYANOCOBALAMIN 1000 MCG/ML IJ SOLN
1000.0000 ug | Freq: Once | INTRAMUSCULAR | Status: AC
  Administered 2024-04-01: 1000 ug via INTRAMUSCULAR

## 2024-04-01 NOTE — Progress Notes (Signed)
 Patient is in office today for a nurse visit for B12 Injection. Patient Injection was given in the  Left deltoid. Patient tolerated injection well.  Vallerie Gave, CMA

## 2024-04-05 NOTE — Progress Notes (Signed)
 Third attempt. No answer . Lvm for call back .

## 2024-04-27 ENCOUNTER — Ambulatory Visit

## 2024-05-04 ENCOUNTER — Ambulatory Visit

## 2024-05-05 ENCOUNTER — Ambulatory Visit (INDEPENDENT_AMBULATORY_CARE_PROVIDER_SITE_OTHER)

## 2024-05-05 DIAGNOSIS — E538 Deficiency of other specified B group vitamins: Secondary | ICD-10-CM | POA: Diagnosis not present

## 2024-05-05 MED ORDER — CYANOCOBALAMIN 1000 MCG/ML IJ SOLN
1000.0000 ug | Freq: Once | INTRAMUSCULAR | Status: AC
  Administered 2024-05-05: 1000 ug via INTRAMUSCULAR

## 2024-05-05 NOTE — Progress Notes (Signed)
 Patient is in office today for a nurse visit for B12 Injection. Patient Injection was given in the  Right deltoid. Patient tolerated injection well.  Renee Pain, CMA

## 2024-05-10 ENCOUNTER — Ambulatory Visit (INDEPENDENT_AMBULATORY_CARE_PROVIDER_SITE_OTHER): Admitting: Podiatry

## 2024-05-10 ENCOUNTER — Encounter: Payer: Self-pay | Admitting: Podiatry

## 2024-05-10 DIAGNOSIS — E1151 Type 2 diabetes mellitus with diabetic peripheral angiopathy without gangrene: Secondary | ICD-10-CM

## 2024-05-10 DIAGNOSIS — B351 Tinea unguium: Secondary | ICD-10-CM

## 2024-05-10 DIAGNOSIS — M79674 Pain in right toe(s): Secondary | ICD-10-CM

## 2024-05-10 DIAGNOSIS — M79675 Pain in left toe(s): Secondary | ICD-10-CM

## 2024-05-11 ENCOUNTER — Ambulatory Visit: Admitting: Podiatry

## 2024-05-15 NOTE — Progress Notes (Signed)
  Subjective:  Patient ID: Lytle LELON Public, male    DOB: 28-May-1933,  MRN: [id number]  CHAEL URENDA presents to clinic today for at risk foot care. Pt has h/o NIDDM with PAD and painful thick toenails that are difficult to trim. Pain interferes with ambulation. Aggravating factors include wearing enclosed shoe gear. Pain is relieved with periodic professional debridement. He is accompanied by his wife and daughter on today's visit. Chief Complaint  Patient presents with   Nail Problem    Thick painful toenails, 3 month follow up    New problem(s): None.   PCP is Duanne Butler DASEN, MD. ARNETTA 03/24/2024.  No Known Allergies  Review of Systems: Negative except as noted in the HPI.  Objective: No changes noted in today's physical examination. There were no vitals filed for this visit. BRENNAN LITZINGER is a pleasant 89 y.o. male WD, WN in NAD. AAO x 3.  Vascular Examination: CFT <3 seconds b/l. DP pulses faintly palpable b/l. PT pulses nonpalpable b/l. Digital hair absent. Skin temperature gradient warm to warm b/l. No pain with calf compression. No ischemia or gangrene. No cyanosis or clubbing noted b/l. No edema noted b/l LE.   Neurological Examination: Sensation grossly intact b/l with 10 gram monofilament. Vibratory sensation intact b/l.   Dermatological Examination: Pedal skin warm and supple b/l. No open wounds b/l. No interdigital macerations. Toenails 1-5 b/l thick, discolored, elongated with subungual debris and pain on dorsal palpation. No hyperkeratotic nor porokeratotic lesions.  Musculoskeletal Examination: Muscle strength 5/5 to all lower extremity muscle groups bilaterally. Hammertoe(s) 2-5 b/l.  Radiographs: None  Last HgA1c:      Latest Ref Rng & Units 03/24/2024   10:11 AM 07/14/2023   12:47 PM  Hemoglobin A1C  Hemoglobin-A1c <5.7 % 10.0  9.6    Assessment/Plan: 1. Pain due to onychomycosis of toenails of both feet   2. Type II diabetes mellitus with peripheral  circulatory disorder [facility])   Patient was evaluated and treated. All patient's and/or POA's questions/concerns addressed on today's visit. Mycotic toenails 1-5 debrided in length and girth without incident. Continue soft, supportive shoe gear daily. Report any pedal injuries to medical professional. Call office if there are any quesitons/concerns. -Patient/POA to call should there be question/concern in the interim.   Return in about 3 months (around 08/10/2024).  Delon LITTIE Merlin, DPM      [place] LOCATION: [address], [place] [address]                   Office [phone]   [facility] LOCATION: [address] [place], [place] [address] Office [phone]

## 2024-06-02 ENCOUNTER — Ambulatory Visit (INDEPENDENT_AMBULATORY_CARE_PROVIDER_SITE_OTHER): Admitting: Family Medicine

## 2024-06-02 DIAGNOSIS — E538 Deficiency of other specified B group vitamins: Secondary | ICD-10-CM | POA: Diagnosis not present

## 2024-06-02 MED ORDER — CYANOCOBALAMIN 1000 MCG/ML IJ SOLN
1000.0000 ug | Freq: Once | INTRAMUSCULAR | Status: AC
  Administered 2024-06-02: 1000 ug via INTRAMUSCULAR

## 2024-06-02 NOTE — Progress Notes (Signed)
 Patient is in office today for a nurse visit for B12 Injection. Patient Injection was given in the  Left deltoid. Patient tolerated injection well.

## 2024-07-04 ENCOUNTER — Ambulatory Visit

## 2024-07-05 ENCOUNTER — Ambulatory Visit

## 2024-07-26 DIAGNOSIS — Z7984 Long term (current) use of oral hypoglycemic drugs: Secondary | ICD-10-CM | POA: Diagnosis not present

## 2024-07-26 DIAGNOSIS — R269 Unspecified abnormalities of gait and mobility: Secondary | ICD-10-CM | POA: Diagnosis not present

## 2024-07-26 DIAGNOSIS — F0150 Vascular dementia without behavioral disturbance: Secondary | ICD-10-CM | POA: Diagnosis not present

## 2024-07-26 DIAGNOSIS — Z8572 Personal history of non-Hodgkin lymphomas: Secondary | ICD-10-CM | POA: Diagnosis not present

## 2024-07-26 DIAGNOSIS — E1122 Type 2 diabetes mellitus with diabetic chronic kidney disease: Secondary | ICD-10-CM | POA: Diagnosis not present

## 2024-07-26 DIAGNOSIS — G309 Alzheimer's disease, unspecified: Secondary | ICD-10-CM | POA: Diagnosis not present

## 2024-07-26 DIAGNOSIS — N400 Enlarged prostate without lower urinary tract symptoms: Secondary | ICD-10-CM | POA: Diagnosis not present

## 2024-07-26 DIAGNOSIS — I129 Hypertensive chronic kidney disease with stage 1 through stage 4 chronic kidney disease, or unspecified chronic kidney disease: Secondary | ICD-10-CM | POA: Diagnosis not present

## 2024-07-26 DIAGNOSIS — Z7982 Long term (current) use of aspirin: Secondary | ICD-10-CM | POA: Diagnosis not present

## 2024-07-26 DIAGNOSIS — N1831 Chronic kidney disease, stage 3a: Secondary | ICD-10-CM | POA: Diagnosis not present

## 2024-07-26 DIAGNOSIS — E785 Hyperlipidemia, unspecified: Secondary | ICD-10-CM | POA: Diagnosis not present

## 2024-08-24 ENCOUNTER — Encounter: Payer: Self-pay | Admitting: Podiatry

## 2024-08-24 ENCOUNTER — Ambulatory Visit (INDEPENDENT_AMBULATORY_CARE_PROVIDER_SITE_OTHER): Admitting: Podiatry

## 2024-08-24 DIAGNOSIS — M2042 Other hammer toe(s) (acquired), left foot: Secondary | ICD-10-CM

## 2024-08-24 DIAGNOSIS — B351 Tinea unguium: Secondary | ICD-10-CM | POA: Diagnosis not present

## 2024-08-24 DIAGNOSIS — Z0189 Encounter for other specified special examinations: Secondary | ICD-10-CM | POA: Diagnosis not present

## 2024-08-24 DIAGNOSIS — M79675 Pain in left toe(s): Secondary | ICD-10-CM

## 2024-08-24 DIAGNOSIS — E1151 Type 2 diabetes mellitus with diabetic peripheral angiopathy without gangrene: Secondary | ICD-10-CM

## 2024-08-24 DIAGNOSIS — M79674 Pain in right toe(s): Secondary | ICD-10-CM | POA: Diagnosis not present

## 2024-08-24 DIAGNOSIS — M2041 Other hammer toe(s) (acquired), right foot: Secondary | ICD-10-CM | POA: Diagnosis not present

## 2024-08-24 DIAGNOSIS — E119 Type 2 diabetes mellitus without complications: Secondary | ICD-10-CM

## 2024-09-02 ENCOUNTER — Encounter: Payer: Self-pay | Admitting: Podiatry

## 2024-09-02 NOTE — Progress Notes (Signed)
  Subjective:  Patient ID: Ryan Gilmore, male    DOB: [birth year],  MRN: [id number]  Ryan Gilmore presents to clinic today for for annual diabetic foot examination and painful mycotic toenails of both feet that are difficult to trim. Pain interferes with daily activities and wearing enclosed shoe gear comfortably. He is accompanied by his wife and daughter on today's visit. Chief Complaint  Patient presents with   Diabetes    DFC NIDDM A1C 10.0. Toenail trim. LOV with PCP 06/02/24.   New problem(s): None.   PCP is Duanne Butler DASEN, MD.  No Known Allergies  Review of Systems: Negative except as noted in the HPI.  Objective: No changes noted in today's physical examination. There were no vitals filed for this visit. Ryan Gilmore is a pleasant 89 y.o. male in NAD. AAO x 3.   Diabetic foot exam was performed with the following findings:   Vascular Examination: CFT <3 seconds b/l. DP pulses faintly palpable b/l. PT pulses nonpalpable b/l. Digital hair absent. Skin temperature gradient warm to warm b/l. No pain with calf compression. No ischemia or gangrene. No cyanosis or clubbing noted b/l.    Neurological Examination: Sensation grossly intact b/l with 10 gram monofilament. Vibratory sensation intact b/l.   Dermatological Examination: Pedal skin warm and supple b/l. No open wounds b/l. No interdigital macerations. Toenails 1-5 b/l thick, discolored, elongated with subungual debris and pain on dorsal palpation.   No corns, calluses, nor porokeratotic lesions.   Musculoskeletal Examination: Muscle strength 5/5 to all lower extremity muscle groups bilaterally. Hammertoe(s) 2-5 b/l.  Radiographs: None     Assessment/Plan: 1. Pain due to onychomycosis of toenails of both feet   2. Acquired hammertoes of both feet   3. Type II diabetes mellitus with peripheral circulatory disorder (HCC)   4. Encounter for diabetic foot exam (HCC)   Diabetic foot examination performed today. All  patient's and/or POA's questions/concerns addressed on today's visit. Toenails 1-5 b/l debrided in length and girth without incident. Continue foot and shoe inspections daily. Monitor blood glucose per PCP/Endocrinologist's recommendations. Continue soft, supportive shoe gear daily. Report any pedal injuries to medical professional. Call office if there are any questions/concerns. -Patient/POA to call should there be question/concern in the interim.   Return in about 3 months (around 11/24/2024).  Ryan Gilmore, DPM      [place] LOCATION: [address] [place] [address]                   Office [phone]   [facility] LOCATION: [address] [place], [place] [address] Office [phone]

## 2024-09-09 ENCOUNTER — Telehealth: Payer: Self-pay

## 2024-09-09 DIAGNOSIS — E1165 Type 2 diabetes mellitus with hyperglycemia: Secondary | ICD-10-CM

## 2024-09-09 NOTE — Telephone Encounter (Signed)
 Patient was identified as falling into the [place] Measure - Diabetes.   Patient was: Appointment scheduled for lab or office visit for A1c.

## 2024-09-12 ENCOUNTER — Other Ambulatory Visit

## 2024-09-30 DIAGNOSIS — W19XXXA Unspecified fall, initial encounter: Secondary | ICD-10-CM

## 2024-09-30 HISTORY — DX: Unspecified fall, initial encounter: W19.XXXA

## 2024-10-01 ENCOUNTER — Emergency Department (HOSPITAL_COMMUNITY)

## 2024-10-01 ENCOUNTER — Inpatient Hospital Stay (HOSPITAL_COMMUNITY)
Admission: EM | Admit: 2024-10-01 | Discharge: 2024-10-06 | DRG: 689 | Disposition: A | Discharge status: Home Health | Attending: Internal Medicine | Admitting: Internal Medicine

## 2024-10-01 DIAGNOSIS — R338 Other retention of urine: Secondary | ICD-10-CM | POA: Insufficient documentation

## 2024-10-01 DIAGNOSIS — Z803 Family history of malignant neoplasm of breast: Secondary | ICD-10-CM | POA: Diagnosis not present

## 2024-10-01 DIAGNOSIS — N179 Acute kidney failure, unspecified: Secondary | ICD-10-CM | POA: Diagnosis present

## 2024-10-01 DIAGNOSIS — N3001 Acute cystitis with hematuria: Secondary | ICD-10-CM | POA: Diagnosis not present

## 2024-10-01 DIAGNOSIS — N390 Urinary tract infection, site not specified: Secondary | ICD-10-CM | POA: Diagnosis present

## 2024-10-01 DIAGNOSIS — L89302 Pressure ulcer of unspecified buttock, stage 2: Secondary | ICD-10-CM | POA: Diagnosis present

## 2024-10-01 DIAGNOSIS — U071 COVID-19: Secondary | ICD-10-CM | POA: Diagnosis not present

## 2024-10-01 DIAGNOSIS — E1159 Type 2 diabetes mellitus with other circulatory complications: Secondary | ICD-10-CM | POA: Diagnosis present

## 2024-10-01 DIAGNOSIS — B952 Enterococcus as the cause of diseases classified elsewhere: Secondary | ICD-10-CM | POA: Diagnosis present

## 2024-10-01 DIAGNOSIS — Z87440 Personal history of urinary (tract) infections: Secondary | ICD-10-CM

## 2024-10-01 DIAGNOSIS — E1165 Type 2 diabetes mellitus with hyperglycemia: Secondary | ICD-10-CM | POA: Diagnosis present

## 2024-10-01 DIAGNOSIS — Z7984 Long term (current) use of oral hypoglycemic drugs: Secondary | ICD-10-CM

## 2024-10-01 DIAGNOSIS — Z8249 Family history of ischemic heart disease and other diseases of the circulatory system: Secondary | ICD-10-CM

## 2024-10-01 DIAGNOSIS — N3000 Acute cystitis without hematuria: Secondary | ICD-10-CM | POA: Diagnosis present

## 2024-10-01 DIAGNOSIS — R531 Weakness: Secondary | ICD-10-CM | POA: Diagnosis not present

## 2024-10-01 DIAGNOSIS — E119 Type 2 diabetes mellitus without complications: Secondary | ICD-10-CM

## 2024-10-01 DIAGNOSIS — F01C0 Vascular dementia, severe, without behavioral disturbance, psychotic disturbance, mood disturbance, and anxiety: Secondary | ICD-10-CM | POA: Diagnosis not present

## 2024-10-01 DIAGNOSIS — Z833 Family history of diabetes mellitus: Secondary | ICD-10-CM | POA: Diagnosis not present

## 2024-10-01 DIAGNOSIS — W19XXXA Unspecified fall, initial encounter: Principal | ICD-10-CM

## 2024-10-01 DIAGNOSIS — Z87891 Personal history of nicotine dependence: Secondary | ICD-10-CM | POA: Diagnosis not present

## 2024-10-01 DIAGNOSIS — L89152 Pressure ulcer of sacral region, stage 2: Secondary | ICD-10-CM | POA: Diagnosis present

## 2024-10-01 DIAGNOSIS — I10 Essential (primary) hypertension: Secondary | ICD-10-CM | POA: Diagnosis present

## 2024-10-01 DIAGNOSIS — C8590 Non-Hodgkin lymphoma, unspecified, unspecified site: Secondary | ICD-10-CM | POA: Diagnosis present

## 2024-10-01 DIAGNOSIS — L8990 Pressure ulcer of unspecified site, unspecified stage: Secondary | ICD-10-CM | POA: Diagnosis present

## 2024-10-01 DIAGNOSIS — E782 Mixed hyperlipidemia: Secondary | ICD-10-CM | POA: Diagnosis present

## 2024-10-01 DIAGNOSIS — Z7982 Long term (current) use of aspirin: Secondary | ICD-10-CM | POA: Diagnosis not present

## 2024-10-01 DIAGNOSIS — Y92009 Unspecified place in unspecified non-institutional (private) residence as the place of occurrence of the external cause: Secondary | ICD-10-CM | POA: Diagnosis not present

## 2024-10-01 LAB — COMPREHENSIVE METABOLIC PANEL WITH GFR
ALT: <5 U/L (ref 0–44)
AST: 21 U/L (ref 15–41)
Albumin: 4.2 g/dL (ref 3.5–5.0)
Alkaline Phosphatase: 77 U/L (ref 38–126)
Anion gap: 10 (ref 5–15)
BUN: 16 mg/dL (ref 8–23)
CO2: 27 mmol/L (ref 22–32)
Calcium: 9.6 mg/dL (ref 8.9–10.3)
Chloride: 99 mmol/L (ref 98–111)
Creatinine, Ser: 1.21 mg/dL (ref 0.61–1.24)
GFR, Estimated: 57 mL/min — ABNORMAL LOW
Glucose, Bld: 361 mg/dL — ABNORMAL HIGH (ref 70–99)
Potassium: 4.6 mmol/L (ref 3.5–5.1)
Sodium: 137 mmol/L (ref 135–145)
Total Bilirubin: 0.4 mg/dL (ref 0.0–1.2)
Total Protein: 7 g/dL (ref 6.5–8.1)

## 2024-10-01 LAB — CBG MONITORING, ED
Glucose-Capillary: 334 mg/dL — ABNORMAL HIGH (ref 70–99)
Glucose-Capillary: 253 mg/dL — ABNORMAL HIGH (ref 70–99)

## 2024-10-01 LAB — CBC WITH DIFFERENTIAL/PLATELET
Abs Immature Granulocytes: 0.1 K/uL — ABNORMAL HIGH (ref 0.00–0.07)
Basophils Absolute: 0.1 K/uL (ref 0.0–0.1)
Basophils Relative: 0 %
Eosinophils Absolute: 0 K/uL (ref 0.0–0.5)
Eosinophils Relative: 0 %
HCT: 41.8 % (ref 39.0–52.0)
Hemoglobin: 13.7 g/dL (ref 13.0–17.0)
Immature Granulocytes: 0 %
Lymphocytes Relative: 66 %
Lymphs Abs: 16.6 K/uL — ABNORMAL HIGH (ref 0.7–4.0)
MCH: 29.8 pg (ref 26.0–34.0)
MCHC: 32.8 g/dL (ref 30.0–36.0)
MCV: 91.1 fL (ref 80.0–100.0)
Monocytes Absolute: 1.5 K/uL — ABNORMAL HIGH (ref 0.1–1.0)
Monocytes Relative: 6 %
Neutro Abs: 7 K/uL (ref 1.7–7.7)
Neutrophils Relative %: 28 %
Platelets: 181 K/uL (ref 150–400)
RBC: 4.59 MIL/uL (ref 4.22–5.81)
RDW: 14.3 % (ref 11.5–15.5)
WBC: 25.2 K/uL — ABNORMAL HIGH (ref 4.0–10.5)
nRBC: 0 % (ref 0.0–0.2)

## 2024-10-01 LAB — URINALYSIS, ROUTINE W REFLEX MICROSCOPIC
Bilirubin Urine: NEGATIVE
Glucose, UA: >=500 mg/dL — AB
Ketones, ur: NEGATIVE mg/dL
Nitrite: NEGATIVE
Protein, ur: NEGATIVE mg/dL
RBC / HPF: >50 RBC/hpf (ref 0–5)
Specific Gravity, Urine: 1.018 (ref 1.005–1.030)
WBC, UA: >50 WBC/hpf (ref 0–5)
pH: 5 (ref 5.0–8.0)

## 2024-10-01 LAB — CK: Total CK: 293 U/L (ref 49–397)

## 2024-10-01 MED ORDER — BISACODYL 5 MG PO TBEC: 5.0000 mg | DELAYED_RELEASE_TABLET | Freq: Every day | ORAL | Status: DC | PRN

## 2024-10-01 MED ORDER — LACTATED RINGERS IV BOLUS
1000.0000 mL | Freq: Once | INTRAVENOUS | Status: AC
  Administered 2024-10-01: 1000 mL via INTRAVENOUS

## 2024-10-01 MED ORDER — INSULIN ASPART 100 UNIT/ML IJ SOLN
0.0000 [IU] | Freq: Every day | INTRAMUSCULAR | Status: DC
  Administered 2024-10-01: 3 [IU] via SUBCUTANEOUS
  Administered 2024-10-03: 2 [IU] via SUBCUTANEOUS
  Filled 2024-10-01: qty 3
  Filled 2024-10-01: qty 2

## 2024-10-01 MED ORDER — ACETAMINOPHEN 325 MG PO TABS: 650.0000 mg | ORAL_TABLET | Freq: Four times a day (QID) | ORAL | Status: DC | PRN

## 2024-10-01 MED ORDER — ONDANSETRON HCL 4 MG PO TABS: 4.0000 mg | ORAL_TABLET | Freq: Four times a day (QID) | ORAL | Status: DC | PRN

## 2024-10-01 MED ORDER — ACETAMINOPHEN 650 MG RE SUPP: 650.0000 mg | Freq: Four times a day (QID) | RECTAL | Status: DC | PRN

## 2024-10-01 MED ORDER — SENNOSIDES-DOCUSATE SODIUM 8.6-50 MG PO TABS: 1.0000 | ORAL_TABLET | Freq: Every evening | ORAL | Status: DC | PRN

## 2024-10-01 MED ORDER — ENOXAPARIN SODIUM 40 MG/0.4ML IJ SOSY
40.0000 mg | PREFILLED_SYRINGE | INTRAMUSCULAR | Status: DC
  Administered 2024-10-02 – 2024-10-04 (×3): 40 mg via SUBCUTANEOUS
  Filled 2024-10-01: qty 0.4

## 2024-10-01 MED ORDER — SODIUM CHLORIDE 0.9 % IV SOLN
1.0000 g | Freq: Once | INTRAVENOUS | Status: AC
  Administered 2024-10-01: 1 g via INTRAVENOUS
  Filled 2024-10-01: qty 10

## 2024-10-01 MED ORDER — ONDANSETRON HCL 4 MG/2ML IJ SOLN: 4.0000 mg | Freq: Four times a day (QID) | INTRAMUSCULAR | Status: DC | PRN

## 2024-10-01 MED ORDER — INSULIN ASPART 100 UNIT/ML IJ SOLN
0.0000 [IU] | Freq: Three times a day (TID) | INTRAMUSCULAR | Status: DC
  Administered 2024-10-02: 5 [IU] via SUBCUTANEOUS
  Administered 2024-10-02 (×2): 3 [IU] via SUBCUTANEOUS
  Administered 2024-10-03: 8 [IU] via SUBCUTANEOUS
  Administered 2024-10-03: 3 [IU] via SUBCUTANEOUS
  Administered 2024-10-03: 8 [IU] via SUBCUTANEOUS
  Administered 2024-10-04: 15 [IU] via SUBCUTANEOUS
  Administered 2024-10-04: 8 [IU] via SUBCUTANEOUS
  Administered 2024-10-05: 5 [IU] via SUBCUTANEOUS
  Administered 2024-10-05: 11 [IU] via SUBCUTANEOUS
  Administered 2024-10-05: 8 [IU] via SUBCUTANEOUS
  Administered 2024-10-06: 11 [IU] via SUBCUTANEOUS
  Administered 2024-10-06: 5 [IU] via SUBCUTANEOUS
  Filled 2024-10-01: qty 5
  Filled 2024-10-01 (×2): qty 3

## 2024-10-01 MED ORDER — LACTATED RINGERS IV SOLN: INTRAVENOUS | Status: AC

## 2024-10-01 NOTE — ED Provider Notes (Signed)
 " [place] EMERGENCY DEPARTMENT AT [facility] Provider Note   CSN: [id number] Arrival date & time: 10/01/24  1555     History  Chief Complaint  Patient presents with   Hyperglycemia   Fall    Ryan Gilmore is a 89 y.o. male with PMH as listed below who presents with BIB EMS from home. Wife says she saw him last at 0700 and he fell onto the floor, did not call anyone (she has dementia) daughter came over at 34 and he was still on the floor but patient arrived here at 1600. [name] daughter took blood sugar and noted 439 mg/dL, known by EMS for non med compliance. Pt w/ h/o dementia as well. Per daughter, went to [place] for wellness check up last Wednesday. Urine at that time was cloudy but she hasn't heard about the results yet. Does have h/o lymphoma under surveillance as well as prostate cancer under surveillance. Daughter states he is acting normally to her.   T on arrival 99.13F.   Past Medical History:  Diagnosis Date   Bleeding duodenal ulcer    ?  1980s   Dementia (HCC)    Diabetes mellitus without complication (HCC)    Hyperlipidemia        Home Medications Prior to Admission medications  Medication Sig Start Date End Date Taking? Authorizing Provider  aspirin  EC 81 MG tablet Take 81 mg by mouth daily. Swallow whole.    [provider]  Blood Glucose Monitoring Suppl (BLOOD GLUCOSE SYSTEM PAK) KIT Please dispense based on patient and insurance preference. Use as directed to monitor FSBS 2x. Dx: E11.E11.65. 03/24/19   Duanne Butler DASEN, MD  cephALEXin  (KEFLEX ) 500 MG capsule Take 1 capsule (500 mg total) by mouth 3 (three) times daily. Patient not taking: Reported on 08/24/2024 07/21/23   Duanne Butler DASEN, MD  empagliflozin (JARDIANCE) 25 MG TABS tablet Take 25 mg by mouth daily.    [provider]  galantamine (RAZADYNE) 12 MG tablet Take 12 mg by mouth 2 (two) times daily.    [provider]  glipiZIDE  (GLUCOTROL ) 5 MG tablet  Take by mouth 2 (two) times daily before a meal.    [provider]  Glucose Blood (BLOOD GLUCOSE TEST STRIPS) STRP Please dispense one touch verio flex lancets, and testing strips. Use as directed to monitor FSBS 2x. Dx: E11.E11.65. 03/30/19   Duanne Butler DASEN, MD  Lancets MISC Please dispense based on patient and insurance preference. Use as directed to monitor FSBS 2x. Dx: E11.E11.65. 03/24/19   Duanne Butler DASEN, MD  losartan  (COZAAR ) 25 MG tablet Take 25 mg by mouth daily.    [provider]  Memantine HCl (NAMENDA PO) Take by mouth.    [provider]  metFORMIN  (GLUCOPHAGE ) 500 MG tablet TAKE 2 TABLETS BY MOUTH TWICE DAILY WITH A MEAL 05/09/19   Duanne Butler DASEN, MD  Multiple Vitamin (MULTIVITAMIN) tablet Take 1 tablet by mouth daily.    [provider]  pioglitazone  (ACTOS ) 30 MG tablet Take 1 tablet (30 mg total) by mouth daily. 06/30/23   Duanne Butler DASEN, MD  rosuvastatin  (CRESTOR ) 40 MG tablet Take 40 mg by mouth daily.    [provider]      Allergies    Patient has no known allergies.    Review of Systems   Review of Systems A 10 point review of systems was performed and is negative unless otherwise reported in HPI.  Physical Exam Updated  Vital Signs BP (!) 142/71 (BP Location: Left Arm)   Pulse 79   Temp 97.7 F (36.5 C) (Oral)   Resp 14   SpO2 95%  Physical Exam General: Normal appearing elderly male, lying in bed.  HEENT: NCAT, EOMI, PERRLA, Sclera anicteric, MMM, trachea midline.  Cardiology: RRR, no murmurs/rubs/gallops.  No chest wall tender palpation deformities or crepitus Resp: Normal respiratory rate and effort. CTAB, no wheezes, rhonchi, crackles.  Abd: Soft, non-tender, non-distended. No rebound tenderness or guarding.  Pelvis: Pelvis stable nontender MSK: No peripheral edema or signs of trauma. Extremities without deformity or TTP.  Skin: warm, dry.  Back: No CVA tenderness no midline CT or L-spine tenderness  palpation deformities or step-offs. Neuro: A&Ox1 (baseline), CNs II-XII grossly intact. MAEs. Sensation grossly intact. Normal speech. Psych: Normal mood and affect.   ED Results / Procedures / Treatments   Labs (all labs ordered are listed, but only abnormal results are displayed) Labs Reviewed  CBC WITH DIFFERENTIAL/PLATELET - Abnormal; Notable for the following components:      Result Value   WBC 25.2 (*)    Lymphs Abs 16.6 (*)    Monocytes Absolute 1.5 (*)    Abs Immature Granulocytes 0.10 (*)    All other components within normal limits  COMPREHENSIVE METABOLIC PANEL WITH GFR - Abnormal; Notable for the following components:   Glucose, Bld 361 (*)    GFR, Estimated 57 (*)    All other components within normal limits  URINALYSIS, ROUTINE W REFLEX MICROSCOPIC - Abnormal; Notable for the following components:   Glucose, UA >=500 (*)    Hgb urine dipstick LARGE (*)    Leukocytes,Ua SMALL (*)    Bacteria, UA FEW (*)    All other components within normal limits  CBG MONITORING, ED - Abnormal; Notable for the following components:   Glucose-Capillary 334 (*)    All other components within normal limits  CBG MONITORING, ED - Abnormal; Notable for the following components:   Glucose-Capillary 253 (*)    All other components within normal limits  URINE CULTURE  CK    EKG EKG Interpretation Date/Time:  Saturday October 01 2024 17:00:58 EST Ventricular Rate:  81 PR Interval:  164 QRS Duration:  90 QT Interval:  352 QTC Calculation: 409 R Axis:   80  Text Interpretation: Sinus rhythm Confirmed by Franklyn Gills [phone]) on 10/01/2024 5:34:21 PM  Radiology DG Chest Portable 1 View Result Date: 10/01/2024 EXAM: 1 VIEW(S) XRAY OF THE CHEST 10/01/2024 04:41:00 PM COMPARISON: 03/09/2024 CLINICAL HISTORY: fall, ?AMS FINDINGS: LUNGS AND PLEURA: Low lung volumes. Likely calcified granuloma overlying right apex. No pleural effusion. No pneumothorax. HEART AND MEDIASTINUM: No acute  abnormality of the cardiac and mediastinal silhouettes. BONES AND SOFT TISSUES: No acute osseous abnormality. IMPRESSION: 1. No acute findings. Electronically signed by: Greig Pique MD 10/01/2024 06:01 PM EST RP Workstation: [id number]    Procedures Procedures    Medications Ordered in ED Medications  lactated ringers  bolus 1,000 mL (0 mLs Intravenous Stopped 10/01/24 1910)  cefTRIAXone  (ROCEPHIN ) 1 g in sodium chloride  0.9 % 100 mL IVPB (1 g Intravenous New Bag/Given 10/01/24 2202)    ED Course/ Medical Decision Making/ A&P                          Medical Decision Making Amount and/or Complexity of Data Reviewed Labs: ordered. Decision-making details documented in ED Course. Radiology: ordered. Decision-making details documented in ED Course.  Risk Decision  regarding hospitalization.    This patient presents to the ED for concern of fall at home, gen weakness, this involves an extensive number of treatment options, and is a complaint that carries with it a high risk of complications and morbidity.  I considered the following differential and admission for this acute, potentially life threatening condition. Non-toxic appearing, HDS.  MDM:    Patient with generalized weakness and fall at home.  No signs of head trauma or any evidence of head strike.  Patient at his baseline mental status and does not complain of any headache.  No neck pain or tenderness palpation in the C-spine.  No evidence of any trauma on his body.  Wife lives with patient but she also has dementia.  Patient laid on the ground all morning due to generalized weakness.  Patient does have hyperglycemia but no evidence of DKA or HHS.  He has leukocytosis but does have lymphoma under surveillance and typically does have leukocytosis.  Chest x-ray does not demonstrate any pneumonia, pulmonary edema, PTX, pleural effusion.  Several attempts were made at collecting patient's urine via voiding and subsequently In-N-Out  catheterization.  Eventually UA was obtained which did show urinary tract infection.  Patient was given IV fluids and ceftriaxone  and will be admitted to medicine. No fever, tachycardia, or tachypnea, lower concern for sepsis. Overall non-toxic appearing.  Clinical Course as of 10/01/24 2233  Sat Oct 01, 2024  1731 Glucose(!): 361 +hyperglycemia [HN]  1731 CO2: 27 [HN]  1731 Anion gap: 10 No signs of DKA [HN]  1734 WBC(!): 25.2 +Leukocytosis w/ left shift [HN]  1917 DG Chest Portable 1 View 1. No acute findings. [HN]  2146 Urinalysis, Routine w reflex microscopic -Urine, Clean Catch(!) +UTI, will give IV ceftriaxone  [HN]    Clinical Course User Index [HN] Franklyn Sid SAILOR, MD    Labs: I Ordered, and personally interpreted labs.  The pertinent results include:  those listed above  Imaging Studies ordered: I ordered imaging studies including CXR I independently visualized and interpreted imaging. I agree with the radiologist interpretation  Additional history obtained from chart review, daughter and wife at bedside  Reevaluation: After the interventions noted above, I reevaluated the patient and found that they have :stayed the same  Social Determinants of Health: Lives independently with wife  Disposition:  Admit to hospitalist Dr. Lou  Co morbidities that complicate the patient evaluation  Past Medical History:  Diagnosis Date   Bleeding duodenal ulcer    ?  1980s   Dementia (HCC)    Diabetes mellitus without complication (HCC)    Hyperlipidemia      Medicines Meds ordered this encounter  Medications   lactated ringers  bolus 1,000 mL   cefTRIAXone  (ROCEPHIN ) 1 g in sodium chloride  0.9 % 100 mL IVPB    Antibiotic Indication::   UTI    I have reviewed the patients home medicines and have made adjustments as needed  Problem List / ED Course: Problem List Items Addressed This Visit       Genitourinary   * (Principal) UTI (urinary tract infection)    Other Visit Diagnoses       Fall at home, initial encounter    -  Primary     Generalized weakness                       This note was created using dictation software, which may contain spelling or grammatical errors.    Franklyn Sid  N, MD 10/01/24 2236  "

## 2024-10-01 NOTE — ED Triage Notes (Signed)
 Patient BIB EMS from home.  Wife says she saw him last at 0700 did not call anyone (she has dementia) daughter came over at 1200 and he was still on the floor but patient arrived here at 0400.  Patient noted to have 439 BGS, known by EMS for non med compliance.  No visible injuries from fall, not even sure IF he fell or was just sitting on the floor.

## 2024-10-01 NOTE — H&P (Incomplete)
 " History and Physical  Ryan Gilmore FMW:[id number] DOB: 06/06/1933 DOA: 10/01/2024  PCP: Duanne Butler DASEN, MD   Chief Complaint: Fall  HPI: Ryan Gilmore is a 89 y.o. 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  ED Course: Initial vitals show temp 99.8, HR 70-80, SBP 110-140s, SpO2 94% on room air. Initial labs significant for WBC 25, blood glucose 361, normal renal function, CK 293, UA shows significant glucosuria, large hemoglobinuria, negative nitrite, small leuks, RBC >50, WBC >50 and few bacteria. EKG shows sinus rhythm. CXR shows no active disease. Pt received IV LR 1 L bolus and IV Rocephin .  TRH was consulted for admission.   Review of Systems: Please see HPI for pertinent positives and negatives. A complete 10 system review of systems are otherwise negative.  Past Medical History:  Diagnosis Date   Bleeding duodenal ulcer    ?  1980s   Dementia (HCC)    Diabetes mellitus without complication (HCC)    Hyperlipidemia    Past Surgical History:  Procedure Laterality Date   APPENDECTOMY     Social History:  reports that he has never smoked. He has quit using smokeless tobacco.  His smokeless tobacco use included chew. He reports that he does not currently use alcohol. He reports that he does not use drugs.  Allergies[1]  Family History  Problem Relation Age of Onset   Heart disease Father    Breast cancer Sister    Breast cancer Sister    Diabetes Brother      Prior to Admission medications  Medication Sig Start Date End Date Taking? Authorizing Provider  aspirin  EC 81 MG tablet Take 81 mg by mouth daily. Swallow whole.   Yes [provider]  Blood Glucose Monitoring Suppl (BLOOD GLUCOSE SYSTEM PAK) KIT Please dispense based on patient and insurance preference. Use as directed to monitor FSBS 2x. Dx: E11.E11.65.  03/24/19  Yes Duanne Butler DASEN, MD  empagliflozin (JARDIANCE) 25 MG TABS tablet Take 25 mg by mouth daily.   Yes [provider]  glipiZIDE  (GLUCOTROL ) 5 MG tablet Take by mouth 2 (two) times daily before a meal.   Yes [provider]  Glucose Blood (BLOOD GLUCOSE TEST STRIPS) STRP Please dispense one touch verio flex lancets, and testing strips. Use as directed to monitor FSBS 2x. Dx: E11.E11.65. 03/30/19  Yes Duanne Butler DASEN, MD  Lancets MISC Please dispense based on patient and insurance preference. Use as directed to monitor FSBS 2x. Dx: E11.E11.65. 03/24/19  Yes Duanne Butler DASEN, MD  losartan  (COZAAR ) 25 MG tablet Take 25 mg by mouth daily.   Yes [provider]  metFORMIN  (GLUCOPHAGE ) 500 MG tablet TAKE 2 TABLETS BY MOUTH TWICE DAILY WITH A MEAL 05/09/19  Yes Duanne Butler DASEN, MD  Multiple Vitamin (MULTIVITAMIN) tablet Take 1 tablet by mouth daily.   Yes [provider]  pioglitazone  (ACTOS ) 30 MG tablet Take 1 tablet (30 mg total) by mouth daily. 06/30/23  Yes Duanne Butler DASEN, MD  rosuvastatin  (CRESTOR ) 40 MG tablet Take 40 mg by mouth daily.   Yes [provider]  cephALEXin  (KEFLEX ) 500 MG capsule Take 1 capsule (500 mg total) by mouth 3 (three) times daily. Patient not taking: Reported on 08/24/2024 07/21/23   Duanne Butler DASEN, MD  galantamine (RAZADYNE) 12 MG tablet Take 12 mg by mouth 2 (two)  times daily. Patient not taking: Reported on 10/01/2024    [provider]  Memantine HCl (NAMENDA PO) Take by mouth. Patient not taking: Reported on 10/01/2024    [provider]    Physical Exam: BP (!) 142/71 (BP Location: Left Arm)   Pulse 79   Temp 97.7 F (36.5 C) (Oral)   Resp 14   SpO2 95%  General: Pleasant, well-appearing *** laying in bed. No acute distress. HEENT: [place]/AT. Anicteric sclera CV: RRR. No murmurs, rubs, or gallops. No LE edema Pulmonary: Lungs CTAB. Normal effort. No wheezing or rales. Abdominal:  Soft, nontender, nondistended. Normal bowel sounds. Extremities: Palpable radial and DP pulses. Normal ROM. Skin: Warm and dry. No obvious rash or lesions. Neuro: A&Ox3. Moves all extremities. Normal sensation to light touch. No focal deficit. Psych: Normal mood and affect          Labs on Admission:  Basic Metabolic Panel: Recent Labs  Lab 10/01/24 1620  NA 137  K 4.6  CL 99  CO2 27  GLUCOSE 361*  BUN 16  CREATININE 1.21  CALCIUM  9.6   Liver Function Tests: Recent Labs  Lab 10/01/24 1620  AST 21  ALT <5  ALKPHOS 77  BILITOT 0.4  PROT 7.0  ALBUMIN 4.2   No results for input(s): LIPASE, AMYLASE in the last 168 hours. No results for input(s): AMMONIA in the last 168 hours. CBC: Recent Labs  Lab 10/01/24 1620  WBC 25.2*  NEUTROABS 7.0  HGB 13.7  HCT 41.8  MCV 91.1  PLT 181   Cardiac Enzymes: Recent Labs  Lab 10/01/24 1816  CKTOTAL 293   BNP (last 3 results) No results for input(s): BNP in the last 8760 hours.  ProBNP (last 3 results) No results for input(s): PROBNP in the last 8760 hours.  CBG: Recent Labs  Lab 10/01/24 1658 10/01/24 2201  GLUCAP 334* 253*    Radiological Exams on Admission: DG Chest Portable 1 View Result Date: 10/01/2024 EXAM: 1 VIEW(S) XRAY OF THE CHEST 10/01/2024 04:41:00 PM COMPARISON: 03/09/2024 CLINICAL HISTORY: fall, ?AMS FINDINGS: LUNGS AND PLEURA: Low lung volumes. Likely calcified granuloma overlying right apex. No pleural effusion. No pneumothorax. HEART AND MEDIASTINUM: No acute abnormality of the cardiac and mediastinal silhouettes. BONES AND SOFT TISSUES: No acute osseous abnormality. IMPRESSION: 1. No acute findings. Electronically signed by: Greig Pique MD 10/01/2024 06:01 PM EST RP Workstation: [id number]   Assessment/Plan Ryan Gilmore is a 89 y.o. male with medical history significant for vascular dementia, T2DM, HLD, PE/DVT not on AOC, lymphoma on surveillance and HTN who presented to the ED for  evaluation of fall and hyperglycemia and admitted for urinary tract infection.  # Acute cystitis - Pt presented with *** - Urinalysis shows signs of infection - Continue IV rocephin  - F/u urine culture - Trend CBC and fever curve  # Fall # Generalized weakness - - -  # T2DM with hyperglycemia - Last A1c *** - Blood glucose of *** on admission - Home regimen includes *** - *** - SSI with meals, CBG monitoring - Carb modified diet - F/u repeat A1c   # HTN - BP stable with SBP in the 110-140s - Continue losartan   # HLD - Continue rosuvastatin   # Vascular dementia - At baseline oriented to self and person (can identify spouse but not daughter), not oriented to place, time and situation - Continue memantine - Delirium precautions  #***  DVT prophylaxis: Lovenox      Code Status: Full Code  Consults called: None  Family Communication: Discussed results/findings and plan for admission with spouse and daughter at bedside  Severity of Illness: The appropriate patient status for this patient is INPATIENT. Inpatient status is judged to be reasonable and necessary in order to provide the required intensity of service to ensure the patient's safety. The patient's presenting symptoms, physical exam findings, and initial radiographic and laboratory data in the context of their chronic comorbidities is felt to place them at high risk for further clinical deterioration. Furthermore, it is not anticipated that the patient will be medically stable for discharge from the hospital within 2 midnights of admission.   * I certify that at the point of admission it is my clinical judgment that the patient will require inpatient hospital care spanning beyond 2 midnights from the point of admission due to high intensity of service, high risk for further deterioration and high frequency of surveillance required.*  Level of care: Med-Surg    Lou Claretta HERO, MD 10/01/2024, 10:47  PM [facility] Pager: [phone] Isaiah 41:10   If 7PM-7AM, please contact night-coverage [email or web address] Password [email or web address]    [1] No Known Allergies  "

## 2024-10-01 NOTE — ED Notes (Signed)
 Unable to cath patient, tip of penis red, thick white discharge, catheter will not pass through.  Patient is able to urinate but incontinent of urine and a condom cath will not stay on the patient as tip is retracted.

## 2024-10-02 ENCOUNTER — Encounter (HOSPITAL_COMMUNITY): Payer: Self-pay | Admitting: Student

## 2024-10-02 ENCOUNTER — Other Ambulatory Visit: Payer: Self-pay

## 2024-10-02 DIAGNOSIS — W19XXXA Unspecified fall, initial encounter: Secondary | ICD-10-CM | POA: Diagnosis not present

## 2024-10-02 DIAGNOSIS — Y92009 Unspecified place in unspecified non-institutional (private) residence as the place of occurrence of the external cause: Secondary | ICD-10-CM | POA: Diagnosis not present

## 2024-10-02 DIAGNOSIS — U071 COVID-19: Secondary | ICD-10-CM | POA: Diagnosis present

## 2024-10-02 DIAGNOSIS — R531 Weakness: Secondary | ICD-10-CM

## 2024-10-02 DIAGNOSIS — N3000 Acute cystitis without hematuria: Secondary | ICD-10-CM | POA: Diagnosis not present

## 2024-10-02 DIAGNOSIS — F01C0 Vascular dementia, severe, without behavioral disturbance, psychotic disturbance, mood disturbance, and anxiety: Secondary | ICD-10-CM | POA: Diagnosis present

## 2024-10-02 LAB — CBC
HCT: 38.6 % — ABNORMAL LOW (ref 39.0–52.0)
Hemoglobin: 12.6 g/dL — ABNORMAL LOW (ref 13.0–17.0)
MCH: 29.5 pg (ref 26.0–34.0)
MCHC: 32.6 g/dL (ref 30.0–36.0)
MCV: 90.4 fL (ref 80.0–100.0)
Platelets: 164 K/uL (ref 150–400)
RBC: 4.27 MIL/uL (ref 4.22–5.81)
RDW: 14.3 % (ref 11.5–15.5)
WBC: 32 K/uL — ABNORMAL HIGH (ref 4.0–10.5)
nRBC: 0 % (ref 0.0–0.2)

## 2024-10-02 LAB — BASIC METABOLIC PANEL WITH GFR
Anion gap: 9 (ref 5–15)
BUN: 14 mg/dL (ref 8–23)
CO2: 27 mmol/L (ref 22–32)
Calcium: 8.9 mg/dL (ref 8.9–10.3)
Chloride: 103 mmol/L (ref 98–111)
Creatinine, Ser: 0.97 mg/dL (ref 0.61–1.24)
GFR, Estimated: >60 mL/min
Glucose, Bld: 133 mg/dL — ABNORMAL HIGH (ref 70–99)
Potassium: 3.9 mmol/L (ref 3.5–5.1)
Sodium: 138 mmol/L (ref 135–145)

## 2024-10-02 LAB — RESP PANEL BY RT-PCR (RSV, FLU A&B, COVID)  RVPGX2
Influenza A by PCR: NEGATIVE
Influenza B by PCR: NEGATIVE
Resp Syncytial Virus by PCR: NEGATIVE
SARS Coronavirus 2 by RT PCR: POSITIVE — AB

## 2024-10-02 LAB — GLUCOSE, CAPILLARY
Glucose-Capillary: 165 mg/dL — ABNORMAL HIGH (ref 70–99)
Glucose-Capillary: 193 mg/dL — ABNORMAL HIGH (ref 70–99)
Glucose-Capillary: 222 mg/dL — ABNORMAL HIGH (ref 70–99)
Glucose-Capillary: 171 mg/dL — ABNORMAL HIGH (ref 70–99)

## 2024-10-02 LAB — PHOSPHORUS: Phosphorus: 3 mg/dL (ref 2.5–4.6)

## 2024-10-02 LAB — MAGNESIUM: Magnesium: 1.5 mg/dL — ABNORMAL LOW (ref 1.7–2.4)

## 2024-10-02 LAB — HEMOGLOBIN A1C
Hgb A1c MFr Bld: 10.3 % — ABNORMAL HIGH (ref 4.8–5.6)
Mean Plasma Glucose: 248.91 mg/dL

## 2024-10-02 MED ORDER — GLIPIZIDE 10 MG PO TABS
5.0000 mg | ORAL_TABLET | Freq: Two times a day (BID) | ORAL | Status: DC
  Administered 2024-10-02 – 2024-10-04 (×5): 5 mg via ORAL
  Filled 2024-10-02 (×2): qty 1

## 2024-10-02 MED ORDER — LOSARTAN POTASSIUM 25 MG PO TABS
25.0000 mg | ORAL_TABLET | Freq: Every day | ORAL | Status: DC
  Administered 2024-10-02 – 2024-10-04 (×3): 25 mg via ORAL
  Filled 2024-10-02 (×2): qty 1

## 2024-10-02 MED ORDER — MAGNESIUM SULFATE 2 GM/50ML IV SOLN
2.0000 g | Freq: Once | INTRAVENOUS | Status: AC
  Administered 2024-10-02: 2 g via INTRAVENOUS
  Filled 2024-10-02: qty 50

## 2024-10-02 MED ORDER — ADULT MULTIVITAMIN W/MINERALS CH
1.0000 | ORAL_TABLET | Freq: Every day | ORAL | Status: DC
  Administered 2024-10-02 – 2024-10-06 (×5): 1 via ORAL
  Filled 2024-10-02: qty 1

## 2024-10-02 MED ORDER — ASPIRIN 81 MG PO TBEC
81.0000 mg | DELAYED_RELEASE_TABLET | Freq: Every day | ORAL | Status: DC
  Administered 2024-10-02 – 2024-10-06 (×5): 81 mg via ORAL
  Filled 2024-10-02: qty 1

## 2024-10-02 MED ORDER — GUAIFENESIN 100 MG/5ML PO LIQD: 5.0000 mL | ORAL | Status: DC | PRN

## 2024-10-02 MED ORDER — PIOGLITAZONE HCL 15 MG PO TABS
30.0000 mg | ORAL_TABLET | Freq: Every day | ORAL | Status: DC
  Administered 2024-10-02 – 2024-10-04 (×3): 30 mg via ORAL
  Filled 2024-10-02: qty 2

## 2024-10-02 MED ORDER — ROSUVASTATIN CALCIUM 10 MG PO TABS
40.0000 mg | ORAL_TABLET | Freq: Every day | ORAL | Status: DC
  Administered 2024-10-02 – 2024-10-04 (×3): 40 mg via ORAL
  Filled 2024-10-02: qty 4

## 2024-10-02 MED ORDER — SODIUM CHLORIDE 0.9 % IV SOLN
1.0000 g | INTRAVENOUS | Status: DC
  Administered 2024-10-02 – 2024-10-03 (×2): 1 g via INTRAVENOUS
  Filled 2024-10-02: qty 10

## 2024-10-02 MED ORDER — HYDROCERIN EX CREA
TOPICAL_CREAM | Freq: Two times a day (BID) | CUTANEOUS | Status: DC
  Filled 2024-10-02: qty 113

## 2024-10-02 MED ORDER — ZINC OXIDE 40 % EX OINT
TOPICAL_OINTMENT | Freq: Two times a day (BID) | CUTANEOUS | Status: DC
  Filled 2024-10-02: qty 57

## 2024-10-02 NOTE — Plan of Care (Signed)
" °  Problem: Fluid Volume: Goal: Ability to maintain a balanced intake and output will improve Outcome: Progressing   Problem: Metabolic: Goal: Ability to maintain appropriate glucose levels will improve Outcome: Progressing   Problem: Nutritional: Goal: Maintenance of adequate nutrition will improve Outcome: Progressing   Problem: Skin Integrity: Goal: Risk for impaired skin integrity will decrease Outcome: Progressing   Problem: Coping: Goal: Level of anxiety will decrease Outcome: Progressing   Problem: Elimination: Goal: Will not experience complications related to urinary retention Outcome: Progressing   "

## 2024-10-02 NOTE — Consult Note (Signed)
 WOC Nurse Consult Note: Reason for Consult: heel ulcer  Wound type: 1.  Thick hyperkeratotic skin to R heel; unable to assess skin under this layer  2.  Stage 2 Pressure Injury coccyx and medial buttocks pink dry  Pressure Injury POA: Yes Measurement: see nursing flowsheet  Wound bed: as above; no open wound noted to R heel  Drainage (amount, consistency, odor) appears dry  Periwound: erythema to buttocks/sacrum  Dressing procedure/placement/frequency:  Cleanse B feet with soap and water, dry and apply Eucerin cream 2 times daily.  Free float heels using pillows to offload pressure.   Cleanse buttocks/sacrum/coccyx with soap and water, apply silicone foam to sacrococcygeal area.  Lift foam daily to assess area, change foam q3 days and prn soiling.   Will order Desitin to buttocks 2 times daily and prn soiling.    POC discussed with bedside nurse. WOC team will not follow. Reconsult if further needs arise.   Thank you,    Powell Bar MSN, RN-BC, [employer]

## 2024-10-02 NOTE — Progress Notes (Addendum)
 " Progress Note   Patient: Ryan Gilmore FMW:[id number] DOB: [birth year] DOA: 10/01/2024     1 DOS: the patient was seen and examined on 10/02/2024   Brief hospital course:  Ryan Gilmore is a 89 y.o. 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  Per daughter, patient lives with his spouse and was putting on his pants in the morning when he fell. His spouse who also have dementia attempted to pick him up but was unsuccessful and patient laid on the floor for more than 4 hours before daughter arrived at the house.  Later in the afternoon, patient CBG was elevated to 470 and he felt warm to touch. Multiple family members have had the cough and cold over the last few days the patient started coughing so she presented him to the ED for further evaluation.  Patient currently denies any pain, chest pain or shortness of breath.  At baseline, he is only oriented to self and occasionally person (able to identify spouse and sometimes daughter). He is not oriented to place, time or situation.   ED Course: Initial vitals show temp 99.8, HR 70-80, SBP 110-140s, SpO2 94% on room air. Initial labs significant for WBC 25, blood glucose 361, normal renal function, CK 293, UA shows significant glucosuria, large hemoglobinuria, negative nitrite, small leuks, RBC >50, WBC >50 and few bacteria. EKG shows sinus rhythm. CXR shows no active disease. Pt received IV LR 1 L bolus and IV Rocephin . TRH was consulted for admission.   Assessment and Plan: # Acute cystitis - Pt presented with a fall in the setting of generalized weakness - Urinalysis shows signs of infection-urine culture pending-continue empiric Rocephin  - Tmax at presentation was 99.8 with a.m. temperature 99.2. -Patient has coinfection with COVID-19   # COVID-19 infection - Patient presented after a fall at home in the  setting of generalized weakness - Per daughter patient with subjective fever and cough at home with known sick contact of daughter and spouse over the last few days - Respiratory panel after admission positive for COVID-19 - He is in no respiratory distress, CXR without signs of pneumonia since p.o. intake has been variable - Symptomatic management with IVLR 125 cc/h and as needed Robitussin for cough - Droplet and contact precautions   # Fall # Generalized weakness - Had a mild fall while putting on pants in the setting of weakness, no trauma or pain - Likely secondary urinary tract infection and COVID infection - PT/OT eval and treat - Fall precautions   # T2DM with hyperglycemia - Last A1c 10.0% 6 months ago, blood sugar elevated to the 200s to 300s on admission - Home regimen includes Jardiance, metformin , glipizide  and pioglitazone  - Continue glipizide  and pioglitazone , hold Jardiance and metformin -CBGs have ranged between 165 and 253 since admission - Advised daughter to discuss with patient's endocrinologist about possibly discontinuing Jardiance altogether in the setting of his recurrent UTIs - SSI with meals, CBG monitoring - F/u repeat A1c and consider increasing glipizide  dose if still above goal  # Acute hypomagnesemia -Magnesium  IV 2 g x 1   # HTN - BP stable with SBP in the 110-140s - Continue losartan    # HLD - Continue rosuvastatin    # Vascular dementia - At baseline oriented to self and person (can identify spouse but not daughter), not oriented to place, time and  situation - Delirium precautions -Patient has a pattern of awakening, eating and taking naps regularly at home   # Lymphoma - Currently under active surveillance, WBC of 25 seems to be around his recent baseline - Follow-up with oncology in the outpatient  Right heel wound History of sacral decubitus Wound 10/02/24 0030 Other (Comment) Heel Right (Active)  Date First Assessed/Time First Assessed:  10/02/24 0030   Present on Original Admission: Yes  Primary Wound Type: (c) Other (Comment)  Location: Heel  Location Orientation: Right    Assessments 10/02/2024  1:16 AM 10/02/2024  9:22 AM  Wound Image     Site / Wound Assessment Yellow;Painful Painful;Yellow  Peri-wound Assessment Intact Intact  Drainage Description Other (Comment) --  Drainage Amount Small None  Dressing Type None None     No associated orders.     Sacral Wound      Subjective: Patient dozing heavily.  History obtained from family at bedside including wife.  Physical Exam: Vitals:   10/01/24 2047 10/02/24 0007 10/02/24 0028 10/02/24 0500  BP:   98/62 118/62  Pulse:   80 74  Resp: 14  16 16   Temp: 97.7 F (36.5 C)  98.5 F (36.9 C) 98.6 F (37 C)  TempSrc: Oral  Oral Oral  SpO2:   95% 97%  Weight:  77.1 kg    Height:  5' 11 (1.803 m)     Constitutional: NAD, calm, comfortable Respiratory: clear to auscultation bilaterally, no wheezing, no crackles. Normal respiratory effort. No accessory muscle use. RA Cardiovascular: Regular rate and rhythm, no murmurs / rubs / gallops. No extremity edema. 2+ pedal pulses. Abdomen: no tenderness, no masses palpated. No hepatosplenomegaly. Bowel sounds positive.  Musculoskeletal: no clubbing / cyanosis. No joint deformity upper and lower extremities. Good ROM, no contractures. Normal muscle tone.  Skin: no rashes, lesions, ulcers. No induration Neurologic: Unable to adequately assess due to patient currently sleeping Psychiatric: Dozing after breakfast which is his normal per family.     Data Reviewed:  Sodium 138, potassium 3.9, chloride 103, CO2 27, glucose 133, BUN 14, creatinine 0.97, magnesium  1.5  WBC 32,000 differential not obtained, hemoglobin 12.6, platelets 164,000  COVID PCR positive  Family Communication: Wife and other family member at bedside  Disposition: Status is: Inpatient and remains inpatient appropriate because: Acute metabolic  encephalopathy in patient with acute UTI and coinfection with COVID-19 with comorbidity of dementia  Planned Discharge Destination: Home with Home Health     Time spent: 37 minutes  Author: Isaiah Lever, NP 10/02/2024 8:10 AM  For on call review [email or web address].  "

## 2024-10-02 NOTE — Evaluation (Signed)
 Physical Therapy Evaluation Patient Details Name: Ryan Gilmore MRN: [id number] DOB: 02-07-1933 Today's Date: 10/02/2024  History of Present Illness  89 yo male admitted with UTI, fall, COVID. Hx of dementia, DM, PE, DVT  Clinical Impression  On eval, pt required Min-Mod A +2 for mobility. He ambulated ~20 feet with a RW in his room. Pt presents with general weakness, decreased activity tolerance, and impaired gait/balance. At baseline, pt is able to mobilize with his rollator at supervision-mod ind level. Family present during session-discussed d/c plan-hopeful and planning for pt to return home. Family is agreeable to HHPT f/u. Recommend OOB daily with nursing and/or mobility team        If plan is discharge home, recommend the following: A lot of help with walking and/or transfers;A lot of help with bathing/dressing/bathroom;Assistance with cooking/housework;Assist for transportation;Help with stairs or ramp for entrance;Direct supervision/assist for medications management;Direct supervision/assist for financial management   Can travel by private vehicle        Equipment Recommendations None recommended by PT  Recommendations for Other Services       Functional Status Assessment Patient has had a recent decline in their functional status and demonstrates the ability to make significant improvements in function in a reasonable and predictable amount of time.     Precautions / Restrictions Precautions Precautions: Fall Restrictions Weight Bearing Restrictions Per Provider Order: No      Mobility  Bed Mobility Overal bed mobility: Needs Assistance Bed Mobility: Supine to Sit     Supine to sit: Mod assist, +2 for physical assistance, +2 for safety/equipment, HOB elevated     General bed mobility comments: Assist for trunk and bil LEs. Increased time and effort for pt. Repeated multimodal cueing required.    Transfers Overall transfer level: Needs assistance Equipment  used: Rolling walker (2 wheels) Transfers: Sit to/from Stand Sit to Stand: Min assist, +2 physical assistance, +2 safety/equipment, From elevated surface           General transfer comment: Assist to power up, steady, control descent. Multimodal cueing required. Increased time. Unsteaday-remains at risk for falls.    Ambulation/Gait Ambulation/Gait assistance: Min assist, +2 safety/equipment Gait Distance (Feet): 20 Feet Assistive device: Rolling walker (2 wheels) Gait Pattern/deviations: Step-through pattern, Decreased stride length       General Gait Details: Assist to steady pt and manage RW. Cues for safety, direction. Pt tolerated distance well. O2 94% on RA. Unsteady-remains at risk for falls.  Stairs            Wheelchair Mobility     Tilt Bed    Modified Rankin (Stroke Patients Only)       Balance Overall balance assessment: Needs assistance, History of Falls         Standing balance support: Bilateral upper extremity supported, During functional activity, Reliant on assistive device for balance Standing balance-Leahy Scale: Poor                               Pertinent Vitals/Pain Pain Assessment Pain Assessment: Faces Faces Pain Scale: No hurt    Home Living Family/patient expects to be discharged to:: Private residence Living Arrangements: Spouse/significant other Available Help at Discharge: Family;Available 24 hours/day Type of Home: House Home Access: Stairs to enter   [employer] of Steps: 3   Home Layout: One level Home Equipment: Rollator (4 wheels);Rolling Walker (2 wheels)      Prior Function Prior Level of  Function : Needs assist             Mobility Comments: mod ind with rollator ADLs Comments: assist for bathing, dressing     Extremity/Trunk Assessment   Upper Extremity Assessment Upper Extremity Assessment: Defer to OT evaluation    Lower Extremity Assessment Lower Extremity Assessment:  Generalized weakness    Cervical / Trunk Assessment Cervical / Trunk Assessment: Kyphotic  Communication   Communication Communication: No apparent difficulties    Cognition Arousal: Alert Behavior During Therapy: Flat affect   PT - Cognitive impairments: History of cognitive impairments                         Following commands: Impaired Following commands impaired: Follows one step commands with increased time     Cueing Cueing Techniques: Verbal cues, Gestural cues, Tactile cues, Visual cues     General Comments      Exercises     Assessment/Plan    PT Assessment Patient needs continued PT services  PT Problem List Decreased strength;Decreased range of motion;Decreased activity tolerance;Decreased balance;Decreased mobility;Decreased knowledge of use of DME;Decreased cognition;Decreased safety awareness       PT Treatment Interventions DME instruction;Gait training;Functional mobility training;Therapeutic activities;Therapeutic exercise;Patient/family education;Balance training    PT Goals (Current goals can be found in the Care Plan section)  Acute Rehab PT Goals Patient Stated Goal: family planning for return home PT Goal Formulation: With family Time For Goal Achievement: 10/16/24 Potential to Achieve Goals: Good    Frequency Min 3X/week     Co-evaluation               AM-PAC PT 6 Clicks Mobility  Outcome Measure Help needed turning from your back to your side while in a flat bed without using bedrails?: A Lot Help needed moving from lying on your back to sitting on the side of a flat bed without using bedrails?: A Lot Help needed moving to and from a bed to a chair (including a wheelchair)?: A Lot Help needed standing up from a chair using your arms (e.g., wheelchair or bedside chair)?: A Lot Help needed to walk in hospital room?: A Lot Help needed climbing 3-5 steps with a railing? : A Lot 6 Click Score: 12    End of Session  Equipment Utilized During Treatment: Gait belt Activity Tolerance: Patient tolerated treatment well Patient left: in chair;with call bell/phone within reach;with family/visitor present (wife and daughter in room)   PT Visit Diagnosis: Muscle weakness (generalized) (M62.81);Difficulty in walking, not elsewhere classified (R26.2);History of falling (Z91.81);Unsteadiness on feet (R26.81)    Time: 8492-8472 PT Time Calculation (min) (ACUTE ONLY): 20 min   Charges:   PT Evaluation $PT Eval Low Complexity: 1 Low   PT General Charges $$ ACUTE PT VISIT: 1 Visit           Dannial SQUIBB, PT Acute Rehabilitation  Office: [phone]

## 2024-10-03 DIAGNOSIS — W19XXXA Unspecified fall, initial encounter: Secondary | ICD-10-CM

## 2024-10-03 DIAGNOSIS — U071 COVID-19: Secondary | ICD-10-CM | POA: Diagnosis not present

## 2024-10-03 DIAGNOSIS — Y92009 Unspecified place in unspecified non-institutional (private) residence as the place of occurrence of the external cause: Secondary | ICD-10-CM

## 2024-10-03 DIAGNOSIS — R531 Weakness: Secondary | ICD-10-CM

## 2024-10-03 LAB — GLUCOSE, CAPILLARY
Glucose-Capillary: 282 mg/dL — ABNORMAL HIGH (ref 70–99)
Glucose-Capillary: 294 mg/dL — ABNORMAL HIGH (ref 70–99)
Glucose-Capillary: 192 mg/dL — ABNORMAL HIGH (ref 70–99)
Glucose-Capillary: 240 mg/dL — ABNORMAL HIGH (ref 70–99)

## 2024-10-03 LAB — COMPREHENSIVE METABOLIC PANEL WITH GFR
ALT: 10 U/L (ref 0–44)
AST: 24 U/L (ref 15–41)
Albumin: 3.8 g/dL (ref 3.5–5.0)
Alkaline Phosphatase: 66 U/L (ref 38–126)
Anion gap: 14 (ref 5–15)
BUN: 12 mg/dL (ref 8–23)
CO2: 23 mmol/L (ref 22–32)
Calcium: 9.1 mg/dL (ref 8.9–10.3)
Chloride: 102 mmol/L (ref 98–111)
Creatinine, Ser: 1.05 mg/dL (ref 0.61–1.24)
GFR, Estimated: >60 mL/min
Glucose, Bld: 187 mg/dL — ABNORMAL HIGH (ref 70–99)
Potassium: 3.6 mmol/L (ref 3.5–5.1)
Sodium: 138 mmol/L (ref 135–145)
Total Bilirubin: 0.4 mg/dL (ref 0.0–1.2)
Total Protein: 6.3 g/dL — ABNORMAL LOW (ref 6.5–8.1)

## 2024-10-03 LAB — MAGNESIUM: Magnesium: 1.8 mg/dL (ref 1.7–2.4)

## 2024-10-03 LAB — CBC
HCT: 40.6 % (ref 39.0–52.0)
Hemoglobin: 13.5 g/dL (ref 13.0–17.0)
MCH: 29.7 pg (ref 26.0–34.0)
MCHC: 33.3 g/dL (ref 30.0–36.0)
MCV: 89.4 fL (ref 80.0–100.0)
Platelets: 164 K/uL (ref 150–400)
RBC: 4.54 MIL/uL (ref 4.22–5.81)
RDW: 14 % (ref 11.5–15.5)
WBC: 20.4 K/uL — ABNORMAL HIGH (ref 4.0–10.5)
nRBC: 0 % (ref 0.0–0.2)

## 2024-10-03 MED ORDER — PHENAZOPYRIDINE HCL 100 MG PO TABS
100.0000 mg | ORAL_TABLET | Freq: Once | ORAL | Status: AC
  Administered 2024-10-03: 100 mg via ORAL
  Filled 2024-10-03: qty 1

## 2024-10-03 NOTE — Progress Notes (Addendum)
 " Progress Note   Patient: Ryan Gilmore FMW:[id number] DOB: [birth year] DOA: 10/01/2024     2 DOS: the patient was seen and examined on 10/03/2024   Brief hospital course:  Ryan Gilmore is a 89 y.o. 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  Per daughter, patient lives with his spouse and was putting on his pants in the morning when he fell. His spouse who also have dementia attempted to pick him up but was unsuccessful and patient laid on the floor for more than 4 hours before daughter arrived at the house.  Later in the afternoon, patient CBG was elevated to 470 and he felt warm to touch. Multiple family members have had the cough and cold over the last few days the patient started coughing so she presented him to the ED for further evaluation.  Patient currently denies any pain, chest pain or shortness of breath.  At baseline, he is only oriented to self and occasionally person (able to identify spouse and sometimes daughter). He is not oriented to place, time or situation.   ED Course: Initial vitals show temp 99.8, HR 70-80, SBP 110-140s, SpO2 94% on room air. Initial labs significant for WBC 25, blood glucose 361, normal renal function, CK 293, UA shows significant glucosuria, large hemoglobinuria, negative nitrite, small leuks, RBC >50, WBC >50 and few bacteria. EKG shows sinus rhythm. CXR shows no active disease. Pt received IV LR 1 L bolus and IV Rocephin . TRH was consulted for admission.   Assessment and Plan: # Acute cystitis - Pt presented with a fall in the setting of generalized weakness - Urinalysis shows signs of infection- - Urine cultures pending, continue Rocephin .   # COVID-19 infection Patient remains on room air, he is not in any respiratory distress. His chest x-ray shows no evidence of pneumonia.  No indication for steroids or  antivirals at this time. Continue droplet and contact precautions.  # Fall # Generalized weakness - Had a mild fall while putting on pants in the setting of weakness, no trauma or pain - Likely secondary urinary tract infection and COVID infection - PT/OT eval and treat - Fall precautions   # T2DM with hyperglycemia - Last A1c 10.0% 6 months ago, patient with poor glucose control.  Hold p.o. hypoglycemic agent.  Continue with sliding scale insulin  and monitor blood sugars.  # Acute hypomagnesemia -Magnesium  IV 2 g x 1 on admission Recheck   # HTN - BP stable with SBP in the 110-140s - Continue losartan    # HLD - Continue rosuvastatin    # Vascular dementia - At baseline oriented to self and person (can identify spouse but not daughter), not oriented to place, time and situation - Delirium precautions -Patient has a pattern of awakening, eating and taking naps regularly at home   # Lymphoma - Currently under active surveillance, WBC of 25 seems to be around his recent baseline - Follow-up with oncology in the outpatient  Right heel wound History of sacral decubitus Wound 10/02/24 0030 Other (Comment) Heel Right (Active)  Date First Assessed/Time First Assessed: 10/02/24 0030   Present on Original Admission: Yes  Primary Wound Type: (c) Other (Comment)  Location: Heel  Location Orientation: Right    Assessments 10/02/2024  1:16 AM 10/02/2024  9:35 PM  Wound Image     Site / Wound Assessment Yellow;Painful Yellow  Peri-wound Assessment Intact Intact  Drainage Description Other (Comment) --  Drainage Amount Small --  Dressing Type None None     No associated orders.     Sacral Wound      Subjective: Patient was seen at bedside this morning, he is alert and oriented x 1-2 and knows his name and date of birth. Wife and daughter were at bedside.  He states that he has not been coughing.  They stated that patient has constantly ask for a urinal and has not been able to  urinate.  They think he has the urge however he has not been able to void. Physical Exam: Vitals:   10/02/24 0500 10/02/24 1209 10/02/24 2008 10/03/24 0446  BP: 118/62 (!) 100/50 111/60 (!) 146/77  Pulse: 74 81 81 75  Resp: 16 18  16   Temp: 98.6 F (37 C) 99.2 F (37.3 C) 98.4 F (36.9 C) 98.1 F (36.7 C)  TempSrc: Oral Oral    SpO2: 97% 94% 97% 94%  Weight:      Height:       General  No acute Distress Eyes: PERRL, lids and conjunctivae normal ENMT: Mucous membranes are moist.   Neck: normal, supple, no masses, no thyromegaly Respiratory: clear to auscultation bilaterally, no wheezing, no crackles. Normal respiratory effort. No accessory muscle use.  Cardiovascular: Regular rate and rhythm, no murmurs / rubs / gallops Abdomen: Soft, nontender nondistended. Musculoskeletal: no clubbing / cyanosis. No joint deformity upper and lower extremities.  Skin: no rashes, lesions, ulcers. No induration Neurologic: Facial asymmetry, moving extremity spontaneously, speech fluent.  Alert and oriented x 1-2    Data Reviewed: CBC    Component Value Date/Time   WBC 20.4 (H) 10/03/2024 0425   RBC 4.54 10/03/2024 0425   HGB 13.5 10/03/2024 0425   HCT 40.6 10/03/2024 0425   PLT 164 10/03/2024 0425   MCV 89.4 10/03/2024 0425   MCH 29.7 10/03/2024 0425   MCHC 33.3 10/03/2024 0425   RDW 14.0 10/03/2024 0425   LYMPHSABS 16.6 (H) 10/01/2024 1620   MONOABS 1.5 (H) 10/01/2024 1620   EOSABS 0.0 10/01/2024 1620   BASOSABS 0.1 10/01/2024 1620   CMP     Component Value Date/Time   NA 138 10/03/2024 0425   K 3.6 10/03/2024 0425   CL 102 10/03/2024 0425   CO2 23 10/03/2024 0425   GLUCOSE 187 (H) 10/03/2024 0425   BUN 12 10/03/2024 0425   CREATININE 1.05 10/03/2024 0425   CREATININE 1.35 (H) 07/14/2023 1247   CALCIUM  9.1 10/03/2024 0425   PROT 6.3 (L) 10/03/2024 0425   ALBUMIN 3.8 10/03/2024 0425   AST 24 10/03/2024 0425   ALT 10 10/03/2024 0425   ALKPHOS 66 10/03/2024 0425   BILITOT  0.4 10/03/2024 0425   GFR 112.86 03/04/2016 0843   EGFR 50 (L) 07/14/2023 1247   GFRNONAA >60 10/03/2024 0425   GFRNONAA 78 04/12/2021 1507     Family Communication: Wife and daughter at bedside  Disposition: Status is: Inpatient and remains inpatient appropriate because: Acute metabolic encephalopathy in patient with acute UTI and coinfection with COVID-19 with comorbidity of dementia  Planned Discharge Destination: Home with Home Health     Time spent: 37 minutes  Author: Landon FORBES Baller, MD 10/03/2024 2:09 PM  For on call review [email or web address].  "

## 2024-10-03 NOTE — Evaluation (Signed)
 Occupational Therapy Evaluation Patient Details Name: Ryan Gilmore MRN: [id number] DOB: August 04, 1933 Today's Date: 10/03/2024   History of Present Illness   89 yo male admitted with UTI, fall, COVID. Hx of dementia, DM, PE, DVT           Clinical Impressions PTA, patient lives at home with wife and daughter and was amb with rollator and has assist for bathing and dressing. Currently, patient presents with deficits outlined below (see OT Problem List for details) most significantly generaized muscle weakness, decreased cognition, activity tolerance and balance for BADL's and functional mobility performance. Recommending HHOT services with family assist and support upon discharge from acute hospital setting. Patient requires continued Acute care hospital level OT services to progress safety and functional performance and allow for discharge.       If plan is discharge home, recommend the following:   A lot of help with walking and/or transfers;A little help with bathing/dressing/bathroom;Assistance with cooking/housework;Direct supervision/assist for medications management;Direct supervision/assist for financial management;Assist for transportation;Help with stairs or ramp for entrance;Supervision due to cognitive status     Functional Status Assessment   Patient has had a recent decline in their functional status and demonstrates the ability to make significant improvements in function in a reasonable and predictable amount of time.     Equipment Recommendations   None recommended by OT      Precautions/Restrictions   Precautions Precautions: Fall Restrictions Weight Bearing Restrictions Per Provider Order: No     Mobility Bed Mobility Overal bed mobility: Needs Assistance Bed Mobility: Supine to Sit     Supine to sit: Mod assist, +2 for physical assistance, +2 for safety/equipment, HOB elevated     General bed mobility comments: Assist for trunk and bil LEs.  Increased time and effort for pt. Repeated multimodal cueing required.    Transfers Overall transfer level: Needs assistance Equipment used: Rolling walker (2 wheels) Transfers: Sit to/from Stand Sit to Stand: Min assist, +2 physical assistance, +2 safety/equipment, From elevated surface           General transfer comment: Cues for hand placement and cues for safety, use of rollator with assist and cues      Balance Overall balance assessment: Needs assistance, History of Falls Sitting-balance support: Single extremity supported, Feet supported       Standing balance support: Bilateral upper extremity supported, During functional activity, Reliant on assistive device for balance Standing balance-Leahy Scale: Poor Standing balance comment: UE support needed                           ADL either performed or assessed with clinical judgement   ADL Overall ADL's : Needs assistance/impaired Eating/Feeding: Set up;Sitting   Grooming: Wash/dry hands;Wash/dry face;Contact guard assist;Cueing for sequencing;Sitting   Upper Body Bathing: Minimal assistance;Cueing for sequencing;Sitting   Lower Body Bathing: Maximal assistance;Cueing for safety;Cueing for sequencing;Sit to/from stand   Upper Body Dressing : Minimal assistance;Cueing for sequencing   Lower Body Dressing: Maximal assistance;Cueing for safety;Cueing for sequencing                       Vision Baseline Vision/History: 1 Wears glasses;0 No visual deficits              Pertinent Vitals/Pain Pain Assessment Pain Assessment: Faces Faces Pain Scale: No hurt     Extremity/Trunk Assessment Upper Extremity Assessment Upper Extremity Assessment: Right hand dominant;Generalized weakness   Lower Extremity Assessment  Lower Extremity Assessment: Defer to PT evaluation   Cervical / Trunk Assessment Cervical / Trunk Assessment: Kyphotic   Communication Communication Communication: No apparent  difficulties   Cognition Arousal: Alert Behavior During Therapy: Flat affect Cognition: History of cognitive impairments             OT - Cognition Comments: slower processing, cues and support for safety, sequencing, STM and problem solving                 Following commands: Impaired Following commands impaired: Follows one step commands with increased time     Cueing  General Comments   Cueing Techniques: Verbal cues;Gestural cues;Tactile cues;Visual cues  fatigues with >10 ft amb with RW with chair follow this session           Home Living Family/patient expects to be discharged to:: Private residence Living Arrangements: Spouse/significant other Available Help at Discharge: Family;Available 24 hours/day Type of Home: House Home Access: Stairs to enter [employer] of Steps: 3 Entrance Stairs-Rails: Can reach both Home Layout: One level     Bathroom Shower/Tub: Walk-in Pensions Consultant: Standard Bathroom Accessibility: No   Home Equipment: Rollator (4 wheels);Rolling Walker (2 wheels);Shower seat;Grab bars - toilet;Grab bars - tub/shower;Hand held shower head   Additional Comments: daughter Cam and wife present for session      Prior Functioning/Environment Prior Level of Function : Needs assist             Mobility Comments: mod ind with rollator ADLs Comments: assist for bathing, dressing    OT Problem List: Decreased strength;Decreased activity tolerance;Impaired balance (sitting and/or standing);Decreased coordination;Decreased cognition;Decreased safety awareness;Decreased knowledge of use of DME or AE;Decreased knowledge of precautions;Cardiopulmonary status limiting activity   OT Treatment/Interventions: Self-care/ADL training;Therapeutic exercise;Neuromuscular education;Energy conservation;DME and/or AE instruction;Therapeutic activities;Cognitive remediation/compensation;Patient/family education;Balance  training      OT Goals(Current goals can be found in the care plan section)   Acute Rehab OT Goals Patient Stated Goal: to go home OT Goal Formulation: With patient/family Time For Goal Achievement: 10/17/24 Potential to Achieve Goals: Fair ADL Goals Pt Will Perform Grooming: with supervision;sitting Pt Will Perform Upper Body Bathing: with contact guard assist;sitting Pt Will Perform Lower Body Bathing: with mod assist;sitting/lateral leans Pt Will Perform Upper Body Dressing: with min assist;sitting Pt Will Transfer to Toilet: with min assist;ambulating;regular height toilet Pt Will Perform Toileting - Clothing Manipulation and hygiene: with min assist;sitting/lateral leans Pt Will Perform Tub/Shower Transfer: with min assist;shower seat;ambulating;rolling walker Pt/caregiver will Perform Home Exercise Program: With Supervision;With written HEP provided;Both right and left upper extremity;Increased strength   OT Frequency:  Min 2X/week       AM-PAC OT 6 Clicks Daily Activity     Outcome Measure Help from another person eating meals?: A Little Help from another person taking care of personal grooming?: A Little Help from another person toileting, which includes using toliet, bedpan, or urinal?: A Lot Help from another person bathing (including washing, rinsing, drying)?: A Lot Help from another person to put on and taking off regular upper body clothing?: A Lot Help from another person to put on and taking off regular lower body clothing?: A Lot 6 Click Score: 14   End of Session Equipment Utilized During Treatment: Gait belt;Rollator (4 wheels) Nurse Communication: Mobility status  Activity Tolerance: Patient limited by fatigue Patient left: in chair;with call bell/phone within reach;with chair alarm set;with family/visitor present  OT Visit Diagnosis: Unsteadiness on feet (R26.81);Muscle weakness (generalized) (M62.81);Cognitive communication  deficit (R41.841)                 Time: 8447-8369 OT Time Calculation (min): 38 min Charges:  OT General Charges $OT Visit: 1 Visit OT Evaluation $OT Eval Low Complexity: 1 Low OT Treatments $Self Care/Home Management : 8-22 mins Nessa Ramaker OT/L Acute Rehabilitation Department  [phone]  10/03/2024, 6:10 PM

## 2024-10-03 NOTE — TOC Initial Note (Signed)
 Transition of Care [facility]) - Initial/Assessment Note    Patient Details  Name: Ryan Gilmore MRN: [id number] Date of Birth: 1933-02-05  Transition of Care [facility]) CM/SW Contact:    Doneta Glenys DASEN, RN Phone Number: 10/03/2024, 12:34 PM  Clinical Narrative:                 CM introduced and explained role to patient, Mildred(wife) and Cam (dlt) that were present in room. Mildred and Cam agreeable to [facility]. Will sent referrals via HUB. IP CM following  Expected Discharge Plan: Home w Home Health Services Barriers to Discharge: Continued Medical Work up   Patient Goals and CMS Choice Patient states their goals for this hospitalization and ongoing recovery are:: Home with [facility] CMS Medicare.gov Compare Post Acute Care list provided to:: Patient Represenative (must comment) (Cam) Choice offered to / list presented to : Adult Children, Spouse      Expected Discharge Plan and Services In-house Referral: NA Discharge Planning Services: CM Consult Post Acute Care Choice: NA Living arrangements for the past 2 months: Single Family Home                 DME Arranged: N/A DME Agency: NA       HH Arranged: NA HH Agency: NA        Prior Living Arrangements/Services Living arrangements for the past 2 months: Single Family Home Lives with:: Spouse Patient language and need for interpreter reviewed:: Yes        Need for Family Participation in Patient Care: Yes (Comment) Care giver support system in place?: Yes (comment) Current home services: DME (walker,rollator,BSC) Criminal Activity/Legal Involvement Pertinent to Current Situation/Hospitalization: No - Comment as needed  Activities of Daily Living   ADL Screening (condition at time of admission) Independently performs ADLs?: No Does the patient have a NEW difficulty with bathing/dressing/toileting/self-feeding that is expected to last >3 days?: No Does the patient have a NEW difficulty with getting in/out of bed, walking, or  climbing stairs that is expected to last >3 days?: Yes (Initiates electronic notice to provider for possible PT consult) Does the patient have a NEW difficulty with communication that is expected to last >3 days?: No Is the patient deaf or have difficulty hearing?: No Does the patient have difficulty seeing, even when wearing glasses/contacts?: Yes Does the patient have difficulty concentrating, remembering, or making decisions?: Yes  Permission Sought/Granted   Permission granted to share information with : Yes, Verbal Permission Granted (Mildred(wife) and Cam (dlt))              Emotional Assessment Appearance:: Appears stated age Attitude/Demeanor/Rapport: Unable to Assess Affect (typically observed): Unable to Assess Orientation: : Oriented to Self, Fluctuating Orientation (Suspected and/or reported Sundowners) Alcohol / Substance Use: Not Applicable Psych Involvement: No (comment)  Admission diagnosis:  UTI (urinary tract infection) [N39.0] Acute cystitis without hematuria [N30.00] Generalized weakness [R53.1] Fall at home, initial encounter [W19.CHERENE, Y92.009] Patient Active Problem List   Diagnosis Date Noted   Fall at home, initial encounter 10/02/2024   Generalized weakness 10/02/2024   COVID-19 virus infection 10/02/2024   Severe vascular dementia (HCC) 10/02/2024   UTI (urinary tract infection) 06/12/2022   Prostate cancer (HCC) 06/12/2022   Lymphoma (HCC) 06/12/2022   Hyperglycemia due to diabetes mellitus (HCC) 06/12/2022   DVT, recurrent, lower extremity, chronic, bilateral    Hypomagnesemia    Palliative care by specialist    DNR (do not resuscitate) discussion    Acute hypoxemic respiratory failure (HCC)  Acute on chronic respiratory failure with hypoxia (HCC)    Vascular dementia without behavioral disturbance (HCC)    FTT (failure to thrive) in adult    Acute pulmonary embolism without acute cor pulmonale (HCC) 04/26/2019   Essential hypertension  04/26/2019   AKI (acute kidney injury) 04/26/2019   Chronic midline low back pain without sciatica 10/12/2017   Diabetes mellitus type 2, noninsulin dependent (HCC) 06/28/2013   Mixed hyperlipidemia 06/28/2013   PCP:  Duanne Butler DASEN, MD Pharmacy:   [facility] [address] ([place]), [place] - [address] [place]) [place] [address] Phone: [phone] Fax: [phone]     Social Drivers of Health (SDOH) Social History: SDOH Screenings   Food Insecurity: No Food Insecurity (10/02/2024)  Housing: Low Risk (10/02/2024)  Transportation Needs: No Transportation Needs (10/02/2024)  Utilities: Not At Risk (10/02/2024)  Alcohol Screen: Low Risk (07/16/2023)  Depression (PHQ2-9): Low Risk (06/02/2024)  Financial Resource Strain: Low Risk (07/16/2023)  Physical Activity: Inactive (07/16/2023)  Social Connections: Moderately Isolated (10/02/2024)  Stress: No Stress Concern Present (07/16/2023)  Tobacco Use: Medium Risk (10/02/2024)  Health Literacy: Adequate Health Literacy (07/16/2023)   SDOH Interventions:     Readmission Risk Interventions    10/03/2024   12:31 PM  Readmission Risk Prevention Plan  Post Dischage Appt Complete  Medication Screening Complete  Transportation Screening Complete

## 2024-10-03 NOTE — Plan of Care (Signed)
" °  Problem: Education: Goal: Ability to describe self-care measures that may prevent or decrease complications (Diabetes Survival Skills Education) will improve Outcome: Progressing Goal: Individualized Educational Video(s) Outcome: Progressing   Problem: Coping: Goal: Ability to adjust to condition or change in health will improve Outcome: Progressing   Problem: Fluid Volume: Goal: Ability to maintain a balanced intake and output will improve Outcome: Progressing   Problem: Health Behavior/Discharge Planning: Goal: Ability to identify and utilize available resources and services will improve Outcome: Progressing Goal: Ability to manage health-related needs will improve Outcome: Progressing   Problem: Nutritional: Goal: Maintenance of adequate nutrition will improve Outcome: Progressing Goal: Progress toward achieving an optimal weight will improve Outcome: Progressing   Problem: Nutritional: Goal: Progress toward achieving an optimal weight will improve Outcome: Progressing   Problem: Clinical Measurements: Goal: Ability to maintain clinical measurements within normal limits will improve Outcome: Progressing Goal: Will remain free from infection Outcome: Progressing Goal: Diagnostic test results will improve Outcome: Progressing Goal: Respiratory complications will improve Outcome: Progressing Goal: Cardiovascular complication will be avoided Outcome: Progressing   Problem: Health Behavior/Discharge Planning: Goal: Ability to manage health-related needs will improve Outcome: Progressing   Problem: Safety: Goal: Ability to remain free from injury will improve Outcome: Progressing   "

## 2024-10-04 ENCOUNTER — Inpatient Hospital Stay (HOSPITAL_COMMUNITY)

## 2024-10-04 DIAGNOSIS — Y92009 Unspecified place in unspecified non-institutional (private) residence as the place of occurrence of the external cause: Secondary | ICD-10-CM | POA: Diagnosis not present

## 2024-10-04 DIAGNOSIS — U071 COVID-19: Secondary | ICD-10-CM | POA: Diagnosis not present

## 2024-10-04 DIAGNOSIS — W19XXXA Unspecified fall, initial encounter: Secondary | ICD-10-CM | POA: Diagnosis not present

## 2024-10-04 DIAGNOSIS — R531 Weakness: Secondary | ICD-10-CM | POA: Diagnosis not present

## 2024-10-04 LAB — COMPREHENSIVE METABOLIC PANEL WITH GFR
ALT: 10 U/L (ref 0–44)
ALT: 8 U/L (ref 0–44)
AST: 29 U/L (ref 15–41)
AST: 26 U/L (ref 15–41)
Albumin: 3.8 g/dL (ref 3.5–5.0)
Albumin: 3.4 g/dL — ABNORMAL LOW (ref 3.5–5.0)
Alkaline Phosphatase: 71 U/L (ref 38–126)
Alkaline Phosphatase: 64 U/L (ref 38–126)
Anion gap: 18 — ABNORMAL HIGH (ref 5–15)
Anion gap: 11 (ref 5–15)
BUN: 24 mg/dL — ABNORMAL HIGH (ref 8–23)
BUN: 33 mg/dL — ABNORMAL HIGH (ref 8–23)
CO2: 20 mmol/L — ABNORMAL LOW (ref 22–32)
CO2: 22 mmol/L (ref 22–32)
Calcium: 9.2 mg/dL (ref 8.9–10.3)
Calcium: 8.4 mg/dL — ABNORMAL LOW (ref 8.9–10.3)
Chloride: 99 mmol/L (ref 98–111)
Chloride: 103 mmol/L (ref 98–111)
Creatinine, Ser: 2.46 mg/dL — ABNORMAL HIGH (ref 0.61–1.24)
Creatinine, Ser: 4.16 mg/dL — ABNORMAL HIGH (ref 0.61–1.24)
GFR, Estimated: 24 mL/min — ABNORMAL LOW
GFR, Estimated: 13 mL/min — ABNORMAL LOW
Glucose, Bld: 248 mg/dL — ABNORMAL HIGH (ref 70–99)
Glucose, Bld: 188 mg/dL — ABNORMAL HIGH (ref 70–99)
Potassium: 4.2 mmol/L (ref 3.5–5.1)
Potassium: 4.1 mmol/L (ref 3.5–5.1)
Sodium: 137 mmol/L (ref 135–145)
Sodium: 137 mmol/L (ref 135–145)
Total Bilirubin: 0.4 mg/dL (ref 0.0–1.2)
Total Bilirubin: 0.3 mg/dL (ref 0.0–1.2)
Total Protein: 6.5 g/dL (ref 6.5–8.1)
Total Protein: 6 g/dL — ABNORMAL LOW (ref 6.5–8.1)

## 2024-10-04 LAB — URINE CULTURE: Culture: >=100000 — AB

## 2024-10-04 LAB — BLOOD GAS, ARTERIAL
Acid-Base Excess: 2 mmol/L (ref 0.0–2.0)
Bicarbonate: 25.6 mmol/L (ref 20.0–28.0)
Drawn by: 25780
O2 Saturation: 96.6 %
Patient temperature: 37.9
pCO2 arterial: 37 mmHg (ref 32–48)
pH, Arterial: 7.45 (ref 7.35–7.45)
pO2, Arterial: 75 mmHg — ABNORMAL LOW (ref 83–108)

## 2024-10-04 LAB — CBC
HCT: 41.4 % (ref 39.0–52.0)
Hemoglobin: 13.8 g/dL (ref 13.0–17.0)
MCH: 29.5 pg (ref 26.0–34.0)
MCHC: 33.3 g/dL (ref 30.0–36.0)
MCV: 88.5 fL (ref 80.0–100.0)
Platelets: 170 K/uL (ref 150–400)
RBC: 4.68 MIL/uL (ref 4.22–5.81)
RDW: 13.9 % (ref 11.5–15.5)
WBC: 20.7 K/uL — ABNORMAL HIGH (ref 4.0–10.5)
nRBC: 0 % (ref 0.0–0.2)

## 2024-10-04 LAB — PROCALCITONIN: Procalcitonin: 0.27 ng/mL

## 2024-10-04 LAB — TSH: TSH: 0.948 u[IU]/mL (ref 0.350–4.500)

## 2024-10-04 LAB — GLUCOSE, CAPILLARY
Glucose-Capillary: 279 mg/dL — ABNORMAL HIGH (ref 70–99)
Glucose-Capillary: 354 mg/dL — ABNORMAL HIGH (ref 70–99)
Glucose-Capillary: 404 mg/dL — ABNORMAL HIGH (ref 70–99)
Glucose-Capillary: 148 mg/dL — ABNORMAL HIGH (ref 70–99)

## 2024-10-04 LAB — C-REACTIVE PROTEIN: CRP: 4.8 mg/dL — ABNORMAL HIGH

## 2024-10-04 LAB — AMMONIA: Ammonia: 37 umol/L — ABNORMAL HIGH (ref 9–35)

## 2024-10-04 MED ORDER — ENOXAPARIN SODIUM 30 MG/0.3ML IJ SOSY
30.0000 mg | PREFILLED_SYRINGE | INTRAMUSCULAR | Status: DC
  Administered 2024-10-05 – 2024-10-06 (×2): 30 mg via SUBCUTANEOUS
  Filled 2024-10-04 (×2): qty 0.3

## 2024-10-04 MED ORDER — PIPERACILLIN-TAZOBACTAM 3.375 G IVPB 30 MIN: 3.3750 g | Freq: Three times a day (TID) | INTRAVENOUS | Status: DC

## 2024-10-04 MED ORDER — AMOXICILLIN 500 MG PO CAPS
500.0000 mg | ORAL_CAPSULE | Freq: Two times a day (BID) | ORAL | Status: DC
  Administered 2024-10-04: 500 mg via ORAL
  Filled 2024-10-04 (×2): qty 1

## 2024-10-04 MED ORDER — SODIUM CHLORIDE 0.9 % IV BOLUS
1000.0000 mL | Freq: Once | INTRAVENOUS | Status: AC
  Administered 2024-10-04: 1000 mL via INTRAVENOUS

## 2024-10-04 MED ORDER — SODIUM CHLORIDE 0.9 % IV SOLN
2.0000 g | Freq: Three times a day (TID) | INTRAVENOUS | Status: DC
  Administered 2024-10-04 – 2024-10-05 (×3): 2 g via INTRAVENOUS
  Filled 2024-10-04 (×3): qty 2000

## 2024-10-04 MED ORDER — SODIUM CHLORIDE 0.9 % IV SOLN: INTRAVENOUS | Status: AC

## 2024-10-04 MED ORDER — AMLODIPINE BESYLATE 10 MG PO TABS
10.0000 mg | ORAL_TABLET | Freq: Every day | ORAL | Status: DC
  Administered 2024-10-04 – 2024-10-05 (×2): 10 mg via ORAL
  Filled 2024-10-04 (×2): qty 1

## 2024-10-04 NOTE — TOC Progression Note (Addendum)
 Transition of Care [facility]) - Progression Note    Patient Details  Name: Ryan Gilmore MRN: [id number] Date of Birth: July 28, 1933  Transition of Care [facility]) CM/SW Contact  Doneta Glenys DASEN, RN Phone Number: 10/04/2024, 9:22 AM  Clinical Narrative:    10:26 AM Family has chosen [facility]. 9:22 AM CM presented Choice List Service Provider Request Status Address Patient Preferred  [facility])  Accepted [facility]   [facility])  Accepted [address], [place] [address]  [facility])  Accepted [address], [place] [address]     Expected Discharge Plan: Home w Home Health Services Barriers to Discharge: Continued Medical Work up               Expected Discharge Plan and Services In-house Referral: NA Discharge Planning Services: CM Consult Post Acute Care Choice: NA Living arrangements for the past 2 months: Single Family Home                 DME Arranged: N/A DME Agency: NA       HH Arranged: NA HH Agency: NA         Social Drivers of Health (SDOH) Interventions SDOH Screenings   Food Insecurity: No Food Insecurity (10/02/2024)  Housing: Low Risk (10/02/2024)  Transportation Needs: No Transportation Needs (10/02/2024)  Utilities: Not At Risk (10/02/2024)  Alcohol Screen: Low Risk (07/16/2023)  Depression (PHQ2-9): Low Risk (06/02/2024)  Financial Resource Strain: Low Risk (07/16/2023)  Physical Activity: Inactive (07/16/2023)  Social Connections: Moderately Isolated (10/02/2024)  Stress: No Stress Concern Present (07/16/2023)  Tobacco Use: Medium Risk (10/02/2024)  Health Literacy: Adequate Health Literacy (07/16/2023)    Readmission Risk Interventions    10/03/2024   12:31 PM  Readmission Risk Prevention Plan  Post Dischage Appt Complete  Medication Screening Complete  Transportation Screening Complete

## 2024-10-04 NOTE — Plan of Care (Signed)
   Problem: Metabolic: Goal: Ability to maintain appropriate glucose levels will improve Outcome: Progressing   Problem: Nutritional: Goal: Maintenance of adequate nutrition will improve Outcome: Progressing Goal: Progress toward achieving an optimal weight will improve Outcome: Progressing

## 2024-10-04 NOTE — Plan of Care (Signed)
  Problem: Education: Goal: Ability to describe self-care measures that may prevent or decrease complications (Diabetes Survival Skills Education) will improve Outcome: Progressing   Problem: Coping: Goal: Ability to adjust to condition or change in health will improve Outcome: Progressing   Problem: Health Behavior/Discharge Planning: Goal: Ability to identify and utilize available resources and services will improve Outcome: Progressing

## 2024-10-04 NOTE — Progress Notes (Addendum)
 " Progress Note   Patient: Ryan Gilmore FMW:[id number] DOB: 03-Oct-1933 DOA: 10/01/2024     3 DOS: the patient was seen and examined on 10/04/2024   Brief hospital course:  Ryan Gilmore is a 89 y.o. 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  Per daughter, patient lives with his spouse and was putting on his pants in the morning when he fell. His spouse who also have dementia attempted to pick him up but was unsuccessful and patient laid on the floor for more than 4 hours before daughter arrived at the house.  Later in the afternoon, patient CBG was elevated to 470 and he felt warm to touch. Multiple family members have had the cough and cold over the last few days the patient started coughing so she presented him to the ED for further evaluation.  Patient currently denies any pain, chest pain or shortness of breath.  At baseline, he is only oriented to self and occasionally person (able to identify spouse and sometimes daughter). He is not oriented to place, time or situation.   ED Course: Initial vitals show temp 99.8, HR 70-80, SBP 110-140s, SpO2 94% on room air. Initial labs significant for WBC 25, blood glucose 361, normal renal function, CK 293, UA shows significant glucosuria, large hemoglobinuria, negative nitrite, small leuks, RBC >50, WBC >50 and few bacteria. EKG shows sinus rhythm. CXR shows no active disease. Pt received IV LR 1 L bolus and IV Rocephin . TRH was consulted for admission.   Assessment and Plan: # Acute cystitis - Pt presented with a fall in the setting of generalized weakness - Urinalysis shows signs of infection- - Urine cultures positive for Enterococcus fecalis -Switched form Rocephine to Ampicillin  500mg  BID for 5 days.  #AKI Acute bump in serum creatinine, from 1.0 to 2.4 Suspect Pre-renal etiology in setting of poor  oral intake Start IV hydration, obtain renal ultrasound Holding losartan , oral hypoglycemic agents.  Acute encephalopathy Patient has been sleeping and lethargic  all day, suspect metabolic etiology, obtain ABG, ammonia level, TSH.   #Hx of Lymphoma -Patient with Hx of lymphoma, [facility].WBC of 25 seems to be around his recent baseline -Daughter states his WBC count is chronically elevated.  # COVID-19 infection Patient remains on room air, he is not in any respiratory distress. His chest x-ray shows no evidence of pneumonia.  No indication for steroids or antivirals at this time. Continue droplet and contact precautions.  # Fall # Generalized weakness - Had a mild fall while putting on pants in the setting of weakness, no trauma or pain - Likely secondary urinary tract infection and COVID infection - PT/OT eval and treat - Fall precautions   # T2DM with hyperglycemia - Last A1c 10.0% 6 months ago, patient with poor glucose control.  Hold p.o. hypoglycemic agent.  Continue with sliding scale insulin  and monitor blood sugars.  # Acute hypomagnesemia -Magnesium  IV 2 g x 1 on admission Recheck   # HTN - BP stable with SBP in the 110-140s - Continue losartan    # HLD - Continue rosuvastatin    # Vascular dementia - At baseline oriented to self and person (can identify spouse but not daughter), not oriented to place, time and situation - Delirium precautions -Patient has a pattern of awakening, eating and taking naps regularly at home  Right heel wound History of sacral decubitus  Stage 2 Pressure Injury coccyx and medial buttocks pink dry. Wound care following. Pressure Injury POA: Yes Cleanse buttocks/sacrum/coccyx with soap and water, apply silicone foam to sacrococcygeal area.  Lift foam daily to assess area, change foam q3 days and prn soiling.      Subjective: Patient was seen at bedside this morning, He was drowsy but arousable, wife  tells me he had a good night without any overnight events. Daughter tells me he naps a lot during the day. I came back in the afternoon and patient was still asleep, RN tells me he woke up and took his meds and slept off again.   Discussed my concerns with family as patient was lethargic all morning and afternoon, they stated that this was normal for him to sleep most of the day. Would check ABG   Physical Exam: Vitals:   10/03/24 0446 10/03/24 1421 10/03/24 2113 10/04/24 0533  BP: (!) 146/77 (!) 155/78 (!) 146/73 (!) 145/98  Pulse: 75 75 74 77  Resp: 16 14 19 15   Temp: 98.1 F (36.7 C) 98.1 F (36.7 C) 98.4 F (36.9 C) 98.2 F (36.8 C)  TempSrc:  Oral Axillary Axillary  SpO2: 94% 95% 93% 94%  Weight:      Height:       General  No acute Distress Eyes: PERRL, lids and conjunctivae normal ENMT: Mucous membranes are moist.   Neck: normal, supple, no masses, no thyromegaly Respiratory: clear to auscultation bilaterally, no wheezing, no crackles. Normal respiratory effort. No accessory muscle use.  Cardiovascular: Regular rate and rhythm, no murmurs / rubs / gallops Abdomen: Soft, nontender nondistended. Musculoskeletal: no clubbing / cyanosis. No joint deformity upper and lower extremities.  Skin: no rashes, lesions, ulcers. No induration Neurologic: Facial asymmetry, moving extremity spontaneously, speech fluent.  Alert and oriented x 1-2    Data Reviewed: CBC    Component Value Date/Time   WBC 20.7 (H) 10/04/2024 0426   RBC 4.68 10/04/2024 0426   HGB 13.8 10/04/2024 0426   HCT 41.4 10/04/2024 0426   PLT 170 10/04/2024 0426   MCV 88.5 10/04/2024 0426   MCH 29.5 10/04/2024 0426   MCHC 33.3 10/04/2024 0426   RDW 13.9 10/04/2024 0426   LYMPHSABS 16.6 (H) 10/01/2024 1620   MONOABS 1.5 (H) 10/01/2024 1620   EOSABS 0.0 10/01/2024 1620   BASOSABS 0.1 10/01/2024 1620   CMP     Component Value Date/Time   NA 137 10/04/2024 0426   K 4.2 10/04/2024 0426   CL 99 10/04/2024  0426   CO2 20 (L) 10/04/2024 0426   GLUCOSE 248 (H) 10/04/2024 0426   BUN 24 (H) 10/04/2024 0426   CREATININE 2.46 (H) 10/04/2024 0426   CREATININE 1.35 (H) 07/14/2023 1247   CALCIUM  9.2 10/04/2024 0426   PROT 6.5 10/04/2024 0426   ALBUMIN 3.8 10/04/2024 0426   AST 29 10/04/2024 0426   ALT 10 10/04/2024 0426   ALKPHOS 71 10/04/2024 0426   BILITOT 0.4 10/04/2024 0426   GFR 112.86 03/04/2016 0843   EGFR 50 (L) 07/14/2023 1247   GFRNONAA 24 (L) 10/04/2024 0426   GFRNONAA 78 04/12/2021 1507     Family Communication: Wife and daughter at bedside  Disposition: Status is: Inpatient and remains inpatient appropriate because: Acute metabolic encephalopathy in patient with acute UTI and coinfection with COVID-19 with comorbidity of dementia  Planned Discharge Destination: Home with Home Health     Time spent: 37 minutes  Author: Landon FORBES Baller, MD 10/04/2024 12:43 PM  For on call review [email or web address]  "

## 2024-10-05 DIAGNOSIS — R338 Other retention of urine: Secondary | ICD-10-CM | POA: Insufficient documentation

## 2024-10-05 DIAGNOSIS — L8990 Pressure ulcer of unspecified site, unspecified stage: Secondary | ICD-10-CM | POA: Diagnosis present

## 2024-10-05 LAB — GLUCOSE, CAPILLARY
Glucose-Capillary: 302 mg/dL — ABNORMAL HIGH (ref 70–99)
Glucose-Capillary: 310 mg/dL — ABNORMAL HIGH (ref 70–99)
Glucose-Capillary: 241 mg/dL — ABNORMAL HIGH (ref 70–99)
Glucose-Capillary: 179 mg/dL — ABNORMAL HIGH (ref 70–99)

## 2024-10-05 LAB — BASIC METABOLIC PANEL WITH GFR
Anion gap: 15 (ref 5–15)
BUN: 36 mg/dL — ABNORMAL HIGH (ref 8–23)
CO2: 20 mmol/L — ABNORMAL LOW (ref 22–32)
Calcium: 8.3 mg/dL — ABNORMAL LOW (ref 8.9–10.3)
Chloride: 101 mmol/L (ref 98–111)
Creatinine, Ser: 5.12 mg/dL — ABNORMAL HIGH (ref 0.61–1.24)
GFR, Estimated: 10 mL/min — ABNORMAL LOW
Glucose, Bld: 232 mg/dL — ABNORMAL HIGH (ref 70–99)
Potassium: 4.2 mmol/L (ref 3.5–5.1)
Sodium: 136 mmol/L (ref 135–145)

## 2024-10-05 LAB — CBC
HCT: 40.3 % (ref 39.0–52.0)
Hemoglobin: 13.7 g/dL (ref 13.0–17.0)
MCH: 30.1 pg (ref 26.0–34.0)
MCHC: 34 g/dL (ref 30.0–36.0)
MCV: 88.6 fL (ref 80.0–100.0)
Platelets: 164 K/uL (ref 150–400)
RBC: 4.55 MIL/uL (ref 4.22–5.81)
RDW: 14.2 % (ref 11.5–15.5)
WBC: 20.8 K/uL — ABNORMAL HIGH (ref 4.0–10.5)
nRBC: 0 % (ref 0.0–0.2)

## 2024-10-05 MED ORDER — TAMSULOSIN HCL 0.4 MG PO CAPS
0.4000 mg | ORAL_CAPSULE | Freq: Every day | ORAL | Status: DC
  Administered 2024-10-05: 0.4 mg via ORAL
  Filled 2024-10-05: qty 1

## 2024-10-05 MED ORDER — AMOXICILLIN 250 MG PO CAPS
250.0000 mg | ORAL_CAPSULE | Freq: Two times a day (BID) | ORAL | Status: DC
  Administered 2024-10-05 – 2024-10-06 (×2): 250 mg via ORAL
  Filled 2024-10-05 (×2): qty 1

## 2024-10-05 MED ORDER — ROSUVASTATIN CALCIUM 10 MG PO TABS
10.0000 mg | ORAL_TABLET | Freq: Every day | ORAL | Status: DC
  Administered 2024-10-05 – 2024-10-06 (×2): 10 mg via ORAL
  Filled 2024-10-05 (×2): qty 1

## 2024-10-05 MED ORDER — CHLORHEXIDINE GLUCONATE CLOTH 2 % EX PADS
6.0000 | MEDICATED_PAD | Freq: Every day | CUTANEOUS | Status: DC
  Administered 2024-10-05 – 2024-10-06 (×2): 6 via TOPICAL

## 2024-10-05 NOTE — Assessment & Plan Note (Addendum)
 Patient remains on room air, he is not in any respiratory distress His chest x-ray shows no evidence of pneumonia No indication for steroids or antivirals at this time Continue droplet and contact precautions

## 2024-10-05 NOTE — Assessment & Plan Note (Addendum)
 Patient with Hx of [facility] WBC of 25 seems to be around his recent baseline Daughter states his WBC count is chronically elevated

## 2024-10-05 NOTE — Assessment & Plan Note (Addendum)
 Stage 2 Pressure Injury coccyx and medial buttocks pink dry, POA Also with thick hyperkeratotic skin to R heel, POA Wound care consulted Cleanse buttocks/sacrum/coccyx with soap and water, apply silicone foam to sacrococcygeal area.  Lift foam daily to assess area, change foam q3 days and prn soiling. Cleanse B feet with soap and water, dry and apply Eucerin cream 2 times daily. Free float heels using pillows to offload pressure.

## 2024-10-05 NOTE — Assessment & Plan Note (Addendum)
"   Continue rosuvastatin          "

## 2024-10-05 NOTE — Assessment & Plan Note (Addendum)
 Had a mild fall while putting on pants in the setting of weakness Likely secondary urinary tract infection and COVID infection PT/OT consulted Recommended for home PT/OT

## 2024-10-05 NOTE — Progress Notes (Signed)
 Physical Therapy Treatment Patient Details Name: Ryan Gilmore MRN: [id number] DOB: 04/10/1933 Today's Date: 10/05/2024   History of Present Illness 89 yo male admitted with UTI, fall, COVID. Hx of dementia, DM, PE, DVT    PT Comments   Pt admitted with above diagnosis.  Pt currently with functional limitations due to the deficits listed below (see PT Problem List). Pt in bed when PT arrived. Pt's family present and pt agreeable to therapy intervention. Pt required increased A for bed mobility with max A x 2 and use of hospital bed features, sitting balance with max/mod A with strong posterior lean and use of rocking techniques to decrease extensor tone seated EOB with pt able to progress to CGA for balance and attention to midline, sit to stand  from EOB to RW with min A x 2 posterior lean and narrow BOS with facilitation and cues standing balance progressed to CGA x 1 with B UE support at RW, pt able to progress with gait tasks 25 feet with RW, min A x 1 to CGA x 1 with recliner close and cues. Pt left seated in recliner, all needs in place and family present. PT discussed required physical assist for functional mobility tasks and family indicated they would be able to provide some physical assist, at baseline pt required S and cues for mobility. Pt is not currently at [facility] and will benefit from acute skilled PT to increase their independence and safety with mobility to allow discharge home with family support and ongoing therapy services though [facility].      If plan is discharge home, recommend the following: A lot of help with walking and/or transfers;A lot of help with bathing/dressing/bathroom;Assistance with cooking/housework;Assist for transportation;Help with stairs or ramp for entrance;Direct supervision/assist for medications management;Direct supervision/assist for financial management   Can travel by private vehicle        Equipment Recommendations  None recommended by PT     Recommendations for Other Services       Precautions / Restrictions Precautions Precautions: Fall Restrictions Weight Bearing Restrictions Per Provider Order: No     Mobility  Bed Mobility Overal bed mobility: Needs Assistance Bed Mobility: Supine to Sit     Supine to sit: +2 for physical assistance, +2 for safety/equipment, HOB elevated, Max assist     General bed mobility comments: pt indicated R LE pain with PT assisting LE to EOB and supine to sit completed with use of bed pad and max A x 2 with limited initiation of movement, once seated EOB pt exhibited strong posterior lean and noted to have increased tone in trunk PT guided pt though trunk flexion and lateral movements and slight trunk rotation to progress from mod/max sitting balance to CGA and cues with attention to midline and no overt LOB seated    Transfers Overall transfer level: Needs assistance Equipment used: Rolling walker (2 wheels) Transfers: Sit to/from Stand Sit to Stand: Min assist, +2 physical assistance, +2 safety/equipment, From elevated surface           General transfer comment: pull to stand from elevated EOB with min A x 2 and cues with posterior lean and narrow BOS with PT pushing Rw anteriorly and providing cues for proper LE placement with pt able to progress from min A x 2 for standing balance to min A x 1 with B UE support at [facility]    Ambulation/Gait Ambulation/Gait assistance: Min assist, Contact guard assist Gait Distance (Feet): 25 Feet Assistive device: Rolling  walker (2 wheels) Gait Pattern/deviations: Decreased stride length, Step-to pattern, Decreased dorsiflexion - right, Decreased dorsiflexion - left, Narrow base of support, Trunk flexed Gait velocity: decreased     General Gait Details: Once pt guided to upright standing and faciltiation for balance at RW pt able to progress from min A x 1 to CGA with recliner close, cues for attention, posture, proper body position from RW, and  RW management with pt exhibiting slight trunk flexion and shuffling like pattern   Stairs             Wheelchair Mobility     Tilt Bed    Modified Rankin (Stroke Patients Only)       Balance Overall balance assessment: Needs assistance, History of Falls Sitting-balance support: Feet supported Sitting balance-Leahy Scale: Poor     Standing balance support: Bilateral upper extremity supported, During functional activity, Reliant on assistive device for balance Standing balance-Leahy Scale: Poor Standing balance comment: UE support at RW and min A x 2 progressing to CGA x 1 with strong posterior lean and narrow BOS                            Communication Communication Communication: No apparent difficulties;Impaired Factors Affecting Communication: Difficulty expressing self  Cognition Arousal: Alert Behavior During Therapy: Flat affect   PT - Cognitive impairments: History of cognitive impairments                         Following commands: Impaired Following commands impaired: Follows one step commands with increased time    Cueing Cueing Techniques: Verbal cues, Gestural cues, Tactile cues, Visual cues  Exercises      General Comments        Pertinent Vitals/Pain Pain Assessment Pain Assessment: Faces Faces Pain Scale: Hurts little more Pain Location: R LE with supine to sit Pain Intervention(s): Monitored during session, Repositioned    Home Living                          Prior Function            PT Goals (current goals can now be found in the care plan section) Acute Rehab PT Goals Patient Stated Goal: family planning for return home PT Goal Formulation: With family Time For Goal Achievement: 10/16/24 Potential to Achieve Goals: Good Progress towards PT goals: Progressing toward goals    Frequency    Min 3X/week      PT Plan      Co-evaluation              AM-PAC PT 6 Clicks Mobility    Outcome Measure  Help needed turning from your back to your side while in a flat bed without using bedrails?: A Lot Help needed moving from lying on your back to sitting on the side of a flat bed without using bedrails?: A Lot Help needed moving to and from a bed to a chair (including a wheelchair)?: A Lot Help needed standing up from a chair using your arms (e.g., wheelchair or bedside chair)?: A Lot Help needed to walk in hospital room?: A Lot Help needed climbing 3-5 steps with a railing? : A Lot 6 Click Score: 12    End of Session Equipment Utilized During Treatment: Gait belt Activity Tolerance: Patient tolerated treatment well Patient left: in chair;with call bell/phone within reach;with family/visitor present;with chair  alarm set Nurse Communication: Mobility status PT Visit Diagnosis: Muscle weakness (generalized) (M62.81);Difficulty in walking, not elsewhere classified (R26.2);History of falling (Z91.81);Unsteadiness on feet (R26.81)     Time: 8966-8942 PT Time Calculation (min) (ACUTE ONLY): 24 min  Charges:    $Gait Training: 8-22 mins $Therapeutic Activity: 8-22 mins PT General Charges $$ ACUTE PT VISIT: 1 Visit                     [place], PT Acute Rehab    [place] VEAR Drone 10/05/2024, 2:15 PM

## 2024-10-05 NOTE — Assessment & Plan Note (Addendum)
 Acute bump in serum creatinine, from 1.0 to 2.4 to 4.16 to 5.12 Started IV hydration Holding losartan , oral hypoglycemic agents Bladder scan showed acute urinary retention, >1L urine retained Foley placed with 1200cc immediate urine return Starting tamsulosin Will need dc with foley in place and outpatient f/u with urology Recheck BMP this PM; if improving, family would prefer to take patient home today

## 2024-10-05 NOTE — Hospital Course (Signed)
 89yo with h/o vascular dementia, T2DM, HTN, 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  CBG elevated to 470, +URI symptoms.  Noted to be positive for COVID-19.  Also with E. Faecalis UTI, started on Ceftriaxone  -> Ampicillin .  +AKI on presentation.

## 2024-10-05 NOTE — Inpatient Diabetes Management (Signed)
 Inpatient Diabetes Program Recommendations  AACE/ADA: New Consensus Statement on Inpatient Glycemic Control (2015)  Target Ranges:  Prepandial:   less than 140 mg/dL      Peak postprandial:   less than 180 mg/dL (1-2 hours)      Critically ill patients:  140 - 180 mg/dL   Lab Results  Component Value Date   GLUCAP 310 (H) 10/05/2024   HGBA1C 10.3 (H) 10/02/2024    Review of Glycemic Control  Diabetes history: DM2 Outpatient Diabetes medications: Jardiance 25 mg daily, metformin  1000 mg BID Current orders for Inpatient glycemic control: Novolog  0-15 TID with meals and 0-5 HS  HgbA1C - 10.3% CBGs 302, 310 mg/dL today  Inpatient Diabetes Program Recommendations:    Consider adding Semglee  10 units at bedtime  If post-prandials > 180 mg/dL, add Novolog  3 units TID with meals if eating > 50%  Continue to follow.  Thank you. Shona Brandy, RD, LDN, CDCES Inpatient Diabetes Coordinator [phone]

## 2024-10-05 NOTE — Assessment & Plan Note (Deleted)
 Magnesium  IV 2 g x 1 on admission Recheck

## 2024-10-05 NOTE — Assessment & Plan Note (Addendum)
 Pt presented with a fall in the setting of generalized weakness Urinalysis abnormal on presentation Urine culture positive for Enterococcus fecalis Treated with Ceftriaxone  -> Ampicillin  500mg  BID for 5 days

## 2024-10-05 NOTE — Assessment & Plan Note (Signed)
 Acute bump in serum creatinine, from 1.0 to 2.4 to 4.16 to 5.12 Started IV hydration Holding losartan , oral hypoglycemic agents Bladder scan showed acute urinary retention, >1L urine retained Foley placed with 1200cc immediate urine return Starting tamsulosin Will need dc with foley in place and outpatient f/u with urology Recheck BMP this PM; if improving, family would prefer to take patient home today

## 2024-10-05 NOTE — Progress Notes (Addendum)
 " Progress Note   Patient: Ryan Gilmore FMW:[id number] DOB: 18-Dec-1932 DOA: 10/01/2024     4 DOS: the patient was seen and examined on 10/05/2024   Brief hospital course: 89yo with h/o vascular dementia, T2DM, HTN, 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  CBG elevated to 470, +URI symptoms.  Noted to be positive for COVID-19.  Also with E. Faecalis UTI, started on Ceftriaxone  -> Ampicillin .  +AKI on presentation.   Assessment & Plan UTI (urinary tract infection) Pt presented with a fall in the setting of generalized weakness Urinalysis abnormal on presentation Urine culture positive for Enterococcus fecalis Treated with Ceftriaxone  -> Ampicillin  500mg  BID for 5 days AKI (acute kidney injury) Acute urinary retention Acute bump in serum creatinine, from 1.0 to 2.4 to 4.16 to 5.12 Started IV hydration Holding losartan , oral hypoglycemic agents Bladder scan showed acute urinary retention, >1L urine retained Foley placed with 1200cc immediate urine return Starting tamsulosin Will need dc with foley in place and outpatient f/u with urology Recheck BMP this PM; if improving, family would prefer to take patient home today Severe vascular dementia (HCC) Patient has been sleeping and lethargic, although family reports that he is generally at baseline At baseline oriented to self and person (can identify spouse but not daughter), not oriented to place, time and situation Delirium precautions Patient has a pattern of awakening, eating and taking naps regularly at home COVID-19 virus infection Patient remains on room air, he is not in any respiratory distress His chest x-ray shows no evidence of pneumonia No indication for steroids or antivirals at this time Continue droplet and contact precautions Fall at home, initial encounter Generalized weakness Had a mild fall while putting on pants in the setting of weakness Likely secondary urinary  tract infection and COVID infection PT/OT consulted Recommended for home PT/OT Mixed hyperlipidemia Continue rosuvastatin   Lymphoma (HCC) Patient with Hx of lymphoma[facility] WBC of 25 seems to be around his recent baseline Daughter states his WBC count is chronically elevated Diabetes mellitus type 2, noninsulin dependent (HCC) Last A1c 10.0% 6 months ago, poor control Hold PO hypoglycemic agent while inpatient Essential hypertension BP stable, current BP 114/59 Continue amlodipine  Hold losartan  in the setting of AKI Pressure injury Stage 2 Pressure Injury coccyx and medial buttocks pink dry, POA Also with thick hyperkeratotic skin to R heel, POA Wound care consulted Cleanse buttocks/sacrum/coccyx with soap and water, apply silicone foam to sacrococcygeal area.  Lift foam daily to assess area, change foam q3 days and prn soiling. Cleanse B feet with soap and water, dry and apply Eucerin cream 2 times daily. Free float heels using pillows to offload pressure.      Consultants: PT OT Wound care  Procedures: None  Antibiotics: Amoxicillin  x 1 Ampicillin  12/30-1/3 Ceftriaxone  12/27-30  30 Day Unplanned Readmission Risk Score    Flowsheet Row ED to Hosp-Admission (Current) from 10/01/2024 in [facility]  30 Day Unplanned Readmission Risk Score (%) 18.37 Filed at 10/05/2024 0400    This score is the patient's risk of an unplanned readmission within 30 days of being discharged (0 -100%). The score is based on dignosis, age, lab data, medications, orders, and past utilization.   Low:  0-14.9   Medium: 15-21.9   High: 22-29.9   Extreme: 30 and above           Subjective: Family reports he  is at his usual baseline and they prefer discharge if possible.  Aware of renal dysfunction and acute urinary retention, need for foley for home.  If renal function improves s/p foley, will dc later  today.   Objective: Vitals:   10/05/24 0625 10/05/24 1122  BP: 110/64 (!) 114/59  Pulse: 87 84  Resp: 18 18  Temp: 98.7 F (37.1 C) 97.9 F (36.6 C)  SpO2: 93% 96%    Intake/Output Summary (Last 24 hours) at 10/05/2024 1437 Last data filed at 10/05/2024 1246 Gross per 24 hour  Intake 1229.22 ml  Output 1200 ml  Net 29.22 ml   Filed Weights   10/02/24 0007  Weight: 77.1 kg    Exam:  General:  Appears calm and comfortable and is in NAD Eyes:  normal lids, iris ENT:  grossly normal hearing, lips & tongue, mmm Cardiovascular:  RRR. No LE edema.  Respiratory:   CTA bilaterally with no wheezes/rales/rhonchi.  Normal respiratory effort. Abdomen:  soft, NT, ND Skin:  no rash or induration seen on limited exam Musculoskeletal:   no bony abnormality Psychiatric:  somnolent mood and affect, speech absent today Neurologic:  unable to effectively perform  Data Reviewed: I have reviewed the patient's lab results since admission.  Pertinent labs for today include:   Glucose 232 BUN 36/Creatinine 5.12/GFR 10 WBC 20.8, stable TSH 0.948    Family Communication: Wife, daughter were present  Mobility: PT/OT Consulted and are recommending - Home Health Pt12/31/2025 1400    Code Status: Full Code   Disposition: Status is: Inpatient Remains inpatient appropriate because: ongoing monitoring     Time spent: 50 minutes  Unresulted Labs (From admission, onward)     Start     Ordered   10/05/24 1600  Basic metabolic panel  Once,   R        10/05/24 1213   10/04/24 1721  MRSA Next Gen by PCR, Nasal  (MRSA Screening)  Once,   R        10/04/24 1721             Author: Delon Herald, MD 10/05/2024 2:37 PM  For on call review [email or web address]            "

## 2024-10-05 NOTE — Plan of Care (Signed)
" °  Problem: Metabolic: Goal: Ability to maintain appropriate glucose levels will improve Outcome: Progressing   Problem: Nutritional: Goal: Maintenance of adequate nutrition will improve Outcome: Progressing   Problem: Skin Integrity: Goal: Risk for impaired skin integrity will decrease Outcome: Progressing   Problem: Tissue Perfusion: Goal: Adequacy of tissue perfusion will improve Outcome: Progressing   Problem: Clinical Measurements: Goal: Ability to maintain clinical measurements within normal limits will improve Outcome: Progressing Goal: Will remain free from infection Outcome: Progressing Goal: Diagnostic test results will improve Outcome: Progressing Goal: Respiratory complications will improve Outcome: Progressing Goal: Cardiovascular complication will be avoided Outcome: Progressing   Problem: Nutrition: Goal: Adequate nutrition will be maintained Outcome: Progressing   Problem: Coping: Goal: Level of anxiety will decrease Outcome: Progressing   Problem: Elimination: Goal: Will not experience complications related to bowel motility Outcome: Progressing Goal: Will not experience complications related to urinary retention Outcome: Progressing   Problem: Pain Managment: Goal: General experience of comfort will improve and/or be controlled Outcome: Progressing   Problem: Safety: Goal: Ability to remain free from injury will improve Outcome: Progressing   Problem: Skin Integrity: Goal: Risk for impaired skin integrity will decrease Outcome: Progressing   Problem: Education: Goal: Knowledge of risk factors and measures for prevention of condition will improve Outcome: Progressing   Problem: Respiratory: Goal: Will maintain a patent airway Outcome: Progressing   Problem: Urinary Elimination: Goal: Signs and symptoms of infection will decrease Outcome: Progressing   "

## 2024-10-05 NOTE — Assessment & Plan Note (Addendum)
 BP stable, current BP 114/59 Continue amlodipine  Hold losartan  in the setting of AKI

## 2024-10-05 NOTE — Assessment & Plan Note (Addendum)
 Patient has been sleeping and lethargic, although family reports that he is generally at baseline At baseline oriented to self and person (can identify spouse but not daughter), not oriented to place, time and situation Delirium precautions Patient has a pattern of awakening, eating and taking naps regularly at home

## 2024-10-05 NOTE — Assessment & Plan Note (Addendum)
 Last A1c 10.0% 6 months ago, poor control Hold PO hypoglycemic agent while inpatient

## 2024-10-05 NOTE — Plan of Care (Signed)
  Problem: Education: Goal: Ability to describe self-care measures that may prevent or decrease complications (Diabetes Survival Skills Education) will improve Outcome: Progressing   Problem: Coping: Goal: Ability to adjust to condition or change in health will improve Outcome: Progressing   Problem: Health Behavior/Discharge Planning: Goal: Ability to manage health-related needs will improve Outcome: Progressing   

## 2024-10-06 DIAGNOSIS — N3000 Acute cystitis without hematuria: Secondary | ICD-10-CM | POA: Diagnosis not present

## 2024-10-06 LAB — GLUCOSE, CAPILLARY
Glucose-Capillary: 221 mg/dL — ABNORMAL HIGH (ref 70–99)
Glucose-Capillary: 303 mg/dL — ABNORMAL HIGH (ref 70–99)

## 2024-10-06 LAB — CBC
HCT: 34.6 % — ABNORMAL LOW (ref 39.0–52.0)
Hemoglobin: 11.6 g/dL — ABNORMAL LOW (ref 13.0–17.0)
MCH: 29.7 pg (ref 26.0–34.0)
MCHC: 33.5 g/dL (ref 30.0–36.0)
MCV: 88.7 fL (ref 80.0–100.0)
Platelets: 160 K/uL (ref 150–400)
RBC: 3.9 MIL/uL — ABNORMAL LOW (ref 4.22–5.81)
RDW: 14.1 % (ref 11.5–15.5)
WBC: 31.1 K/uL — ABNORMAL HIGH (ref 4.0–10.5)
nRBC: 0 % (ref 0.0–0.2)

## 2024-10-06 LAB — BASIC METABOLIC PANEL WITH GFR
Anion gap: 9 (ref 5–15)
BUN: 32 mg/dL — ABNORMAL HIGH (ref 8–23)
CO2: 23 mmol/L (ref 22–32)
Calcium: 7.9 mg/dL — ABNORMAL LOW (ref 8.9–10.3)
Chloride: 107 mmol/L (ref 98–111)
Creatinine, Ser: 2.32 mg/dL — ABNORMAL HIGH (ref 0.61–1.24)
GFR, Estimated: 26 mL/min — ABNORMAL LOW
Glucose, Bld: 210 mg/dL — ABNORMAL HIGH (ref 70–99)
Potassium: 3.9 mmol/L (ref 3.5–5.1)
Sodium: 138 mmol/L (ref 135–145)

## 2024-10-06 MED ORDER — AMOXICILLIN 250 MG PO CAPS: 250.0000 mg | ORAL_CAPSULE | Freq: Two times a day (BID) | ORAL | 0 refills | Status: AC

## 2024-10-06 MED ORDER — ZINC OXIDE 40 % EX OINT: TOPICAL_OINTMENT | Freq: Two times a day (BID) | CUTANEOUS | 0 refills | Status: AC

## 2024-10-06 MED ORDER — INSULIN GLARGINE 100 UNIT/ML ~~LOC~~ SOLN
10.0000 [IU] | Freq: Every day | SUBCUTANEOUS | Status: DC
  Administered 2024-10-06: 10 [IU] via SUBCUTANEOUS
  Filled 2024-10-06: qty 0.1

## 2024-10-06 MED ORDER — ACETAMINOPHEN 325 MG PO TABS: 650.0000 mg | ORAL_TABLET | Freq: Four times a day (QID) | ORAL | Status: AC | PRN

## 2024-10-06 MED ORDER — AMLODIPINE BESYLATE 10 MG PO TABS: 5.0000 mg | ORAL_TABLET | Freq: Every day | ORAL | 0 refills | Status: AC

## 2024-10-06 MED ORDER — HYDROCERIN EX CREA: 1.0000 | TOPICAL_CREAM | Freq: Two times a day (BID) | CUTANEOUS | 0 refills | Status: AC

## 2024-10-06 MED ORDER — TAMSULOSIN HCL 0.4 MG PO CAPS: 0.4000 mg | ORAL_CAPSULE | Freq: Every day | ORAL | 0 refills | Status: AC

## 2024-10-06 NOTE — Plan of Care (Signed)
" °  Problem: Education: Goal: Ability to describe self-care measures that may prevent or decrease complications (Diabetes Survival Skills Education) will improve Outcome: Progressing Goal: Individualized Educational Video(s) Outcome: Progressing   Problem: Coping: Goal: Ability to adjust to condition or change in health will improve Outcome: Progressing   Problem: Fluid Volume: Goal: Ability to maintain a balanced intake and output will improve Outcome: Progressing   Problem: Health Behavior/Discharge Planning: Goal: Ability to identify and utilize available resources and services will improve Outcome: Progressing Goal: Ability to manage health-related needs will improve Outcome: Progressing   Problem: Metabolic: Goal: Ability to maintain appropriate glucose levels will improve Outcome: Progressing   Problem: Nutritional: Goal: Maintenance of adequate nutrition will improve Outcome: Progressing Goal: Progress toward achieving an optimal weight will improve Outcome: Progressing   Problem: Skin Integrity: Goal: Risk for impaired skin integrity will decrease Outcome: Progressing   Problem: Tissue Perfusion: Goal: Adequacy of tissue perfusion will improve Outcome: Progressing   Problem: Education: Goal: Knowledge of General Education information will improve Description: Including pain rating scale, medication(s)/side effects and non-pharmacologic comfort measures Outcome: Progressing   Problem: Health Behavior/Discharge Planning: Goal: Ability to manage health-related needs will improve Outcome: Progressing   Problem: Clinical Measurements: Goal: Ability to maintain clinical measurements within normal limits will improve Outcome: Progressing Goal: Will remain free from infection Outcome: Progressing Goal: Diagnostic test results will improve Outcome: Progressing Goal: Respiratory complications will improve Outcome: Progressing Goal: Cardiovascular complication will  be avoided Outcome: Progressing   Problem: Activity: Goal: Risk for activity intolerance will decrease Outcome: Progressing   Problem: Nutrition: Goal: Adequate nutrition will be maintained Outcome: Progressing   Problem: Coping: Goal: Level of anxiety will decrease Outcome: Progressing   Problem: Elimination: Goal: Will not experience complications related to bowel motility Outcome: Progressing Goal: Will not experience complications related to urinary retention Outcome: Progressing   Problem: Pain Managment: Goal: General experience of comfort will improve and/or be controlled Outcome: Progressing   Problem: Safety: Goal: Ability to remain free from injury will improve Outcome: Progressing   Problem: Skin Integrity: Goal: Risk for impaired skin integrity will decrease Outcome: Progressing   Problem: Education: Goal: Knowledge of risk factors and measures for prevention of condition will improve Outcome: Progressing   Problem: Coping: Goal: Psychosocial and spiritual needs will be supported Outcome: Progressing   Problem: Respiratory: Goal: Will maintain a patent airway Outcome: Progressing Goal: Complications related to the disease process, condition or treatment will be avoided or minimized Outcome: Progressing   Problem: Urinary Elimination: Goal: Signs and symptoms of infection will decrease Outcome: Progressing   "

## 2024-10-06 NOTE — Discharge Summary (Addendum)
 " Physician Discharge Summary   Patient: Ryan Gilmore MRN: [id number] DOB: 06/20/1933  Admit date:     10/01/2024  Discharge date: 10/06/2024  Discharge Physician: Delon Herald   PCP: Duanne Butler DASEN, MD   Recommendations at discharge:   You are being discharged with home health physical and occupational therapy Keep foley in place until voiding trial with urology (referral placed) Take Flomax (tamsulosin) 0.4 mg daily after supper Complete antibiotics (Amoxicillin  250 mg twice daily for 3 more days) Cleanse buttocks/sacrum/coccyx with soap and water, apply silicone foam to sacrococcygeal area.  Lift foam daily to assess area, change foam every 3 days and as needed for soiling. Cleanse both feet with soap and water, dry and apply Eucerin cream 2 times daily. Free float heels using pillows to offload pressure. Hold losartan  and take amlodipine  instead for blood pressure Follow up in 1-2 weeks with Dr. Duanne Consider addition of glargine for diabetes as an outpatient; also consider stopping glipizide  given hypoglycemia risks in the elderly  Discharge Diagnoses: Principal Problem:   UTI (urinary tract infection) Active Problems:   Diabetes mellitus type 2, noninsulin dependent (HCC)   Mixed hyperlipidemia   Essential hypertension   AKI (acute kidney injury)   Hypomagnesemia   Lymphoma (HCC)   Fall at home, initial encounter   Generalized weakness   COVID-19 virus infection   Severe vascular dementia (HCC)   Pressure injury   Acute urinary retention   Hospital Course: 89yo with h/o vascular dementia, T2DM, HTN, 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  CBG elevated to 470, +URI symptoms.  Noted to be positive for COVID-19.  Also with E. Faecalis UTI, started on Ceftriaxone  -> Ampicillin .  +AKI on presentation.  Assessment and Plan:  Assessment & Plan UTI (urinary tract infection) Pt presented with a fall in the setting of  generalized weakness Urinalysis abnormal on presentation Urine culture positive for Enterococcus fecalis Treated with Ceftriaxone  -> Ampicillin  500mg  BID for 5 days AKI (acute kidney injury) Acute urinary retention Acute bump in serum creatinine, from 1.0 to 2.4 to 4.16 to 5.12 Started IV hydration Holding losartan , oral hypoglycemic agents Bladder scan showed acute urinary retention, >1L urine retained Foley placed with 1200cc immediate urine return Starting tamsulosin Will need dc with foley in place and outpatient f/u with urology Marked improvement in creatinine today, appropriate for dc with foley Severe vascular dementia (HCC) Patient has been sleeping and lethargic, although family reports that he is generally at baseline At baseline oriented to self and person (can identify spouse but not daughter), not oriented to place, time and situation Delirium precautions Patient has a pattern of awakening, eating and taking naps regularly at home COVID-19 virus infection Patient remains on room air, he is not in any respiratory distress His chest x-ray shows no evidence of pneumonia No indication for steroids or antivirals at this time Continue droplet and contact precautions Fall at home, initial encounter Generalized weakness Had a mild fall while putting on pants in the setting of weakness Likely secondary urinary tract infection and COVID infection PT/OT consulted Recommended for home PT/OT Mixed hyperlipidemia Continue rosuvastatin   Lymphoma (HCC) Patient with Hx of lymphoma[facility] WBC of 25 seems to be around his recent baseline Daughter states his WBC count is chronically elevated Diabetes mellitus type 2, noninsulin dependent (HCC) Last A1c 10.0% 6 months ago, poor control Resume PO hypoglycemic agents (glipizide , Jardiance, metformin ,  pioglitazone )  Consider outpatient addition of insulin  glargine (and stopping glipizide  due to risk of  hypoglycemia in the elderly) Essential hypertension Started on amlodipine  Hold losartan  in the setting of AKI Pressure injury Stage 2 Pressure Injury coccyx and medial buttocks pink dry, POA Also with thick hyperkeratotic skin to R heel, POA Wound care consulted Cleanse buttocks/sacrum/coccyx with soap and water, apply silicone foam to sacrococcygeal area.  Lift foam daily to assess area, change foam q3 days and prn soiling. Cleanse B feet with soap and water, dry and apply Eucerin cream 2 times daily. Free float heels using pillows to offload pressure.      Consultants: PT OT Wound care DM coordinator   Procedures: None   Antibiotics: Amoxicillin  x 1 Ampicillin  12/30-1/3 Ceftriaxone  12/27-30   Pain control - [place]  Controlled Substance Reporting System database was reviewed. and patient was instructed, not to drive, operate heavy machinery, perform activities at heights, swimming or participation in water activities or provide baby-sitting services while on Pain, Sleep and Anxiety Medications; until their outpatient Physician has advised to do so again. Also recommended to not to take more than prescribed Pain, Sleep and Anxiety Medications.   Disposition: Home health Diet recommendation:  Carb modified diet DISCHARGE MEDICATION: Allergies as of 10/06/2024   No Known Allergies      Medication List     STOP taking these medications    cephALEXin  500 MG capsule Commonly known as: KEFLEX    galantamine 12 MG tablet Commonly known as: RAZADYNE   losartan  25 MG tablet Commonly known as: COZAAR    NAMENDA PO       TAKE these medications    acetaminophen  325 MG tablet Commonly known as: TYLENOL  Take 2 tablets (650 mg total) by mouth every 6 (six) hours as needed for mild pain (pain score 1-3) or fever (or Fever >/= 101).   amLODipine  10 MG tablet Commonly known as: NORVASC  Take 0.5 tablets (5 mg total) by mouth daily. Start taking on: October 07, 2024   amoxicillin  250 MG capsule Commonly known as: AMOXIL  Take 1 capsule (250 mg total) by mouth every 12 (twelve) hours for 3 days.   aspirin  EC 81 MG tablet Take 81 mg by mouth daily. Swallow whole.   Blood Glucose System Pak Kit Please dispense based on patient and insurance preference. Use as directed to monitor FSBS 2x. Dx: E11.E11.65.   BLOOD GLUCOSE TEST STRIPS Strp Please dispense one touch verio flex lancets, and testing strips. Use as directed to monitor FSBS 2x. Dx: E11.E11.65.   Crestor  40 MG tablet Generic drug: rosuvastatin  Take 40 mg by mouth daily.   empagliflozin 25 MG Tabs tablet Commonly known as: JARDIANCE Take 25 mg by mouth daily.   glipiZIDE  5 MG tablet Commonly known as: GLUCOTROL  Take by mouth 2 (two) times daily before a meal.   hydrocerin Crea Apply 1 Application topically 2 (two) times daily. Apply to feet/heels twice daily; do not place in between toes   Lancets Misc Please dispense based on patient and insurance preference. Use as directed to monitor FSBS 2x. Dx: E11.E11.65.   liver oil-zinc  oxide 40 % ointment Commonly known as: DESITIN Apply topically 2 (two) times daily. Apply to buttocks twice daily and as needed for soiling   metFORMIN  500 MG tablet Commonly known as: GLUCOPHAGE  TAKE 2 TABLETS BY MOUTH TWICE DAILY WITH A MEAL   multivitamin tablet Take 1 tablet by mouth daily.   pioglitazone  30 MG tablet Commonly known as: Actos  Take  1 tablet (30 mg total) by mouth daily.   tamsulosin 0.4 MG Caps capsule Commonly known as: FLOMAX Take 1 capsule (0.4 mg total) by mouth daily after supper.               Discharge Care Instructions  (From admission, onward)           Start     Ordered   10/06/24 0000  Discharge wound care:       Comments: Cleanse both feet with soap and water, dry and apply Eucerin cream 2 times daily.  Free float heels using pillows to offload pressure.   Cleanse buttocks/sacrum/coccyx with soap  and water, apply silicone foam to sacrococcygeal area.  Lift foam daily to assess area, change foam every 3 days and as needed for soiling   10/06/24 1002            Contact information for follow-up providers     [facility]. Schedule an appointment as soon as possible for a visit.   Contact information: [address] [place]  [address] [phone]        Duanne Butler DASEN, MD Follow up in 1 week(s).   Specialty: Family Medicine Contact information: [address] [place] [address] [phone]              Contact information for after-discharge care     Home Medical Care     [facility]) .   Service: Home Health Services Contact information: [address] [place]   [address] [phone]                    Discharge Exam:   Subjective: Feeling ok.  Oriented to person only.  Family is present and delighted to take him home today.   Objective: Vitals:   10/05/24 2013 10/06/24 0520  BP: (!) 108/54 (!) 102/52  Pulse: 91 80  Resp: 20 18  Temp: 98.2 F (36.8 C) 99 F (37.2 C)  SpO2: 96% 90%    Intake/Output Summary (Last 24 hours) at 10/06/2024 1314 Last data filed at 10/06/2024 0900 Gross per 24 hour  Intake 360 ml  Output 1900 ml  Net -1540 ml   Filed Weights   10/02/24 0007  Weight: 77.1 kg    Exam:  General:  Appears calm and comfortable and is in NAD Eyes:  normal lids, iris ENT:  grossly normal hearing, lips & tongue, mmm Cardiovascular:  RRR. No LE edema.  Respiratory:   CTA bilaterally with no wheezes/rales/rhonchi.  Normal respiratory effort. Abdomen:  soft, NT, ND Skin:  no rash or induration seen on limited exam Musculoskeletal:   no bony abnormality Psychiatric:  blunted mood and affect, speech sparse but appropriate, AOx1 Neurologic:  CN 2-12 grossly intact, moves all extremities in coordinated  fashion  Data Reviewed: I have reviewed the patient's lab results since admission.  Pertinent labs for today include:   Glucose 210 BUN 32/Creatinine 2.32/GFR 26, significantly improved WBC 31.1 Hgb 11.6    Condition at discharge: improving  The results of significant diagnostics from this hospitalization (including imaging, microbiology, ancillary and laboratory) are listed below for reference.   Imaging Studies: DG CHEST PORT 1 VIEW Result Date: 10/04/2024 CLINICAL DATA:  Cough. EXAM: PORTABLE CHEST 1 VIEW COMPARISON:  Chest radiograph dated 10/01/2024. FINDINGS: Mild cardiomegaly with mild vascular congestion. No focal consolidation, pleural effusion, pneumothorax. The cardiac silhouette is within  normal limits. No acute osseous pathology. IMPRESSION: Mild cardiomegaly with mild vascular congestion. No focal consolidation. Electronically Signed   By: Vanetta Chou M.D.   On: 10/04/2024 18:51   DG Chest Portable 1 View Result Date: 10/01/2024 EXAM: 1 VIEW(S) XRAY OF THE CHEST 10/01/2024 04:41:00 PM COMPARISON: 03/09/2024 CLINICAL HISTORY: fall, ?AMS FINDINGS: LUNGS AND PLEURA: Low lung volumes. Likely calcified granuloma overlying right apex. No pleural effusion. No pneumothorax. HEART AND MEDIASTINUM: No acute abnormality of the cardiac and mediastinal silhouettes. BONES AND SOFT TISSUES: No acute osseous abnormality. IMPRESSION: 1. No acute findings. Electronically signed by: Greig Pique MD 10/01/2024 06:01 PM EST RP Workstation: [id number]    Microbiology: Results for orders placed or performed during the hospital encounter of 10/01/24  Urine Culture     Status: Abnormal   Collection Time: 10/01/24 11:03 PM   Specimen: Urine, Clean Catch  Result Value Ref Range Status   Specimen Description   Final    URINE, CLEAN CATCH Performed at [facility], [address], [place], [place] [address]    Special Requests   Final    NONE Performed at [facility], [address], [place], [place] [address]    Culture >=100,000 COLONIES/mL ENTEROCOCCUS FAECALIS (A)  Final   Report Status 10/04/2024 FINAL  Final   Organism ID, Bacteria ENTEROCOCCUS FAECALIS (A)  Final      Susceptibility   Enterococcus faecalis - MIC*    AMPICILLIN  <=2 SENSITIVE Sensitive     NITROFURANTOIN <=16 SENSITIVE Sensitive     VANCOMYCIN 2 SENSITIVE Sensitive     * >=100,000 COLONIES/mL ENTEROCOCCUS FAECALIS  Resp panel by RT-PCR (RSV, Flu A&B, Covid) Anterior Nasal Swab     Status: Abnormal   Collection Time: 10/01/24 11:03 PM   Specimen: Anterior Nasal Swab  Result Value Ref Range Status   SARS Coronavirus 2 by RT PCR POSITIVE (A) NEGATIVE Final    Comment: (NOTE) SARS-CoV-2 target nucleic acids are DETECTED.  The SARS-CoV-2 RNA is generally detectable in upper respiratory specimens during the acute phase of infection. Positive results are indicative of the presence of the identified virus, but do not rule out bacterial infection or co-infection with other pathogens not detected by the test. Clinical correlation with patient history and other diagnostic information is necessary to determine patient infection status. The expected result is Negative.  Fact Sheet for Patients: bloggercourse.com  Fact Sheet for Healthcare Providers: seriousbroker.it  This test is not yet approved or cleared by the [place]  FDA and  has been authorized for detection and/or diagnosis of SARS-CoV-2 by FDA under an Emergency Use Authorization (EUA).  This EUA will remain in effect (meaning this test can be used) for the duration of  the COVID-19 declaration under Section 564(b)(1) of the A ct, 21 U.S.C. section 360bbb-3(b)(1), unless the authorization is terminated or revoked sooner.     Influenza A by PCR NEGATIVE NEGATIVE Final   Influenza B by PCR NEGATIVE NEGATIVE Final    Comment: (NOTE) The Xpert  Xpress SARS-CoV-2/FLU/RSV plus assay is intended as an aid in the diagnosis of influenza from Nasopharyngeal swab specimens and should not be used as a sole basis for treatment. Nasal washings and aspirates are unacceptable for Xpert Xpress SARS-CoV-2/FLU/RSV testing.  Fact Sheet for Patients: bloggercourse.com  Fact Sheet for Healthcare Providers: seriousbroker.it  This test is not yet approved or cleared by the [place]  FDA and has been authorized for detection and/or diagnosis of SARS-CoV-2 by FDA  under an Emergency Use Authorization (EUA). This EUA will remain in effect (meaning this test can be used) for the duration of the COVID-19 declaration under Section 564(b)(1) of the Act, 21 U.S.C. section 360bbb-3(b)(1), unless the authorization is terminated or revoked.     Resp Syncytial Virus by PCR NEGATIVE NEGATIVE Final    Comment: (NOTE) Fact Sheet for Patients: bloggercourse.com  Fact Sheet for Healthcare Providers: seriousbroker.it  This test is not yet approved or cleared by the [place]  FDA and has been authorized for detection and/or diagnosis of SARS-CoV-2 by FDA under an Emergency Use Authorization (EUA). This EUA will remain in effect (meaning this test can be used) for the duration of the COVID-19 declaration under Section 564(b)(1) of the Act, 21 U.S.C. section 360bbb-3(b)(1), unless the authorization is terminated or revoked.  Performed at [facility], [address] [place], [place] [address]     Labs: CBC: Recent Labs  Lab 10/01/24 1620 10/02/24 0455 10/03/24 0425 10/04/24 0426 10/05/24 0823 10/06/24 0446  WBC 25.2* 32.0* 20.4* 20.7* 20.8* 31.1*  NEUTROABS 7.0  --   --   --   --   --   HGB 13.7 12.6* 13.5 13.8 13.7 11.6*  HCT 41.8 38.6* 40.6 41.4 40.3 34.6*  MCV 91.1 90.4 89.4 88.5 88.6 88.7  PLT 181 164 164 170 164  160   Basic Metabolic Panel: Recent Labs  Lab 10/02/24 0455 10/03/24 0425 10/04/24 0426 10/04/24 2057 10/05/24 0823 10/06/24 0446  NA 138 138 137 137 136 138  K 3.9 3.6 4.2 4.1 4.2 3.9  CL 103 102 99 103 101 107  CO2 27 23 20* 22 20* 23  GLUCOSE 133* 187* 248* 188* 232* 210*  BUN 14 12 24* 33* 36* 32*  CREATININE 0.97 1.05 2.46* 4.16* 5.12* 2.32*  CALCIUM  8.9 9.1 9.2 8.4* 8.3* 7.9*  MG 1.5* 1.8  --   --   --   --   PHOS 3.0  --   --   --   --   --    Liver Function Tests: Recent Labs  Lab 10/01/24 1620 10/03/24 0425 10/04/24 0426 10/04/24 2057  AST 21 24 29 26   ALT 5 10 10 8   ALKPHOS 77 66 71 64  BILITOT 0.4 0.4 0.4 0.3  PROT 7.0 6.3* 6.5 6.0*  ALBUMIN 4.2 3.8 3.8 3.4*   CBG: Recent Labs  Lab 10/05/24 1204 10/05/24 1703 10/05/24 2037 10/06/24 0748 10/06/24 1206  GLUCAP 310* 241* 179* 221* 303*    Discharge time spent: greater than 30 minutes.  Signed: Delon Herald, MD [facility] 10/06/2024 "

## 2024-10-06 NOTE — Assessment & Plan Note (Addendum)
 Acute bump in serum creatinine, from 1.0 to 2.4 to 4.16 to 5.12 Started IV hydration Holding losartan , oral hypoglycemic agents Bladder scan showed acute urinary retention, >1L urine retained Foley placed with 1200cc immediate urine return Starting tamsulosin Will need dc with foley in place and outpatient f/u with urology Marked improvement in creatinine today, appropriate for dc with foley

## 2024-10-06 NOTE — Assessment & Plan Note (Addendum)
 Had a mild fall while putting on pants in the setting of weakness Likely secondary urinary tract infection and COVID infection PT/OT consulted Recommended for home PT/OT

## 2024-10-06 NOTE — Assessment & Plan Note (Addendum)
 Patient with Hx of [facility] WBC of 25 seems to be around his recent baseline Daughter states his WBC count is chronically elevated

## 2024-10-06 NOTE — TOC Transition Note (Signed)
 Transition of Care [facility]) - Discharge Note   Patient Details  Name: Ryan Gilmore MRN: [id number] Date of Birth: 12/26/1932  Transition of [facility]) CM/SW Contact:  Jon ONEIDA Anon, RN Phone Number: 10/06/2024, 10:54 AM   Clinical Narrative:    Pt will discharge home with home health PT/OT services through [facility]. Pt/ pt daughter Cam are in agreement with the discharge plan. HH orders are in place. No DME needs identified. Pt family will provide transportation at discharge. No further ICM needs at this time. ICM will sign off.   Final next level of care: Home w Home Health Services Barriers to Discharge: Barriers Resolved   Patient Goals and CMS Choice Patient states their goals for this hospitalization and ongoing recovery are:: Return home and receive [facility] services CMS Medicare.gov Compare Post Acute Care list provided to:: Patient Represenative (must comment) Choice offered to / list presented to : Adult Children, Spouse [place] ownership interest in [facility].provided to:: Spouse    Discharge Placement                  Name of family member notified: broadnax,cam  Daughter, Emergency Contact  [phone] Patient and family notified of of transfer: 10/06/24  Discharge Plan and Services Additional resources added to the After Visit Summary for   In-house Referral: NA Discharge Planning Services: CM Consult Post Acute Care Choice: NA          DME Arranged: N/A DME Agency: NA       HH Arranged: OT, PT HH Agency: [facility]        Social Drivers of Health (SDOH) Interventions SDOH Screenings   Food Insecurity: No Food Insecurity (10/02/2024)  Housing: Low Risk (10/02/2024)  Transportation Needs: No Transportation Needs (10/02/2024)  Utilities: Not At Risk (10/02/2024)  Alcohol Screen: Low Risk (07/16/2023)  Depression (PHQ2-9): Low Risk (06/02/2024)  Financial Resource Strain: Low Risk (07/16/2023)  Physical Activity:  Inactive (07/16/2023)  Social Connections: Moderately Isolated (10/02/2024)  Stress: No Stress Concern Present (07/16/2023)  Tobacco Use: Medium Risk (10/02/2024)  Health Literacy: Adequate Health Literacy (07/16/2023)     Readmission Risk Interventions    10/03/2024   12:31 PM  Readmission Risk Prevention Plan  Post Dischage Appt Complete  Medication Screening Complete  Transportation Screening Complete

## 2024-10-06 NOTE — Plan of Care (Signed)
 " Problem: Education: Goal: Ability to describe self-care measures that may prevent or decrease complications (Diabetes Survival Skills Education) will improve 10/06/2024 1139 by Hogan-Nutting, Burnard HERO, RN Outcome: Adequate for Discharge 10/06/2024 0902 by Reesa Burnard HERO, RN Outcome: Progressing Goal: Individualized Educational Video(s) 10/06/2024 1139 by Reesa Burnard HERO, RN Outcome: Adequate for Discharge 10/06/2024 0902 by Reesa Burnard HERO, RN Outcome: Progressing   Problem: Coping: Goal: Ability to adjust to condition or change in health will improve 10/06/2024 1139 by Reesa Burnard HERO, RN Outcome: Adequate for Discharge 10/06/2024 0902 by Reesa Burnard HERO, RN Outcome: Progressing   Problem: Fluid Volume: Goal: Ability to maintain a balanced intake and output will improve 10/06/2024 1139 by Reesa Burnard HERO, RN Outcome: Adequate for Discharge 10/06/2024 0902 by Reesa Burnard HERO, RN Outcome: Progressing   Problem: Health Behavior/Discharge Planning: Goal: Ability to identify and utilize available resources and services will improve 10/06/2024 1139 by Hogan-Nutting, Burnard HERO, RN Outcome: Adequate for Discharge 10/06/2024 0902 by Reesa Burnard HERO, RN Outcome: Progressing Goal: Ability to manage health-related needs will improve 10/06/2024 1139 by Hogan-Nutting, Burnard HERO, RN Outcome: Adequate for Discharge 10/06/2024 0902 by Reesa Burnard HERO, RN Outcome: Progressing   Problem: Metabolic: Goal: Ability to maintain appropriate glucose levels will improve 10/06/2024 1139 by Reesa Burnard HERO, RN Outcome: Adequate for Discharge 10/06/2024 0902 by Reesa Burnard HERO, RN Outcome: Progressing   Problem: Nutritional: Goal: Maintenance of adequate nutrition will improve 10/06/2024 1139 by Reesa Burnard HERO, RN Outcome: Adequate for Discharge 10/06/2024 0902 by Reesa Burnard HERO, RN Outcome: Progressing Goal: Progress toward achieving  an optimal weight will improve 10/06/2024 1139 by Hogan-Nutting, Burnard HERO, RN Outcome: Adequate for Discharge 10/06/2024 0902 by Reesa Burnard HERO, RN Outcome: Progressing   Problem: Skin Integrity: Goal: Risk for impaired skin integrity will decrease 10/06/2024 1139 by Reesa Burnard HERO, RN Outcome: Adequate for Discharge 10/06/2024 0902 by Reesa Burnard HERO, RN Outcome: Progressing   Problem: Tissue Perfusion: Goal: Adequacy of tissue perfusion will improve 10/06/2024 1139 by Reesa Burnard HERO, RN Outcome: Adequate for Discharge 10/06/2024 0902 by Reesa Burnard HERO, RN Outcome: Progressing   Problem: Education: Goal: Knowledge of General Education information will improve Description: Including pain rating scale, medication(s)/side effects and non-pharmacologic comfort measures 10/06/2024 1139 by Reesa Burnard HERO, RN Outcome: Adequate for Discharge 10/06/2024 0902 by Reesa Burnard HERO, RN Outcome: Progressing   Problem: Health Behavior/Discharge Planning: Goal: Ability to manage health-related needs will improve 10/06/2024 1139 by Reesa Burnard HERO, RN Outcome: Adequate for Discharge 10/06/2024 0902 by Reesa Burnard HERO, RN Outcome: Progressing   Problem: Clinical Measurements: Goal: Ability to maintain clinical measurements within normal limits will improve 10/06/2024 1139 by Hogan-Nutting, Burnard HERO, RN Outcome: Adequate for Discharge 10/06/2024 0902 by Reesa Burnard HERO, RN Outcome: Progressing Goal: Will remain free from infection 10/06/2024 1139 by Reesa Burnard HERO, RN Outcome: Adequate for Discharge 10/06/2024 0902 by Reesa Burnard HERO, RN Outcome: Progressing Goal: Diagnostic test results will improve 10/06/2024 1139 by Reesa Burnard HERO, RN Outcome: Adequate for Discharge 10/06/2024 0902 by Reesa Burnard HERO, RN Outcome: Progressing Goal: Respiratory complications will improve 10/06/2024 1139 by Reesa Burnard HERO,  RN Outcome: Adequate for Discharge 10/06/2024 0902 by Reesa Burnard HERO, RN Outcome: Progressing Goal: Cardiovascular complication will be avoided 10/06/2024 1139 by Reesa Burnard HERO, RN Outcome: Adequate for Discharge 10/06/2024 0902 by Reesa Burnard HERO, RN Outcome: Progressing   Problem: Activity: Goal: Risk for activity intolerance will decrease 10/06/2024 1139 by Hogan-Nutting, Burnard HERO, RN Outcome: Adequate for  Discharge 10/06/2024 0902 by Reesa Burnard HERO, RN Outcome: Progressing   Problem: Nutrition: Goal: Adequate nutrition will be maintained 10/06/2024 1139 by Reesa Burnard HERO, RN Outcome: Adequate for Discharge 10/06/2024 0902 by Reesa Burnard HERO, RN Outcome: Progressing   Problem: Coping: Goal: Level of anxiety will decrease 10/06/2024 1139 by Reesa Burnard HERO, RN Outcome: Adequate for Discharge 10/06/2024 0902 by Reesa Burnard HERO, RN Outcome: Progressing   Problem: Elimination: Goal: Will not experience complications related to bowel motility 10/06/2024 1139 by Reesa Burnard HERO, RN Outcome: Adequate for Discharge 10/06/2024 0902 by Reesa Burnard HERO, RN Outcome: Progressing Goal: Will not experience complications related to urinary retention 10/06/2024 1139 by Reesa Burnard HERO, RN Outcome: Adequate for Discharge 10/06/2024 0902 by Reesa Burnard HERO, RN Outcome: Progressing   Problem: Pain Managment: Goal: General experience of comfort will improve and/or be controlled 10/06/2024 1139 by Reesa Burnard HERO, RN Outcome: Adequate for Discharge 10/06/2024 0902 by Reesa Burnard HERO, RN Outcome: Progressing   Problem: Safety: Goal: Ability to remain free from injury will improve 10/06/2024 1139 by Hogan-Nutting, Burnard HERO, RN Outcome: Adequate for Discharge 10/06/2024 0902 by Reesa Burnard HERO, RN Outcome: Progressing   Problem: Skin Integrity: Goal: Risk for impaired skin integrity will decrease 10/06/2024  1139 by Reesa Burnard HERO, RN Outcome: Adequate for Discharge 10/06/2024 0902 by Reesa Burnard HERO, RN Outcome: Progressing   Problem: Education: Goal: Knowledge of risk factors and measures for prevention of condition will improve 10/06/2024 1139 by Hogan-Nutting, Burnard HERO, RN Outcome: Adequate for Discharge 10/06/2024 0902 by Reesa Burnard HERO, RN Outcome: Progressing   Problem: Coping: Goal: Psychosocial and spiritual needs will be supported 10/06/2024 1139 by Reesa Burnard HERO, RN Outcome: Adequate for Discharge 10/06/2024 0902 by Reesa Burnard HERO, RN Outcome: Progressing   Problem: Respiratory: Goal: Will maintain a patent airway 10/06/2024 1139 by Reesa Burnard HERO, RN Outcome: Adequate for Discharge 10/06/2024 0902 by Reesa Burnard HERO, RN Outcome: Progressing Goal: Complications related to the disease process, condition or treatment will be avoided or minimized 10/06/2024 1139 by Reesa Burnard HERO, RN Outcome: Adequate for Discharge 10/06/2024 0902 by Reesa Burnard HERO, RN Outcome: Progressing   Problem: Urinary Elimination: Goal: Signs and symptoms of infection will decrease 10/06/2024 1139 by Reesa Burnard HERO, RN Outcome: Adequate for Discharge 10/06/2024 0902 by Reesa Burnard HERO, RN Outcome: Progressing   "

## 2024-10-06 NOTE — Assessment & Plan Note (Addendum)
 Last A1c 10.0% 6 months ago, poor control Resume PO hypoglycemic agents (glipizide , Jardiance, metformin , pioglitazone )  Consider outpatient addition of insulin  glargine (and stopping glipizide  due to risk of hypoglycemia in the elderly)

## 2024-10-06 NOTE — Assessment & Plan Note (Addendum)
 Stage 2 Pressure Injury coccyx and medial buttocks pink dry, POA Also with thick hyperkeratotic skin to R heel, POA Wound care consulted Cleanse buttocks/sacrum/coccyx with soap and water, apply silicone foam to sacrococcygeal area.  Lift foam daily to assess area, change foam q3 days and prn soiling. Cleanse B feet with soap and water, dry and apply Eucerin cream 2 times daily. Free float heels using pillows to offload pressure.

## 2024-10-06 NOTE — Assessment & Plan Note (Addendum)
 Patient has been sleeping and lethargic, although family reports that he is generally at baseline At baseline oriented to self and person (can identify spouse but not daughter), not oriented to place, time and situation Delirium precautions Patient has a pattern of awakening, eating and taking naps regularly at home

## 2024-10-06 NOTE — Assessment & Plan Note (Addendum)
 Started on amlodipine  Hold losartan  in the setting of AKI

## 2024-10-06 NOTE — Assessment & Plan Note (Addendum)
 Pt presented with a fall in the setting of generalized weakness Urinalysis abnormal on presentation Urine culture positive for Enterococcus fecalis Treated with Ceftriaxone  -> Ampicillin  500mg  BID for 5 days

## 2024-10-06 NOTE — Assessment & Plan Note (Addendum)
"   Continue rosuvastatin          "

## 2024-10-06 NOTE — Assessment & Plan Note (Addendum)
 Patient remains on room air, he is not in any respiratory distress His chest x-ray shows no evidence of pneumonia No indication for steroids or antivirals at this time Continue droplet and contact precautions

## 2024-10-07 ENCOUNTER — Telehealth: Payer: Self-pay | Admitting: *Deleted

## 2024-10-07 NOTE — Transitions of Care (Post Inpatient/ED Visit) (Signed)
" ° °  10/07/2024  Name: Ryan Gilmore MRN: [id number] DOB: 11-15-1932  Today's TOC FU Call Status: Today's TOC FU Call Status:: Unsuccessful Call (1st Attempt) Unsuccessful Call (1st Attempt) Date: 10/07/24  Attempted to reach the patient regarding the most recent Inpatient/ED visit.  Follow Up Plan: Additional outreach attempts will be made to reach the patient to complete the Transitions of Care (Post Inpatient/ED visit) call.   Mliss Creed [facility], BSN RN Care Manager/ Transition of Care [place]/ [facility] [phone]  "

## 2024-10-09 ENCOUNTER — Emergency Department (HOSPITAL_COMMUNITY)
Admission: EM | Admit: 2024-10-09 | Discharge: 2024-10-09 | Disposition: A | Discharge status: Home / Self Care | Attending: Emergency Medicine | Admitting: Emergency Medicine

## 2024-10-09 ENCOUNTER — Other Ambulatory Visit: Payer: Self-pay

## 2024-10-09 ENCOUNTER — Encounter (HOSPITAL_COMMUNITY): Payer: Self-pay

## 2024-10-09 DIAGNOSIS — K5900 Constipation, unspecified: Secondary | ICD-10-CM | POA: Insufficient documentation

## 2024-10-09 DIAGNOSIS — E1165 Type 2 diabetes mellitus with hyperglycemia: Secondary | ICD-10-CM | POA: Diagnosis not present

## 2024-10-09 DIAGNOSIS — R339 Retention of urine, unspecified: Secondary | ICD-10-CM | POA: Insufficient documentation

## 2024-10-09 DIAGNOSIS — Z7982 Long term (current) use of aspirin: Secondary | ICD-10-CM | POA: Insufficient documentation

## 2024-10-09 DIAGNOSIS — F0390 Unspecified dementia without behavioral disturbance: Secondary | ICD-10-CM | POA: Diagnosis not present

## 2024-10-09 DIAGNOSIS — R739 Hyperglycemia, unspecified: Secondary | ICD-10-CM | POA: Diagnosis present

## 2024-10-09 DIAGNOSIS — Z7984 Long term (current) use of oral hypoglycemic drugs: Secondary | ICD-10-CM | POA: Insufficient documentation

## 2024-10-09 DIAGNOSIS — R310 Gross hematuria: Secondary | ICD-10-CM | POA: Diagnosis not present

## 2024-10-09 LAB — URINALYSIS, ROUTINE W REFLEX MICROSCOPIC
Bilirubin Urine: NEGATIVE
Glucose, UA: >=500 mg/dL — AB
Ketones, ur: NEGATIVE mg/dL
Leukocytes,Ua: NEGATIVE
Nitrite: NEGATIVE
Protein, ur: NEGATIVE mg/dL
RBC / HPF: >50 RBC/hpf (ref 0–5)
Specific Gravity, Urine: 1.002 — ABNORMAL LOW (ref 1.005–1.030)
pH: 5 (ref 5.0–8.0)

## 2024-10-09 LAB — BASIC METABOLIC PANEL WITH GFR
Anion gap: 14 (ref 5–15)
BUN: 24 mg/dL — ABNORMAL HIGH (ref 8–23)
CO2: 19 mmol/L — ABNORMAL LOW (ref 22–32)
Calcium: 8.8 mg/dL — ABNORMAL LOW (ref 8.9–10.3)
Chloride: 103 mmol/L (ref 98–111)
Creatinine, Ser: 2.4 mg/dL — ABNORMAL HIGH (ref 0.61–1.24)
GFR, Estimated: 25 mL/min — ABNORMAL LOW
Glucose, Bld: 482 mg/dL — ABNORMAL HIGH (ref 70–99)
Potassium: 5.1 mmol/L (ref 3.5–5.1)
Sodium: 136 mmol/L (ref 135–145)

## 2024-10-09 LAB — CBC
HCT: 41.2 % (ref 39.0–52.0)
Hemoglobin: 12.8 g/dL — ABNORMAL LOW (ref 13.0–17.0)
MCH: 29.5 pg (ref 26.0–34.0)
MCHC: 31.1 g/dL (ref 30.0–36.0)
MCV: 94.9 fL (ref 80.0–100.0)
Platelets: 217 K/uL (ref 150–400)
RBC: 4.34 MIL/uL (ref 4.22–5.81)
RDW: 14 % (ref 11.5–15.5)
WBC: 23 K/uL — ABNORMAL HIGH (ref 4.0–10.5)
nRBC: 0 % (ref 0.0–0.2)

## 2024-10-09 LAB — CBG MONITORING, ED
Glucose-Capillary: 419 mg/dL — ABNORMAL HIGH (ref 70–99)
Glucose-Capillary: 351 mg/dL — ABNORMAL HIGH (ref 70–99)

## 2024-10-09 MED ORDER — SODIUM CHLORIDE 0.9 % IR SOLN
3000.0000 mL | Status: DC
  Administered 2024-10-09: 3000 mL

## 2024-10-09 MED ORDER — INSULIN ASPART 100 UNIT/ML IJ SOLN
5.0000 [IU] | Freq: Once | INTRAMUSCULAR | Status: AC
  Administered 2024-10-09: 5 [IU] via INTRAVENOUS
  Filled 2024-10-09: qty 5

## 2024-10-09 MED ORDER — LACTATED RINGERS IV BOLUS
1000.0000 mL | Freq: Once | INTRAVENOUS | Status: AC
  Administered 2024-10-09: 1000 mL via INTRAVENOUS

## 2024-10-09 MED ORDER — METAMUCIL SMOOTH TEXTURE 58.6 % PO POWD: 1.0000 | Freq: Three times a day (TID) | ORAL | 12 refills | Status: AC

## 2024-10-09 MED ORDER — POLYETHYLENE GLYCOL 3350 17 G PO PACK: 17.0000 g | PACK | Freq: Every day | ORAL | 0 refills | Status: AC

## 2024-10-09 MED ORDER — SENNOSIDES-DOCUSATE SODIUM 8.6-50 MG PO TABS: 1.0000 | ORAL_TABLET | Freq: Every day | ORAL | 0 refills | Status: AC

## 2024-10-09 NOTE — Discharge Instructions (Addendum)
 You were seen in the emergency department for your constipation, blood in your urine and high blood sugar.  Your Foley catheter was not draining due to the blood in your urine and we were able to exchange this for you and irrigate out your bladder and is now draining normally.  Your urinary tract infection appears to be improving.  You should follow-up with urology as scheduled.  Your workup showed no signs of complication from your sugar being high and we improved with fluids and insulin  in the ER.  You should continue to monitor your sugars at home and follow-up with your primary in the next few days for blood sugar recheck and to determine if you need any medication changes.  I have given you a bowel regimen of medications that you should take for your constipation.  You can then take MiraLAX  every day until you are having regular soft bowel movements daily, then can cut the dose in half until you are again having regular soft bowel movements daily and then can use as needed.  You should also take the Senokot daily to help keep your stools soft.  Metamucil is a fiber supplement that you should take daily to help prevent recurrent constipation.  You should make sure that you are drinking plenty of fluids and staying well-hydrated.  You can follow-up with your primary doctor in the next few days to have your symptoms rechecked.  You should return to the emergency department if you are having significantly worsening pain, your catheter stops draining, your blood sugars are too high to read on your monitor or if you have any other new or concerning symptoms.

## 2024-10-09 NOTE — ED Triage Notes (Addendum)
 Patient BIB [facility] from home. Has had hyperglycemia for a month. Had a high sugar of 500s with EMS. Had a UTI last week and was discharged. Completed antibiotics. Has dementia.   EMS 20G left forearm fluid

## 2024-10-09 NOTE — ED Provider Notes (Signed)
 " [place] EMERGENCY DEPARTMENT AT [facility] Provider Note   CSN: [id number] Arrival date & time: 10/09/24  1206     Patient presents with: Hyperglycemia   Ryan Gilmore is a 89 y.o. male.   Patient is an 89 year old male with a past medical history of dementia, diabetes, VTE not on [facility] and recent hospitalization for UTI presenting to the emergency department with hyperglycemia.  Family reports that they have been checking his sugar at home and today his glucometer read high.  They state that they called EMS and his blood sugar was 600 for EMS.  They state that he had no changes to his diabetes medications during his last hospitalization and that he has been receiving all his medications since he has been home.  They report that he has been taking his antibiotics as prescribed and has had no fever since starting the antibiotics and denies any nausea or vomiting.  They state he has not been complaining of any pain but has been constipated since being home and has had bloody output from his Foley catheter with occasional blood clots.  They did say that the catheter output seemed decreased today from the last few days.  They also report concern for an ulcer on his right foot.  The history is provided by a relative. History limited by: Level 5 caveat for dementia.  Hyperglycemia      Prior to Admission medications  Medication Sig Start Date End Date Taking? Authorizing Provider  acetaminophen  (TYLENOL ) 325 MG tablet Take 2 tablets (650 mg total) by mouth every 6 (six) hours as needed for mild pain (pain score 1-3) or fever (or Fever >/= 101). 10/06/24  Yes Barbarann Nest, MD  amLODipine  (NORVASC ) 10 MG tablet Take 0.5 tablets (5 mg total) by mouth daily. 10/07/24 11/06/24 Yes Barbarann Nest, MD  amoxicillin  (AMOXIL ) 250 MG capsule Take 1 capsule (250 mg total) by mouth every 12 (twelve) hours for 3 days. 10/06/24 10/09/24 Yes Barbarann Nest, MD  aspirin  EC 81 MG tablet Take 81 mg by  mouth daily. Swallow whole.   Yes [provider]  Blood Glucose Monitoring Suppl (BLOOD GLUCOSE SYSTEM PAK) KIT Please dispense based on patient and insurance preference. Use as directed to monitor FSBS 2x. Dx: E11.E11.65. 03/24/19  Yes Duanne Butler DASEN, MD  empagliflozin (JARDIANCE) 25 MG TABS tablet Take 25 mg by mouth daily.   Yes [provider]  glipiZIDE  (GLUCOTROL ) 5 MG tablet Take by mouth 2 (two) times daily before a meal.   Yes [provider]  Glucose Blood (BLOOD GLUCOSE TEST STRIPS) STRP Please dispense one touch verio flex lancets, and testing strips. Use as directed to monitor FSBS 2x. Dx: E11.E11.65. 03/30/19  Yes Duanne Butler DASEN, MD  hydrocerin (EUCERIN) CREA Apply 1 Application topically 2 (two) times daily. Apply to feet/heels twice daily; do not place in between toes 10/06/24  Yes Barbarann Nest, MD  Lancets MISC Please dispense based on patient and insurance preference. Use as directed to monitor FSBS 2x. Dx: E11.E11.65. 03/24/19  Yes Duanne Butler DASEN, MD  liver oil-zinc  oxide (DESITIN) 40 % ointment Apply topically 2 (two) times daily. Apply to buttocks twice daily and as needed for soiling 10/06/24  Yes Barbarann Nest, MD  metFORMIN  (GLUCOPHAGE ) 500 MG tablet TAKE 2 TABLETS BY MOUTH TWICE DAILY WITH A MEAL 05/09/19  Yes Duanne Butler DASEN, MD  Multiple Vitamin (MULTIVITAMIN) tablet Take 1 tablet by mouth daily.   Yes [provider]  pioglitazone  (ACTOS ) 30 MG tablet Take 1 tablet (30 mg total) by mouth daily. 06/30/23  Yes Duanne Butler DASEN, MD  polyethylene glycol (MIRALAX ) 17 g packet Take 17 g by mouth daily. 10/09/24  Yes Ellouise, Srikar Chiang K, DO  psyllium (METAMUCIL SMOOTH TEXTURE) 58.6 % powder Take 1 packet by mouth 3 (three) times daily. 10/09/24  Yes Ellouise, Aldyn Toon K, DO  rosuvastatin  (CRESTOR ) 40 MG tablet Take 40 mg by mouth daily.   Yes [provider]  senna-docusate (SENOKOT-S) 8.6-50 MG tablet Take 1 tablet by mouth daily.  10/09/24  Yes Ellouise, Vonzella Althaus K, DO  tamsulosin  (FLOMAX ) 0.4 MG CAPS capsule Take 1 capsule (0.4 mg total) by mouth daily after supper. 10/06/24  Yes Barbarann Nest, MD    Allergies: Patient has no known allergies.    Review of Systems  Updated Vital Signs BP 133/89   Pulse (!) 102   Temp 98.2 F (36.8 C) (Oral)   Resp 17   Ht 5' 11 (1.803 m)   Wt 77 kg   SpO2 99%   BMI 23.68 kg/m   Physical Exam Vitals and nursing note reviewed.  Constitutional:      General: He is not in acute distress.    Appearance: Normal appearance.  HENT:     Head: Normocephalic.     Nose: Nose normal.     Mouth/Throat:     Mouth: Mucous membranes are moist.     Pharynx: Oropharynx is clear.  Eyes:     Extraocular Movements: Extraocular movements intact.     Conjunctiva/sclera: Conjunctivae normal.  Cardiovascular:     Rate and Rhythm: Normal rate and regular rhythm.     Heart sounds: Normal heart sounds.  Pulmonary:     Effort: Pulmonary effort is normal.     Breath sounds: Normal breath sounds.  Abdominal:     General: Abdomen is flat. There is distension (Suprapubic).     Palpations: Abdomen is soft.     Tenderness: There is abdominal tenderness (suprapubic).  Musculoskeletal:        General: Normal range of motion.     Cervical back: Normal range of motion.  Skin:    General: Skin is warm and dry.     Comments: ~1 cm ulceration to R heel, no surrounding erythema or warmth  Neurological:     General: No focal deficit present.     Mental Status: He is alert. Mental status is at baseline.  Psychiatric:        Mood and Affect: Mood normal.        Behavior: Behavior normal.     (all labs ordered are listed, but only abnormal results are displayed) Labs Reviewed  CBC - Abnormal; Notable for the following components:      Result Value   WBC 23.0 (*)    Hemoglobin 12.8 (*)    All other components within normal limits  URINALYSIS, ROUTINE W REFLEX MICROSCOPIC - Abnormal; Notable  for the following components:   Color, Urine STRAW (*)    APPearance HAZY (*)    Specific Gravity, Urine 1.002 (*)    Glucose, UA >=500 (*)    Hgb urine dipstick LARGE (*)    Bacteria, UA RARE (*)    All other components within normal limits  BASIC METABOLIC PANEL WITH GFR - Abnormal; Notable for the following components:   CO2 19 (*)    Glucose, Bld 482 (*)    BUN 24 (*)    Creatinine, Ser 2.40 (*)  Calcium  8.8 (*)    GFR, Estimated 25 (*)    All other components within normal limits  CBG MONITORING, ED - Abnormal; Notable for the following components:   Glucose-Capillary 419 (*)    All other components within normal limits  CBG MONITORING, ED - Abnormal; Notable for the following components:   Glucose-Capillary 351 (*)    All other components within normal limits  CBG MONITORING, ED    EKG: None  Radiology: No results found.   Procedures   Medications Ordered in the ED  sodium chloride  irrigation 0.9 % 3,000 mL (3,000 mLs Irrigation New Bag/Given 10/09/24 1442)  lactated ringers  bolus 1,000 mL (1,000 mLs Intravenous New Bag/Given 10/09/24 1317)  insulin  aspart (novoLOG ) injection 5 Units (5 Units Intravenous Given 10/09/24 1349)    Clinical Course as of 10/09/24 1809  Sun Oct 09, 2024  1426 Patient had >900 cc on bladder scan. Large clot came out when foley removed. Patient noted to have significant penile edema, no erythema or warmth, no scrotal swelling.  [VK]  1437 3-way catheter placed with 1600 cc of urine. Will irrigate bladder.  [VK]  1541 Repeat glucose improving.  [VK]  1614 Urine pink after bladder irrigation, no further clots visible.  [VK]  1700 Rare bacteria, no leuks. UA appears improved from hospitalization. Patient is stable for discharge home. Has urology follow up on 1/7 and recommended PCP follow up for glucose re-check. Family also requested bowel reg for constipation. [VK]    Clinical Course User Index [VK] Kingsley, Maejor Erven K, DO                                  Medical Decision Making This patient presents to the ED with chief complaint(s) of hyperglycemia with pertinent past medical history of Dementia, DM, recent admission for UTI which further complicates the presenting complaint. The complaint involves an extensive differential diagnosis and also carries with it a high risk of complications and morbidity.    The differential diagnosis includes hyperglycemic crisis, DKA/HHS, dehydration, electrolyte abnormality, infection, medication noncompliance  Additional history obtained: Additional history obtained from family Records reviewed previous admission documents  ED Course and Reassessment: On patient's arrival he is hemodynamically stable in no acute distress.  He had labs initiated in triage that shows an improving creatinine from time of discharge and hyperglycemia without DKA.  His white blood cell count is also downtrending.  Patient does have palpable distention in the suprapubic region concerning for urinary retention and will have a bladder scan.  If he is retaining will need to have catheter replaced and possible bladder irrigation with his hematuria.  Patient has no other signs of infection on exam.  He will be given fluids and insulin  for hyperglycemia and will be closely reassessed.  Independent labs interpretation:  The following labs were independently interpreted: hyperglycemia without DKA, improving UTI  Independent visualization of imaging: -N/A  Consultation: - Consulted or discussed management/test interpretation w/ external professional: N/A  Consideration for admission or further workup: Patient has no emergent conditions requiring admission or further work-up at this time and is stable for discharge home with primary care and urology follow-up  Social Determinants of health: N/A    Amount and/or Complexity of Data Reviewed Labs: ordered.  Risk OTC drugs. Prescription drug management.        Final diagnoses:  Hyperglycemia  Urinary retention  Gross hematuria  Constipation, unspecified constipation type  ED Discharge Orders          Ordered    polyethylene glycol (MIRALAX ) 17 g packet  Daily        10/09/24 1807    senna-docusate (SENOKOT-S) 8.6-50 MG tablet  Daily        10/09/24 1807    psyllium (METAMUCIL SMOOTH TEXTURE) 58.6 % powder  3 times daily        10/09/24 1807               Kingsley, Martie Fulgham K, DO 10/09/24 1809  "

## 2024-10-09 NOTE — ED Notes (Addendum)
 Bladder scanned pt. Showing 948 ml of urine in bladder.

## 2024-10-10 NOTE — Telephone Encounter (Signed)
 Copied from CRM [id number]. Topic: Clinical - Home Health Verbal Orders >> Oct 10, 2024  9:37 AM Viola FALCON wrote: Caller/Agency: Sharlet from [facility]  Callback Number: [phone] secure line  Service Requested: Skilled Nursing, physical therapy  Frequency: She needs a new verbal order to start on 10/12/24 Any new concerns about the patient? Yes, patient didn't answer the phone Saturday and his emergency contact didn't call back.

## 2024-10-11 ENCOUNTER — Telehealth: Payer: Self-pay | Admitting: *Deleted

## 2024-10-11 NOTE — Transitions of Care (Post Inpatient/ED Visit) (Signed)
 "  10/11/2024  Name: Ryan Gilmore MRN: [id number] DOB: 1933/03/22  Today's TOC FU Call Status: Today's TOC FU Call Status:: Successful TOC FU Call Completed TOC FU Call Complete Date: 10/11/24  [name] Name and Date of Birth confirmed. DOB, Name  Transition Care Management Follow-up Telephone Call Date of Discharge: 10/06/24 Discharge Facility: [facility]) Type of Discharge: Inpatient Admission Primary Inpatient Discharge Diagnosis:: UTI (urinary tract infection) How have you been since you were released from the hospital?:  (eating  very well, drinking adequate fluids, using walker for ambulation, foley catheter patent, draining well) Any questions or concerns?: No  Items Reviewed: Did you receive and understand the discharge instructions provided?: No Medications obtained,verified, and reconciled?: Yes (Medications Reviewed) Any new allergies since your discharge?: No Dietary orders reviewed?: Yes Type of Diet Ordered:: heart healthy, carbohydrate modified Do you have support at home?: Yes People in Home [RPT]: spouse Name of Support/Comfort Primary Source: [employer],   daughter Cam Broadnax checks on pt frequently  Medications Reviewed Today: Medications Reviewed Today     Reviewed by Aura Mliss LABOR, RN (Registered Nurse) on 10/11/24 at 1520  Med List Status: <None>   Medication Order Taking? Sig Documenting Provider Last Dose Status Informant  acetaminophen  (TYLENOL ) 325 MG tablet [id number] Yes Take 2 tablets (650 mg total) by mouth every 6 (six) hours as needed for mild pain (pain score 1-3) or fever (or Fever >/= 101). Barbarann Nest, MD  Active Family Member, Pharmacy Records  amLODipine  (NORVASC ) 10 MG tablet [id number] Yes Take 0.5 tablets (5 mg total) by mouth daily. Barbarann Nest, MD  Active Family Member, Pharmacy Records  aspirin  EC 81 MG tablet [id number] Yes Take 81 mg by mouth daily. Swallow whole. [provider]  Active Family Member,  Pharmacy Records  Blood Glucose Monitoring Suppl (BLOOD GLUCOSE SYSTEM PAK) KIT [id number] Yes Please dispense based on patient and insurance preference. Use as directed to monitor FSBS 2x. Dx: E11.E11.65. Duanne Butler DASEN, MD  Active Family Member, Pharmacy Records  empagliflozin (JARDIANCE) 25 MG TABS tablet [id number] Yes Take 25 mg by mouth daily. [provider]  Active Family Member, Pharmacy Records  glipiZIDE  (GLUCOTROL ) 5 MG tablet [id number] Yes Take by mouth 2 (two) times daily before a meal. [provider]  Active Family Member, Pharmacy Records  Glucose Blood (BLOOD GLUCOSE TEST STRIPS) STRP [id number] Yes Please dispense one touch verio flex lancets, and testing strips. Use as directed to monitor FSBS 2x. Dx: E11.E11.65. Duanne Butler DASEN, MD  Active Family Member, Pharmacy Records  hydrocerin [facility]) CREA [id number] Yes Apply 1 Application topically 2 (two) times daily. Apply to feet/heels twice daily; do not place in between toes Barbarann Nest, MD  Active Family Member, Pharmacy Records  Lancets MISC [id number] Yes Please dispense based on patient and insurance preference. Use as directed to monitor FSBS 2x. Dx: E11.E11.65. Duanne Butler DASEN, MD  Active Family Member, Pharmacy Records  liver oil-zinc  oxide (DESITIN) 40 % ointment [id number] Yes Apply topically 2 (two) times daily. Apply to buttocks twice daily and as needed for soiling Barbarann Nest, MD  Active Family Member, Pharmacy Records  metFORMIN  (GLUCOPHAGE ) 500 MG tablet [id number] Yes TAKE 2 TABLETS BY MOUTH TWICE DAILY WITH A MEAL Pickard, Butler DASEN, MD  Active Family Member, Pharmacy Records  Multiple Vitamin (MULTIVITAMIN) tablet [id number] Yes Take 1 tablet by mouth daily. [provider]  Active Family Member, Pharmacy Records  pioglitazone  (ACTOS ) 30 MG tablet [id number] Yes Take 1 tablet (30  mg total) by mouth daily. Duanne Butler DASEN, MD  Active Family Member, Pharmacy Records  polyethylene glycol  (MIRALAX ) 17 g packet [id number] Yes Take 17 g by mouth daily. Kingsley, Victoria K, DO  Active   psyllium (METAMUCIL SMOOTH TEXTURE) 58.6 % powder [id number] Yes Take 1 packet by mouth 3 (three) times daily. Kingsley, Victoria K, DO  Active   rosuvastatin  (CRESTOR ) 40 MG tablet [id number]  Take 40 mg by mouth daily.  Patient not taking: Reported on 10/11/2024   [provider]  Active Family Member, Pharmacy Records  senna-docusate (SENOKOT-S) 8.6-50 MG tablet [id number] Yes Take 1 tablet by mouth daily. Kingsley, Victoria K, DO  Active   tamsulosin  (FLOMAX ) 0.4 MG CAPS capsule [id number] Yes Take 1 capsule (0.4 mg total) by mouth daily after supper. Barbarann Nest, MD  Active Family Member, Pharmacy Records            Home Care and Equipment/Supplies: Were Home Health Services Ordered?: Yes Name of Home Health Agency:: [facility] Has Agency set up a time to come to your home?: Yes First Home Health Visit Date:  (spouse cannot remember the date but verified home health has made a visit) Any new equipment or medical supplies ordered?: No  Functional Questionnaire: Do you need assistance with bathing/showering or dressing?: No (spouse reports pt can bathe independently) Do you need assistance with meal preparation?: Yes (spouse provides oversight) Do you need assistance with eating?: No Do you have difficulty maintaining continence:  (indwelling foley catheter) Do you need assistance with getting out of bed/getting out of a chair/moving?: Yes (walker) Do you have difficulty managing or taking your medications?:  (daughter provides oversight, spouse assists some)  Follow up appointments reviewed: PCP Follow-up appointment confirmed?: No (spouse wants her daughter to schedule post follow up, declined for RN CM to schedule) MD Provider Line [phone] Given: No Specialist Hospital Follow-up appointment confirmed?: No (spouse is unsure if her daughter scheduled urology  appointment, will mention to daughter this evening to make sure appointment is scheduled with [facility]) Reason Specialist Follow-Up Not Confirmed: Patient has Specialist Provider Number and will Call for Appointment Do you need transportation to your follow-up appointment?: No Do you understand care options if your condition(s) worsen?: Yes-patient verbalized understanding  SDOH Interventions Today    Flowsheet Row Most Recent Value  SDOH Interventions   Food Insecurity Interventions Intervention Not Indicated  Housing Interventions Intervention Not Indicated  Transportation Interventions Intervention Not Indicated  Utilities Interventions Intervention Not Indicated    Goals Addressed             This Visit's Progress    VBCI Transitions of Care (TOC) Care Plan       Problems:  Recent Hospitalization for treatment of UTI (urinary tract infection) Knowledge Deficit Related to diabetes management, diet and No Hospital Follow Up Provider appointment spouse declines for RN CM to schedule post hospital follow up appointment, states her daughter handles all appointments and everything  daughter is at work, unable to speak with her today  Per spouse, home health has seen pt, is providing oversight for wound care as well as daughter is changing dressing to buttock/ sacral area as needed Pt has dementia, per spouse, varies day to day, states  some days he knows me, some days he doesn't Spouse reports CBG is monitored and my daughter keeps up with it, I don't know any readings  Spouse reports pt typically eats 2 packs of instant oatmeal, 1 slice of bread and  sausage for breakfast  Goal:  Over the next 30 days, the patient will not experience hospital readmission  Interventions:  Evaluation of current treatment plan related to UTI(urinary tract infection), self-management and patient's adherence to plan as established by provider. Discussed plans with patient for ongoing care  management follow up and provided patient with direct contact information for care management team Evaluation of current treatment plan related to UTI and patient's adherence to plan as established by provider Reviewed medications with patient and discussed importance of taking as prescribed Discussed plans with patient for ongoing care management follow up and provided patient with direct contact information for care management team Advised patient to discuss change in health status, issues with foley catheter, blood sugar, worsening skin integrity with provider Screening for signs and symptoms of depression related to chronic disease state  Assessed social determinant of health barriers Reviewed carbohydrate modified diet, importance of good CBG control for wound healing Reviewed signs/ symptoms of infection, UTI Reviewed care of foley catheter, using aseptic technique for emptying bag, cleaning around insertion site Reviewed safety precautions Reviewed that home health is a resource for issues with foley catheter, wound, with nurse on call if needed Reminded spouse that pt will need a follow up appointment with [facility] for voiding trial- spouse wrote this down and will let her daughter know to make sure appointment is scheduled Reviewed wound care orders from discharge summary- Free float heels to offload with pillows, wash feet daily and dry, apply Eucerin, Buttock/ sacrum/ coccyx area- apply silicone dressing q 3 days, observe daily by lifting edges, use barrier cream and keep skin clean and dry  Patient Self Care Activities:  Attend all scheduled provider appointments Call pharmacy for medication refills 3-7 days in advance of running out of medications Call provider office for new concerns or questions  Notify RN Care Manager of TOC call rescheduling needs Participate in Transition of Care Program/Attend TOC scheduled calls Take medications as prescribed   Report any signs of  infection, UTI to your doctor Drink adequate fluids, preferably water Include adequate protein at each meal to help with wound healing Be careful of how many carbohydrates you consume at each meal, decrease instant oatmeal as it has a lot of sugar  Plan:  Telephone follow up appointment with care management team member scheduled for:  10/19/24 @ 1015 am with Alan Ee RN The patient has been provided with contact information for the care management team and has been advised to call with any health related questions or concerns.         Mliss Creed [facility], BSN RN Care Manager/ Transition of Care / [facility] [phone]  "

## 2024-10-13 ENCOUNTER — Telehealth: Payer: Self-pay

## 2024-10-13 NOTE — Telephone Encounter (Signed)
 Copied from CRM [id number]. Topic: Clinical - Home Health Verbal Orders >> Oct 13, 2024  2:50 PM Zebedee SAUNDERS wrote: Caller/Agency:[facility] per Glade Rushing Number: [phone] Service Requested: Physical Therapy Frequency: 1w1, 2w4, 1w4 Any new concerns about the patient? No

## 2024-10-17 ENCOUNTER — Encounter: Payer: Self-pay | Admitting: Podiatry

## 2024-10-17 ENCOUNTER — Ambulatory Visit: Admitting: Podiatry

## 2024-10-17 DIAGNOSIS — L97401 Non-pressure chronic ulcer of unspecified heel and midfoot limited to breakdown of skin: Secondary | ICD-10-CM

## 2024-10-17 DIAGNOSIS — L97411 Non-pressure chronic ulcer of right heel and midfoot limited to breakdown of skin: Secondary | ICD-10-CM

## 2024-10-17 DIAGNOSIS — L97421 Non-pressure chronic ulcer of left heel and midfoot limited to breakdown of skin: Secondary | ICD-10-CM | POA: Diagnosis not present

## 2024-10-17 NOTE — Progress Notes (Signed)
 Presents with blisters on the heels bilaterally.  Patient is a type II diabetic.  He was hospitalized and got these ulcerations while hospitalized at [facility].  No fever chills nausea or vomiting.  Did not notice clear drainage.  Have not noticed any purulence.  No strong odors.  Physical Exam:  Patient alert and oriented x 3.  No complaints of nausea, vomiting, fever, or chills  Vascular: DP pulses 2/4 bilateral. PT pulses 1/4 lateral. Mild edema lower extremity. Capillary fill time immediate bilaterally. Dermatologic: Superficial ulcer with lysis of skin and drained blister plantar posterior aspect heel left.  This penetrates into the dermis.  At the central aspect of the ulcer there is some dermal duskiness.  No penetration into subcutaneous tissue.  Moderate clear drainage serous.  No signs of infection.  Tenderness.  Ulcer measures 70 x 80 mm x 1.5 mm deep  Superficial ulceration 15 mm x 15 mm x 1.5 mm deep plantar left plantar posterior lateral heel right.  Just penetrates into the dermis.  Mild to moderate serous drainage.  No signs of infection no penetration of the subcutaneous tissue.SABRA Neurologic: Sharp touch intact feet bilaterally.  Light touch intact bilaterally  Musculoskeletal: Hammertoes bilaterally.  Diagnoses: 1.  Superficial ulceration Wagner grade 1 heels bilaterally  Plan: - Stab JAP visit for evaluation and management level 3. - Discussed superficial ulcerations around the heel.  Discussed etiology and treatment.  Discussed importance of keeping pressure off these areas while the heel -Sharp debridement with tissue nippers superficial Wagner grade 1 ulceration heels bilaterally.  Debrided any devitalized tissue with a tissue nipper.  Applied antibiotic ointment and a light dressing -Wound care: Soak feet daily warm salt water, apply Bactroban  ointment twice daily, apply light dressing. - Dispensed surgical shoes bilaterally to offload. - Keep feet elevated and off  any surfaces with pressure by keeping pillows or other supports under the lower legs bilaterally  Return 1 week f/u ulcer

## 2024-10-18 ENCOUNTER — Telehealth: Payer: Self-pay | Admitting: Podiatry

## 2024-10-18 MED ORDER — MUPIROCIN 2 % EX OINT: 1.0000 | TOPICAL_OINTMENT | Freq: Two times a day (BID) | CUTANEOUS | 0 refills | Status: AC

## 2024-10-18 NOTE — Telephone Encounter (Signed)
 Was seen 10/17/24 and a prescription cream was supposed to sent and the pharmacy on file has not rec'ved. Can you please verify and resend to [facility] on file. Thank you.

## 2024-10-19 ENCOUNTER — Telehealth: Payer: Self-pay

## 2024-10-19 ENCOUNTER — Other Ambulatory Visit: Payer: Self-pay

## 2024-10-19 ENCOUNTER — Ambulatory Visit: Payer: Self-pay

## 2024-10-19 NOTE — Telephone Encounter (Signed)
 FYI Only or Action Required?: Action required by provider: update on patient condition and daughter in need of some clarity when giving oral medications for blood sugar, sugars have been trending up since hospitalized;  will see provider tomorrow for follow-up and discuss constipation that has been ongoing.  Patient was last seen in primary care on 06/02/2024 by Ryan Butler DASEN, MD.  Called Nurse Triage reporting Blood Sugar Problem and Constipation.  Symptoms began several weeks ago.  Interventions attempted: OTC medications: Miralax , Prescription medications: Glipizide , Metformin , Rest, hydration, or home remedies, and Dietary changes.  Symptoms are: stable.  Triage Disposition: See Physician Within 24 Hours, Home Care  Patient/caregiver understands and will follow disposition?: Yes      Copied from CRM [id number]. Topic: Clinical - Red Word Triage >> Oct 19, 2024  5:01 PM Ryan Gilmore wrote: Red Word that prompted transfer to Nurse Triage:   Pt is having issues with blood glucose and it is running extremely high. Today Morning 9 am 326 Lunch 11 am 440 1 pm 401     Reason for Disposition  [1] Blood glucose 240 - 300 mg/dL (86.6 - 83.2 mmol/L) AND [2] does not use insulin  (e.g., not insulin -dependent; most people with type 2 diabetes)    Oral medications.  Last bowel movement (BM) > 4 days ago    BM on Monday, however very small.  Hospital follow-up already scheduled for tomorrow.  Answer Assessment - Initial Assessment Questions 1. BLOOD GLUCOSE: What is your blood glucose level?      Daughter just arrives to house to check blood sugar, checking upon arrival. Blood sugar checked during call is 253.   2. ONSET: When did you check the blood glucose?     253 now  Daughter was told if patient does not eat, do not give Glipizide .  Patient did not eat last night, thus held glipizide .  Sugar upon awakening this AM was 326.   Patient started eating breakfast at 9:45 AM, ate  low sugar oatmeal, apple with cinnamon and half a banana.  Sugar was checked at 11:00 AM and noted to be 441. Then decreased to 401 around 1:00 PM.   Patient had some bites of pinto beans and bacon around 2 PM.   Patient just started eating when blood sugar checked now.  Is currently eating lima beans, carrots, and Turkey (protein).  Eats a few bites of each, then done.   Daughter inquires what she should give tonight.  Denies patient having any symptoms of hyperglycemia but has been constipated- see alternative assessment.   Feels uncertain about giving medication since is eating but not eating much.   USUAL RANGE: What is your glucose level usually? (e.g., usual fasting morning value, usual evening value)     Have been increasingly recently closer to 300 range. Checks periodically and not always before a meal based on conversation.   KETONES: Do you check for ketones (urine or blood test strips)? If Yes, ask: What does the test show now?      Glucometer does not measure ketones.   TYPE 1 or 2:  Do you know what type of diabetes you have?  (e.g., Type 1, Type 2, Gestational; doesn't know)      Type 2 per chart  INSULIN : Do you take insulin ? What type of insulin (s) do you use? What is the mode of delivery? (syringe, pen; injection or pump)?      No  DIABETES PILLS: Do you take any pills for your  diabetes? If Yes, ask: Have you missed taking any pills recently?     Glipizide , Metformin ;  Daughter reports sugars have been trending up since discharge from hospital.   OTHER SYMPTOMS: Do you have any symptoms? (e.g., fever, frequent urination, difficulty breathing, dizziness, weakness, vomiting)     Awake and alert, interacts with daughter.  Answer Assessment - Initial Assessment Questions STOOL PATTERN OR FREQUENCY: How often do you have a bowel movement (BM)?  (Normal range: 3 times a day to every 3 days)  When was your last BM?       Has not had normal BM for 2 weeks  since being in hospital.  Did have a very small BM on Monday.   ONSET: When did the constipation begin?     2 weeks ago  RECTAL PAIN: Does your rectum hurt when the stool comes out? If Yes, ask: Do you have hemorrhoids? How bad is the pain?  (Scale 1-10; or mild, moderate, severe)     No complaints   BLOOD ON STOOLS: Has there been any blood on the toilet tissue or on the surface of the BM? If Yes, ask: When was the last time?     No reported blood in stool on Monday.   CHRONIC CONSTIPATION: Is this a new problem for you?  If No, ask: How long have you had this problem? (days, weeks, months)      Daughter denies any history, patient is not on any strong pain medications she reports.   CHANGES IN DIET OR HYDRATION: Have there been any recent changes in your diet? How much fluids are you drinking on a daily basis?  How much have you had to drink today?     Recently hospitalized; blood sugars have been trending up- see alternative assessment.   LAXATIVES: Have you been using any stool softeners, laxatives, or enemas?  If Yes, ask What are you using, how often, and when was the last time?       Currently giving miralax  with water during call.    OTHER SYMPTOMS: Do you have any other symptoms? (e.g., abdomen pain, bloating, fever, vomiting)       Denies, no complaints of nausea/ vomiting/ pain.  Protocols used: Diabetes - High Blood Sugar-A-AH, Constipation-A-AH

## 2024-10-19 NOTE — Telephone Encounter (Signed)
 FYI Only or Action Required?: Action required by provider: update on patient condition and will call back within 1 hour after hydration and PT.  Patient was last seen in primary care on 06/02/2024 by Duanne Butler DASEN, MD.  Called Nurse Triage reporting Blood Sugar Problem.  Symptoms began today.  Interventions attempted: Prescription medications: all DM oral meds.  Symptoms are: unchanged.  Triage Disposition: Call PCP Now  Patient/caregiver understands and will follow disposition?:    Copied from CRM [id number]. Topic: Clinical - Red Word Triage >> Oct 19, 2024  4:08 PM Tobias CROME wrote: Red Word that prompted transfer to Nurse Triage: Patient has been over 300 all day and even got over 400. Just took it today and it was at 45. Reason for Disposition  [1] Blood glucose > 300 mg/dL (83.2 mmol/L) AND [7] two or more times in a row  Answer Assessment - Initial Assessment Questions Nurse advised patient to hydrate with water and continue with physical therapy. Recheck BS in 1 hour and call back with results, unless symptomatic.  Advised 911 IF: * Vomiting occurs * Rapid breathing occurs, slurred speech, confusion * You become worse  Call back if no symptoms and blood glucose is not returning to baseline  ED if blood glucose elevates, not returning to baseline.   Physical therapist, pt's wife verbalized understanding.  Patient already has hospital f/u appt scheduled tomorrow.  1. BLOOD GLUCOSE: What is your blood glucose level?      338 elevated now; baseline 190 BS checked this morning and afternoons; running in 300s 2. ONSET: When did you check the blood glucose?     Today; just 5 minutes ago 3. USUAL RANGE: What is your glucose level usually? (e.g., usual fasting morning value, usual evening value)     Just ate cookie 4. KETONES: Do you check for ketones (urine or blood test strips)? If Yes, ask: What does the test show now?      no 5. TYPE 1 or 2:  Do you know  what type of diabetes you have?  (e.g., Type 1, Type 2, Gestational; doesn't know)      Type 2  6. INSULIN : Do you take insulin ? What type of insulin (s) do you use? What is the mode of delivery? (syringe, pen; injection or pump)?      no 7. DIABETES PILLS: Do you take any pills for your diabetes? If Yes, ask: Have you missed taking any pills recently?     Yes, 8. OTHER SYMPTOMS: Do you have any symptoms? (e.g., fever, frequent urination, difficulty breathing, dizziness, weakness, vomiting)   Denies confusion, altered mental status, slurred speech, diff breathing, chest pain, dizziness, weakness, fever chills, n/v/d  Reports awake and alert, responds appropriately, patient reports I'm fine.  Protocols used: Diabetes - High Blood Sugar-A-AH

## 2024-10-19 NOTE — Transitions of Care (Post Inpatient/ED Visit) (Signed)
 " Transition of Care week 2  Visit Note  10/19/2024  Name: Ryan Gilmore MRN: [id number]          DOB: 1933-01-20  Situation: Patient enrolled in [facility] 30-day program. Visit completed with daughter by telephone.   Background:   Initial Transition Care Management Follow-up Telephone Call Discharge Date and Diagnosis: 10/06/24, UTI (urinary tract infection)   Past Medical History:  Diagnosis Date   Bleeding duodenal ulcer    ?  1980s   Dementia (HCC)    Diabetes mellitus without complication (HCC)    Fall 09/30/2024   Hyperlipidemia     Assessment:  Patient with high CBG today. Daughter reports recent high CBG since being in the hospital. Daughter does not want to do insulin . Reports continues to have pressure ulcers to buttocks. Patient not eating well and not taking medications well. States patient is hold medications in his mouth and not swallowing. Daughter reports no BM for 13-14 days.  Reviewed that this is a big concern. Continues to have a foley as he failed voiding trial.  Patient Reported Symptoms: Cognitive Cognitive Status: Other: (phone call with daughter only- daughter states that patient is confused all the time.)      Neurological Neurological Review of Symptoms: Other: [place]her Neurological Symptoms/Conditions [RPT]: dementia Neurological Management Strategies: Routine screening Neurological Self-Management Outcome: 4 (good)  HEENT HEENT Symptoms Reported: No symptoms reported HEENT Management Strategies: Routine screening    Cardiovascular Cardiovascular Symptoms Reported: No symptoms reported, Swelling in legs or feet Cardiovascular Comment: Daughter reports that patietn has bilateral foot swelling and that she took [facility] due to blisters on feet.  Respiratory Respiratory Symptoms Reported: No symptoms reported Respiratory Management Strategies: Routine screening  Endocrine Is patient diabetic?: Yes Is patient checking blood sugars at  home?: Yes List most recent blood sugar readings, include date and time of day: During call CBG of 440. Endocrine Self-Management Outcome: 2 (bad) Endocrine Comment: Stayed on the phone with daughter until medications were taken by patient.  Reviewed concern with daughter about uncontrolled Dm.  Urged daughter to ensure that patient is swallowing each tablet of his medications. Reviewed not to withhold medications.   Reviewed importance of healthy diet with added protein ffor wound healing.  Gastrointestinal Gastrointestinal Symptoms Reported: Constipation Additional Gastrointestinal Details: NO BM for 13 days.  Daughter reports that she is giving miralax .   no history of constipation. Gastrointestinal Self-Management Outcome: 4 (good) Gastrointestinal Comment: Stayed on the phone with daughter while she gave patient Miralax .    Genitourinary Genitourinary Symptoms Reported: Other Other Genitourinary Symptoms: foley catheter.   daughter states some bleeding at the tip of the penis where catheter is. Genitourinary Management Strategies: Catheter, indwelling  Integumentary Integumentary Symptoms Reported: Wound Additional Integumentary Details: Blisters to feet- seen at [facility] this week. Swelling per daughter to both lower legs. Skin Management Strategies: Medical device, Medication therapy, Routine screening Skin Self-Management Outcome: 3 (uncertain)  Musculoskeletal Musculoskelatal Symptoms Reviewed: Limited mobility Additional Musculoskeletal Details: sits alot.  does not want to walk per daughter. when he does walk he uses a walker. Musculoskeletal Management Strategies: Medical device, Routine screening Musculoskeletal Comment: Active with PT. Reviewed importance of movement and staying off bottom. Encouraged patient to be ambulated frequently during the day.      Psychosocial Psychosocial Symptoms Reported: Not assessed (phone call with daughter only)         There were no  vitals filed for this visit. Pain  Scale: 0-10 Pain Score: 0-No pain  Medications Reviewed Today     Reviewed by Rumalda Alan PENNER, RN (Registered Nurse) on 10/19/24 at 1030  Med List Status: <None>   Medication Order Taking? Sig Documenting Provider Last Dose Status Informant  acetaminophen  (TYLENOL ) 325 MG tablet [id number] Yes Take 2 tablets (650 mg total) by mouth every 6 (six) hours as needed for mild pain (pain score 1-3) or fever (or Fever >/= 101). Barbarann Nest, MD  Active Family Member, Pharmacy Records  amLODipine  (NORVASC ) 10 MG tablet [id number] Yes Take 0.5 tablets (5 mg total) by mouth daily. Barbarann Nest, MD  Active Family Member, Pharmacy Records  aspirin  EC 81 MG tablet [id number] Yes Take 81 mg by mouth daily. Swallow whole. [provider]  Active Family Member, Pharmacy Records  Blood Glucose Monitoring Suppl (BLOOD GLUCOSE SYSTEM PAK) KIT [id number] Yes Please dispense based on patient and insurance preference. Use as directed to monitor FSBS 2x. Dx: E11.E11.65. Duanne Butler DASEN, MD  Active Family Member, Pharmacy Records  empagliflozin (JARDIANCE) 25 MG TABS tablet [id number] Yes Take 25 mg by mouth daily. [provider]  Active Family Member, Pharmacy Records  glipiZIDE  (GLUCOTROL ) 5 MG tablet [id number] Yes Take by mouth 2 (two) times daily before a meal. [provider]  Active Family Member, Pharmacy Records  Glucose Blood (BLOOD GLUCOSE TEST STRIPS) STRP [id number] Yes Please dispense one touch verio flex lancets, and testing strips. Use as directed to monitor FSBS 2x. Dx: E11.E11.65. Duanne Butler DASEN, MD  Active Family Member, Pharmacy Records  hydrocerin [facility]) CREA [id number] Yes Apply 1 Application topically 2 (two) times daily. Apply to feet/heels twice daily; do not place in between toes Barbarann Nest, MD  Active Family Member, Pharmacy Records  Lancets MISC [id number] Yes Please dispense based on patient and insurance preference. Use as  directed to monitor FSBS 2x. Dx: E11.E11.65. Duanne Butler DASEN, MD  Active Family Member, Pharmacy Records  liver oil-zinc  oxide (DESITIN) 40 % ointment [id number] Yes Apply topically 2 (two) times daily. Apply to buttocks twice daily and as needed for soiling Barbarann Nest, MD  Active Family Member, Pharmacy Records  metFORMIN  (GLUCOPHAGE ) 500 MG tablet [id number] Yes TAKE 2 TABLETS BY MOUTH TWICE DAILY WITH A MEAL Pickard, Butler DASEN, MD  Active Family Member, Pharmacy Records  Multiple Vitamin (MULTIVITAMIN) tablet [id number] Yes Take 1 tablet by mouth daily. [provider]  Active Family Member, Pharmacy Records  mupirocin  ointment (BACTROBAN ) 2 % [id number]  Apply 1 Application topically 2 (two) times daily. Apply1 gram to wound.  Patient not taking: Reported on 10/19/2024   Christine Rush, DPM  Active   pioglitazone  (ACTOS ) 30 MG tablet [id number] Yes Take 1 tablet (30 mg total) by mouth daily. Duanne Butler DASEN, MD  Active Family Member, Pharmacy Records  polyethylene glycol (MIRALAX ) 17 g packet [id number] Yes Take 17 g by mouth daily. Kingsley, Victoria K, DO  Active   psyllium (METAMUCIL SMOOTH TEXTURE) 58.6 % powder [id number] Yes Take 1 packet by mouth 3 (three) times daily. Kingsley, Victoria K, DO  Active   rosuvastatin  (CRESTOR ) 40 MG tablet [id number] Yes Take 40 mg by mouth daily. [provider]  Active Family Member, Pharmacy Records  senna-docusate (SENOKOT-S) 8.6-50 MG tablet [id number] Yes Take 1 tablet by mouth daily. Kingsley, Victoria K, DO  Active   tamsulosin  (FLOMAX ) 0.4 MG CAPS capsule [id number] Yes Take 1 capsule (0.4 mg total) by mouth daily after supper. Barbarann Nest, MD  Active Family Member, Pharmacy  Records            Goals Addressed             This Visit's Progress    VBCI Transitions of Care (TOC) Care Plan       Problems:  Recent Hospitalization for treatment of UTI (urinary tract infection)-10/19/2024  daughter states that patient failed  voiding trial and still has catheter.  Next voiding trial on 11/08/2024 Knowledge Deficit Related to diabetes management, diet and No Hospital Follow Up Provider appointment spouse declines for RN CM to schedule post hospital follow up appointment, states her daughter handles all appointments and everything  daughter is at work, unable to speak with her today  10/19/2024   Spoke with daughter today and she states that both parents have dementia.  Reports that she is preparing to move in with her parents to take care of them.    Daughter reports high CBG today, patient not eating well, no BM for 14 days. Sleeps a lot, 3 pressure ulcers, difficulty to get patient to take his medications- reports that he holds meds in his mouth.  Per spouse, home health has seen pt, is providing oversight for wound care as well as daughter is changing dressing to buttock/ sacral area as needed  10/19/2024  PT coming today. Nurse coming tomorrow.  Pt has dementia, per spouse, varies day to day, states  some days he knows me, some days he doesn't Spouse reports CBG is monitored and my daughter keeps up with it, I don't know any readings  Spouse reports pt typically eats 2 packs of instant oatmeal, 1 slice of bread and sausage for breakfast,   10/19/2024  fasting CBG today of 326. Daughter reports that she did not give patient his medications last night because patient did not eat dinner.  Patient ate breakfast this am ( oatmeal and a bananna) and patient did not take medications and patient is back asleep.  ACTION ITEMS DURING CALL:  Reviewed CBG after breakfast of 440.( During this call)Encouraged daughter to give patient all his DM medications and mirlax during call.  I remained on the line while patient took medications.  Reviewed concerns about constipation and no BM for 14 days.  Urgently requested patient see PCP this week.  Goal:  Over the next 30 days, the patient will not experience hospital readmission  Interventions:   Discussed plans with daughter for ongoing care management follow up and provided patient with direct contact information for care management team Reviewed medications with patient and discussed importance of taking as prescribed- reviewed each medication with daughter and updated EMR.  Assess CBG today and fasting is 326 and post breakfast is 440.  Reviewed trends of A1c with daughter and concerns for high A1c.  Daughter reports that patient goes to the [facility] for DM treatment. Reviewed carbohydrate modified diet, importance of good CBG control for wound healing Reviewed signs/ symptoms of infection, UTI- discussed voiding trial and patient did not pass. Continues to have foley.  Daughter reports blood at insertion site of the penis.  No blood in foley bag.   Reviewed care of foley catheter, using aseptic technique for emptying bag, cleaning around insertion site Reviewed that home health is a resource for issues with foley catheter, wound, with nurse on call if needed Reviewed wound care orders from discharge summary- Free float heels to offload with pillows, wash feet daily and dry, apply Eucerin, Buttock/ sacrum/ coccyx area- apply silicone dressing q 3 days, observe  daily by lifting edges, use barrier cream and keep skin clean and dry Reviewed office visit at [facility] this week for blisters. Daughter to get new ointment today.  Reviewed concerns for elevated CBG and patient not taking his medications well. Daughter states she is finding that patient is not swallowing his pills. Suggested to give 1 pill at a time and then inspect mouth to make sure pill was swallowed.   Reviewed when CBG is high that it predisposes patient to a UTI.  Reviewed importance of following DM diet. Daughter thinks that her mother is giving patient cookies.  Daughter reports that patient is not eating lunch or supper. Mostly just breakfast with oatmeal and a banana.  Reviewed importance of having a BM.  Encouraged  daughter to have homehealth nurse check patient for an impaction.  Reviewed great concerns for patient not having a BM for 13-14 days.  Denies NV. Reviewed importance of skin care- due to 3 pressure areas ( in the crack ) per daughter the size of a penny.  Reviewed the importance of keeping skin clean and dry. Reviewed importance of keeping pressure off bottom.  Encouraged patient to ambulate.  According to daughter- pain only want to sit.  Confirmed patient is active with PT and therapist is coming today.   Patient Self Care Activities:  Attend all scheduled provider appointments Call pharmacy for medication refills 3-7 days in advance of running out of medications Call provider office for new concerns or questions  Notify RN Care Manager of TOC call rescheduling needs Participate in Transition of Care Program/Attend TOC scheduled calls Take medications as prescribed   Report any signs of infection, UTI to your doctor Drink adequate fluids, preferably water Include adequate protein at each meal to help with wound healing Be careful of how many carbohydrates you consume at each meal, decrease instant oatmeal as it has a lot of sugar Change positions frequently, keep bottom clean and dry. Take all medications as prescribed. Take Miralx.  Call MD for continued high CBG reading and no BM Scheduled hospital follow up asap. Discuss concerns with home health nurse tomorrow.    Plan:  Telephone follow up appointment with care management team member scheduled for:  10/21/24 @ 11 am with Alan Ee RN The patient has been provided with contact information for the care management team and has been advised to call with any health related questions or concerns.         Goals Addressed             This Visit's Progress    VBCI Transitions of Care (TOC) Care Plan       Problems:  Recent Hospitalization for treatment of UTI (urinary tract infection)-10/19/2024  daughter states that patient  failed voiding trial and still has catheter.  Next voiding trial on 11/08/2024 Knowledge Deficit Related to diabetes management, diet and No Hospital Follow Up Provider appointment spouse declines for RN CM to schedule post hospital follow up appointment, states her daughter handles all appointments and everything  daughter is at work, unable to speak with her today  10/19/2024   Spoke with daughter today and she states that both parents have dementia.  Reports that she is preparing to move in with her parents to take care of them.    Daughter reports high CBG today, patient not eating well, no BM for 14 days. Sleeps a lot, 3 pressure ulcers, difficulty to get patient to take his medications- reports that he  holds meds in his mouth.  Per spouse, home health has seen pt, is providing oversight for wound care as well as daughter is changing dressing to buttock/ sacral area as needed  10/19/2024  PT coming today. Nurse coming tomorrow.  Pt has dementia, per spouse, varies day to day, states  some days he knows me, some days he doesn't Spouse reports CBG is monitored and my daughter keeps up with it, I don't know any readings  Spouse reports pt typically eats 2 packs of instant oatmeal, 1 slice of bread and sausage for breakfast,   10/19/2024  fasting CBG today of 326. Daughter reports that she did not give patient his medications last night because patient did not eat dinner.  Patient ate breakfast this am ( oatmeal and a bananna) and patient did not take medications and patient is back asleep.  ACTION ITEMS DURING CALL:  Reviewed CBG after breakfast of 440.( During this call)Encouraged daughter to give patient all his DM medications and mirlax during call.  I remained on the line while patient took medications.  Reviewed concerns about constipation and no BM for 14 days.  Urgently requested patient see PCP this week.  Goal:  Over the next 30 days, the patient will not experience hospital  readmission  Interventions:  Discussed plans with daughter for ongoing care management follow up and provided patient with direct contact information for care management team Reviewed medications with patient and discussed importance of taking as prescribed- reviewed each medication with daughter and updated EMR.  Assess CBG today and fasting is 326 and post breakfast is 440.  Reviewed trends of A1c with daughter and concerns for high A1c.  Daughter reports that patient goes to the [place] for DM treatment. Reviewed carbohydrate modified diet, importance of good CBG control for wound healing Reviewed signs/ symptoms of infection, UTI- discussed voiding trial and patient did not pass. Continues to have foley.  Daughter reports blood at insertion site of the penis.  No blood in foley bag.   Reviewed care of foley catheter, using aseptic technique for emptying bag, cleaning around insertion site Reviewed that home health is a resource for issues with foley catheter, wound, with nurse on call if needed Reviewed wound care orders from discharge summary- Free float heels to offload with pillows, wash feet daily and dry, apply Eucerin, Buttock/ sacrum/ coccyx area- apply silicone dressing q 3 days, observe daily by lifting edges, use barrier cream and keep skin clean and dry Reviewed office visit at [facility] this week for blisters. Daughter to get new ointment today.  Reviewed concerns for elevated CBG and patient not taking his medications well. Daughter states she is finding that patient is not swallowing his pills. Suggested to give 1 pill at a time and then inspect mouth to make sure pill was swallowed.   Reviewed when CBG is high that it predisposes patient to a UTI.  Reviewed importance of following DM diet. Daughter thinks that her mother is giving patient cookies.  Daughter reports that patient is not eating lunch or supper. Mostly just breakfast with oatmeal and a banana.  Reviewed importance of  having a BM.  Encouraged daughter to have homehealth nurse check patient for an impaction.  Reviewed great concerns for patient not having a BM for 13-14 days.  Denies NV. Reviewed importance of skin care- due to 3 pressure areas ( in the crack ) per daughter the size of a penny.  Reviewed the importance of keeping skin  clean and dry. Reviewed importance of keeping pressure off bottom.  Encouraged patient to ambulate.  According to daughter- pain only want to sit.  Confirmed patient is active with PT and therapist is coming today.   Patient Self Care Activities:  Attend all scheduled provider appointments Call pharmacy for medication refills 3-7 days in advance of running out of medications Call provider office for new concerns or questions  Notify RN Care Manager of TOC call rescheduling needs Participate in Transition of Care Program/Attend TOC scheduled calls Take medications as prescribed   Report any signs of infection, UTI to your doctor Drink adequate fluids, preferably water Include adequate protein at each meal to help with wound healing Be careful of how many carbohydrates you consume at each meal, decrease instant oatmeal as it has a lot of sugar Change positions frequently, keep bottom clean and dry. Take all medications as prescribed. Take Miralx.  Call MD for continued high CBG reading and no BM Scheduled hospital follow up asap. Discuss concerns with home health nurse tomorrow.    Plan:  Telephone follow up appointment with care management team member scheduled for:  10/21/24 @ 11 am with Alan Ee RN The patient has been provided with contact information for the care management team and has been advised to call with any health related questions or concerns.         Recommendation:  This note sent high priority to MD.  Monitor CBG more frequently today and call MD for high readings. Call home health nurse as well.   Follow Up Plan:   Telephone follow up appointment  date/time:  10/21/2024 at 11 am  Alan Ee, RN, BSN, [employer]- Transition of Care Team.  [facility] [phone]    "

## 2024-10-19 NOTE — Patient Instructions (Signed)
 Visit Information  Thank you for taking time to visit with me today. Please don't hesitate to contact me if I can be of assistance to you before our next scheduled telephone appointment.  Following are the goals we discussed today:   Goals Addressed             This Visit's Progress    [facility] Transitions of Care (TOC) Care Plan       Problems:  Recent Hospitalization for treatment of UTI (urinary tract infection)-10/19/2024  daughter states that patient failed voiding trial and still has catheter.  Next voiding trial on 11/08/2024 Knowledge Deficit Related to diabetes management, diet and No Hospital Follow Up Provider appointment spouse declines for RN CM to schedule post hospital follow up appointment, states her daughter handles all appointments and everything  daughter is at work, unable to speak with her today  10/19/2024   Spoke with daughter today and she states that both parents have dementia.  Reports that she is preparing to move in with her parents to take care of them.    Daughter reports high CBG today, patient not eating well, no BM for 14 days. Sleeps a lot, 3 pressure ulcers, difficulty to get patient to take his medications- reports that he holds meds in his mouth.  Per spouse, home health has seen pt, is providing oversight for wound care as well as daughter is changing dressing to buttock/ sacral area as needed  10/19/2024  PT coming today. Nurse coming tomorrow.  Pt has dementia, per spouse, varies day to day, states  some days he knows me, some days he doesn't Spouse reports CBG is monitored and my daughter keeps up with it, I don't know any readings  Spouse reports pt typically eats 2 packs of instant oatmeal, 1 slice of bread and sausage for breakfast,   10/19/2024  fasting CBG today of 326. Daughter reports that she did not give patient his medications last night because patient did not eat dinner.  Patient ate breakfast this am ( oatmeal and a bananna) and patient did not  take medications and patient is back asleep.  ACTION ITEMS DURING CALL:  Reviewed CBG after breakfast of 440.( During this call)Encouraged daughter to give patient all his DM medications and mirlax during call.  I remained on the line while patient took medications.  Reviewed concerns about constipation and no BM for 14 days.  Urgently requested patient see PCP this week.  Goal:  Over the next 30 days, the patient will not experience hospital readmission  Interventions:  Discussed plans with daughter for ongoing care management follow up and provided patient with direct contact information for care management team Reviewed medications with patient and discussed importance of taking as prescribed- reviewed each medication with daughter and updated EMR.  Assess CBG today and fasting is 326 and post breakfast is 440.  Reviewed trends of A1c with daughter and concerns for high A1c.  Daughter reports that patient goes to the [facility] for DM treatment. Reviewed carbohydrate modified diet, importance of good CBG control for wound healing Reviewed signs/ symptoms of infection, UTI- discussed voiding trial and patient did not pass. Continues to have foley.  Daughter reports blood at insertion site of the penis.  No blood in foley bag.   Reviewed care of foley catheter, using aseptic technique for emptying bag, cleaning around insertion site Reviewed that home health is a resource for issues with foley catheter, wound, with nurse on call if needed Reviewed wound  care orders from discharge summary- Free float heels to offload with pillows, wash feet daily and dry, apply Eucerin, Buttock/ sacrum/ coccyx area- apply silicone dressing q 3 days, observe daily by lifting edges, use barrier cream and keep skin clean and dry Reviewed office visit at [facility] this week for blisters. Daughter to get new ointment today.  Reviewed concerns for elevated CBG and patient not taking his medications well. Daughter states she  is finding that patient is not swallowing his pills. Suggested to give 1 pill at a time and then inspect mouth to make sure pill was swallowed.   Reviewed when CBG is high that it predisposes patient to a UTI.  Reviewed importance of following DM diet. Daughter thinks that her mother is giving patient cookies.  Daughter reports that patient is not eating lunch or supper. Mostly just breakfast with oatmeal and a banana.  Reviewed importance of having a BM.  Encouraged daughter to have homehealth nurse check patient for an impaction.  Reviewed great concerns for patient not having a BM for 13-14 days.  Denies NV. Reviewed importance of skin care- due to 3 pressure areas ( in the crack ) per daughter the size of a penny.  Reviewed the importance of keeping skin clean and dry. Reviewed importance of keeping pressure off bottom.  Encouraged patient to ambulate.  According to daughter- pain only want to sit.  Confirmed patient is active with PT and therapist is coming today.   Patient Self Care Activities:  Attend all scheduled provider appointments Call pharmacy for medication refills 3-7 days in advance of running out of medications Call provider office for new concerns or questions  Notify RN Care Manager of TOC call rescheduling needs Participate in Transition of Care Program/Attend TOC scheduled calls Take medications as prescribed   Report any signs of infection, UTI to your doctor Drink adequate fluids, preferably water Include adequate protein at each meal to help with wound healing Be careful of how many carbohydrates you consume at each meal, decrease instant oatmeal as it has a lot of sugar Change positions frequently, keep bottom clean and dry. Take all medications as prescribed. Take Miralx.  Call MD for continued high CBG reading and no BM Scheduled hospital follow up asap. Discuss concerns with home health nurse tomorrow.    Plan:  Telephone follow up appointment with care management  team member scheduled for:  10/21/24 @ 11 am with Alan Ee RN The patient has been provided with contact information for the care management team and has been advised to call with any health related questions or concerns.          Our next appointment is by telephone on 10/21/2024 at 11 am  Please call the care guide team at [phone] if you need to cancel or reschedule your appointment.   If you are experiencing a Mental Health or Behavioral Health Crisis or need someone to talk to, please call the Suicide and Crisis Lifeline: 988 call the [place]  National Suicide Prevention Lifeline: 818-617-6496 or TTY: (216)426-8713 TTY 716-598-5780) to talk to a trained counselor call 1-800-273-TALK (toll free, 24 hour hotline) call 911   Care plan and visit instructions communicated with the patient verbally today. Patient agrees to receive a copy in MyChart. Active MyChart status and patient understanding of how to access instructions and care plan via MyChart confirmed with patient.     Alan Ee, RN, BSN, CEN Population Health- Transition of Care Team.  [facility] [phone]

## 2024-10-19 NOTE — Transitions of Care (Post Inpatient/ED Visit) (Signed)
 10/19/2024  Patient ID: Ryan Gilmore, male   DOB: 03-10-1933, 89 y.o.   MRN: [id number]  Daughter called back and states CBG of 401 currently and it is lunch time.  I encouraged daughter to get patient to drink more water and eat a very low carb lunch. Suggested boiled eggs and salad. Encouraged daughter to continue to monitor CBG and call MD office if readings remain high. Daughter has scheduled office visit for tomorrow.   Alan Ee, RN, BSN, CEN [employer]- Transition of Care Team.  [facility] [phone]

## 2024-10-19 NOTE — Telephone Encounter (Signed)
 Called Pt to confirm. Sent an update to provider

## 2024-10-19 NOTE — Telephone Encounter (Signed)
 Pharmacy had to order ointment in hopes of being in by [employer] and can start applying after soaks. In the meantime, is there anything they need to do/use.

## 2024-10-20 ENCOUNTER — Ambulatory Visit: Admitting: Family Medicine

## 2024-10-20 ENCOUNTER — Encounter: Payer: Self-pay | Admitting: Family Medicine

## 2024-10-20 VITALS — BP 100/59 | HR 76 | Temp 97.8°F | Ht 71.0 in | Wt 164.2 lb

## 2024-10-20 DIAGNOSIS — N179 Acute kidney failure, unspecified: Secondary | ICD-10-CM

## 2024-10-20 DIAGNOSIS — K5900 Constipation, unspecified: Secondary | ICD-10-CM | POA: Diagnosis not present

## 2024-10-20 DIAGNOSIS — N390 Urinary tract infection, site not specified: Secondary | ICD-10-CM | POA: Diagnosis not present

## 2024-10-20 DIAGNOSIS — L8991 Pressure ulcer of unspecified site, stage 1: Secondary | ICD-10-CM | POA: Diagnosis not present

## 2024-10-20 DIAGNOSIS — E1165 Type 2 diabetes mellitus with hyperglycemia: Secondary | ICD-10-CM | POA: Diagnosis not present

## 2024-10-20 DIAGNOSIS — Z7984 Long term (current) use of oral hypoglycemic drugs: Secondary | ICD-10-CM

## 2024-10-20 LAB — GLUCOSE 16585: Glucose: 331 mg/dL — ABNORMAL HIGH (ref 65–99)

## 2024-10-20 NOTE — Progress Notes (Signed)
 "  Acute Office Visit  Patient ID: Ryan Gilmore, male    DOB: [birth year], 89 y.o.   MRN: [id number]  PCP: Duanne Butler DASEN, MD  Chief Complaint  Patient presents with   Hospitalization Follow-up    ED discharge 01/04 for Hyperglycemia  Seeing high blood sugar readings since release. Not eating well. Was not having bowel movements but had one last night and today.      Subjective:     HPI  Ryan Gilmore is here today for hospital follow-up. He was hospitalized from 10/01/2024-10/06/2024 for a fall in setting of generalized weakness. Was found to have Covid-19 as well as a UTI with severe AKI. Subsequent ED visit on 10/09/2024 for hyperglycemia and decreased urinary output from foley. BG was 600 with EMS. Bladder retaining 900 mL urine with blood clots resolved with bladder irrigation.  - Needs outpatient follow up with urology due to being DC with foley in place due to urinary retention. Scheduled with [facility] - Discharged with home PT/OT. - Needs diabetes control, glipizide  was discontinued due to risk of hypoglycemia in elderly - Stage 2 pressure injury to coccyx. DC with silicone foam. - Losartan  held in setting of AKI   Discharge Summary 10/06/2024 for reference only: Hospital Course: 89yo with h/o vascular dementia, T2DM, HTN, 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  CBG elevated to 470, +URI symptoms.  Noted to be positive for COVID-19.  Also with E. Faecalis UTI, started on Ceftriaxone  -> Ampicillin .  +AKI on presentation.   Assessment and Plan:   Assessment & Plan UTI (urinary tract infection) Pt presented with a fall in the setting of generalized weakness Urinalysis abnormal on presentation Urine culture positive for Enterococcus fecalis Treated with Ceftriaxone  -> Ampicillin  500mg  BID for 5 days AKI (acute kidney injury) Acute urinary retention Acute bump in serum creatinine, from 1.0 to 2.4 to 4.16 to 5.12 Started IV  hydration Holding losartan , oral hypoglycemic agents Bladder scan showed acute urinary retention, >1L urine retained Foley placed with 1200cc immediate urine return Starting tamsulosin  Will need dc with foley in place and outpatient f/u with urology Marked improvement in creatinine today, appropriate for dc with foley Severe vascular dementia (HCC) Patient has been sleeping and lethargic, although family reports that he is generally at baseline At baseline oriented to self and person (can identify spouse but not daughter), not oriented to place, time and situation Delirium precautions Patient has a pattern of awakening, eating and taking naps regularly at home COVID-19 virus infection Patient remains on room air, he is not in any respiratory distress His chest x-ray shows no evidence of pneumonia No indication for steroids or antivirals at this time Continue droplet and contact precautions Fall at home, initial encounter Generalized weakness Had a mild fall while putting on pants in the setting of weakness Likely secondary urinary tract infection and COVID infection PT/OT consulted Recommended for home PT/OT Mixed hyperlipidemia Continue rosuvastatin   Lymphoma (HCC) [facility] with Hx of [facility] WBC of 25 seems to be around his recent baseline Daughter states his WBC count is chronically elevated Diabetes mellitus type 2, noninsulin dependent (HCC) Last A1c 10.0% 6 months ago, poor control Resume PO hypoglycemic agents (glipizide , Jardiance, metformin , pioglitazone )  Consider outpatient addition of insulin  glargine (and stopping glipizide  due to risk of hypoglycemia in the elderly) Essential hypertension Started on amlodipine  Hold losartan  in the setting of AKI Pressure  injury Stage 2 Pressure Injury coccyx and medial buttocks pink dry, POA Also with thick hyperkeratotic skin to R heel, POA Wound care consulted Cleanse buttocks/sacrum/coccyx  with soap and water, apply silicone foam to sacrococcygeal area.  Lift foam daily to assess area, change foam q3 days and prn soiling. Cleanse B feet with soap and water, dry and apply Eucerin cream 2 times daily. Free float heels using pillows to offload pressure.  Discussed the use of AI scribe software for clinical note transcription with the patient, who gave verbal consent to proceed.  History of Present Illness ARTHER Gilmore is an 89 year old male who presents for follow-up after recent hospitalizations for COVID-19, UTI, and kidney injury.  He was hospitalized from December 27th to January 1st due to weakness, a fall, COVID-19, a urinary tract infection, and a kidney injury. During this hospitalization, he had a full bladder that was not draining, leading to the placement of a Foley catheter. He was discharged with the catheter and was seen by Urology, failed trial of void and has a subsequent follow-up appointment with urology on February 3rd.  He has a history of diabetes, which has been difficult to manage recently. During his hospital stay, his glipizide  was stopped, and he was given insulin . However, upon returning home, he resumed his oral diabetes medications, including glipizide , Actos  (pioglitazone ), metformin , and Jardiance. His blood sugars have been elevated, with recent readings of 289, 310, 298, 326, and 440 mg/dL, though it was 813 mg/dL this morning. He has been experiencing decreased appetite, eating only one meal a day, and has had a recent bowel movement after 14 days of constipation, managed with Miralax . Family is reluctant to start insulin . He is scheduled with Endocrinology in 3 days.  He has developed ulcers on his heels and buttocks, which are being managed with soaking, cream application, and wound care at the [facility]. His family assists with keeping the areas clean and dry to prevent further deterioration.  His blood pressure management has changed recently;  losartan  was stopped and replaced with amlodipine , which he takes at half a tablet. His blood pressure is not regularly monitored at home.  He has a history of prostate cancer and a type of lymphoma, which contributes to a consistently high white blood cell count. His recent hospitalization included a course of antibiotics for three days, which he completed. He is sleeping a lot and feels tired. He has not been on any new medications except for a short course of antibiotics and Flomax .   Review of Systems  All other systems reviewed and are negative.   Past Medical History:  Diagnosis Date   Bleeding duodenal ulcer    ?  1980s   Dementia (HCC)    Diabetes mellitus without complication (HCC)    Fall 09/30/2024   Hyperlipidemia     Past Surgical History:  Procedure Laterality Date   APPENDECTOMY      Outpatient Medications Prior to Visit  Medication Sig Dispense Refill   acetaminophen  (TYLENOL ) 325 MG tablet Take 2 tablets (650 mg total) by mouth every 6 (six) hours as needed for mild pain (pain score 1-3) or fever (or Fever >/= 101).     amLODipine  (NORVASC ) 10 MG tablet Take 0.5 tablets (5 mg total) by mouth daily. 30 tablet 0   aspirin  EC 81 MG tablet Take 81 mg by mouth daily. Swallow whole.     Blood Glucose Monitoring Suppl (BLOOD GLUCOSE SYSTEM PAK) KIT Please dispense  based on patient and insurance preference. Use as directed to monitor FSBS 2x. Dx: E11.E11.65. 1 kit 1   empagliflozin (JARDIANCE) 25 MG TABS tablet Take 25 mg by mouth daily.     glipiZIDE  (GLUCOTROL ) 5 MG tablet Take by mouth 2 (two) times daily before a meal.     Glucose Blood (BLOOD GLUCOSE TEST STRIPS) STRP Please dispense one touch verio flex lancets, and testing strips. Use as directed to monitor FSBS 2x. Dx: Z88.Z88.34. 100 strip 11   hydrocerin (EUCERIN) CREA Apply 1 Application topically 2 (two) times daily. Apply to feet/heels twice daily; do not place in between toes 240 g 0   Lancets MISC Please  dispense based on patient and insurance preference. Use as directed to monitor FSBS 2x. Dx: E11.E11.65. 100 each 3   liver oil-zinc  oxide (DESITIN) 40 % ointment Apply topically 2 (two) times daily. Apply to buttocks twice daily and as needed for soiling 56.7 g 0   metFORMIN  (GLUCOPHAGE ) 500 MG tablet TAKE 2 TABLETS BY MOUTH TWICE DAILY WITH A MEAL 360 tablet 1   Multiple Vitamin (MULTIVITAMIN) tablet Take 1 tablet by mouth daily.     mupirocin  ointment (BACTROBAN ) 2 % Apply 1 Application topically 2 (two) times daily. Apply1 gram to wound. 180 g 0   pioglitazone  (ACTOS ) 30 MG tablet Take 1 tablet (30 mg total) by mouth daily. 60 tablet 0   polyethylene glycol (MIRALAX ) 17 g packet Take 17 g by mouth daily. 14 each 0   psyllium (METAMUCIL SMOOTH TEXTURE) 58.6 % powder Take 1 packet by mouth 3 (three) times daily. 283 g 12   rosuvastatin  (CRESTOR ) 40 MG tablet Take 40 mg by mouth daily.     senna-docusate (SENOKOT-S) 8.6-50 MG tablet Take 1 tablet by mouth daily. 30 tablet 0   tamsulosin  (FLOMAX ) 0.4 MG CAPS capsule Take 1 capsule (0.4 mg total) by mouth daily after supper. 30 capsule 0   No facility-administered medications prior to visit.    Allergies[1]     Objective:    BP (!) 100/59   Pulse 76   Temp 97.8 F (36.6 C)   Ht 5' 11 (1.803 m)   Wt 164 lb 3.2 oz (74.5 kg)   SpO2 98%   BMI 22.90 kg/m  BP Readings from Last 3 Encounters:  10/20/24 (!) 100/59  10/09/24 128/65  10/06/24 (!) 102/52   Wt Readings from Last 3 Encounters:  10/20/24 164 lb 3.2 oz (74.5 kg)  10/09/24 169 lb 12.1 oz (77 kg)  10/02/24 169 lb 15.6 oz (77.1 kg)      Physical Exam Vitals and nursing note reviewed.  Constitutional:      Appearance: Normal appearance. He is normal weight.  HENT:     Head: Normocephalic and atraumatic.  Cardiovascular:     Rate and Rhythm: Normal rate and regular rhythm.     Pulses: Normal pulses.     Heart sounds: Normal heart sounds.  Pulmonary:     Effort:  Pulmonary effort is normal.     Breath sounds: Normal breath sounds.  Skin:    General: Skin is warm and dry.     Capillary Refill: Capillary refill takes less than 2 seconds.         Comments: Well defined beefy pink tissue to heel, photo provided by daughter. Maceration with scattered stage 1 pressure injuries to medial buttock, largest 1cm in diameter.  Neurological:     General: No focal deficit present.     Mental  Status: He is alert and oriented to person, place, and time. Mental status is at baseline.  Psychiatric:        Mood and Affect: Mood normal.        Behavior: Behavior normal.        Thought Content: Thought content normal.        Judgment: Judgment normal.       Results for orders placed or performed in visit on 10/20/24  GLUCOSE 83414  Result Value Ref Range   Glucose 331 (H) 65 - 99 mg/dL       Assessment & Plan:   Problem List Items Addressed This Visit       Endocrine   Hyperglycemia due to diabetes mellitus (HCC)   Relevant Orders   CBC with Differential/Platelet   Comprehensive metabolic panel with GFR   Glucose, fingerstick (stat)   Type 2 diabetes mellitus with hyperglycemia (HCC) - Primary   Relevant Orders   CBC with Differential/Platelet   Comprehensive metabolic panel with GFR   Glucose, fingerstick (stat)     Genitourinary   AKI (acute kidney injury)   Relevant Orders   CBC with Differential/Platelet   Comprehensive metabolic panel with GFR   Glucose, fingerstick (stat)     Other   Healing pressure injury, stage 1   Constipation    Assessment and Plan Assessment & Plan Type 2 diabetes mellitus with hyperglycemia Persistent hyperglycemia with recent levels 189-440 mg/dL. Recent hospitalization for hyperglycemia and infection. Discussed potential insulin  therapy and hypoglycemia risks. - Continue glipizide , Actos , metformin , Jardiance. - Monitor blood glucose closely. - Consult endocrinologist for potential insulin  therapy as  family is reluctant today. - Encourage nutritional intake, including protein shakes if necessary.  Pressure ulcers of heel and buttocks Heel ulcer improving with current care. Buttock ulcers need attention to prevent deterioration. - Continue heel ulcer wound care with podiatry. - Apply thick layer of zinc  paste to buttock ulcers daily and PRN. - Ensure regular repositioning to relieve pressure using pillows. - Consider Geomat or alternating pillows for pressure relief.  Urinary retention with chronic Foley catheter Managed with Foley catheter. Recent hospitalization for voiding issues. Adequate urine output, no obstruction. - Continue Foley catheter until February 3rd. - Monitor urine output and empty catheter bag regularly. - Follow up with urology on February 3rd.  Chronic kidney disease Recent kidney injury during hospitalization. Improving kidney function. - CMP and CBC today - Losartan  DC during hospitalization and Amlodipine  2.5mg  started. DC Amlodipine  today.  Constipation Recent bowel movement after 14 days. Managed with Miralax . - Continue Miralax . - Monitor bowel movements and adjust treatment as necessary.    No orders of the defined types were placed in this encounter.   No follow-ups on file.  Jeoffrey GORMAN Barrio, FNP [facility]       [1] No Known Allergies  "

## 2024-10-20 NOTE — Telephone Encounter (Signed)
 If no bm in 14 days, that is serious, I would start miralax  bid and use a fleets enema because he is likely impacted.

## 2024-10-21 ENCOUNTER — Other Ambulatory Visit: Payer: Self-pay

## 2024-10-21 LAB — COMPREHENSIVE METABOLIC PANEL WITH GFR
AG Ratio: 1.1 (calc) (ref 1.0–2.5)
ALT: 12 U/L (ref 9–46)
AST: 12 U/L (ref 10–35)
Albumin: 4 g/dL (ref 3.6–5.1)
Alkaline phosphatase (APISO): 81 U/L (ref 35–144)
BUN/Creatinine Ratio: 13 (calc) (ref 6–22)
BUN: 17 mg/dL (ref 7–25)
CO2: 26 mmol/L (ref 20–32)
Calcium: 9.5 mg/dL (ref 8.6–10.3)
Chloride: 97 mmol/L — ABNORMAL LOW (ref 98–110)
Creat: 1.3 mg/dL — ABNORMAL HIGH (ref 0.70–1.22)
Globulin: 3.5 g/dL (ref 1.9–3.7)
Glucose, Bld: 330 mg/dL — ABNORMAL HIGH (ref 65–99)
Potassium: 4.6 mmol/L (ref 3.5–5.3)
Sodium: 138 mmol/L (ref 135–146)
Total Bilirubin: 0.5 mg/dL (ref 0.2–1.2)
Total Protein: 7.5 g/dL (ref 6.1–8.1)
eGFR: 53 mL/min/1.73m2 — ABNORMAL LOW

## 2024-10-21 LAB — CBC WITH DIFFERENTIAL/PLATELET
Absolute Lymphocytes: 16165 {cells}/uL — ABNORMAL HIGH (ref 850–3900)
Absolute Monocytes: 1007 {cells}/uL — ABNORMAL HIGH (ref 200–950)
Basophils Absolute: 92 {cells}/uL (ref 0–200)
Basophils Relative: 0.3 %
Eosinophils Absolute: 183 {cells}/uL (ref 15–500)
Eosinophils Relative: 0.6 %
HCT: 42.1 % (ref 39.4–51.1)
Hemoglobin: 13.7 g/dL (ref 13.2–17.1)
MCH: 29 pg (ref 27.0–33.0)
MCHC: 32.5 g/dL (ref 31.6–35.4)
MCV: 89 fL (ref 81.4–101.7)
MPV: 11.2 fL (ref 7.5–12.5)
Monocytes Relative: 3.3 %
Neutro Abs: 13054 {cells}/uL — ABNORMAL HIGH (ref 1500–7800)
Neutrophils Relative %: 42.8 %
Platelets: 425 Thousand/uL — ABNORMAL HIGH (ref 140–400)
RBC: 4.73 Million/uL (ref 4.20–5.80)
RDW: 12.4 % (ref 11.0–15.0)
Total Lymphocyte: 53 %
WBC: 30.5 Thousand/uL — ABNORMAL HIGH (ref 3.8–10.8)

## 2024-10-21 NOTE — Patient Instructions (Signed)
 Visit Information  Thank you for taking time to visit with me today. Please don't hesitate to contact me if I can be of assistance to you before our next scheduled telephone appointment.  Following are the goals we discussed today:   Goals Addressed             This Visit's Progress    [facility] Transitions of Care (TOC) Care Plan       Problems:  Recent Hospitalization for treatment of UTI (urinary tract infection)-10/19/2024  daughter states that patient failed voiding trial and still has catheter.  Next voiding trial on 11/08/2024 Knowledge Deficit Related to diabetes management, diet and No Hospital Follow Up Provider appointment spouse declines for RN CM to schedule post hospital follow up appointment, states her daughter handles all appointments and everything  daughter is at work, unable to speak with her today  10/19/2024   Spoke with daughter today and she states that both parents have dementia.  Reports that she is preparing to move in with her parents to take care of them.    Daughter reports high CBG today, patient not eating well, no BM for 14 days. Sleeps a lot, 3 pressure ulcers, difficulty to get patient to take his medications- reports that he holds meds in his mouth. 10/21/2024   daughter reports patient continues to have difficulty holding his pills in his mouth,  Daughter is inspecting mouth after giving medications.   Per spouse, home health has seen pt, is providing oversight for wound care as well as daughter is changing dressing to buttock/ sacral area as needed  10/19/2024  PT coming today. Nurse coming tomorrow. 10/21/2024  seen by home health today.   Pt has dementia, per spouse, varies day to day, states  some days he knows me, some days he doesn't Spouse reports CBG is monitored and my daughter keeps up with it, I don't know any readings  Spouse reports pt typically eats 2 packs of instant oatmeal, 1 slice of bread and sausage for breakfast,   10/19/2024  fasting CBG today of  326. Daughter reports that she did not give patient his medications last night because patient did not eat dinner.  Patient ate breakfast this am ( oatmeal and a bananna) and patient did not take medications and patient is back asleep. 10/21/2024  fasting CBG of 238 today.  Daughter to discuss CBG readings with endocrinology .   Goal:  Over the next 30 days, the patient will not experience hospital readmission  Interventions:  Discussed plans with daughter for ongoing care management follow up and provided patient with direct contact information for care management team Reviewed medications with patient and discussed importance of taking as prescribed- reviewed each medication with daughter and updated EMR.  Assess CBG today and fasting is 238  Reviewed trends of A1c with daughter and concerns for high A1c.  Daughter reports that patient goes to the [place] for DM treatment. Reviewed carbohydrate modified diet, importance of good CBG control for wound healing Reviewed signs/ symptoms of infection, UTI- discussed voiding trial and patient did not pass. Continues to have foley.  Daughter reports blood at insertion site of the penis.  No blood in foley bag.   Reviewed care of foley catheter, using aseptic technique for emptying bag, cleaning around insertion site Reviewed that home health is a resource for issues with foley catheter, wound, with nurse on call if needed Reviewed wound care orders from discharge summary- Free float heels to offload with  pillows, wash feet daily and dry, apply Eucerin, Buttock/ sacrum/ coccyx area- apply silicone dressing q 3 days, observe daily by lifting edges, use barrier cream and keep skin clean and dry Spoke with Manny home health nurse today and he will follow up with MD regarding necrotic heels and need for different dressing changes.  Reviewed office visit at [facility] this week for blisters. 10/21/2024  blisters are now open per home health nurse.  Reviewed  concerns for elevated CBG and patient not taking his medications well. Daughter states she is finding that patient is not swallowing his pills. Suggested to give 1 pill at a time and then inspect mouth to make sure pill was swallowed.   Reviewed when CBG is high that it predisposes patient to a UTI.  Reviewed importance of following DM diet. Encouraged daughter to consult her pharmacist if she chooses to crush medications vs giving whole.  Reviewed importance of having a BM.  Had BM on 10/20/2024 Reviewed importance of skin care- due to 3 pressure areas ( in the crack ) per daughter the size of a penny.  Reviewed the importance of keeping skin clean and dry. Reviewed importance of keeping pressure off bottom.  Encouraged patient to ambulate.  According to daughter- patient  only want to sit.  Confirmed patient is active with PT and therapist is coming today.  Encouraged daughter to get patient to get up and walk to the bathroom every 2 hours to get some exercise.  Encouraged daughter to call MD for changes in condition or to call homehealth who is available 24/7.  Patient Self Care Activities:  Attend all scheduled provider appointments Call pharmacy for medication refills 3-7 days in advance of running out of medications Call provider office for new concerns or questions  Notify RN Care Manager of TOC call rescheduling needs Participate in Transition of Care Program/Attend TOC scheduled calls Take medications as prescribed   Report any signs of infection, UTI to your doctor Drink adequate fluids, preferably water Include adequate protein at each meal to help with wound healing Be careful of how many carbohydrates you consume at each meal, decrease instant oatmeal as it has a lot of sugar Change positions frequently, keep bottom clean and dry. Take all medications as prescribed. Take Miralx.  Call MD for continued high CBG reading Talk to endocrinology about DM control    Plan:  Telephone  follow up appointment with care management team member scheduled for:   10/26/2024 @ 10 am with Alan Ee RN The patient has been provided with contact information for the care management team and has been advised to call with any health related questions or concerns.          Our next appointment is by telephone on 10/26/2024 at 10  Please call the care guide team at [phone] if you need to cancel or reschedule your appointment.   If you are experiencing a Mental Health or Behavioral Health Crisis or need someone to talk to, please call the Suicide and Crisis Lifeline: 988 call the [place]  National Suicide Prevention Lifeline: 906-867-7985 or TTY: 318-276-7007 TTY 860-882-3689) to talk to a trained counselor call 1-800-273-TALK (toll free, 24 hour hotline) call 911   Care plan and visit instructions communicated with the patient verbally today. Patient agrees to receive a copy in MyChart. Active MyChart status and patient understanding of how to access instructions and care plan via MyChart confirmed with patient.     Alan Ee, RN, BSN,  CEN Population Health- Transition of Care Team.  [facility] [phone]

## 2024-10-21 NOTE — Transitions of Care (Post Inpatient/ED Visit) (Signed)
 " Transition of Care week 2  Visit Note  10/21/2024  Name: Ryan Gilmore MRN: [id number]          DOB: 1933-03-09  Situation: Patient enrolled in [facility] 30-day program. Visit completed with daughter, Cam by telephone.   Background:   Initial Transition Care Management Follow-up Telephone Call Discharge Date and Diagnosis: 10/06/24, UTI (urinary tract infection)   Past Medical History:  Diagnosis Date   Bleeding duodenal ulcer    ?  1980s   Dementia (HCC)    Diabetes mellitus without complication (HCC)    Fall 09/30/2024   Hyperlipidemia     Assessment: Patient was seen at PCP office yesterday.  CBG slightly better and he has a had a BM.  Home health nurse came today.  Patient Reported Symptoms: Cognitive Cognitive Status: Other: (phone call with daughter only)      Neurological Neurological Review of Symptoms: Other: [place] Neurological Symptoms/Conditions [RPT]: dementia    HEENT HEENT Symptoms Reported: No symptoms reported      Cardiovascular Cardiovascular Symptoms Reported: No symptoms reported    Respiratory Respiratory Symptoms Reported: No symptoms reported    Endocrine Endocrine Symptoms Reported: No symptoms reported Is patient diabetic?: Yes Is patient checking blood sugars at home?: Yes List most recent blood sugar readings, include date and time of day: Fasting CBG today of 238 Endocrine Self-Management Outcome: 3 (uncertain) Endocrine Comment: Home health nurse today at home. Continues to have elevated CBG.  Daughter is trying to get medications in patient.  Gastrointestinal Gastrointestinal Symptoms Reported: Constipation Additional Gastrointestinal Details: BM on 10/20/2024  after taking miralax .      Genitourinary Genitourinary Symptoms Reported: Other Other Genitourinary Symptoms: continues to have a foley catheter.  no concerns with foley at this time    Integumentary Integumentary Symptoms Reported: Wound Additional Integumentary Details: per home  health nurse. heels are necrotic and blisters have opened. Homehealth nurse to contact [facility] to a change in dressing. Paitent has an appointment at [facility] in 4 days. Skin Management Strategies: Medication therapy, Dressing changes, Routine screening Skin Comment: encouraged frequent moving to relieve pressure.  Musculoskeletal Musculoskelatal Symptoms Reviewed: Limited mobility Additional Musculoskeletal Details: patient does not want to get up and walk, He wants to sit on the couch or chair. Musculoskeletal Management Strategies: Routine screening, Exercise Musculoskeletal Self-Management Outcome: 3 (uncertain)      Psychosocial Psychosocial Symptoms Reported: Not assessed         There were no vitals filed for this visit. Pain Scale: 0-10 Pain Score: 0-No pain  Medications Reviewed Today     Reviewed by Rumalda Alan PENNER, RN (Registered Nurse) on 10/21/24 at 1102  Med List Status: <None>   Medication Order Taking? Sig Documenting Provider Last Dose Status Informant  acetaminophen  (TYLENOL ) 325 MG tablet [id number] Yes Take 2 tablets (650 mg total) by mouth every 6 (six) hours as needed for mild pain (pain score 1-3) or fever (or Fever >/= 101). Barbarann Nest, MD  Active Family Member, Pharmacy Records  amLODipine  (NORVASC ) 10 MG tablet [id number] Yes Take 0.5 tablets (5 mg total) by mouth daily. Barbarann Nest, MD  Active Family Member, Pharmacy Records  aspirin  EC 81 MG tablet [id number] Yes Take 81 mg by mouth daily. Swallow whole. [provider]  Active Family Member, Pharmacy Records  Blood Glucose Monitoring Suppl (BLOOD GLUCOSE SYSTEM PAK) KIT [id number] Yes Please dispense based on patient and insurance preference. Use as directed to monitor FSBS 2x. Dx: E11.E11.65. Pickard,  Butler DASEN, MD  Active Family Member, Pharmacy Records  empagliflozin (JARDIANCE) 25 MG TABS tablet [id number] Yes Take 25 mg by mouth daily. [provider]  Active Family  Member, Pharmacy Records  glipiZIDE  (GLUCOTROL ) 5 MG tablet [id number] Yes Take by mouth 2 (two) times daily before a meal. [provider]  Active Family Member, Pharmacy Records  Glucose Blood (BLOOD GLUCOSE TEST STRIPS) STRP [id number] Yes Please dispense one touch verio flex lancets, and testing strips. Use as directed to monitor FSBS 2x. Dx: E11.E11.65. Duanne Butler DASEN, MD  Active Family Member, Pharmacy Records  hydrocerin [facility]) CREA [id number] Yes Apply 1 Application topically 2 (two) times daily. Apply to feet/heels twice daily; do not place in between toes Barbarann Nest, MD  Active Family Member, Pharmacy Records  Lancets MISC [id number] Yes Please dispense based on patient and insurance preference. Use as directed to monitor FSBS 2x. Dx: E11.E11.65. Duanne Butler DASEN, MD  Active Family Member, Pharmacy Records  liver oil-zinc  oxide (DESITIN) 40 % ointment [id number] Yes Apply topically 2 (two) times daily. Apply to buttocks twice daily and as needed for soiling Barbarann Nest, MD  Active Family Member, Pharmacy Records  metFORMIN  (GLUCOPHAGE ) 500 MG tablet [id number] Yes TAKE 2 TABLETS BY MOUTH TWICE DAILY WITH A MEAL Pickard, Butler DASEN, MD  Active Family Member, Pharmacy Records  Multiple Vitamin (MULTIVITAMIN) tablet [id number] Yes Take 1 tablet by mouth daily. [provider]  Active Family Member, Pharmacy Records  mupirocin  ointment (BACTROBAN ) 2 % [id number] Yes Apply 1 Application topically 2 (two) times daily. Apply1 gram to wound. Christine Rush, DPM  Active   pioglitazone  (ACTOS ) 30 MG tablet [id number] Yes Take 1 tablet (30 mg total) by mouth daily. Duanne Butler DASEN, MD  Active Family Member, Pharmacy Records  polyethylene glycol (MIRALAX ) 17 g packet [id number] Yes Take 17 g by mouth daily. Kingsley, Victoria K, DO  Active   psyllium (METAMUCIL SMOOTH TEXTURE) 58.6 % powder [id number] Yes Take 1 packet by mouth 3 (three) times daily. Kingsley, Victoria K, DO  Active    rosuvastatin  (CRESTOR ) 40 MG tablet [id number] Yes Take 40 mg by mouth daily. [provider]  Active Family Member, Pharmacy Records  senna-docusate (SENOKOT-S) 8.6-50 MG tablet [id number] Yes Take 1 tablet by mouth daily. Kingsley, Victoria K, DO  Active   tamsulosin  (FLOMAX ) 0.4 MG CAPS capsule [id number] Yes Take 1 capsule (0.4 mg total) by mouth daily after supper. Barbarann Nest, MD  Active Family Member, Pharmacy Records            Goals Addressed             This Visit's Progress    [facility] Transitions of Care [facility]) Care Plan       Problems:  Recent Hospitalization for treatment of UTI (urinary tract infection)-10/19/2024  daughter states that patient failed voiding trial and still has catheter.  Next voiding trial on 11/08/2024 Knowledge Deficit Related to diabetes management, diet and No Hospital Follow Up Provider appointment spouse declines for RN CM to schedule post hospital follow up appointment, states her daughter handles all appointments and everything  daughter is at work, unable to speak with her today  10/19/2024   Spoke with daughter today and she states that both parents have dementia.  Reports that she is preparing to move in with her parents to take care of them.    Daughter reports high CBG today, patient not eating well, no BM for 14 days. Sleeps a lot, 3 pressure ulcers,  difficulty to get patient to take his medications- reports that he holds meds in his mouth. 10/21/2024   daughter reports patient continues to have difficulty holding his pills in his mouth,  Daughter is inspecting mouth after giving medications.   Per spouse, home health has seen pt, is providing oversight for wound care as well as daughter is changing dressing to buttock/ sacral area as needed  10/19/2024  PT coming today. Nurse coming tomorrow. 10/21/2024  seen by home health today.   Pt has dementia, per spouse, varies day to day, states  some days he knows me, some days he doesn't Spouse  reports CBG is monitored and my daughter keeps up with it, I don't know any readings  Spouse reports pt typically eats 2 packs of instant oatmeal, 1 slice of bread and sausage for breakfast,   10/19/2024  fasting CBG today of 326. Daughter reports that she did not give patient his medications last night because patient did not eat dinner.  Patient ate breakfast this am ( oatmeal and a bananna) and patient did not take medications and patient is back asleep. 10/21/2024  fasting CBG of 238 today.  Daughter to discuss CBG readings with endocrinology .   Goal:  Over the next 30 days, the patient will not experience hospital readmission  Interventions:  Discussed plans with daughter for ongoing care management follow up and provided patient with direct contact information for care management team Reviewed medications with patient and discussed importance of taking as prescribed- reviewed each medication with daughter and updated EMR.  Assess CBG today and fasting is 238  Reviewed trends of A1c with daughter and concerns for high A1c.  Daughter reports that patient goes to the [place] for DM treatment. Reviewed carbohydrate modified diet, importance of good CBG control for wound healing Reviewed signs/ symptoms of infection, UTI- discussed voiding trial and patient did not pass. Continues to have foley.  Daughter reports blood at insertion site of the penis.  No blood in foley bag.   Reviewed care of foley catheter, using aseptic technique for emptying bag, cleaning around insertion site Reviewed that home health is a resource for issues with foley catheter, wound, with nurse on call if needed Reviewed wound care orders from discharge summary- Free float heels to offload with pillows, wash feet daily and dry, apply Eucerin, Buttock/ sacrum/ coccyx area- apply silicone dressing q 3 days, observe daily by lifting edges, use barrier cream and keep skin clean and dry Spoke with Manny home health nurse today and he  will follow up with MD regarding necrotic heels and need for different dressing changes.  Reviewed office visit at [facility] this week for blisters. 10/21/2024  blisters are now open per home health nurse.  Reviewed concerns for elevated CBG and patient not taking his medications well. Daughter states she is finding that patient is not swallowing his pills. Suggested to give 1 pill at a time and then inspect mouth to make sure pill was swallowed.   Reviewed when CBG is high that it predisposes patient to a UTI.  Reviewed importance of following DM diet. Encouraged daughter to consult her pharmacist if she chooses to crush medications vs giving whole.  Reviewed importance of having a BM.  Had BM on 10/20/2024 Reviewed importance of skin care- due to 3 pressure areas ( in the crack ) per daughter the size of a penny.  Reviewed the importance of keeping skin clean and dry. Reviewed importance of keeping pressure  off bottom.  Encouraged patient to ambulate.  According to daughter- patient  only want to sit.  Confirmed patient is active with PT and therapist is coming today.  Encouraged daughter to get patient to get up and walk to the bathroom every 2 hours to get some exercise.  Encouraged daughter to call MD for changes in condition or to call homehealth who is available 24/7.  Patient Self Care Activities:  Attend all scheduled provider appointments Call pharmacy for medication refills 3-7 days in advance of running out of medications Call provider office for new concerns or questions  Notify RN Care Manager of TOC call rescheduling needs Participate in Transition of Care Program/Attend TOC scheduled calls Take medications as prescribed   Report any signs of infection, UTI to your doctor Drink adequate fluids, preferably water Include adequate protein at each meal to help with wound healing Be careful of how many carbohydrates you consume at each meal, decrease instant oatmeal as it has a lot  of sugar Change positions frequently, keep bottom clean and dry. Take all medications as prescribed. Take Miralx.  Call MD for continued high CBG reading Talk to endocrinology about DM control    Plan:  Telephone follow up appointment with care management team member scheduled for:   10/26/2024 @ 10 am with Alan Ee RN The patient has been provided with contact information for the care management team and has been advised to call with any health related questions or concerns.          Recommendation:   Continue Current Plan of Care Call pharmacy if you chose to crush medications to get advice on which medications can  be crushed.   Follow Up Plan:   Telephone follow up appointment date/time:  10/26/2024  Alan Ee, RN, BSN, CEN Population Health- Transition of Care Team.  [facility] [phone]     "

## 2024-10-24 ENCOUNTER — Telehealth: Payer: Self-pay | Admitting: Lab

## 2024-10-24 NOTE — Telephone Encounter (Signed)
 Nurse calling to inform you patient feet look necrotic patient has appointment tomorrow in office and is requesting wound care orders and supplies becoming to much for wife to care for.

## 2024-10-25 ENCOUNTER — Ambulatory Visit: Payer: Self-pay | Admitting: Family Medicine

## 2024-10-25 ENCOUNTER — Ambulatory Visit: Admitting: Podiatry

## 2024-10-25 ENCOUNTER — Other Ambulatory Visit: Payer: Self-pay | Admitting: Family Medicine

## 2024-10-25 DIAGNOSIS — L97401 Non-pressure chronic ulcer of unspecified heel and midfoot limited to breakdown of skin: Secondary | ICD-10-CM | POA: Diagnosis not present

## 2024-10-25 DIAGNOSIS — D72820 Lymphocytosis (symptomatic): Secondary | ICD-10-CM

## 2024-10-25 NOTE — Progress Notes (Signed)
 Patient presents for follow-up ulcers heels bilaterally.  Type II diabetic.  They have been doing wound care as instructed.  He does have home health care so they would like some instructions for home health. Physical Exam:  Patient alert and oriented x 3.  No complaints of nausea, vomiting, fever, or chills  Vascular: DP pulses 2/4 bilateral. PT pulses 1/4 lateral.  Mild edema lower extremity. Capillary fill time immediate bilaterally.  Dermatologic: Superficial ulceration plantar posterior aspect heel left penetrating the dermis.  20 x 30 cm area of eschar and darkening of the dermis.  Some serous drainage.  No signs of infection.  Measures 60 mm wide x 40 mm long x 2 deep.   Superficial ulceration plantar posterior aspect heel right penetrating into the dermis.  There are drainage with no signs of infection.  Measures 20 mm wide x 20 mm long x 2 deep.   Neurologic:   Musculoskeletal:    Diagnoses: 1.  Superficial ulceration Wagner grade 1 heel bilaterally  Plan: - Discussed with patient and caregivers the ulcerations.  Appeared to stabilize well.  Will write instructions for home health care to care for the wounds. -Wound care: Clean the area with warm soapy water daily, apply Santyl ointment daily, and a dry sterile dressing. -Sharp debridement with tissue nippers superficial Wagner grade 1 ulceration Illes bilaterally.  Debrided any devitalized tissue with a tissue nipper.  Applied antibiotic ointment and a light dressing    Return 1 week f/u ulcers heel b/l

## 2024-10-25 NOTE — Patient Instructions (Signed)
 Place warm soapy tap water in basin.  Submerge your foot or feet in the solution and soak for 10-15 minutes.  This soak should be done daily. Next, remove your foot or feet from solution, blot dry the affected area. Apply Sentil ointment and cover with sterile dressing as instructed by your doctor.   IF YOUR SKIN BECOMES IRRITATED WHILE USING THESE INSTRUCTIONS, please inform provider. Monitor for any signs/symptoms of infection. Call the office immediately if any occur or go directly to the emergency room. Call with any questions/concerns.

## 2024-10-26 ENCOUNTER — Other Ambulatory Visit: Payer: Self-pay

## 2024-10-26 NOTE — Transitions of Care (Post Inpatient/ED Visit) (Signed)
 " Transition of Care week 2  Visit Note  10/26/2024  Name: Ryan Gilmore MRN: [id number]          DOB: 11-06-1932  Situation: Patient enrolled in [facility] 30-day program. Visit completed with daughter, Cam by telephone.   Background:   Initial Transition Care Management Follow-up Telephone Call Discharge Date and Diagnosis: 10/06/24, UTI (urinary tract infection)   Past Medical History:  Diagnosis Date   Bleeding duodenal ulcer    ?  1980s   Dementia (HCC)    Diabetes mellitus without complication (HCC)    Fall 09/30/2024   Hyperlipidemia     Assessment: Daughter reports follows up at the [facility] and at podiatry yesterday.  Daughter is able to crush medications and get patient to take with pudding or applesauce.   Patient Reported Symptoms: Cognitive Cognitive Status: Other: (phone call with daughter only. Daughter reports that dementia is worse and that is struggling to follow directions.  Took 10 minutes for patient to get out of car yesterday. When he is asked to do something he does not understand.)      Neurological Neurological Review of Symptoms: Other: [place] Neurological Symptoms/Conditions [RPT]: dementia Neurological Management Strategies: Routine screening Neurological Self-Management Outcome: 3 (uncertain)  HEENT HEENT Symptoms Reported: No symptoms reported      Cardiovascular Cardiovascular Symptoms Reported: No symptoms reported Does patient have uncontrolled Hypertension?: No Cardiovascular Self-Management Outcome: 3 (uncertain)  Respiratory Respiratory Symptoms Reported: No symptoms reported Respiratory Self-Management Outcome: 5 (very good)  Endocrine Endocrine Symptoms Reported: No symptoms reported Is patient diabetic?: Yes Is patient checking blood sugars at home?: Yes List most recent blood sugar readings, include date and time of day: CBG today of 217 Endocrine Self-Management Outcome: 4 (good) Endocrine Comment: daughter reports that she is doing better at  getting patients medications in  Gastrointestinal Gastrointestinal Symptoms Reported: Constipation Additional Gastrointestinal Details: no BM for 6 days. Reviewed importance of taking mirlax daily Gastrointestinal Self-Management Outcome: 3 (uncertain)    Genitourinary Genitourinary Symptoms Reported: Other Other Genitourinary Symptoms: contineus to have foley catheter- daughter does not think patient would be able to know when he has to void if catheter was removed.  Follow up with urology on 11/07/2024 Genitourinary Management Strategies: Catheter, indwelling  Integumentary Integumentary Symptoms Reported: Wound Additional Integumentary Details: continues to have wound between buttocks and on both heels. Wound care provided by family and home health Skin Management Strategies: Dressing changes, Medication therapy, Routine screening Skin Self-Management Outcome: 3 (uncertain)  Musculoskeletal Musculoskelatal Symptoms Reviewed: Limited mobility Additional Musculoskeletal Details: daughter is encouraged patinet to get up and move around more. Musculoskeletal Management Strategies: Routine screening Musculoskeletal Self-Management Outcome: 3 (uncertain)      Psychosocial Psychosocial Symptoms Reported: Not assessed         There were no vitals filed for this visit. Pain Scale: 0-10 (daughter denies pain) Pain Score: 0-No pain  Medications Reviewed Today     Reviewed by Rumalda Alan PENNER, RN (Registered Nurse) on 10/26/24 at 1042  Med List Status: <None>   Medication Order Taking? Sig Documenting Provider Last Dose Status Informant  acetaminophen  (TYLENOL ) 325 MG tablet [id number] Yes Take 2 tablets (650 mg total) by mouth every 6 (six) hours as needed for mild pain (pain score 1-3) or fever (or Fever >/= 101). Barbarann Nest, MD  Active Family Member, Pharmacy Records  amLODipine  (NORVASC ) 10 MG tablet [id number] Yes Take 0.5 tablets (5 mg total) by mouth daily. Barbarann Nest, MD  Active  Family Member, Pharmacy  Records  aspirin  EC 81 MG tablet [id number] Yes Take 81 mg by mouth daily. Swallow whole. [provider]  Active Family Member, Pharmacy Records  Blood Glucose Monitoring Suppl (BLOOD GLUCOSE SYSTEM PAK) KIT [id number] Yes Please dispense based on patient and insurance preference. Use as directed to monitor FSBS 2x. Dx: E11.E11.65. Duanne Butler DASEN, MD  Active Family Member, Pharmacy Records  empagliflozin (JARDIANCE) 25 MG TABS tablet [id number] Yes Take 25 mg by mouth daily. [provider]  Active Family Member, Pharmacy Records  glipiZIDE  (GLUCOTROL ) 5 MG tablet [id number] Yes Take by mouth 2 (two) times daily before a meal. [provider]  Active Family Member, Pharmacy Records  Glucose Blood (BLOOD GLUCOSE TEST STRIPS) STRP [id number] Yes Please dispense one touch verio flex lancets, and testing strips. Use as directed to monitor FSBS 2x. Dx: E11.E11.65. Duanne Butler DASEN, MD  Active Family Member, Pharmacy Records  hydrocerin [facility]) CREA [id number] Yes Apply 1 Application topically 2 (two) times daily. Apply to feet/heels twice daily; do not place in between toes Barbarann Nest, MD  Active Family Member, Pharmacy Records  Lancets MISC [id number] Yes Please dispense based on patient and insurance preference. Use as directed to monitor FSBS 2x. Dx: E11.E11.65. Duanne Butler DASEN, MD  Active Family Member, Pharmacy Records  liver oil-zinc  oxide (DESITIN) 40 % ointment [id number] Yes Apply topically 2 (two) times daily. Apply to buttocks twice daily and as needed for soiling Barbarann Nest, MD  Active Family Member, Pharmacy Records  metFORMIN  (GLUCOPHAGE ) 500 MG tablet [id number] Yes TAKE 2 TABLETS BY MOUTH TWICE DAILY WITH A MEAL Pickard, Butler DASEN, MD  Active Family Member, Pharmacy Records  Multiple Vitamin (MULTIVITAMIN) tablet [id number] Yes Take 1 tablet by mouth daily. [provider]  Active Family Member, Pharmacy Records  mupirocin   ointment (BACTROBAN ) 2 % [id number] Yes Apply 1 Application topically 2 (two) times daily. Apply1 gram to wound. Christine Rush, DPM  Active   pioglitazone  (ACTOS ) 30 MG tablet [id number] Yes Take 1 tablet (30 mg total) by mouth daily. Duanne Butler DASEN, MD  Active Family Member, Pharmacy Records  polyethylene glycol (MIRALAX ) 17 g packet [id number] Yes Take 17 g by mouth daily. Kingsley, Victoria K, DO  Active   psyllium (METAMUCIL SMOOTH TEXTURE) 58.6 % powder [id number] Yes Take 1 packet by mouth 3 (three) times daily. Kingsley, Victoria K, DO  Active   rosuvastatin  (CRESTOR ) 40 MG tablet [id number] Yes Take 40 mg by mouth daily. [provider]  Active Family Member, Pharmacy Records  senna-docusate (SENOKOT-S) 8.6-50 MG tablet [id number] Yes Take 1 tablet by mouth daily. Kingsley, Victoria K, DO  Active   tamsulosin  (FLOMAX ) 0.4 MG CAPS capsule [id number] Yes Take 1 capsule (0.4 mg total) by mouth daily after supper. Barbarann Nest, MD  Active Family Member, Pharmacy Records            Goals Addressed             This Visit's Progress    [facility] Transitions of Care [facility]) Care Plan       Problems:  Recent Hospitalization for treatment of UTI (urinary tract infection)-10/19/2024  daughter states that patient failed voiding trial and still has catheter.  Next voiding trial on 11/08/2024,  10/26/2024  no changes.  Knowledge Deficit Related to diabetes management, diet and No Hospital Follow Up Provider appointment spouse declines for RN CM to schedule post hospital follow up appointment, states her daughter handles all appointments and everything  daughter is at work, unable  to speak with her today  10/19/2024   Spoke with daughter today and she states that both parents have dementia.  Reports that she is preparing to move in with her parents to take care of them.    Daughter reports high CBG today, patient not eating well, no BM for 14 days. Sleeps a lot, 3 pressure ulcers, difficulty to get  patient to take his medications- reports that he holds meds in his mouth. 10/21/2024   daughter reports patient continues to have difficulty holding his pills in his mouth,  Daughter is inspecting mouth after giving medications.  10/26/2024  per daughter patient CBG are slowly improving with daughter being in charge of medications. Wound care per daughter and [facility] health nurse. [facility] which daughter is comfortable doing.  CBG today of 217 Per spouse, home health has seen pt, is providing oversight for wound care as well as daughter is changing dressing to buttock/ sacral area as needed  10/19/2024  PT coming today. Nurse coming tomorrow. 10/21/2024  seen by home health today.  10/26/2024  PT is seeing patient today. Daughter reports patient is move around more.  Pt has dementia, per spouse, varies day to day, states  some days he knows me, some days he doesn't Spouse reports CBG is monitored and my daughter keeps up with it, I don't know any readings  Spouse reports pt typically eats 2 packs of instant oatmeal, 1 slice of bread and sausage for breakfast,   10/19/2024  fasting CBG today of 326. Daughter reports that she did not give patient his medications last night because patient did not eat dinner.  Patient ate breakfast this am ( oatmeal and a bananna) and patient did not take medications and patient is back asleep. 10/21/2024  fasting CBG of 238 today.  Daughter to discuss CBG readings with endocrinology . 10/26/2024   [facility] yesterday. Foot care and dressing instructions provided to family and home health.   Daughter thinks the left foot is worse than the right.   Goal:  Over the next 30 days, the patient will not experience hospital readmission  Interventions:  Discussed plans with daughter for ongoing care management follow up and provided patient with direct contact information for care management team Reviewed  medications with patient and discussed importance of taking as prescribed- reviewed each medication with daughter and updated EMR.  Assess CBG today and fasting is 217  Reviewed trends of A1c with daughter and concerns for high A1c.  Daughter reports that patient goes to the [place] for DM treatment. Reviewed carbohydrate modified diet, importance of good CBG control for wound healing Reviewed signs/ symptoms of infection, UTI- discussed voiding trial and patient did not pass. Continues to have foley.  Daughter reports blood at insertion site of the penis.  No blood in foley bag.   Reviewed care of foley catheter, using aseptic technique for emptying bag, cleaning around insertion site Reviewed that home health is a resource for issues with foley catheter, wound, with nurse on call if needed Reviewed wound care orders from discharge summary- Free float heels to offload with pillows, wash feet daily and dry, apply Eucerin, Buttock/ sacrum/ coccyx area- apply silicone dressing q 3 days, observe daily by lifting edges, use barrier cream and keep skin clean and dry Reviewed that daughter is crushing meds and this was ok'd at the [facility] Reviewed importance of having a BM.  Had BM on  10/20/2024.  Encouraged daughter to give miralax  daily.  Reviewed importance of keeping pressure off bottom.  Encouraged patient to ambulate.  According to daughter- patient  only want to sit.  Confirmed patient is active with PT and therapist is coming today.  Encouraged daughter to get patient to get up and walk to the bathroom every 2 hours to get some exercise.  Encouraged daughter to call MD for changes in condition or to call homehealth who is available 24/7.  Patient Self Care Activities:  Attend all scheduled provider appointments Call pharmacy for medication refills 3-7 days in advance of running out of medications Call provider office for new concerns or questions  Notify RN Care Manager of TOC call rescheduling  needs Participate in Transition of Care Program/Attend TOC scheduled calls Take medications as prescribed   Report any signs of infection, UTI to your doctor Drink adequate fluids, preferably water Include adequate protein at each meal to help with wound healing Be careful of how many carbohydrates you consume at each meal, decrease instant oatmeal as it has a lot of sugar Change positions frequently, keep bottom clean and dry. Take all medications as prescribed. Take Miralx.  Call MD for continued high CBG reading Continue to observe for signs of UTI or infection of pressure ulcers.     Plan:  Telephone follow up appointment with care management team member scheduled for:   11/02/2024 @ 11 am with Alan Ee RN The patient has been provided with contact information for the care management team and has been advised to call with any health related questions or concerns.         Recommendation:   Continue Current Plan of Care  Follow Up Plan:   Telephone follow up appointment date/time:  11/02/2024 at 11 am  Alan Ee, RN, BSN, [employer]- Transition of Care Team.  [facility] [phone]     "

## 2024-10-26 NOTE — Patient Instructions (Signed)
 Visit Information  Thank you for taking time to visit with me today. Please don't hesitate to contact me if I can be of assistance to you before our next scheduled telephone appointment.  Following are the goals we discussed today:   Goals Addressed             This Visit's Progress    [facility] Transitions of Care (TOC) Care Plan       Problems:  Recent Hospitalization for treatment of UTI (urinary tract infection)-10/19/2024  daughter states that patient failed voiding trial and still has catheter.  Next voiding trial on 11/08/2024,  10/26/2024  no changes.  Knowledge Deficit Related to diabetes management, diet and No Hospital Follow Up Provider appointment spouse declines for RN CM to schedule post hospital follow up appointment, states her daughter handles all appointments and everything  daughter is at work, unable to speak with her today  10/19/2024   Spoke with daughter today and she states that both parents have dementia.  Reports that she is preparing to move in with her parents to take care of them.    Daughter reports high CBG today, patient not eating well, no BM for 14 days. Sleeps a lot, 3 pressure ulcers, difficulty to get patient to take his medications- reports that he holds meds in his mouth. 10/21/2024   daughter reports patient continues to have difficulty holding his pills in his mouth,  Daughter is inspecting mouth after giving medications.  10/26/2024  per daughter patient CBG are slowly improving with daughter being in charge of medications. Wound care per daughter and home health nurse. [facility] and [facility] to [facility] which daughter is comfortable doing.  CBG today of 217 Per spouse, home health has seen pt, is providing oversight for wound care as well as daughter is changing dressing to buttock/ sacral area as needed  10/19/2024  PT coming today. Nurse coming tomorrow. 10/21/2024  seen by home health today.  10/26/2024  PT is seeing patient today. Daughter reports  patient is move around more.  Pt has dementia, per spouse, varies day to day, states  some days he knows me, some days he doesn't Spouse reports CBG is monitored and my daughter keeps up with it, I don't know any readings  Spouse reports pt typically eats 2 packs of instant oatmeal, 1 slice of bread and sausage for breakfast,   10/19/2024  fasting CBG today of 326. Daughter reports that she did not give patient his medications last night because patient did not eat dinner.  Patient ate breakfast this am ( oatmeal and a bananna) and patient did not take medications and patient is back asleep. 10/21/2024  fasting CBG of 238 today.  Daughter to discuss CBG readings with endocrinology . 10/26/2024   [facility] Saw podiatry yesterday. Foot care and dressing instructions provided to family and home health.   Daughter thinks the left foot is worse than the right.   Goal:  Over the next 30 days, the patient will not experience hospital readmission  Interventions:  Discussed plans with daughter for ongoing care management follow up and provided patient with direct contact information for care management team Reviewed medications with patient and discussed importance of taking as prescribed- reviewed each medication with daughter and updated EMR.  Assess CBG today and fasting is 217  Reviewed trends of A1c with daughter and concerns for high A1c.  Daughter reports that patient goes to the [place] for DM treatment.  Reviewed carbohydrate modified diet, importance of good CBG control for wound healing Reviewed signs/ symptoms of infection, UTI- discussed voiding trial and patient did not pass. Continues to have foley.  Daughter reports blood at insertion site of the penis.  No blood in foley bag.   Reviewed care of foley catheter, using aseptic technique for emptying bag, cleaning around insertion site Reviewed that home health is a resource for issues with foley catheter, wound, with nurse on  call if needed Reviewed wound care orders from discharge summary- Free float heels to offload with pillows, wash feet daily and dry, apply Eucerin, Buttock/ sacrum/ coccyx area- apply silicone dressing q 3 days, observe daily by lifting edges, use barrier cream and keep skin clean and dry Reviewed that daughter is crushing meds and this was ok'd at the [facility] Reviewed importance of having a BM.  Had BM on 10/20/2024.  Encouraged daughter to give miralax  daily.  Reviewed importance of keeping pressure off bottom.  Encouraged patient to ambulate.  According to daughter- patient  only want to sit.  Confirmed patient is active with PT and therapist is coming today.  Encouraged daughter to get patient to get up and walk to the bathroom every 2 hours to get some exercise.  Encouraged daughter to call MD for changes in condition or to call homehealth who is available 24/7.  Patient Self Care Activities:  Attend all scheduled provider appointments Call pharmacy for medication refills 3-7 days in advance of running out of medications Call provider office for new concerns or questions  Notify RN Care Manager of TOC call rescheduling needs Participate in Transition of Care Program/Attend TOC scheduled calls Take medications as prescribed   Report any signs of infection, UTI to your doctor Drink adequate fluids, preferably water Include adequate protein at each meal to help with wound healing Be careful of how many carbohydrates you consume at each meal, decrease instant oatmeal as it has a lot of sugar Change positions frequently, keep bottom clean and dry. Take all medications as prescribed. Take Miralx.  Call MD for continued high CBG reading Continue to observe for signs of UTI or infection of pressure ulcers.     Plan:  Telephone follow up appointment with care management team member scheduled for:   11/02/2024 @ 11 am with Alan Ee RN The patient has been provided with contact information for the  care management team and has been advised to call with any health related questions or concerns.          Our next appointment is by telephone on 11/02/2024 at 11a  Please call the care guide team at [phone] if you need to cancel or reschedule your appointment.   If you are experiencing a Mental Health or Behavioral Health Crisis or need someone to talk to, please call the Suicide and Crisis Lifeline: 988 call the [place]  National Suicide Prevention Lifeline: 3173943752 or TTY: 737-622-4418 TTY 7728826786) to talk to a trained counselor call 1-800-273-TALK (toll free, 24 hour hotline) call 911   Care plan and visit instructions communicated with the patient verbally today. Patient agrees to receive a copy in MyChart. Active MyChart status and patient understanding of how to access instructions and care plan via MyChart confirmed with patient.    Alan Ee, RN, BSN, CEN [employer]- Transition of Care Team.  [facility] [phone]

## 2024-10-28 ENCOUNTER — Ambulatory Visit: Payer: Self-pay

## 2024-10-28 NOTE — Telephone Encounter (Signed)
 FYI Only or Action Required?: FYI only for provider: home care advised.  Patient was last seen in primary care on 10/20/2024 by Ryan Jeoffrey RAMAN, FNP.  Called Nurse Triage reporting Urinary Retention.  Symptoms began today.  Interventions attempted: Nothing.  Symptoms are: stable.  Triage Disposition: Home Care  Patient/caregiver understands and will follow disposition?: Yes    Reason for Triage: Ryan Gilmore, daughter, is calling to see what to do about her dad,Ryan Gilmore, not urinating overnight. He has catheter and the bag was empty this morning and usually it is full. He forgot to take his tamsulosin  (FLOMAX ) 0.4 MG CAPS capsule last night.   Reason for Disposition  Urinary catheter care, questions about  Answer Assessment - Initial Assessment Questions 1. SYMPTOMS: What symptoms are you concerned about?     Daughter of pt Ryan Gilmore is reporting the pt's catheter bag has no urine in it from overnight. Also reports the pt did not take his nightly Flomax  yesterday. Ryan Gilmore reports the pt is behaving per baseline and in no acute discomfort. Ryan Gilmore instructed to give the pt the missed dose of Flomax  and educated on its purposes in allowing urine to pass more easily. Ryan Gilmore instructed to call back if anything changes or worsens.  Protocols used: Urinary Catheter (e.g., Foley) Symptoms and Questions-A-AH

## 2024-10-29 ENCOUNTER — Encounter (HOSPITAL_COMMUNITY): Payer: Self-pay | Admitting: *Deleted

## 2024-10-29 ENCOUNTER — Other Ambulatory Visit: Payer: Self-pay

## 2024-10-29 ENCOUNTER — Emergency Department (HOSPITAL_COMMUNITY)
Admission: EM | Admit: 2024-10-29 | Discharge: 2024-10-29 | Disposition: A | Discharge status: Home / Self Care | Attending: Emergency Medicine | Admitting: Emergency Medicine

## 2024-10-29 DIAGNOSIS — E1165 Type 2 diabetes mellitus with hyperglycemia: Secondary | ICD-10-CM | POA: Diagnosis not present

## 2024-10-29 DIAGNOSIS — F0390 Unspecified dementia without behavioral disturbance: Secondary | ICD-10-CM | POA: Insufficient documentation

## 2024-10-29 DIAGNOSIS — Z79899 Other long term (current) drug therapy: Secondary | ICD-10-CM | POA: Diagnosis not present

## 2024-10-29 DIAGNOSIS — T83091A Other mechanical complication of indwelling urethral catheter, initial encounter: Secondary | ICD-10-CM

## 2024-10-29 DIAGNOSIS — Y732 Prosthetic and other implants, materials and accessory gastroenterology and urology devices associated with adverse incidents: Secondary | ICD-10-CM | POA: Diagnosis not present

## 2024-10-29 DIAGNOSIS — T83098A Other mechanical complication of other indwelling urethral catheter, initial encounter: Secondary | ICD-10-CM | POA: Diagnosis present

## 2024-10-29 DIAGNOSIS — Z7982 Long term (current) use of aspirin: Secondary | ICD-10-CM | POA: Insufficient documentation

## 2024-10-29 LAB — CBC WITH DIFFERENTIAL/PLATELET
Abs Immature Granulocytes: 0.2 10*3/uL — ABNORMAL HIGH (ref 0.00–0.07)
Basophils Absolute: 0.1 10*3/uL (ref 0.0–0.1)
Basophils Relative: 0 %
Eosinophils Absolute: 0.1 10*3/uL (ref 0.0–0.5)
Eosinophils Relative: 0 %
HCT: 50.3 % (ref 39.0–52.0)
Hemoglobin: 15.7 g/dL (ref 13.0–17.0)
Immature Granulocytes: 1 %
Lymphocytes Relative: 45 %
Lymphs Abs: 11.9 10*3/uL — ABNORMAL HIGH (ref 0.7–4.0)
MCH: 28.4 pg (ref 26.0–34.0)
MCHC: 31.2 g/dL (ref 30.0–36.0)
MCV: 91 fL (ref 80.0–100.0)
Monocytes Absolute: 0.7 10*3/uL (ref 0.1–1.0)
Monocytes Relative: 3 %
Neutro Abs: 13.7 10*3/uL — ABNORMAL HIGH (ref 1.7–7.7)
Neutrophils Relative %: 51 %
Platelets: 368 10*3/uL (ref 150–400)
RBC: 5.53 MIL/uL (ref 4.22–5.81)
RDW: 13.5 % (ref 11.5–15.5)
WBC: 26.8 10*3/uL — ABNORMAL HIGH (ref 4.0–10.5)
nRBC: 0 % (ref 0.0–0.2)

## 2024-10-29 LAB — CBG MONITORING, ED
Glucose-Capillary: 304 mg/dL — ABNORMAL HIGH (ref 70–99)
Glucose-Capillary: 194 mg/dL — ABNORMAL HIGH (ref 70–99)

## 2024-10-29 LAB — BASIC METABOLIC PANEL WITH GFR
Anion gap: 18 — ABNORMAL HIGH (ref 5–15)
BUN: 23 mg/dL (ref 8–23)
CO2: 16 mmol/L — ABNORMAL LOW (ref 22–32)
Calcium: 8.1 mg/dL — ABNORMAL LOW (ref 8.9–10.3)
Chloride: 108 mmol/L (ref 98–111)
Creatinine, Ser: 1.09 mg/dL (ref 0.61–1.24)
GFR, Estimated: >60 mL/min
Glucose, Bld: 226 mg/dL — ABNORMAL HIGH (ref 70–99)
Potassium: 4.8 mmol/L (ref 3.5–5.1)
Sodium: 143 mmol/L (ref 135–145)

## 2024-10-29 MED ORDER — SODIUM CHLORIDE 0.9 % IV BOLUS
1000.0000 mL | Freq: Once | INTRAVENOUS | Status: AC
  Administered 2024-10-29: 1000 mL via INTRAVENOUS

## 2024-10-29 NOTE — ED Triage Notes (Addendum)
 BIB family from home for foley catheter obstruction. Endorses urinary retention, decreased output, foley malfunction, dysuria, urgency. Placed initially in December. Has had it switched out multiple times, and failed previous trials. [facility] of [facility]. Alert, NAD, calm, interactive. At baseline per daughter. Has c/o intermittent bladder pain, none now. Apparent blockage occurred yesterday morning, and was flushed by home health RN. Reoccurred this am. H/o dementia and DM. Pt/[facility].

## 2024-10-29 NOTE — Discharge Instructions (Signed)
 Ryan Gilmore was seen in the emergency room for an obstructed Foley catheter We were able to relieve the obstruction and the Foley catheter is working well His blood sugar was high which improved after some IV fluids Continue to check his blood sugars at home [facility]-up with [facility] as scheduled to have Foley catheter evaluated on February 3 in the office Return for repeated obstruction or any other concerns

## 2024-10-29 NOTE — ED Provider Notes (Signed)
 " [place] EMERGENCY DEPARTMENT AT [facility] Provider Note   CSN: [id number] Arrival date & time: 10/29/24  1242     Patient presents with: Urinary Retention   Ryan Gilmore is a 89 y.o. male.  With a history of dementia, type 2 diabetes and urinary retention with indwelling Foley who presents to the ED for urinary retention.  Patient was seen here in December for UTI hyperglycemia with urinary retention and Foley catheter was placed.  He was discharged with Foley catheter in place and followed up with [facility] where spontaneous voiding trial was unsuccessful.  Foley was left in place and he has a follow-up appointment again with urology on February 3.  Foley was functioning well at home until this morning when home health nurse was unable to flush.  Patient voiced some discomfort and suprapubic region and told his family members he needed to urinate.  No fevers chills.  Decreased p.o. intake over the last few days but he did have breakfast today.    HPI     Prior to Admission medications  Medication Sig Start Date End Date Taking? Authorizing Provider  acetaminophen  (TYLENOL ) 325 MG tablet Take 2 tablets (650 mg total) by mouth every 6 (six) hours as needed for mild pain (pain score 1-3) or fever (or Fever >/= 101). 10/06/24   Barbarann Nest, MD  amLODipine  (NORVASC ) 10 MG tablet Take 0.5 tablets (5 mg total) by mouth daily. 10/07/24 11/06/24  Barbarann Nest, MD  aspirin  EC 81 MG tablet Take 81 mg by mouth daily. Swallow whole.    [provider]  Blood Glucose Monitoring Suppl (BLOOD GLUCOSE SYSTEM PAK) KIT Please dispense based on patient and insurance preference. Use as directed to monitor FSBS 2x. Dx: E11.E11.65. 03/24/19   Duanne Butler DASEN, MD  empagliflozin (JARDIANCE) 25 MG TABS tablet Take 25 mg by mouth daily.    [provider]  glipiZIDE  (GLUCOTROL ) 5 MG tablet Take by mouth 2 (two) times daily before a meal.    [provider]   Glucose Blood (BLOOD GLUCOSE TEST STRIPS) STRP Please dispense one touch verio flex lancets, and testing strips. Use as directed to monitor FSBS 2x. Dx: E11.E11.65. 03/30/19   Duanne Butler DASEN, MD  hydrocerin (EUCERIN) CREA Apply 1 Application topically 2 (two) times daily. Apply to feet/heels twice daily; do not place in between toes 10/06/24   Barbarann Nest, MD  Lancets MISC Please dispense based on patient and insurance preference. Use as directed to monitor FSBS 2x. Dx: E11.E11.65. 03/24/19   Duanne Butler DASEN, MD  liver oil-zinc  oxide (DESITIN) 40 % ointment Apply topically 2 (two) times daily. Apply to buttocks twice daily and as needed for soiling 10/06/24   Barbarann Nest, MD  metFORMIN  (GLUCOPHAGE ) 500 MG tablet TAKE 2 TABLETS BY MOUTH TWICE DAILY WITH A MEAL 05/09/19   Duanne Butler DASEN, MD  Multiple Vitamin (MULTIVITAMIN) tablet Take 1 tablet by mouth daily.    [provider]  mupirocin  ointment (BACTROBAN ) 2 % Apply 1 Application topically 2 (two) times daily. Apply1 gram to wound. 10/18/24 01/16/25  Christine Rush, DPM  pioglitazone  (ACTOS ) 30 MG tablet Take 1 tablet (30 mg total) by mouth daily. 06/30/23   Duanne Butler DASEN, MD  polyethylene glycol (MIRALAX ) 17 g packet Take 17 g by mouth daily. 10/09/24   Kingsley, Victoria K, DO  psyllium (METAMUCIL SMOOTH TEXTURE) 58.6 % powder Take 1 packet by mouth 3 (three) times daily. 10/09/24   Ellouise Fine  K, DO  rosuvastatin  (CRESTOR ) 40 MG tablet Take 40 mg by mouth daily.    [provider]  senna-docusate (SENOKOT-S) 8.6-50 MG tablet Take 1 tablet by mouth daily. 10/09/24   Kingsley, Victoria K, DO  tamsulosin  (FLOMAX ) 0.4 MG CAPS capsule Take 1 capsule (0.4 mg total) by mouth daily after supper. 10/06/24   Barbarann Nest, MD    Allergies: Patient has no known allergies.    Review of Systems  Updated Vital Signs BP 138/72   Pulse 78   Temp 98.7 F (37.1 C)   Resp 17   Wt 74.4 kg   SpO2 100%   BMI 22.87 kg/m    Physical Exam Vitals and nursing note reviewed.  HENT:     Head: Normocephalic and atraumatic.  Eyes:     Pupils: Pupils are equal, round, and reactive to light.  Cardiovascular:     Rate and Rhythm: Normal rate and regular rhythm.  Pulmonary:     Effort: Pulmonary effort is normal.     Breath sounds: Normal breath sounds.  Abdominal:     General: There is distension.     Palpations: Abdomen is soft.     Tenderness: There is abdominal tenderness.     Comments: Suprapubic tenderness distention  Genitourinary:    Comments: Foley catheter in place with about 300 cc dark clear yellow urine in collection bag Skin:    General: Skin is warm and dry.  Neurological:     Mental Status: He is alert.  Psychiatric:        Mood and Affect: Mood normal.     (all labs ordered are listed, but only abnormal results are displayed) Labs Reviewed  CBC WITH DIFFERENTIAL/PLATELET - Abnormal; Notable for the following components:      Result Value   WBC 26.8 (*)    Neutro Abs 13.7 (*)    Lymphs Abs 11.9 (*)    Abs Immature Granulocytes 0.20 (*)    All other components within normal limits  BASIC METABOLIC PANEL WITH GFR - Abnormal; Notable for the following components:   CO2 16 (*)    Glucose, Bld 226 (*)    Calcium  8.1 (*)    Anion gap 18 (*)    All other components within normal limits  CBG MONITORING, ED - Abnormal; Notable for the following components:   Glucose-Capillary 304 (*)    All other components within normal limits  CBG MONITORING, ED - Abnormal; Notable for the following components:   Glucose-Capillary 194 (*)    All other components within normal limits    EKG: None  Radiology: No results found.   Procedures   Medications Ordered in the ED  sodium chloride  0.9 % bolus 1,000 mL (1,000 mLs Intravenous New Bag/Given 10/29/24 1511)    Clinical Course as of 10/29/24 1706  Sat Oct 29, 2024  1654 Laboratory workup notable for persistent leukocytosis which with  known h/o lymphoma. Foley catheter appears to be functioning well.  He will follow-up with urology as scheduled with plan for discharge with Foley catheter in place.  Blood glucose appropriately downtrending after some IV fluids in the 100s now [MP]    Clinical Course User Index [MP] Pamella Ozell LABOR, DO                                 Medical Decision Making 89 year old male with history as above presenting for urinary retention in  the setting of Foley catheter obstruction.  Bedside irrigation by paramedic Francis relieved obstruction and immediately output about 700 cc clear yellow urine.  Hyperglycemia in the 300s.  Will provide IV fluids for rehydration and ensure Foley is continuing to have satisfactory output.  Will send basic laboratory workup given decreased p.o. intake and continue to monitor here.  Discharge likely.  Amount and/or Complexity of Data Reviewed Labs: ordered.        Final diagnoses:  Obstruction of Foley catheter, initial encounter  Hyperglycemia due to diabetes mellitus [facility])    ED Discharge Orders     None          Pamella Ozell LABOR, DO 10/29/24 1706  "

## 2024-10-29 NOTE — ED Notes (Signed)
 Emptied catheter bag. Approx. 1471mL's emptied from bag.

## 2024-11-01 ENCOUNTER — Ambulatory Visit: Admitting: Podiatry

## 2024-11-01 ENCOUNTER — Encounter: Payer: Self-pay | Admitting: Podiatry

## 2024-11-01 DIAGNOSIS — L97401 Non-pressure chronic ulcer of unspecified heel and midfoot limited to breakdown of skin: Secondary | ICD-10-CM

## 2024-11-01 NOTE — Progress Notes (Signed)
 Patient presents for follow-up ulcers heels bilaterally.  Type II diabetic.  They have been applying the Santyl and soaking once a day.  Physical Exam:  Patient alert and oriented x 3.  No complaints of nausea, vomiting, fever, or chills  Vascular: DP pulses 2/4 bilateral. PT pulses 1/4 lateral.  Mild edema lower extremity. Capillary fill time immediate bilaterally. Dermatologic: Superficial ulceration plantar posterior aspect heel left penetrating the dermis 20 x 30 cm area of eschar and darkening of the dermis.  Some serous drainage.  No signs of infection.    Superficial ulceration plantar posterior aspect heel right penetrating the dermis 20 x 30 cm area of eschar and darkening of the dermis.  Some serous drainage.Measures 60 mm wide x 40 mm long x 2 deep. .  No signs of infection.  Neurologic:   Musculoskeletal:   Diagnoses: 1.  Superficial ulcer to Wagner grade 2 heels bilaterally  Plan: - Continue soaking once daily apply Santyl ointment and a dry sterile dressing -Sharp debridement with tissue nippers superficial Wagner grade 1 ulceration heels bilaterally.  Lightly debrided any devitalized tissue with a tissue nipper.  Applied antibiotic ointment and a light dressing  -Refer for consultation and evaluation and management at wound care clinic  Return 2 weeks f/u ulcer

## 2024-11-02 ENCOUNTER — Other Ambulatory Visit

## 2024-11-02 NOTE — Patient Instructions (Signed)
 Visit Information  Thank you for taking time to visit with me today. Please don't hesitate to contact me if I can be of assistance to you before our next scheduled telephone appointment.  Following is a copy of your care plan:   Goals Addressed             This Visit's Progress    VBCI Transitions of Care (TOC) Care Plan       Problems:  Recent Hospitalization for treatment of UTI (urinary tract infection)-10/19/2024  daughter states that patient failed voiding trial and still has catheter.  Next voiding trial on 11/08/2024,  10/26/2024  no changes.  Knowledge Deficit Related to diabetes management, diet and No Hospital Follow Up Provider appointment spouse declines for RN CM to schedule post hospital follow up appointment, states her daughter handles all appointments and everything  daughter is at work, unable to speak with her today  10/19/2024   Spoke with daughter today and she states that both parents have dementia.  Reports that she is preparing to move in with her parents to take care of them.    Daughter reports high CBG today, patient not eating well, no BM for 14 days. Sleeps a lot, 3 pressure ulcers, difficulty to get patient to take his medications- reports that he holds meds in his mouth. 10/21/2024   daughter reports patient continues to have difficulty holding his pills in his mouth,  Daughter is inspecting mouth after giving medications.  10/26/2024  per daughter patient CBG are slowly improving with daughter being in charge of medications. Wound care per daughter and home health nurse. [facility] and was [facility] to [facility] which daughter is comfortable doing.  CBG today of 217 Per spouse, home health has seen pt, is providing oversight for wound care as well as daughter is changing dressing to buttock/ sacral area as needed  10/19/2024  PT coming today. Nurse coming tomorrow. 10/21/2024  seen by home health today.  10/26/2024  PT is seeing patient today. Daughter reports  patient is move around more.  Pt has dementia, per spouse, varies day to day, states  some days he knows me, some days he doesn't Spouse reports CBG is monitored and my daughter keeps up with it, I don't know any readings  Spouse reports pt typically eats 2 packs of instant oatmeal, 1 slice of bread and sausage for breakfast,   10/19/2024  fasting CBG today of 326. Daughter reports that she did not give patient his medications last night because patient did not eat dinner.  Patient ate breakfast this am ( oatmeal and a bananna) and patient did not take medications and patient is back asleep. 10/21/2024  fasting CBG of 238 today.  Daughter to discuss CBG readings with endocrinology . 10/26/2024   [facility]. Saw [facility]iatry yesterday. Foot care and dressing instructions provided to family and home health.   Daughter thinks the left foot is worse than the right. 11/02/24 - Podiatry seen yesterday. Continue with epsom salts and bandage. The patient has been more compliant with taking his medications since they are crushing the meds and putting them in applesauce. BG have been 151, 173 and 278 this am. The patient's daughter states her father goes to the [place] and she make and appointment for him to see Hem/Onc there. The patient did go to the ED on 10/29/24 because his foley didn't flush and he had a new catheter placed which seems to be working today.  Goal:  Over the next 30 days, the patient will not experience hospital readmission  Interventions:  Discussed plans with daughter for ongoing care management follow up and provided patient with direct contact information for care management team Reviewed medications with patient and discussed importance of taking as prescribed- reviewed each medication with daughter and updated EMR.  Assess CBG today and fasting is 217  Reviewed trends of A1c with daughter and concerns for high A1c.  Daughter reports that patient goes to the [place] for DM  treatment. Reviewed carbohydrate modified diet, importance of good CBG control for wound healing Reviewed signs/ symptoms of infection, UTI- discussed voiding trial and patient did not pass. Continues to have foley.  Daughter reports blood at insertion site of the penis.  No blood in foley bag.   Reviewed care of foley catheter, using aseptic technique for emptying bag, cleaning around insertion site Reviewed that home health is a resource for issues with foley catheter, wound, with nurse on call if needed Reviewed wound care orders from discharge summary- Free float heels to offload with pillows, wash feet daily and dry, apply Eucerin, Buttock/ sacrum/ coccyx area- apply silicone dressing q 3 days, observe daily by lifting edges, use barrier cream and keep skin clean and dry Reviewed that daughter is crushing meds and this was ok'd at the [place] Reviewed importance of having a BM.  Had BM on 10/20/2024.  Encouraged daughter to give miralax  daily.  Reviewed importance of keeping pressure off bottom.  Encouraged patient to ambulate.  According to daughter- patient  only want to sit.  Confirmed patient is active with PT and therapist is coming today.  Encouraged daughter to get patient to get up and walk to the bathroom every 2 hours to get some exercise.  Encouraged daughter to call MD for changes in condition or to call homehealth who is available 24/7.  Patient Self Care Activities:  Attend all scheduled provider appointments Call pharmacy for medication refills 3-7 days in advance of running out of medications Call provider office for new concerns or questions  Notify RN Care Manager of TOC call rescheduling needs Participate in Transition of Care Program/Attend TOC scheduled calls Take medications as prescribed   Report any signs of infection, UTI to your doctor Drink adequate fluids, preferably water Include adequate protein at each meal to help with wound healing Be careful of how many  carbohydrates you consume at each meal, decrease instant oatmeal as it has a lot of sugar Change positions frequently, keep bottom clean and dry. Take all medications as prescribed. Take Miralx.  Call MD for continued high CBG reading Continue to observe for signs of UTI or infection of pressure ulcers.     Plan:  Telephone follow up appointment with care management team member scheduled for:  Wednesday February 4th at 1:00pm with Alan Ee RN The patient has been provided with contact information for the care management team and has been advised to call with any health related questions or concerns.         The patient verbalized understanding of instructions, educational materials, and care plan provided today and agreed to receive a mailed copy of patient instructions, educational materials, and care plan.   The patient has been provided with contact information for the care management team and has been advised to call with any health related questions or concerns.   Please call the care guide team at [phone] if you need to cancel or reschedule your appointment.  Please call the Suicide and Crisis Lifeline: 988 call the [place]  National Suicide Prevention Lifeline: 704-593-2145 or TTY: (250)856-2719 TTY 312-162-3531) to talk to a trained counselor if you are experiencing a Mental Health or Behavioral Health Crisis or need someone to talk to.  Medford Balboa, BSN, RN [place]  VBCI - [employer] [facility] RN Care Manager [phone]

## 2024-11-02 NOTE — Transitions of Care (Post Inpatient/ED Visit) (Signed)
 " Transition of Care week 3  Visit Note  11/02/2024  Name: Ryan Gilmore MRN: [id number]          DOB: 04/04/1933  Situation: Patient enrolled in [facility] 30-day program. Visit completed with Cam Broadnax, DPR by telephone.   Background:   Initial Transition Care Management Follow-up Telephone Call Discharge Date and Diagnosis: 10/06/24, UTI (urinary tract infection)   Past Medical History:  Diagnosis Date   Bleeding duodenal ulcer    ?  1980s   Dementia (HCC)    Diabetes mellitus without complication (HCC)    Fall 09/30/2024   Hyperlipidemia     Assessment: Patient Reported Symptoms: Cognitive Cognitive Status: Unable to Assess      Neurological Neurological Review of Symptoms: Other: [place] Neurological Symptoms/Conditions [RPT]: Dementia Neurological Management Strategies: Routine screening  HEENT HEENT Symptoms Reported: No symptoms reported HEENT Management Strategies: Routine screening    Cardiovascular Cardiovascular Symptoms Reported: No symptoms reported    Respiratory Respiratory Symptoms Reported: No symptoms reported    Endocrine Endocrine Symptoms Reported: No symptoms reported Is patient diabetic?: Yes Is patient checking blood sugars at home?: Yes List most recent blood sugar readings, include date and time of day: BS was 278 this am. Yesterday they were 151, 173 Endocrine Comment: The patient has been compliant with medication  Gastrointestinal Gastrointestinal Symptoms Reported: No symptoms reported      Genitourinary Genitourinary Symptoms Reported: Other Other Genitourinary Symptoms: Urinary Obstruction. The patient was in the ED 10/29/24  due to [facility] could not flush the catheter and he had it removed and a new one replaced Genitourinary Management Strategies: Catheter, indwelling Indwelling Catheter Inserted: 10/29/24  Integumentary Integumentary Symptoms Reported: Wound Additional Integumentary Details: Wound to buttocks and both heels Skin Management  Strategies: Dressing changes, Coping strategies, Routine screening Skin Comment: The wound is managed by the family and HH  Musculoskeletal Musculoskelatal Symptoms Reviewed: Limited mobility Additional Musculoskeletal Details: Encouraged activity several times a day Musculoskeletal Management Strategies: Routine screening, Activity      Psychosocial Psychosocial Symptoms Reported: Not assessed         There were no vitals filed for this visit.    Medications Reviewed Today     Reviewed by Moises Reusing, RN (Case Manager) on 11/02/24 at 1212  Med List Status: <None>   Medication Order Taking? Sig Documenting Provider Last Dose Status Informant  acetaminophen  (TYLENOL ) 325 MG tablet [id number] Yes Take 2 tablets (650 mg total) by mouth every 6 (six) hours as needed for mild pain (pain score 1-3) or fever (or Fever >/= 101). Barbarann Nest, MD  Active Family Member, Pharmacy Records  amLODipine  (NORVASC ) 10 MG tablet [id number] Yes Take 0.5 tablets (5 mg total) by mouth daily. Barbarann Nest, MD  Active Family Member, Pharmacy Records  aspirin  EC 81 MG tablet [id number] Yes Take 81 mg by mouth daily. Swallow whole. [provider]  Active Family Member, Pharmacy Records  Blood Glucose Monitoring Suppl (BLOOD GLUCOSE SYSTEM PAK) KIT [id number] Yes Please dispense based on patient and insurance preference. Use as directed to monitor FSBS 2x. Dx: E11.E11.65. Duanne Butler DASEN, MD  Active Family Member, Pharmacy Records  empagliflozin (JARDIANCE) 25 MG TABS tablet [id number] Yes Take 25 mg by mouth daily. [provider]  Active Family Member, Pharmacy Records  glipiZIDE  (GLUCOTROL ) 5 MG tablet [id number] Yes Take by mouth 2 (two) times daily before a meal. [provider]  Active Family Member, Pharmacy Records  Glucose Blood (BLOOD GLUCOSE TEST STRIPS) STRP [id number]  Yes Please dispense one touch verio flex lancets, and testing strips. Use as directed to monitor FSBS  2x. Dx: E11.E11.65. Duanne Butler DASEN, MD  Active Family Member, Pharmacy Records  hydrocerin [facility]) CREA [id number] Yes Apply 1 Application topically 2 (two) times daily. Apply to feet/heels twice daily; do not place in between toes Barbarann Nest, MD  Active Family Member, Pharmacy Records  Lancets MISC [id number] Yes Please dispense based on patient and insurance preference. Use as directed to monitor FSBS 2x. Dx: E11.E11.65. Duanne Butler DASEN, MD  Active Family Member, Pharmacy Records  liver oil-zinc  oxide (DESITIN) 40 % ointment [id number] Yes Apply topically 2 (two) times daily. Apply to buttocks twice daily and as needed for soiling Barbarann Nest, MD  Active Family Member, Pharmacy Records  metFORMIN  (GLUCOPHAGE ) 500 MG tablet [id number] Yes TAKE 2 TABLETS BY MOUTH TWICE DAILY WITH A MEAL Pickard, Butler DASEN, MD  Active Family Member, Pharmacy Records  Multiple Vitamin (MULTIVITAMIN) tablet [id number] Yes Take 1 tablet by mouth daily. [provider]  Active Family Member, Pharmacy Records  mupirocin  ointment (BACTROBAN ) 2 % [id number] Yes Apply 1 Application topically 2 (two) times daily. Apply1 gram to wound. George Alcantar Rush, DPM  Active   pioglitazone  (ACTOS ) 30 MG tablet [id number] Yes Take 1 tablet (30 mg total) by mouth daily. Duanne Butler DASEN, MD  Active Family Member, Pharmacy Records  polyethylene glycol (MIRALAX ) 17 g packet [id number] Yes Take 17 g by mouth daily. Kingsley, Victoria K, DO  Active   psyllium (METAMUCIL SMOOTH TEXTURE) 58.6 % powder [id number] Yes Take 1 packet by mouth 3 (three) times daily. Kingsley, Victoria K, DO  Active   rosuvastatin  (CRESTOR ) 40 MG tablet [id number] Yes Take 40 mg by mouth daily. [provider]  Active Family Member, Pharmacy Records  senna-docusate (SENOKOT-S) 8.6-50 MG tablet [id number] Yes Take 1 tablet by mouth daily. Kingsley, Victoria K, DO  Active   tamsulosin  (FLOMAX ) 0.4 MG CAPS capsule [id number] Yes Take 1 capsule (0.4 mg  total) by mouth daily after supper. Barbarann Nest, MD  Active Family Member, Pharmacy Records            Recommendation:   Continue Current Plan of Care  Follow Up Plan:   Telephone follow-up in 1 week  Medford Balboa, BSN, RN [place]  VBCI - [facility] RN Care Manager [phone]     "

## 2024-11-03 ENCOUNTER — Encounter (HOSPITAL_COMMUNITY): Payer: Self-pay

## 2024-11-03 ENCOUNTER — Other Ambulatory Visit: Payer: Self-pay

## 2024-11-03 ENCOUNTER — Emergency Department (HOSPITAL_COMMUNITY)
Admission: EM | Admit: 2024-11-03 | Discharge: 2024-11-03 | Disposition: A | Discharge status: Home / Self Care | Attending: Emergency Medicine | Admitting: Emergency Medicine

## 2024-11-03 ENCOUNTER — Emergency Department (HOSPITAL_COMMUNITY)

## 2024-11-03 DIAGNOSIS — Z7984 Long term (current) use of oral hypoglycemic drugs: Secondary | ICD-10-CM | POA: Insufficient documentation

## 2024-11-03 DIAGNOSIS — Y732 Prosthetic and other implants, materials and accessory gastroenterology and urology devices associated with adverse incidents: Secondary | ICD-10-CM | POA: Insufficient documentation

## 2024-11-03 DIAGNOSIS — F0390 Unspecified dementia without behavioral disturbance: Secondary | ICD-10-CM | POA: Insufficient documentation

## 2024-11-03 DIAGNOSIS — T839XXA Unspecified complication of genitourinary prosthetic device, implant and graft, initial encounter: Secondary | ICD-10-CM

## 2024-11-03 DIAGNOSIS — D72829 Elevated white blood cell count, unspecified: Secondary | ICD-10-CM | POA: Insufficient documentation

## 2024-11-03 DIAGNOSIS — Z7982 Long term (current) use of aspirin: Secondary | ICD-10-CM | POA: Insufficient documentation

## 2024-11-03 DIAGNOSIS — T83098A Other mechanical complication of other indwelling urethral catheter, initial encounter: Secondary | ICD-10-CM | POA: Insufficient documentation

## 2024-11-03 DIAGNOSIS — E119 Type 2 diabetes mellitus without complications: Secondary | ICD-10-CM | POA: Insufficient documentation

## 2024-11-03 LAB — CBC WITH DIFFERENTIAL/PLATELET
Abs Immature Granulocytes: 0.25 10*3/uL — ABNORMAL HIGH (ref 0.00–0.07)
Basophils Absolute: 0.1 10*3/uL (ref 0.0–0.1)
Basophils Relative: 0 %
Eosinophils Absolute: 0 10*3/uL (ref 0.0–0.5)
Eosinophils Relative: 0 %
HCT: 50 % (ref 39.0–52.0)
Hemoglobin: 15.2 g/dL (ref 13.0–17.0)
Immature Granulocytes: 1 %
Lymphocytes Relative: 36 %
Lymphs Abs: 11.1 10*3/uL — ABNORMAL HIGH (ref 0.7–4.0)
MCH: 28.5 pg (ref 26.0–34.0)
MCHC: 30.4 g/dL (ref 30.0–36.0)
MCV: 93.6 fL (ref 80.0–100.0)
Monocytes Absolute: 1.4 10*3/uL — ABNORMAL HIGH (ref 0.1–1.0)
Monocytes Relative: 4 %
Neutro Abs: 18.3 10*3/uL — ABNORMAL HIGH (ref 1.7–7.7)
Neutrophils Relative %: 59 %
Platelets: 296 10*3/uL (ref 150–400)
RBC: 5.34 MIL/uL (ref 4.22–5.81)
RDW: 14.3 % (ref 11.5–15.5)
Smear Review: NORMAL
WBC: 31.2 10*3/uL — ABNORMAL HIGH (ref 4.0–10.5)
nRBC: 0 % (ref 0.0–0.2)

## 2024-11-03 LAB — COMPREHENSIVE METABOLIC PANEL WITH GFR
ALT: 7 U/L (ref 0–44)
AST: 20 U/L (ref 15–41)
Albumin: 3.9 g/dL (ref 3.5–5.0)
Alkaline Phosphatase: 103 U/L (ref 38–126)
Anion gap: 21 — ABNORMAL HIGH (ref 5–15)
BUN: 26 mg/dL — ABNORMAL HIGH (ref 8–23)
CO2: 19 mmol/L — ABNORMAL LOW (ref 22–32)
Calcium: 10.3 mg/dL (ref 8.9–10.3)
Chloride: 102 mmol/L (ref 98–111)
Creatinine, Ser: 1.3 mg/dL — ABNORMAL HIGH (ref 0.61–1.24)
GFR, Estimated: 53 mL/min — ABNORMAL LOW
Glucose, Bld: 280 mg/dL — ABNORMAL HIGH (ref 70–99)
Potassium: 5 mmol/L (ref 3.5–5.1)
Sodium: 143 mmol/L (ref 135–145)
Total Bilirubin: 0.5 mg/dL (ref 0.0–1.2)
Total Protein: 8 g/dL (ref 6.5–8.1)

## 2024-11-03 LAB — URINALYSIS, W/ REFLEX TO CULTURE (INFECTION SUSPECTED)
Bilirubin Urine: NEGATIVE
Glucose, UA: >=500 mg/dL — AB
Ketones, ur: 5 mg/dL — AB
Nitrite: NEGATIVE
Protein, ur: 30 mg/dL — AB
RBC / HPF: >50 RBC/hpf (ref 0–5)
Specific Gravity, Urine: 1.024 (ref 1.005–1.030)
WBC, UA: >50 WBC/hpf (ref 0–5)
pH: 5 (ref 5.0–8.0)

## 2024-11-03 LAB — LIPASE, BLOOD: Lipase: 13 U/L (ref 11–51)

## 2024-11-03 MED ORDER — FLUCONAZOLE 200 MG PO TABS: 200.0000 mg | ORAL_TABLET | Freq: Every day | ORAL | 0 refills | Status: AC

## 2024-11-03 MED ORDER — IOHEXOL 300 MG/ML  SOLN
100.0000 mL | Freq: Once | INTRAMUSCULAR | Status: AC | PRN
  Administered 2024-11-03: 100 mL via INTRAVENOUS

## 2024-11-03 MED ORDER — CEPHALEXIN 250 MG PO CAPS: 250.0000 mg | ORAL_CAPSULE | Freq: Four times a day (QID) | ORAL | 0 refills | Status: AC

## 2024-11-03 MED ORDER — SODIUM CHLORIDE 0.9 % IV SOLN
1.0000 g | Freq: Once | INTRAVENOUS | Status: AC
  Administered 2024-11-03: 1 g via INTRAVENOUS
  Filled 2024-11-03: qty 10

## 2024-11-03 NOTE — ED Provider Notes (Signed)
 " [place] EMERGENCY DEPARTMENT AT [facility] Provider Note   CSN: [id number] Arrival date & time: 11/03/24  1054     Patient presents with: Foley Catheter Problem   Ryan Gilmore is a 89 y.o. male.   HPI   Patient has a history of diabetes duodenal ulcer dementia hyperlipidemia.  Patient has an indwelling Foley catheter.  Family noticed decreased drainage last 12 hours.  He also noticed blood in the commode this morning.  Patient has dementia at baseline.  History is primarily provided by the daughters.  No known fevers.  No known vomiting or diarrhea  Prior to Admission medications  Medication Sig Start Date End Date Taking? Authorizing Provider  cephALEXin  (KEFLEX ) 250 MG capsule Take 1 capsule (250 mg total) by mouth 4 (four) times daily for 7 days. 11/03/24 11/10/24 Yes Randol Simmonds, MD  fluconazole  (DIFLUCAN ) 200 MG tablet Take 1 tablet (200 mg total) by mouth daily for 7 days. 11/03/24 11/10/24 Yes Randol Simmonds, MD  acetaminophen  (TYLENOL ) 325 MG tablet Take 2 tablets (650 mg total) by mouth every 6 (six) hours as needed for mild pain (pain score 1-3) or fever (or Fever >/= 101). 10/06/24   Barbarann Nest, MD  amLODipine  (NORVASC ) 10 MG tablet Take 0.5 tablets (5 mg total) by mouth daily. 10/07/24 11/06/24  Barbarann Nest, MD  aspirin  EC 81 MG tablet Take 81 mg by mouth daily. Swallow whole.    [provider]  Blood Glucose Monitoring Suppl (BLOOD GLUCOSE SYSTEM PAK) KIT Please dispense based on patient and insurance preference. Use as directed to monitor FSBS 2x. Dx: E11.E11.65. 03/24/19   Duanne Butler DASEN, MD  empagliflozin (JARDIANCE) 25 MG TABS tablet Take 25 mg by mouth daily.    [provider]  glipiZIDE  (GLUCOTROL ) 5 MG tablet Take by mouth 2 (two) times daily before a meal.    [provider]  Glucose Blood (BLOOD GLUCOSE TEST STRIPS) STRP Please dispense one touch verio flex lancets, and testing strips. Use as directed to monitor FSBS 2x. Dx:  E11.E11.65. 03/30/19   Duanne Butler DASEN, MD  hydrocerin (EUCERIN) CREA Apply 1 Application topically 2 (two) times daily. Apply to feet/heels twice daily; do not place in between toes 10/06/24   Barbarann Nest, MD  Lancets MISC Please dispense based on patient and insurance preference. Use as directed to monitor FSBS 2x. Dx: E11.E11.65. 03/24/19   Duanne Butler DASEN, MD  liver oil-zinc  oxide (DESITIN) 40 % ointment Apply topically 2 (two) times daily. Apply to buttocks twice daily and as needed for soiling 10/06/24   Barbarann Nest, MD  metFORMIN  (GLUCOPHAGE ) 500 MG tablet TAKE 2 TABLETS BY MOUTH TWICE DAILY WITH A MEAL 05/09/19   Duanne Butler DASEN, MD  Multiple Vitamin (MULTIVITAMIN) tablet Take 1 tablet by mouth daily.    [provider]  mupirocin  ointment (BACTROBAN ) 2 % Apply 1 Application topically 2 (two) times daily. Apply1 gram to wound. 10/18/24 01/16/25  Christine Rush, DPM  pioglitazone  (ACTOS ) 30 MG tablet Take 1 tablet (30 mg total) by mouth daily. 06/30/23   Duanne Butler DASEN, MD  polyethylene glycol (MIRALAX ) 17 g packet Take 17 g by mouth daily. 10/09/24   Kingsley, Victoria K, DO  psyllium (METAMUCIL SMOOTH TEXTURE) 58.6 % powder Take 1 packet by mouth 3 (three) times daily. 10/09/24   Kingsley, Victoria K, DO  rosuvastatin  (CRESTOR ) 40 MG tablet Take 40 mg by mouth daily.    [provider]  senna-docusate (SENOKOT-S) 8.6-50 MG  tablet Take 1 tablet by mouth daily. 10/09/24   Kingsley, Victoria K, DO  tamsulosin  (FLOMAX ) 0.4 MG CAPS capsule Take 1 capsule (0.4 mg total) by mouth daily after supper. 10/06/24   Barbarann Nest, MD    Allergies: Patient has no known allergies.    Review of Systems  Updated Vital Signs BP 121/75   Pulse 89   Temp 98.2 F (36.8 C)   Resp 16   Ht 1.803 m (5' 11)   Wt 71.2 kg   SpO2 100%   BMI 21.90 kg/m   Physical Exam Vitals and nursing note reviewed.  Constitutional:      Appearance: He is well-developed. He is not diaphoretic.  HENT:      Head: Normocephalic and atraumatic.     Right Ear: External ear normal.     Left Ear: External ear normal.  Eyes:     General: No scleral icterus.       Right eye: No discharge.        Left eye: No discharge.     Conjunctiva/sclera: Conjunctivae normal.  Neck:     Trachea: No tracheal deviation.  Cardiovascular:     Rate and Rhythm: Normal rate and regular rhythm.  Pulmonary:     Effort: Pulmonary effort is normal. No respiratory distress.     Breath sounds: Normal breath sounds. No stridor. No wheezing or rales.  Abdominal:     General: Bowel sounds are normal. There is no distension.     Palpations: Abdomen is soft.     Tenderness: There is no abdominal tenderness. There is no guarding or rebound.  Genitourinary:    Comments: No gross blood noted on rectal exam, Foley catheter has been replaced draining urine, some blood noted at the urethral meatus Musculoskeletal:        General: No tenderness or deformity.     Cervical back: Neck supple.  Skin:    General: Skin is warm and dry.     Findings: No rash.  Neurological:     General: No focal deficit present.     Mental Status: He is alert.     Cranial Nerves: No cranial nerve deficit, dysarthria or facial asymmetry.     Sensory: No sensory deficit.     Motor: No abnormal muscle tone or seizure activity.     Coordination: Coordination normal.  Psychiatric:        Mood and Affect: Mood normal.     (all labs ordered are listed, but only abnormal results are displayed) Labs Reviewed  COMPREHENSIVE METABOLIC PANEL WITH GFR - Abnormal; Notable for the following components:      Result Value   CO2 19 (*)    Glucose, Bld 280 (*)    BUN 26 (*)    Creatinine, Ser 1.30 (*)    GFR, Estimated 53 (*)    Anion gap 21 (*)    All other components within normal limits  CBC WITH DIFFERENTIAL/PLATELET - Abnormal; Notable for the following components:   WBC 31.2 (*)    Neutro Abs 18.3 (*)    Lymphs Abs 11.1 (*)    Monocytes  Absolute 1.4 (*)    Abs Immature Granulocytes 0.25 (*)    All other components within normal limits  URINALYSIS, W/ REFLEX TO CULTURE (INFECTION SUSPECTED) - Abnormal; Notable for the following components:   APPearance HAZY (*)    Glucose, UA >=500 (*)    Hgb urine dipstick MODERATE (*)    Ketones, ur 5 (*)  Protein, ur 30 (*)    Leukocytes,Ua LARGE (*)    Bacteria, UA RARE (*)    All other components within normal limits  URINE CULTURE  LIPASE, BLOOD    EKG: None  Radiology: CT ABDOMEN PELVIS W CONTRAST Result Date: 11/03/2024 EXAM: CT ABDOMEN AND PELVIS WITH CONTRAST 11/03/2024 03:55:37 PM TECHNIQUE: CT of the abdomen and pelvis was performed with the administration of 100 mL of iohexol  (OMNIPAQUE ) 300 MG/ML solution. Multiplanar reformatted images are provided for review. Automated exposure control, iterative reconstruction, and/or weight-based adjustment of the mA/kV was utilized to reduce the radiation dose to as low as reasonably achievable. COMPARISON: Abdominal radiograph 03/10/2019, PET CT 11/14/2021 and 06/04/2022. CLINICAL HISTORY: Abdominal pain, acute, nonlocalized. Indwelling foley catheter is not draining. Pressure in the abdomen. Bright red blood. FINDINGS: LOWER CHEST: Interstitial changes in the lung bases likely due to fibrosis. Diffuse bronchial wall thickening likely representing chronic bronchitis. LIVER: Circumscribed low attenuation lesion in the right lobe of the liver measuring 1.2 cm in diameter. No activity in this lesion on prior PET CT suggesting benign etiology, likely cyst. Hyperenhancing lesion in the medial segment of the left lobe of the liver measuring 1.8 cm diameter. No abnormal activity on prior PET/CT suggests this is likely a benign lesion, possibly arteriovenous malformation. GALLBLADDER AND BILE DUCTS: The gallbladder is normal. No biliary ductal dilatation. SPLEEN: The spleen is normal. PANCREAS: The pancreas is normal. ADRENAL GLANDS: Adrenal glands  are normal. KIDNEYS, URETERS AND BLADDER: Kidneys and ureters are normal. No stones in the kidneys or ureters. No hydronephrosis. No perinephric or periureteral stranding. The bladder is decompressed with a foley catheter in place. Bladder wall thickening is nonspecific in the setting of decompression. GI AND BOWEL: Stomach, small bowel, and colon are not abnormally distended. No wall thickening or inflammatory stranding. The appendix is not seen. PERITONEUM AND RETROPERITONEUM: No free air or free fluid. No loculated collections. VASCULATURE: Aorta is normal in caliber. Scattered calcifications in the aorta. No aneurysm. LYMPH NODES: Scattered pelvic lymph nodes and groin lymph nodes are not pathologically enlarged, demonstrating decrease in size since prior PET CT. No retroperitoneal lymphadenopathy. REPRODUCTIVE ORGANS: The prostate gland is markedly enlarged, measuring 8.3 cm in diameter. BONES AND SOFT TISSUES: Degenerative changes in the spine. No acute bony abnormalities. No focal soft tissue abnormality. IMPRESSION: 1. Bladder is decompressed with Foley catheter in place, with nonspecific bladder wall thickening in the setting of decompression. 2. Markedly enlarged prostate gland measuring 8.3 cm in diameter. 3. No acute findings in the abdomen or pelvis. Electronically signed by: Elsie Gravely MD 11/03/2024 05:05 PM EST RP Workstation: [id number]     Procedures   Medications Ordered in the ED  iohexol  (OMNIPAQUE ) 300 MG/ML solution 100 mL (100 mLs Intravenous Contrast Given 11/03/24 1540)  cefTRIAXone  (ROCEPHIN ) 1 g in sodium chloride  0.9 % 100 mL IVPB (1 g Intravenous New Bag/Given 11/03/24 1724)    Clinical Course as of 11/03/24 1805  Thu Nov 03, 2024  1635 Comprehensive metabolic panel(!) Metabolic panel normal [JK]  1635 CBC with Differential(!) White blood cell count elevated but similar to previous [JK]  1635 Urinalysis, w/ Reflex to Culture (Infection Suspected) -Urine, Clean  Catch(!) Urinalysis does suggest UTI [JK]  1635 Urinalysis, w/ Reflex to Culture (Infection Suspected) -Urine, Clean Catch(!) Patient also does have evidence of yeast [JK]  1723 CT scan shows decompressed bladder.  Some thickening of the bladder wall [JK]  1802 CT ABDOMEN PELVIS W CONTRAST [JK]    Clinical  Course User Index [JK] Randol Simmonds, MD                                 Medical Decision Making Problems Addressed: Complication of Foley catheter, initial encounter: acute illness or injury that poses a threat to life or bodily functions  Amount and/or Complexity of Data Reviewed Labs: ordered. Decision-making details documented in ED Course. Radiology: ordered and independent interpretation performed.  Risk Prescription drug management.   Patient presented to the ED for difficulties with his urethral catheter.  Family also noted some blood in the commode after attempting to go to the bathroom.  Family's not certain where the blood came from.  On exam in the ED there is no blood noted on rectal exam.  Patient however did have some blood noted at the urethral meatus.  In the ED the patient did have his Foley catheter exchanged.  It does seem to be draining properly now.  CT scan does not show have any evidence of urinary retention but there is some thickening of the bladder walls to suggest possible cystitis.  Patient has had some discomfort in his urine.  Certainly difficult to exclude contamination considering he does have an indwelling Foley catheter but he could have an infection complicating his catheter.  Will start him course of antibiotics.  Will also cover yeast considering the yeast noted in the urinalysis  Patient otherwise afebrile.  Family states he is at baseline.  He appears appropriate for outpatient management.     Final diagnoses:  Complication of Foley catheter, initial encounter    ED Discharge Orders          Ordered    cephALEXin  (KEFLEX ) 250 MG capsule   4 times daily        11/03/24 1801    fluconazole  (DIFLUCAN ) 200 MG tablet  Daily        11/03/24 1801               Randol Simmonds, MD 11/03/24 1805  "

## 2024-11-03 NOTE — ED Triage Notes (Signed)
 Patient has had the foley catheter since 12/29. Today woke up and noticed it was not draining at all. Feels the pressure in his abdomen. When he sat on the commode today he noticed bright red blood in the toilet. No blood thinners.

## 2024-11-03 NOTE — ED Provider Triage Note (Signed)
 Emergency Medicine Provider Triage Evaluation Note  Ryan Gilmore , a 89 y.o. male  was evaluated in triage.  Pt complains of no drainage from foley catheter x12 hours. Patient with hx of dementia. Family member noticing swelling in suprapubic area. Also states that patient went to use the bathroom today and did NOT have a BM but did have a bunch of blood in the toilet.  Review of Systems  Positive:  Negative:   Physical Exam  BP 103/71 (BP Location: Left Arm)   Pulse (!) 107   Temp 98.2 F (36.8 C)   Resp 16   Ht 5' 11 (1.803 m)   Wt 71.2 kg   SpO2 93%   BMI 21.90 kg/m  Gen:   Awake, no distress   Resp:  Normal effort  MSK:   Moves extremities without difficulty  Other:    Medical Decision Making  Medically screening exam initiated at 11:18 AM.  Appropriate orders placed.  Ryan Gilmore was informed that the remainder of the evaluation will be completed by another provider, this initial triage assessment does not replace that evaluation, and the importance of remaining in the ED until their evaluation is complete.     Hoy Nidia FALCON, [place] 11/03/24 1119

## 2024-11-03 NOTE — Discharge Instructions (Signed)
 Take the medications as prescribed.  Follow-up with his urologist to be rechecked.  Return to the ED for fevers, recurrent bleeding or other concerns

## 2024-11-04 ENCOUNTER — Telehealth: Payer: Self-pay

## 2024-11-04 NOTE — Telephone Encounter (Signed)
 Verbal order given to Mallory. Mjp,lpn  Copied from CRM #[id number]. Topic: Clinical - Home Health Verbal Orders >> Nov 04, 2024 12:28 PM Willma SAUNDERS wrote: Caller/Agency: [facility] Callback Number: [phone] Service Requested: Occupational Therapy Frequency: Move his assessment to next week, pending the weather Any new concerns about the patient? No

## 2024-11-05 LAB — URINE CULTURE: Culture: >=100000 — AB

## 2024-11-06 ENCOUNTER — Telehealth (HOSPITAL_BASED_OUTPATIENT_CLINIC_OR_DEPARTMENT_OTHER): Payer: Self-pay | Admitting: *Deleted

## 2024-11-06 ENCOUNTER — Emergency Department (HOSPITAL_COMMUNITY)

## 2024-11-06 ENCOUNTER — Observation Stay (HOSPITAL_COMMUNITY)
Admission: EM | Admit: 2024-11-06 | Source: Home / Self Care | Attending: Emergency Medicine | Admitting: Emergency Medicine

## 2024-11-06 ENCOUNTER — Other Ambulatory Visit: Payer: Self-pay

## 2024-11-06 DIAGNOSIS — E870 Hyperosmolality and hypernatremia: Secondary | ICD-10-CM

## 2024-11-06 DIAGNOSIS — R339 Retention of urine, unspecified: Principal | ICD-10-CM

## 2024-11-06 DIAGNOSIS — F01C0 Vascular dementia, severe, without behavioral disturbance, psychotic disturbance, mood disturbance, and anxiety: Secondary | ICD-10-CM | POA: Diagnosis present

## 2024-11-06 DIAGNOSIS — I152 Hypertension secondary to endocrine disorders: Secondary | ICD-10-CM | POA: Diagnosis present

## 2024-11-06 DIAGNOSIS — E1159 Type 2 diabetes mellitus with other circulatory complications: Secondary | ICD-10-CM | POA: Diagnosis present

## 2024-11-06 DIAGNOSIS — N390 Urinary tract infection, site not specified: Secondary | ICD-10-CM

## 2024-11-06 DIAGNOSIS — C8590 Non-Hodgkin lymphoma, unspecified, unspecified site: Secondary | ICD-10-CM | POA: Diagnosis present

## 2024-11-06 DIAGNOSIS — B952 Enterococcus as the cause of diseases classified elsewhere: Secondary | ICD-10-CM

## 2024-11-06 DIAGNOSIS — E1165 Type 2 diabetes mellitus with hyperglycemia: Secondary | ICD-10-CM | POA: Diagnosis present

## 2024-11-06 DIAGNOSIS — T83511A Infection and inflammatory reaction due to indwelling urethral catheter, initial encounter: Secondary | ICD-10-CM | POA: Diagnosis present

## 2024-11-06 DIAGNOSIS — E875 Hyperkalemia: Secondary | ICD-10-CM

## 2024-11-06 DIAGNOSIS — N179 Acute kidney failure, unspecified: Secondary | ICD-10-CM | POA: Diagnosis present

## 2024-11-06 LAB — CBC WITH DIFFERENTIAL/PLATELET
Abs Immature Granulocytes: 0.29 10*3/uL — ABNORMAL HIGH (ref 0.00–0.07)
Basophils Absolute: 0.1 10*3/uL (ref 0.0–0.1)
Basophils Relative: 0 %
Eosinophils Absolute: 0 10*3/uL (ref 0.0–0.5)
Eosinophils Relative: 0 %
HCT: 46.9 % (ref 39.0–52.0)
Hemoglobin: 14.1 g/dL (ref 13.0–17.0)
Immature Granulocytes: 1 %
Lymphocytes Relative: 33 %
Lymphs Abs: 9.8 10*3/uL — ABNORMAL HIGH (ref 0.7–4.0)
MCH: 28.5 pg (ref 26.0–34.0)
MCHC: 30.1 g/dL (ref 30.0–36.0)
MCV: 94.7 fL (ref 80.0–100.0)
Monocytes Absolute: 0.9 10*3/uL (ref 0.1–1.0)
Monocytes Relative: 3 %
Neutro Abs: 18.9 10*3/uL — ABNORMAL HIGH (ref 1.7–7.7)
Neutrophils Relative %: 63 %
Platelets: 331 10*3/uL (ref 150–400)
RBC: 4.95 MIL/uL (ref 4.22–5.81)
RDW: 14.7 % (ref 11.5–15.5)
WBC: 29.9 10*3/uL — ABNORMAL HIGH (ref 4.0–10.5)
nRBC: 0 % (ref 0.0–0.2)

## 2024-11-06 LAB — GLUCOSE, CAPILLARY: Glucose-Capillary: 229 mg/dL — ABNORMAL HIGH (ref 70–99)

## 2024-11-06 LAB — URINALYSIS, W/ REFLEX TO CULTURE (INFECTION SUSPECTED)
Bacteria, UA: NONE SEEN
Bilirubin Urine: NEGATIVE
Glucose, UA: >=500 mg/dL — AB
Ketones, ur: NEGATIVE mg/dL
Nitrite: NEGATIVE
Protein, ur: 100 mg/dL — AB
RBC / HPF: >50 RBC/hpf (ref 0–5)
Specific Gravity, Urine: 1.023 (ref 1.005–1.030)
WBC, UA: >50 WBC/hpf (ref 0–5)
pH: 6 (ref 5.0–8.0)

## 2024-11-06 LAB — BASIC METABOLIC PANEL WITH GFR
Anion gap: 36 — ABNORMAL HIGH (ref 5–15)
BUN: 67 mg/dL — ABNORMAL HIGH (ref 8–23)
CO2: 9 mmol/L — ABNORMAL LOW (ref 22–32)
Calcium: 10.1 mg/dL (ref 8.9–10.3)
Chloride: 103 mmol/L (ref 98–111)
Creatinine, Ser: 6.41 mg/dL — ABNORMAL HIGH (ref 0.61–1.24)
GFR, Estimated: 8 mL/min — ABNORMAL LOW
Glucose, Bld: 328 mg/dL — ABNORMAL HIGH (ref 70–99)
Potassium: 5.5 mmol/L — ABNORMAL HIGH (ref 3.5–5.1)
Sodium: 149 mmol/L — ABNORMAL HIGH (ref 135–145)

## 2024-11-06 LAB — POTASSIUM: Potassium: 6.3 mmol/L (ref 3.5–5.1)

## 2024-11-06 LAB — CBG MONITORING, ED: Glucose-Capillary: 273 mg/dL — ABNORMAL HIGH (ref 70–99)

## 2024-11-06 MED ORDER — ACETAMINOPHEN 325 MG PO TABS: 650.0000 mg | ORAL_TABLET | Freq: Four times a day (QID) | ORAL | Status: AC | PRN

## 2024-11-06 MED ORDER — SODIUM ZIRCONIUM CYCLOSILICATE 10 G PO PACK
10.0000 g | PACK | Freq: Once | ORAL | Status: AC
  Administered 2024-11-07: 10 g via ORAL
  Filled 2024-11-06: qty 1

## 2024-11-06 MED ORDER — ACETAMINOPHEN 650 MG RE SUPP
650.0000 mg | Freq: Four times a day (QID) | RECTAL | Status: AC | PRN
  Administered 2024-11-11 (×2): 650 mg via RECTAL
  Filled 2024-11-06 (×2): qty 1

## 2024-11-06 MED ORDER — SODIUM CHLORIDE 0.9 % IV SOLN
2.0000 g | INTRAVENOUS | Status: DC
  Administered 2024-11-06 – 2024-11-09 (×4): 2 g via INTRAVENOUS
  Filled 2024-11-06 (×5): qty 2000

## 2024-11-06 MED ORDER — SODIUM CHLORIDE 0.45 % IV SOLN: INTRAVENOUS | Status: AC

## 2024-11-06 MED ORDER — SENNOSIDES-DOCUSATE SODIUM 8.6-50 MG PO TABS: 1.0000 | ORAL_TABLET | Freq: Every evening | ORAL | Status: DC | PRN

## 2024-11-06 MED ORDER — ONDANSETRON HCL 4 MG PO TABS: 4.0000 mg | ORAL_TABLET | Freq: Four times a day (QID) | ORAL | Status: AC | PRN

## 2024-11-06 MED ORDER — DEXTROSE 50 % IV SOLN
1.0000 | Freq: Once | INTRAVENOUS | Status: AC
  Administered 2024-11-07: 50 mL via INTRAVENOUS
  Filled 2024-11-06: qty 50

## 2024-11-06 MED ORDER — SODIUM ZIRCONIUM CYCLOSILICATE 10 G PO PACK
10.0000 g | PACK | ORAL | Status: AC
  Administered 2024-11-06: 10 g via ORAL
  Filled 2024-11-06: qty 1

## 2024-11-06 MED ORDER — BISACODYL 5 MG PO TBEC: 5.0000 mg | DELAYED_RELEASE_TABLET | Freq: Every day | ORAL | Status: DC | PRN

## 2024-11-06 MED ORDER — SODIUM CHLORIDE 0.9 % IV SOLN
1.0000 g | INTRAVENOUS | Status: DC
  Administered 2024-11-06: 1 g via INTRAVENOUS
  Filled 2024-11-06: qty 10

## 2024-11-06 MED ORDER — LACTATED RINGERS IV BOLUS
1000.0000 mL | Freq: Once | INTRAVENOUS | Status: AC
  Administered 2024-11-06: 1000 mL via INTRAVENOUS

## 2024-11-06 MED ORDER — LACTATED RINGERS IV SOLN: INTRAVENOUS | Status: DC

## 2024-11-06 MED ORDER — INSULIN ASPART 100 UNIT/ML IJ SOLN
0.0000 [IU] | Freq: Three times a day (TID) | INTRAMUSCULAR | Status: DC
  Administered 2024-11-07: 2 [IU] via SUBCUTANEOUS
  Administered 2024-11-08: 15 [IU] via SUBCUTANEOUS
  Filled 2024-11-06: qty 2
  Filled 2024-11-06: qty 5

## 2024-11-06 MED ORDER — HEPARIN SODIUM (PORCINE) 5000 UNIT/ML IJ SOLN
5000.0000 [IU] | Freq: Three times a day (TID) | INTRAMUSCULAR | Status: DC
  Administered 2024-11-06 – 2024-11-11 (×14): 5000 [IU] via SUBCUTANEOUS
  Filled 2024-11-06 (×14): qty 1

## 2024-11-06 MED ORDER — INSULIN ASPART 100 UNIT/ML IJ SOLN
0.0000 [IU] | Freq: Every day | INTRAMUSCULAR | Status: DC
  Administered 2024-11-06 – 2024-11-09 (×3): 2 [IU] via SUBCUTANEOUS
  Administered 2024-11-10: 3 [IU] via SUBCUTANEOUS
  Filled 2024-11-06 (×3): qty 2
  Filled 2024-11-06: qty 3

## 2024-11-06 MED ORDER — TAMSULOSIN HCL 0.4 MG PO CAPS
0.4000 mg | ORAL_CAPSULE | Freq: Every day | ORAL | Status: DC
  Administered 2024-11-06 – 2024-11-07 (×2): 0.4 mg via ORAL
  Filled 2024-11-06 (×3): qty 1

## 2024-11-06 MED ORDER — INSULIN ASPART 100 UNIT/ML IV SOLN
5.0000 [IU] | Freq: Once | INTRAVENOUS | Status: AC
  Administered 2024-11-07: 5 [IU] via INTRAVENOUS
  Filled 2024-11-06: qty 5

## 2024-11-06 MED ORDER — ONDANSETRON HCL 4 MG/2ML IJ SOLN: 4.0000 mg | Freq: Four times a day (QID) | INTRAMUSCULAR | Status: AC | PRN

## 2024-11-06 NOTE — ED Provider Notes (Signed)
 " [place] EMERGENCY DEPARTMENT AT [facility] Provider Note   CSN: [id number] Arrival date & time: 11/06/24  1506     Patient presents with: Urinary Retention and Fatigue   Ryan Gilmore is a 89 y.o. male.   89 year old male presents from home with urinary retention and lethargy.  Patient seen recently for same and all records reviewed.  Patient does have a UTI currently and has antibiotics changed just recently due to urine culture.  Has history of dementia and cannot give a history at this time.  EMS was called and CBG was 415.  Was given 80 cc of lactated Ringer 's and transported here       Prior to Admission medications  Medication Sig Start Date End Date Taking? Authorizing Provider  acetaminophen  (TYLENOL ) 325 MG tablet Take 2 tablets (650 mg total) by mouth every 6 (six) hours as needed for mild pain (pain score 1-3) or fever (or Fever >/= 101). 10/06/24   Barbarann Nest, MD  amLODipine  (NORVASC ) 10 MG tablet Take 0.5 tablets (5 mg total) by mouth daily. 10/07/24 11/06/24  Barbarann Nest, MD  aspirin  EC 81 MG tablet Take 81 mg by mouth daily. Swallow whole.    [provider]  Blood Glucose Monitoring Suppl (BLOOD GLUCOSE SYSTEM PAK) KIT Please dispense based on patient and insurance preference. Use as directed to monitor FSBS 2x. Dx: E11.E11.65. 03/24/19   Duanne Butler DASEN, MD  cephALEXin  (KEFLEX ) 250 MG capsule Take 1 capsule (250 mg total) by mouth 4 (four) times daily for 7 days. 11/03/24 11/10/24  Randol Simmonds, MD  empagliflozin (JARDIANCE) 25 MG TABS tablet Take 25 mg by mouth daily.    [provider]  fluconazole  (DIFLUCAN ) 200 MG tablet Take 1 tablet (200 mg total) by mouth daily for 7 days. 11/03/24 11/10/24  Randol Simmonds, MD  glipiZIDE  (GLUCOTROL ) 5 MG tablet Take by mouth 2 (two) times daily before a meal.    [provider]  Glucose Blood (BLOOD GLUCOSE TEST STRIPS) STRP Please dispense one touch verio flex lancets, and testing strips. Use as  directed to monitor FSBS 2x. Dx: E11.E11.65. 03/30/19   Duanne Butler DASEN, MD  hydrocerin (EUCERIN) CREA Apply 1 Application topically 2 (two) times daily. Apply to feet/heels twice daily; do not place in between toes 10/06/24   Barbarann Nest, MD  Lancets MISC Please dispense based on patient and insurance preference. Use as directed to monitor FSBS 2x. Dx: E11.E11.65. 03/24/19   Duanne Butler DASEN, MD  liver oil-zinc  oxide (DESITIN) 40 % ointment Apply topically 2 (two) times daily. Apply to buttocks twice daily and as needed for soiling 10/06/24   Barbarann Nest, MD  metFORMIN  (GLUCOPHAGE ) 500 MG tablet TAKE 2 TABLETS BY MOUTH TWICE DAILY WITH A MEAL 05/09/19   Duanne Butler DASEN, MD  Multiple Vitamin (MULTIVITAMIN) tablet Take 1 tablet by mouth daily.    [provider]  mupirocin  ointment (BACTROBAN ) 2 % Apply 1 Application topically 2 (two) times daily. Apply1 gram to wound. 10/18/24 01/16/25  Christine Rush, DPM  pioglitazone  (ACTOS ) 30 MG tablet Take 1 tablet (30 mg total) by mouth daily. 06/30/23   Duanne Butler DASEN, MD  polyethylene glycol (MIRALAX ) 17 g packet Take 17 g by mouth daily. 10/09/24   Kingsley, Victoria K, DO  psyllium (METAMUCIL SMOOTH TEXTURE) 58.6 % powder Take 1 packet by mouth 3 (three) times daily. 10/09/24   Kingsley, Victoria K, DO  rosuvastatin  (CRESTOR ) 40 MG tablet Take 40 mg by  mouth daily.    [provider]  senna-docusate (SENOKOT-S) 8.6-50 MG tablet Take 1 tablet by mouth daily. 10/09/24   Kingsley, Victoria K, DO  tamsulosin  (FLOMAX ) 0.4 MG CAPS capsule Take 1 capsule (0.4 mg total) by mouth daily after supper. 10/06/24   Barbarann Nest, MD    Allergies: Patient has no known allergies.    Review of Systems  Unable to perform ROS: Dementia    Updated Vital Signs There were no vitals taken for this visit.  Physical Exam Vitals and nursing note reviewed.  Constitutional:      General: He is not in acute distress.    Appearance: Normal appearance. He is  well-developed. He is not toxic-appearing.  HENT:     Head: Normocephalic and atraumatic.  Eyes:     General: Lids are normal.     Conjunctiva/sclera: Conjunctivae normal.     Pupils: Pupils are equal, round, and reactive to light.  Neck:     Thyroid: No thyroid mass.     Trachea: No tracheal deviation.  Cardiovascular:     Rate and Rhythm: Normal rate and regular rhythm.     Heart sounds: Normal heart sounds. No murmur heard.    No gallop.  Pulmonary:     Effort: Pulmonary effort is normal. No respiratory distress.     Breath sounds: Normal breath sounds. No stridor. No decreased breath sounds, wheezing, rhonchi or rales.  Abdominal:     General: There is no distension.     Palpations: Abdomen is soft.     Tenderness: There is no abdominal tenderness. There is no rebound.  Musculoskeletal:        General: No tenderness. Normal range of motion.     Cervical back: Normal range of motion and neck supple.  Skin:    General: Skin is warm and dry.     Findings: No abrasion or rash.  Neurological:     Mental Status: He is disoriented and confused.     GCS: GCS eye subscore is 4. GCS verbal subscore is 5. GCS motor subscore is 6.     Cranial Nerves: No cranial nerve deficit.     Sensory: No sensory deficit.     Motor: No tremor.  Psychiatric:        Attention and Perception: He is inattentive.     (all labs ordered are listed, but only abnormal results are displayed) Labs Reviewed  CBC WITH DIFFERENTIAL/PLATELET  BASIC METABOLIC PANEL WITH GFR  URINALYSIS, W/ REFLEX TO CULTURE (INFECTION SUSPECTED)    EKG: EKG Interpretation Date/Time:  Sunday November 06 2024 15:37:36 EST Ventricular Rate:  102 PR Interval:  140 QRS Duration:  98 QT Interval:  346 QTC Calculation: 451 R Axis:   91  Text Interpretation: Sinus tachycardia Right axis deviation Consider left ventricular hypertrophy Nonspecific T abnormalities, lateral leads Baseline wander in lead(s) I Confirmed by Dasie Faden ([id number]) on 11/06/2024 4:09:39 PM  Radiology: No results found.   Procedures   Medications Ordered in the ED  lactated ringers  infusion (has no administration in time range)                                    Medical Decision Making Amount and/or Complexity of Data Reviewed Labs: ordered. Radiology: ordered.  Risk Prescription drug management. Decision regarding hospitalization.   [name] EKG shows sinus tachycardia.  No hyperkalemic changes.  Urinalysis consistent with  infection.  White count is elevated but looks chronically cell.  Urine culture sent.  Patient has evidence of acute kidney injury and patient given IV fluids here.  Foley catheter placed after bladder scans showed 600 cc of urine.  Some blood noted and bladder was irrigated.  Calcium  of 5.5 and treated with Lokelma .  He is also hyponatremic as well 2.  CT renal stone shows enlarged prostate which I suspect is because of the obstruction which led to his kidney injury.  Patient will require admission and will consult hospitalist team  CRITICAL CARE Performed by: Curtistine ONEIDA Dawn Total critical care time: 50 minutes Critical care time was exclusive of separately billable procedures and treating other patients. Critical care was necessary to treat or prevent imminent or life-threatening deterioration. Critical care was time spent personally by me on the following activities: development of treatment plan with patient and/or surrogate as well as nursing, discussions with consultants, evaluation of patient's response to treatment, examination of patient, obtaining history from patient or surrogate, ordering and performing treatments and interventions, ordering and review of laboratory studies, ordering and review of radiographic studies, pulse oximetry and re-evaluation of patient's condition.      Final diagnoses:  None    ED Discharge Orders     None          Dawn Curtistine, MD 11/06/24 1831  "

## 2024-11-06 NOTE — ED Notes (Signed)
 1600, pt alert to himself, follows simple commands but not aware to place, time.

## 2024-11-06 NOTE — Hospital Course (Signed)
 Ryan Gilmore is a 89 y.o. male with medical history significant for vascular dementia, chronic leukocytosis due to [facility]), T2DM, HTN, prostate cancer, BOO/urinary retention with indwelling Foley catheter who is admitted with AKI secondary to obstructive uropathy.

## 2024-11-06 NOTE — ED Notes (Signed)
 Pt family made us  aware that patient has only been eating one bowl of oatmeal and a banana each day.  Not drinking much at home either.  MD notified.

## 2024-11-06 NOTE — Telephone Encounter (Signed)
 Post ED Visit - Positive Culture Follow-up: Successful Patient Follow-Up  Culture assessed and recommendations reviewed by:  []  Rankin Dee, Pharm.D. []  Venetia Gully, Pharm.D., BCPS AQ-ID []  Garrel Crews, Pharm.D., BCPS []  Almarie Lunger, [address].D., BCPS []  [place], [address].D., BCPS, AAHIVP []  Rosaline Bihari, Pharm.D., BCPS, AAHIVP []  Vernell Meier, PharmD, BCPS []  Latanya Hint, PharmD, BCPS []  Donald Medley, PharmD, BCPS [x]  Camelia Marina, PharmD  Positive urine culture  []  Patient discharged without antimicrobial prescription and treatment is now indicated [x]  Organism is resistant to prescribed ED discharge antimicrobial []  Patient with positive blood cultures  Changes discussed with ED provider: Burnard Light, PA New antibiotic prescription Amoxil  500mg  PO TID x 7 days. (Qty 21, refills 0) Stop Cephalexin  Called to [place], [employer] patient, date 11/06/24, time 0849   Albino Alan Novak 11/06/2024, 8:49 AM

## 2024-11-06 NOTE — ED Triage Notes (Addendum)
 Pt BIBA from home. Seen 4 times for same, urinary retention and lethargy. Foley replaced Friday but no urine coming out. Alert to baseline, hx dementia. 18ga LF, 800cc LR.  124/80 Hr 106 96% ra 24 rr Etco2 16 Cbg 415

## 2024-11-06 NOTE — ED Notes (Signed)
 Pt had 800 marroon/tan urine out when new catheter inserted.  Sample sent.  Irrigation set up and running.  Fluid clearing.

## 2024-11-06 NOTE — ED Notes (Signed)
 Pt started 1 L NS bladder irrigation.  Starting to clear.   Noted that patient has pressure sore on buttocks on arrival.  Cleansed.  Barrier cream applied.
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
## 2024-11-07 LAB — GLUCOSE, CAPILLARY
Glucose-Capillary: 268 mg/dL — ABNORMAL HIGH (ref 70–99)
Glucose-Capillary: 163 mg/dL — ABNORMAL HIGH (ref 70–99)
Glucose-Capillary: 120 mg/dL — ABNORMAL HIGH (ref 70–99)
Glucose-Capillary: 112 mg/dL — ABNORMAL HIGH (ref 70–99)
Glucose-Capillary: 198 mg/dL — ABNORMAL HIGH (ref 70–99)

## 2024-11-07 LAB — BASIC METABOLIC PANEL WITH GFR
Anion gap: 38 — ABNORMAL HIGH (ref 5–15)
Anion gap: 35 — ABNORMAL HIGH (ref 5–15)
BUN: 69 mg/dL — ABNORMAL HIGH (ref 8–23)
BUN: 78 mg/dL — ABNORMAL HIGH (ref 8–23)
CO2: 8 mmol/L — ABNORMAL LOW (ref 22–32)
CO2: 8 mmol/L — ABNORMAL LOW (ref 22–32)
Calcium: 9.7 mg/dL (ref 8.9–10.3)
Calcium: 9.2 mg/dL (ref 8.9–10.3)
Chloride: 103 mmol/L (ref 98–111)
Chloride: 104 mmol/L (ref 98–111)
Creatinine, Ser: 6.87 mg/dL — ABNORMAL HIGH (ref 0.61–1.24)
Creatinine, Ser: 6.99 mg/dL — ABNORMAL HIGH (ref 0.61–1.24)
GFR, Estimated: 7 mL/min — ABNORMAL LOW
GFR, Estimated: 7 mL/min — ABNORMAL LOW
Glucose, Bld: 212 mg/dL — ABNORMAL HIGH (ref 70–99)
Glucose, Bld: 121 mg/dL — ABNORMAL HIGH (ref 70–99)
Potassium: 5.5 mmol/L — ABNORMAL HIGH (ref 3.5–5.1)
Potassium: 5.7 mmol/L — ABNORMAL HIGH (ref 3.5–5.1)
Sodium: 149 mmol/L — ABNORMAL HIGH (ref 135–145)
Sodium: 147 mmol/L — ABNORMAL HIGH (ref 135–145)

## 2024-11-07 LAB — CBC
HCT: 41.3 % (ref 39.0–52.0)
Hemoglobin: 13.1 g/dL (ref 13.0–17.0)
MCH: 28.9 pg (ref 26.0–34.0)
MCHC: 31.7 g/dL (ref 30.0–36.0)
MCV: 91 fL (ref 80.0–100.0)
Platelets: 305 10*3/uL (ref 150–400)
RBC: 4.54 MIL/uL (ref 4.22–5.81)
RDW: 15 % (ref 11.5–15.5)
WBC: 40.4 10*3/uL — ABNORMAL HIGH (ref 4.0–10.5)
nRBC: 0 % (ref 0.0–0.2)

## 2024-11-07 MED ORDER — SODIUM ZIRCONIUM CYCLOSILICATE 10 G PO PACK
10.0000 g | PACK | Freq: Once | ORAL | Status: AC
  Administered 2024-11-07: 10 g via ORAL
  Filled 2024-11-07 (×2): qty 1

## 2024-11-07 MED ORDER — ZINC OXIDE 40 % EX OINT
1.0000 | TOPICAL_OINTMENT | Freq: Two times a day (BID) | CUTANEOUS | Status: AC
  Administered 2024-11-07 – 2024-11-11 (×10): 1 via TOPICAL
  Filled 2024-11-07: qty 57

## 2024-11-07 MED ORDER — CHLORHEXIDINE GLUCONATE CLOTH 2 % EX PADS
6.0000 | MEDICATED_PAD | Freq: Every day | CUTANEOUS | Status: DC
  Administered 2024-11-08 – 2024-11-11 (×4): 6 via TOPICAL

## 2024-11-07 MED ORDER — SODIUM CHLORIDE 0.45 % IV SOLN: INTRAVENOUS | Status: DC

## 2024-11-07 MED ORDER — COLLAGENASE 250 UNIT/GM EX OINT
TOPICAL_OINTMENT | Freq: Every day | CUTANEOUS | Status: AC
  Administered 2024-11-08 – 2024-11-09 (×2): 1 via TOPICAL
  Filled 2024-11-07: qty 30

## 2024-11-07 MED ORDER — SODIUM ZIRCONIUM CYCLOSILICATE 5 G PO PACK
5.0000 g | PACK | Freq: Once | ORAL | Status: AC
  Administered 2024-11-07: 5 g via ORAL
  Filled 2024-11-07: qty 1

## 2024-11-07 NOTE — Consult Note (Addendum)
" °  CLINICAL SUPPORT TEAM - WOUND OSTOMY AND CONTINENCE TEAM  CONSULTATION SERVICES   WOC Nurse-Inpatient Note   WOC Nurse Consult Note: patient is followed by podiatry for B heel wounds; last seen 1/27 using Santyl  for L heel  Reason for Consult: buttocks and heel wounds  Wound type:1  Stage 3 Pressure Injuries B buttocks largely red moist  2.  Unstageable Pressure Injury  L heel with combination of soft and hard black eschar; R heel diabetic ulcer minimal dark brown tissue noted  Pressure Injury POA: Yes Measurement: see nursing flowsheet  Wound bed: as above  Drainage (amount, consistency, odor) see nursing flowsheet  Periwound: erythema and peeling skin noted in between buttocks wounds; ? Purplish hue to tissue surrounding ulcerations ? DTPI component  Dressing procedure/placement/frequency:  Cleanse buttocks wounds with NS, apply silver hydrofiber ([place] [phone]) to wound beds daily and secure with silicone foam.  Cleanse L heel with NS, Apply 1/4 thick layer of Santyl  to wound bed daily, top with saline moist gauze and dry gauze. Secure with Kerlix roll gauze.   Paint R heel wound with Betadine daily, allow to air dry. Apply silicone foam. Place B feet into Prevalon boots to offload pressure [id number].   POC discussed with bedside nurse. WOC team will not follow. Reconsult if further needs arise.   Patient should resume follow-up with podiatry as outpatient. Per podiatry's last note they have referred to wound care center as well.   Thank you,    Morry Veiga MSN, RN-BC, CWOCN    "

## 2024-11-07 NOTE — Plan of Care (Signed)
" °  Problem: Fluid Volume: Goal: Ability to maintain a balanced intake and output will improve Outcome: Progressing   Problem: Metabolic: Goal: Ability to maintain appropriate glucose levels will improve Outcome: Progressing   Problem: Clinical Measurements: Goal: Ability to maintain clinical measurements within normal limits will improve Outcome: Progressing   Problem: Nutrition: Goal: Adequate nutrition will be maintained Outcome: Progressing   Problem: Elimination: Goal: Will not experience complications related to bowel motility Outcome: Progressing Goal: Will not experience complications related to urinary retention Outcome: Progressing   Problem: Pain Managment: Goal: General experience of comfort will improve and/or be controlled Outcome: Progressing   Problem: Skin Integrity: Goal: Risk for impaired skin integrity will decrease Outcome: Progressing   "

## 2024-11-08 LAB — BASIC METABOLIC PANEL WITH GFR
Anion gap: 19 — ABNORMAL HIGH (ref 5–15)
BUN: 91 mg/dL — ABNORMAL HIGH (ref 8–23)
CO2: 17 mmol/L — ABNORMAL LOW (ref 22–32)
Calcium: 8.2 mg/dL — ABNORMAL LOW (ref 8.9–10.3)
Chloride: 103 mmol/L (ref 98–111)
Creatinine, Ser: 6.03 mg/dL — ABNORMAL HIGH (ref 0.61–1.24)
GFR, Estimated: 8 mL/min — ABNORMAL LOW
Glucose, Bld: 468 mg/dL — ABNORMAL HIGH (ref 70–99)
Potassium: 5.2 mmol/L — ABNORMAL HIGH (ref 3.5–5.1)
Sodium: 140 mmol/L (ref 135–145)

## 2024-11-08 LAB — GLUCOSE, CAPILLARY
Glucose-Capillary: 447 mg/dL — ABNORMAL HIGH (ref 70–99)
Glucose-Capillary: 452 mg/dL — ABNORMAL HIGH (ref 70–99)
Glucose-Capillary: 258 mg/dL — ABNORMAL HIGH (ref 70–99)
Glucose-Capillary: 66 mg/dL — ABNORMAL LOW (ref 70–99)
Glucose-Capillary: 180 mg/dL — ABNORMAL HIGH (ref 70–99)
Glucose-Capillary: 215 mg/dL — ABNORMAL HIGH (ref 70–99)

## 2024-11-08 MED ORDER — INSULIN ASPART 100 UNIT/ML IJ SOLN
0.0000 [IU] | Freq: Three times a day (TID) | INTRAMUSCULAR | Status: DC
  Administered 2024-11-08: 11 [IU] via SUBCUTANEOUS
  Administered 2024-11-09: 5 [IU] via SUBCUTANEOUS
  Administered 2024-11-09: 7 [IU] via SUBCUTANEOUS
  Administered 2024-11-09: 11 [IU] via SUBCUTANEOUS
  Administered 2024-11-10: 7 [IU] via SUBCUTANEOUS
  Administered 2024-11-10 (×2): 11 [IU] via SUBCUTANEOUS
  Administered 2024-11-11: 20 [IU] via SUBCUTANEOUS
  Filled 2024-11-08 (×2): qty 7
  Filled 2024-11-08: qty 5
  Filled 2024-11-08 (×4): qty 11
  Filled 2024-11-08: qty 20
  Filled 2024-11-08: qty 1

## 2024-11-08 MED ORDER — DEXTROSE 50 % IV SOLN
12.5000 g | INTRAVENOUS | Status: AC
  Administered 2024-11-08: 12.5 g via INTRAVENOUS
  Filled 2024-11-08: qty 50

## 2024-11-08 MED ORDER — INSULIN ASPART 100 UNIT/ML IJ SOLN
10.0000 [IU] | Freq: Once | INTRAMUSCULAR | Status: AC
  Administered 2024-11-08: 10 [IU] via SUBCUTANEOUS
  Filled 2024-11-08: qty 10

## 2024-11-08 NOTE — Progress Notes (Addendum)
 Foley was emptied before 1900. No urine output since.  Bladder scanned 451 ml. Flushed with 30 ml sterile water, pulled back and forth and no results. Flushed again with 30 ml sterile water. Pulled back and forth again. No results. Removed 10 ml from the balloon inserted in and out about an inch, the urine started to flow and two small clots noted. Replaced the 10ml of NS in the balloon. 750 ml amber urine drainage and emptied. Pt tolerated well. Will cont care.  9384 Urine sent to the lab for culture. Foley still patent, draining yellow urine no clots seen at this time.

## 2024-11-08 NOTE — Plan of Care (Signed)
" °  Problem: Metabolic: Goal: Ability to maintain appropriate glucose levels will improve Outcome: Progressing   Problem: Nutritional: Goal: Maintenance of adequate nutrition will improve Outcome: Progressing Note: Family states he ate pretty good today.   "

## 2024-11-09 ENCOUNTER — Encounter (HOSPITAL_COMMUNITY): Payer: Self-pay | Admitting: Internal Medicine

## 2024-11-09 ENCOUNTER — Inpatient Hospital Stay (HOSPITAL_COMMUNITY)

## 2024-11-09 LAB — BASIC METABOLIC PANEL WITH GFR
Anion gap: 20 — ABNORMAL HIGH (ref 5–15)
BUN: 78 mg/dL — ABNORMAL HIGH (ref 8–23)
CO2: 17 mmol/L — ABNORMAL LOW (ref 22–32)
Calcium: 8.3 mg/dL — ABNORMAL LOW (ref 8.9–10.3)
Chloride: 110 mmol/L (ref 98–111)
Creatinine, Ser: 4.2 mg/dL — ABNORMAL HIGH (ref 0.61–1.24)
GFR, Estimated: 13 mL/min — ABNORMAL LOW
Glucose, Bld: 269 mg/dL — ABNORMAL HIGH (ref 70–99)
Potassium: 4.2 mmol/L (ref 3.5–5.1)
Sodium: 147 mmol/L — ABNORMAL HIGH (ref 135–145)

## 2024-11-09 LAB — URINE CULTURE: Culture: NO GROWTH

## 2024-11-09 LAB — GLUCOSE, CAPILLARY
Glucose-Capillary: 282 mg/dL — ABNORMAL HIGH (ref 70–99)
Glucose-Capillary: 198 mg/dL — ABNORMAL HIGH (ref 70–99)
Glucose-Capillary: 216 mg/dL — ABNORMAL HIGH (ref 70–99)
Glucose-Capillary: 214 mg/dL — ABNORMAL HIGH (ref 70–99)

## 2024-11-09 MED ORDER — SODIUM CHLORIDE 0.45 % IV SOLN: INTRAVENOUS | Status: DC

## 2024-11-09 MED ORDER — DEXTROSE 5 % IV SOLN: INTRAVENOUS | Status: DC

## 2024-11-09 NOTE — Progress Notes (Addendum)
" °  Progress Note   Patient: Ryan Gilmore FMW:[id number] DOB: 22-Oct-1932 DOA: 11/06/2024     2 DOS: the patient was seen and examined on 11/09/2024   Brief hospital course:  89 y.o. male with hx of  vascular dementia, chronic leukocytosis due to [facility]), T2DM, HTN, prostate cancer, BOO/urinary retention with newly chronic indwelling Foley catheter who presented to the ED for evaluation of urinary retention and lethargy found to have Ecoli UTI and AKI. Urine culture grew enterococcus faecalis and  started on IV ampicillin  .     Assessment and Plan:  Acute kidney injury secondary to obstructive uropathy: Secondary recurrent  obstructed Foley catheter  Noted Urine is  cleared after  foley exchange   Continue IV fluid hydration indefinitely until AKI improves and PO intake is more appropriate continue Foley care per protocol Continue Flomax  Creatine is improving from 6.41 on admission to 4.20 today  Avoid nephrotoxins agents CT renal stone study > no hydronephrosis  Continue IV hydration    Enterococcus faecalis UTI associated with chronic urinary retention/BOO with indwelling Foley catheter: Urine culture 1/29 grew  Enterococcus faecalis Continue IV ampicillin    Acute encephalopathy (likely uremia vs infectious) Chronic vascular dementia Dysphagia By family, mentation at baseline (poorly verbal/interactive - Alert to person only) CT head wo> 1. Stable nonspecific bladder wall thickening given decompressed state and indwelling urinary catheter. 2. Stable marked enlargement of the prostate. 3. Retained contrast within the bilateral renal parenchyma, consistent with underlying renal dysfunction given timing of prior contrasted CT exam. Please correlate with renal function tests. 4.  Aortic Atherosclerosis (ICD10-I70.0). Keep NPO  High risk for aspiration Start on D5w at 100 cc /hours  Will need speech evaluation when he is full awake        Hyperkalemia: Corrected    Type 2 diabetes with hyperglycemia: Continue to hold  Metformin /Jardiance on hold  Continue insulin  sliding scale  Hypovolemic hypernatremia: Corrected sodium 147  and improving Continue 1/2NS    Lymphoma: Follow outpatient with oncology    Hypertension: BP control Leukocytosis  Recent on 11/07/24 > 40.4 K  Felt to secondary to urinary tract infection Will check his blood culture in the setting of encephalopathy  Not labs test today. Will repeat cbc in am    Subjective Wife and daughter at bedside . He can open eyes and squeeze hands when you ask him but not talking. No overnight event.   Physical Exam: Vitals:   11/08/24 1532 11/08/24 1958 11/09/24 0503 11/09/24 1213  BP: (!) 102/59 117/86 (!) 111/59 113/64  Pulse: (!) 105 98 93 100  Resp: 19 20 18    Temp: 98.7 F (37.1 C) 99 F (37.2 C) 99.4 F (37.4 C) 98.3 F (36.8 C)  TempSrc:    Oral  SpO2: 92% 97% 96% 96%   HEENT: Neck supple Mucous membrane > moist  Resp: No wheezes No crackles +rales CVS: s1/s2+ No JVD  ABD: soft, +BS No tender EXT: no edema Neuro : Patient is somnolent. Open eyes and squeezes hands. But not talking Data Reviewed:    Family Communication: Daughter and Wife at bedside. All questions answered   Disposition: Status is: Inpatient Remains inpatient appropriate because: admitted with acute kidney injury secondary to urinary tract infection complicated by acute encephalopathy   Planned Discharge Destination: Skilled nursing facility  Time spent: 35 minutes  Author: Jefferson Marinas, MD 11/09/2024 5:18 PM  For on call review [email or web address].  "

## 2024-11-09 NOTE — Progress Notes (Signed)
 Speech Language Pathology T Patient Details Name: Ryan Gilmore MRN: [id number] DOB: 1933-06-23 Today's Date: 11/09/2024 Time:  -     Pt has not been awake for swallow assessment past 2 days. Palliative care meeting with family tomorrow and if pt improves and eval is appropriate they will reconsult. ST to sign off.    Dustin Olam Bull  11/09/2024, 6:57 PM

## 2024-11-09 NOTE — Plan of Care (Addendum)
 Palliative-   Attempted to see patient.  He was in process of going to CT.  Will plan to see tomorrow.  Called patient's daughter and left message requesting return call to schedule meeting time.   Cassondra Stain, AGNP-C Palliative Medicine  No charge

## 2024-11-09 NOTE — Progress Notes (Signed)
 SLP Cancellation Note  Patient Details Name: Ryan Gilmore MRN: [id number] DOB: 18-May-1933   Cancelled treatment:        Continues with inability to wake to assess swallow. Dtr stated MD getting head CT. Will check again tomorrow   Dustin Olam Bull 11/09/2024, 11:48 AM

## 2024-11-09 NOTE — Progress Notes (Signed)
 OT Cancellation Note  Patient Details Name: Ryan Gilmore MRN: [id number] DOB: 21-Sep-1933   Cancelled Treatment:    Reason Eval/Treat Not Completed: Patient at procedure or test/ unavailable. Pt off the unit for CT. OT will follow and evaluate as appropriate.   [name], OTR/L  11/09/24, 2:21 PM

## 2024-11-09 NOTE — Progress Notes (Signed)
 PT Cancellation Note  Patient Details Name: Ryan Gilmore MRN: [id number] DOB: 1933/03/29   Cancelled Treatment:    Reason Eval/Treat Not Completed: Fatigue/lethargy limiting ability to participate Note Palliative consult. Will follow  for GOC. [name] PT Acute Rehabilitation Services Office [phone]   [name] Norris 11/09/2024, 3:56 PM

## 2024-11-10 ENCOUNTER — Telehealth

## 2024-11-10 LAB — BASIC METABOLIC PANEL WITH GFR
Anion gap: 18 — ABNORMAL HIGH (ref 5–15)
BUN: 54 mg/dL — ABNORMAL HIGH (ref 8–23)
CO2: 20 mmol/L — ABNORMAL LOW (ref 22–32)
Calcium: 8.6 mg/dL — ABNORMAL LOW (ref 8.9–10.3)
Chloride: 114 mmol/L — ABNORMAL HIGH (ref 98–111)
Creatinine, Ser: 2.64 mg/dL — ABNORMAL HIGH (ref 0.61–1.24)
GFR, Estimated: 22 mL/min — ABNORMAL LOW
Glucose, Bld: 279 mg/dL — ABNORMAL HIGH (ref 70–99)
Potassium: 4 mmol/L (ref 3.5–5.1)
Sodium: 153 mmol/L — ABNORMAL HIGH (ref 135–145)

## 2024-11-10 LAB — CBC
HCT: 37.9 % — ABNORMAL LOW (ref 39.0–52.0)
Hemoglobin: 11.9 g/dL — ABNORMAL LOW (ref 13.0–17.0)
MCH: 27.9 pg (ref 26.0–34.0)
MCHC: 31.4 g/dL (ref 30.0–36.0)
MCV: 88.8 fL (ref 80.0–100.0)
Platelets: 196 10*3/uL (ref 150–400)
RBC: 4.27 MIL/uL (ref 4.22–5.81)
RDW: 14.6 % (ref 11.5–15.5)
WBC: 28.2 10*3/uL — ABNORMAL HIGH (ref 4.0–10.5)
nRBC: 0 % (ref 0.0–0.2)

## 2024-11-10 LAB — GLUCOSE, CAPILLARY
Glucose-Capillary: 260 mg/dL — ABNORMAL HIGH (ref 70–99)
Glucose-Capillary: 247 mg/dL — ABNORMAL HIGH (ref 70–99)
Glucose-Capillary: 295 mg/dL — ABNORMAL HIGH (ref 70–99)
Glucose-Capillary: 266 mg/dL — ABNORMAL HIGH (ref 70–99)

## 2024-11-10 MED ORDER — DEXTROSE 5 % IV SOLN: INTRAVENOUS | Status: DC

## 2024-11-10 MED ORDER — SODIUM CHLORIDE 0.9 % IV SOLN
2.0000 g | Freq: Two times a day (BID) | INTRAVENOUS | Status: DC
  Administered 2024-11-10 – 2024-11-11 (×2): 2 g via INTRAVENOUS
  Filled 2024-11-10 (×3): qty 2000

## 2024-11-10 NOTE — Progress Notes (Signed)
 OT Cancellation Note  Patient Details Name: Ryan Gilmore MRN: [id number] DOB: 1933/09/19   Cancelled Treatment:    Reason Eval/Treat Not Completed: Fatigue/lethargy limiting ability to participate Unable to participate in OT eval this date. Will continue to follow pt and attempt when appropriate.   Leita Howell, OTR/L,CBIS  Supplemental OT - [facility] and [facility] Secure Chat Preferred   11/10/2024, 3:50 PM

## 2024-11-10 NOTE — Plan of Care (Signed)

## 2024-11-10 NOTE — Consult Note (Signed)
 "                                                                                   Consultation Note Date: 11/10/2024   Patient Name: Ryan Gilmore  DOB: [birth year]  MRN: [id number]  Age / Sex: 89 y.o., male  PCP: Duanne Butler DASEN, MD Referring Physician: Mickle Magnus, MD  Reason for Consultation:  goals of care, possible transition to hospice/comfort if no real recovery  HPI/Patient Profile: 89 y.o. 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 Coli UTI and severe acute kidney injury due to obstructive uropathy. He was admitted and started on IV antibiotics. He has had ongoing encephalopathy- has not been awake in four days. Palliative medicine consulted for assistance with medical decision making.   Primary Decision Maker NEXT OF KIN- spouse Forrest Jaroszewski, and daughter Gwenlyn Samuel  Discussion: Chart reviewed- attending not reviewed- patient with ongoing encephalopathy. CT head reviewed- no acute findings. EEG reviewed- encephalopathy- no seizures.  Labs show severe AKI on admission- Cr up to 6.99- now trending down to 2.64. BUN remains elevated at 54. Discussed with SLP and OT- patient has not been awake enough to participate in assessments.  I met at the bedside with patient's daughter, spouse, and sister in law.  Bran is known as kind and loving. Adored his spouse and children. He is one of 20 siblings. He served in the [employer].  Prior to admission he had limited ambulation. Most days was bed to chair. He slept more than he was awake. Unable to dress himself. Was able to feed himself. Limited vocabulary.  We discussed his acute and chronic illnesses and how they interact. Often acute illnesses and particularly UTIs in elderly patients who already have underlying cognitive changes can cause a  worsening of the cognitive state. Sometimes delirium is temporary, however, sometimes the dementia is worsened and patient is at a new baseline.  We discussed that even if patient does recover some cognition- he will likely be at a lower functional state, deconditioned, bedbound, and not likely to recover to his previous level of function.  Continued medical interventions including IV fluids, antibiotics and artificial feeding were discussed vs transition to comfort measures and allowing natural end of life with comfort and symptom management reviewed.  Hospice philosophy of care and services presented.  Code status was discussed. Encouraged patient/family to consider DNR/DNI status understanding evidenced based poor outcomes in similar hospitalized patients, as the cause of the arrest is likely associated with chronic/terminal disease rather than a reversible acute cardio-pulmonary event.  Family agreed that changing code status to DNR was appropriate.  Emotional support provided as family shared feelings regarding patient's situation.    SUMMARY OF RECOMMENDATIONS- -Recurrent UTI in the setting of severe dementia, failure to thrive- family considering transition to comfort measures only- would like to discuss with other family members- plan to continue current interventions for now -Code status changed to DNR -All above discussed with attending Dr. Mickle    Code Status/Advance Care  Planning:   Code Status: Limited: Do not attempt resuscitation (DNR) -DNR-LIMITED -Do Not Intubate/DNI     Prognosis:   Unable to determine- likely less than 2 weeks if transitioned to comfort measures  Discharge Planning: To Be Determined  Primary Diagnoses: Present on Admission:  Acute kidney injury  Infection associated with indwelling urinary catheter, initial encounter  Type 2 diabetes mellitus with hyperglycemia, without long-term current use of insulin  (HCC)  Hypertension associated with diabetes  (HCC)  Lymphoma (HCC)  Severe vascular dementia (HCC)   Review of Systems  Unable to perform ROS: Mental status change    Physical Exam Vitals and nursing note reviewed.  Constitutional:      General: He is not in acute distress.    Appearance: He is ill-appearing.     Comments: frail  Cardiovascular:     Rate and Rhythm: Normal rate.  Pulmonary:     Effort: Pulmonary effort is normal.  Skin:    General: Skin is warm and dry.     Vital Signs: BP 130/68 (BP Location: Left Arm)   Pulse (!) 106   Temp 98.6 F (37 C) (Axillary)   Resp 18   Ht 5' 11 (1.803 m)   SpO2 99%   BMI 21.90 kg/m  Pain Scale: Faces   Pain Score: 0-No pain   SpO2: SpO2: 99 % O2 Device:SpO2: 99 % O2 Flow Rate: .O2 Flow Rate (L/min): 3 L/min  IO: Intake/output summary:  Intake/Output Summary (Last 24 hours) at 11/10/2024 1453 Last data filed at 11/10/2024 0500 Gross per 24 hour  Intake 1338.63 ml  Output 2025 ml  Net -686.37 ml    LBM: Last BM Date : 11/07/24 Baseline Weight:   Most recent weight:         Thank you for this consult. Palliative medicine will continue to follow and assist as needed.  Time Total: 95 minutes Signed by: Cassondra Stain, AGNP-C Palliative Medicine  Time includes:   I personally spent a total of 95 minutes in the care of the patient today including preparing to see the patient, getting/reviewing separately obtained history, performing a medically appropriate exam/evaluation, counseling and educating, placing orders, referring and communicating with other health care professionals, and documenting clinical information in the EHR.   Please contact Palliative Medicine Team phone at [phone] for questions and concerns.  For individual provider: See Amion               "

## 2024-11-10 NOTE — Progress Notes (Signed)
 PT Cancellation Note  Patient Details Name: Ryan Gilmore MRN: [id number] DOB: [birth year]   Cancelled Treatment:    Reason Eval/Treat Not Completed: Fatigue/lethargy limiting ability to participate, Palliative following.  Ryan Gilmore PT Acute Rehabilitation Services Office [phone]    Ryan Gilmore 11/10/2024, 3:36 PM

## 2024-11-10 NOTE — Inpatient Diabetes Management (Signed)
 Inpatient Diabetes Program Recommendations  AACE/ADA: New Consensus Statement on Inpatient Glycemic Control (2015)  Target Ranges:  Prepandial:   less than 140 mg/dL      Peak postprandial:   less than 180 mg/dL (1-2 hours)      Critically ill patients:  140 - 180 mg/dL   Lab Results  Component Value Date   GLUCAP 247 (H) 11/10/2024   HGBA1C 10.3 (H) 10/02/2024    Review of Glycemic Control  Diabetes history: DM2 Outpatient Diabetes medications: Jardiance 25 mg daily, metformin  1000 mg BID Current orders for Inpatient glycemic control: Novolog  0-20 TID with meals and 0-5 HS  Inpatient Diabetes Program Recommendations:    Consider adding Lantus  10 units Q24H if appropriate  Continue to follow.  Thank you. Shona Brandy, RD, LDN, CDCES Inpatient Diabetes Coordinator [phone]

## 2024-11-10 NOTE — Plan of Care (Signed)
" °  Problem: Fluid Volume: Goal: Ability to maintain a balanced intake and output will improve Outcome: Progressing   Problem: Metabolic: Goal: Ability to maintain appropriate glucose levels will improve Outcome: Progressing   Problem: Nutritional: Goal: Maintenance of adequate nutrition will improve Outcome: Progressing   Problem: Skin Integrity: Goal: Risk for impaired skin integrity will decrease Outcome: Progressing   Problem: Elimination: Goal: Will not experience complications related to bowel motility Outcome: Progressing   "

## 2024-11-10 NOTE — Progress Notes (Addendum)
 " Progress Note   Patient: Ryan Gilmore FMW:[id number] DOB: 1933/07/09 DOA: 11/06/2024     3 DOS: the patient was seen and examined on 11/10/2024   Brief hospital course:  89 y.o. male with hx of  vascular dementia, chronic leukocytosis due to [facility]), T2DM, HTN, prostate cancer, BOO/urinary retention with newly chronic indwelling Foley catheter who presented to the ED for evaluation of urinary retention and lethargy found to have Ecoli UTI and AKI. Urine culture grew enterococcus faecalis and  started on IV ampicillin  . Patient is not showing significant improvement since admission.  Discussed with wife and daughter today about palliative and agreed to meet them.seen by palliative today > family has decided to change code status to DNR limited    Assessment and Plan:  Acute kidney injury secondary to obstructive uropathy: Secondary recurrent  obstructed Foley catheter  Noted Urine is  cleared after  foley exchange  continue Foley care per protocol Continue Flomax  Creatine is improving from 6.41 on admission to 2.64 Avoid nephrotoxins agents CT renal stone study > no hydronephrosis  Continue IV hydration    Enterococcus faecalis UTI associated with chronic urinary retention/BOO with indwelling Foley catheter: Urine culture 1/29 grew  Enterococcus faecalis Continue IV ampicillin    Acute encephalopathy (likely uremia vs infectious) Chronic vascular dementia Dysphagia By family, mentation at baseline (poorly verbal/interactive - Alert to person only) CT head wo> 1. Stable nonspecific bladder wall thickening given decompressed state and indwelling urinary catheter. 2. Stable marked enlargement of the prostate. 3. Retained contrast within the bilateral renal parenchyma, consistent with underlying renal dysfunction given timing of prior contrasted CT exam. Please correlate with renal function tests. 4.  Aortic Atherosclerosis (ICD10-I70.0). High risk for  aspiration Continue  D5w at 100 cc /hours  Patient is still very encephalopathy. Keep NPO Palliative discussed with family and code status changed to DNR      Hyperkalemia: Corrected    Type 2 diabetes with hyperglycemia: Continue to hold  Metformin /Jardiance on hold  Not well control  Increase insulin  sliding scale to moderate  Hypovolemic hypernatremia: Corrected sodium 153  and trending IV fluid change to D5w     Lymphoma: Follow outpatient with oncology    Hypertension: BP control Leukocytosis  White cell count continue to trend down. Recent WBC 28.2 K  Felt to secondary to urinary tract infection    Subjective Wife and daughter at bedside . No significant improvement since last seen yesterday. Barely open his eyes. Not following commend. No event during the night . Family talked with palliative care. He is DNR and family thinking for possible transition to comfort care  Physical Exam: Vitals:   11/09/24 2006 11/09/24 2100 11/10/24 0528 11/10/24 1340  BP: 127/74  129/69 130/68  Pulse: 94  (!) 102 (!) 106  Resp: 18  18   Temp: 100.1 F (37.8 C) 99.5 F (37.5 C) 98.6 F (37 C)   TempSrc:  Axillary Axillary   SpO2: 99%  97% 99%  Height:       HEENT: Neck supple Mucous membrane > moist  Resp: No wheezes No crackles +rales CVS: s1/s2+ No JVD  ABD: soft, +BS No tender EXT: no edema Neuro : Patient is somnolent. Open eyes and squeezes hands. But not talking Data Reviewed:    Family Communication: Daughter and Wife at bedside. All questions answered   Disposition: Status is: Inpatient Remains inpatient appropriate because: admitted with acute kidney injury secondary to urinary tract infection  complicated by acute encephalopathy . Patient is critical ill. Not improving despite being on IV antibiotic. He is DNR  and family is thinking about possible transition to comfort care   Planned Discharge Destination: Skilled nursing facility  Time spent: 35  minutes  Author: Jefferson Marinas, MD 11/10/2024 3:49 PM  For on call review [email or web address].  "

## 2024-11-10 NOTE — Plan of Care (Signed)
 Palliative-  Received message from patient's daughter Ryan Gilmore.  Returned her call- left a message.   Cassondra Stain, AGNP-C Palliative Medicine  No charge

## 2024-11-11 LAB — CULTURE, BLOOD (ROUTINE X 2)
Culture: NO GROWTH
Culture: NO GROWTH
Special Requests: ADEQUATE
Special Requests: ADEQUATE

## 2024-11-11 LAB — GLUCOSE, CAPILLARY: Glucose-Capillary: 389 mg/dL — ABNORMAL HIGH (ref 70–99)

## 2024-11-11 MED ORDER — HYDROMORPHONE HCL 1 MG/ML IJ SOLN: 0.5000 mg | INTRAMUSCULAR | Status: AC | PRN

## 2024-11-11 MED ORDER — LORAZEPAM 2 MG/ML IJ SOLN: 1.0000 mg | INTRAMUSCULAR | Status: AC | PRN

## 2024-11-11 MED ORDER — HALOPERIDOL LACTATE 5 MG/ML IJ SOLN: 0.5000 mg | INTRAMUSCULAR | Status: AC | PRN

## 2024-11-11 MED ORDER — LORAZEPAM 1 MG PO TABS: 1.0000 mg | ORAL_TABLET | ORAL | Status: AC | PRN

## 2024-11-11 MED ORDER — POLYVINYL ALCOHOL 1.4 % OP SOLN: 1.0000 [drp] | Freq: Four times a day (QID) | OPHTHALMIC | Status: AC | PRN

## 2024-11-11 MED ORDER — GLYCOPYRROLATE 0.2 MG/ML IJ SOLN: 0.2000 mg | INTRAMUSCULAR | Status: AC | PRN

## 2024-11-11 MED ORDER — GLYCOPYRROLATE 1 MG PO TABS: 1.0000 mg | ORAL_TABLET | ORAL | Status: AC | PRN

## 2024-11-11 MED ORDER — HALOPERIDOL LACTATE 2 MG/ML PO CONC: 0.5000 mg | ORAL | Status: AC | PRN

## 2024-11-11 MED ORDER — BIOTENE DRY MOUTH MT LIQD: 15.0000 mL | OROMUCOSAL | Status: AC | PRN

## 2024-11-11 MED ORDER — HALOPERIDOL 0.5 MG PO TABS: 0.5000 mg | ORAL_TABLET | ORAL | Status: AC | PRN

## 2024-11-11 MED ORDER — LORAZEPAM 2 MG/ML PO CONC
1.0000 mg | ORAL | Status: AC | PRN
  Filled 2024-11-11: qty 0.5

## 2024-11-11 NOTE — Progress Notes (Signed)
" °   11/11/24 1258  Spiritual Encounters  Type of Visit Initial  Care provided to: Pt and family  Referral source Clinical staff  Reason for visit End-of-life  OnCall Visit No   SPIRITUAL CARE AND COUNSELING CONSULT NOTE   VISIT SUMMARY   SPIRITUAL ENCOUNTER                                                                                                                                                                       Met with Mrs Lacharles Altschuler and daughter, Cam at bedside. Mr Formica is somnolent but seems comfortable and occasionally responding through eye contact. Mrs Juday shared they have had 79yrs of happy marriage. Story sharing, memories and shared that Mr Eskelson had a non-identical twin that passed 64yrs ago.   SPIRITUAL FRAMEWORK    Christian faith tradition.  GOALS     Comfort, presence with family and transition to [facility]  INTERVENTIONS     Facilitated story sharing and memories, naming the significance within that, naming goals for Mr Baller's end of life care, encouraged continued talking, music.   INTERVENTION OUTCOMES    Appreciated visit and shared sense of increasing acceptance. Looking forward to other family joining tomorrow  SPIRITUAL CARE PLAN     Continued support as desired. Let family know that Chaplain Christopher Kiang would be available on Saturday for listening, grief support, prayer.   If immediate needs arise, please contact [facility] / Behavioral Health 24 hour on call [phone]   Greig LITTIE Daring, Elia  11/11/2024 2:42 PM  "

## 2024-11-11 NOTE — TOC Progression Note (Addendum)
 Transition of Care [facility]) - Progression Note    Patient Details  Name: Ryan Gilmore MRN: [id number] Date of Birth: Nov 04, 1932  Transition of [facility]) CM/SW Contact  Doneta Glenys DASEN, RN Phone Number: 11/11/2024, 10:32 AM  Clinical Narrative:    3:05 PM Patient has been approved for [facility] but they do not have a bed available today. We'll follow up tomorrow.  10:32 AM CM consult for [facility]. Eleanor Nail notified of family request.   Expected Discharge Plan: Home w Home Health Services Barriers to Discharge: Continued Medical Work up               Expected Discharge Plan and Services In-house Referral: NA Discharge Planning Services: NA   Living arrangements for the past 2 months: Single Family Home                 DME Arranged: N/A DME Agency: NA       HH Arranged: NA HH Agency: NA         Social Drivers of Health (SDOH) Interventions SDOH Screenings   Food Insecurity: No Food Insecurity (11/06/2024)  Housing: Low Risk (11/06/2024)  Transportation Needs: No Transportation Needs (11/06/2024)  Utilities: Not At Risk (11/06/2024)  Alcohol  Screen: Low Risk (07/16/2023)  Depression (PHQ2-9): Low Risk (10/11/2024)  Financial Resource Strain: Low Risk (07/16/2023)  Physical Activity: Inactive (07/16/2023)  Social Connections: Moderately Isolated (11/06/2024)  Stress: No Stress Concern Present (07/16/2023)  Tobacco Use: Medium Risk (11/09/2024)  Health Literacy: Adequate Health Literacy (07/16/2023)    Readmission Risk Interventions    11/08/2024    4:10 PM 10/03/2024   12:31 PM  Readmission Risk Prevention Plan  Post Dischage Appt  Complete  Medication Screening  Complete  Transportation Screening Complete Complete  PCP or Specialist Appt within 3-5 Days Complete   HRI or Home Care Consult Complete   Social Work Consult for Recovery Care Planning/Counseling Complete   Palliative Care Screening Complete   Medication Review Oceanographer)  Complete

## 2024-11-11 NOTE — Progress Notes (Signed)
 "                                                                                                                                                                                                          Daily Progress Note   Patient Name: Ryan Gilmore       Date: 11/11/2024 DOB: 02/27/1933  Age: 89 y.o. MRN#: [id number] Attending Physician: Mickle Magnus, MD Primary Care Physician: Duanne Butler DASEN, MD Admit Date: 11/06/2024  Reason for Follow-up: Establishing goals of care  Patient Profile/HPI:  89 y.o. 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 Coli UTI and severe acute kidney injury due to obstructive uropathy. He was admitted and started on IV antibiotics. He has had ongoing encephalopathy- has not been awake in four days. Palliative medicine consulted for assistance with medical decision making.   Discussion: Chart reviewed including MD notes and labs.  Patient continues to be minimally responsive. He does not wake or make attempts to open his eyes on my assessment.  Met at bedside with patient's daughter and spouse.  [name] spouse struggling emotionally with thought of her husband dying. Emotional support provided.  Daughter notes this morning spouse forgot some of our conversation from yesterday.  We revisited that unfortunately patient was continuing to do poorly. Recommendations for comfort measures were reviewed.  Cam and Blima are ready for patient to be transitioned to full comfort measures only. Discussed transition to comfort measures only which includes stopping IV fluids, antibiotics, labs and providing symptom management for SOB, anxiety, nausea, vomiting, and other symptoms of dying.  We discussed hospice services and philosophy of care.  Family would like him to be evaluated for  placement at [facility].    Review of Systems  Unable to perform ROS: Mental status change     Physical Exam Vitals and nursing note reviewed.  Constitutional:      General: He is not in acute distress.    Appearance: He is ill-appearing.  Cardiovascular:     Rate and Rhythm: Normal rate.  Pulmonary:     Effort: Pulmonary effort is normal.  Skin:    General: Skin is warm and dry.  Neurological:     Comments: unresponsive             Vital Signs: BP (!) 121/58 (BP Location: Right Arm)   Pulse 95   Temp (!) 100.6  F (38.1 C) (Oral)   Resp 20   Ht 5' 11 (1.803 m)   SpO2 98%   BMI 21.90 kg/m  SpO2: SpO2: 98 % O2 Device: O2 Device: Nasal Cannula O2 Flow Rate: O2 Flow Rate (L/min): 3 L/min  Intake/output summary:  Intake/Output Summary (Last 24 hours) at 11/11/2024 1025 Last data filed at 11/10/2024 2030 Gross per 24 hour  Intake --  Output 325 ml  Net -325 ml   LBM: Last BM Date : 11/07/24 Baseline Weight:   Most recent weight:         Palliative Assessment/Data: PPS: 10%      Patient Active Problem List   Diagnosis Date Noted   Acute kidney injury 11/06/2024   Infection associated with indwelling urinary catheter, initial encounter 11/06/2024   Hyperkalemia 11/06/2024   Hypernatremia 11/06/2024   Healing pressure injury, stage 1 10/20/2024   Type 2 diabetes mellitus with hyperglycemia, without long-term current use of insulin  (HCC) 10/20/2024   Constipation 10/20/2024   Pressure injury 10/05/2024   Acute urinary retention 10/05/2024   Fall at home, initial encounter 10/02/2024   Generalized weakness 10/02/2024   COVID-19 virus infection 10/02/2024   Severe vascular dementia (HCC) 10/02/2024   Enterococcus UTI 06/12/2022   Prostate cancer (HCC) 06/12/2022   Lymphoma (HCC) 06/12/2022   Hyperglycemia due to diabetes mellitus (HCC) 06/12/2022   DVT, recurrent, lower extremity, chronic, bilateral    Hypomagnesemia    Palliative care by specialist     DNR (do not resuscitate) discussion    Vascular dementia without behavioral disturbance (HCC)    FTT (failure to thrive) in adult    Acute pulmonary embolism without acute cor pulmonale (HCC) 04/26/2019   Hypertension associated with diabetes (HCC) 04/26/2019   AKI (acute kidney injury) 04/26/2019   Chronic midline low back pain without sciatica 10/12/2017   Diabetes mellitus type 2, noninsulin dependent (HCC) 06/28/2013   Mixed hyperlipidemia 06/28/2013    Palliative Care Assessment & Plan    Assessment/Recommendations/Plan  Severe acute kidney injury in setting of obstructive uropathy, UTI, severe encephalopathy in setting of advanced dementia- plan is for comfort measures only Symptom management orders:  0.5 hydromorphone  IV q15 min prn for any signs of discomfort Lorazepam  1mg  po, SL or IV q4 hr anxiety Haldol  0.5mg  po, SL or IV q4 hr PRN agitation Glycopyrrolate  0.2mg  IV q4hr secretions D/C labs, meds and interventions not necessary for comfort Spiritual consult order placed Transition of care order placed for hospice referral Discussed above with attending Dr. Mickle and bedside RN    Code Status:   Code Status: Do not attempt resuscitation (DNR) - Comfort care   Prognosis:  < 2 weeks  Discharge Planning: Hospice facility- pending eligibility and bed availability    Thank you for allowing the Palliative Medicine Team to assist in the care of this patient.  I personally spent a total of 60 minutes in the care of the patient today including preparing to see the patient, getting/reviewing separately obtained history, performing a medically appropriate exam/evaluation, counseling and educating, placing orders, referring and communicating with other health care professionals, and documenting clinical information in the EHR.   Cassondra Stain, AGNP-C Palliative Medicine   Please contact Palliative Medicine Team phone at [phone] for questions and concerns.        "

## 2024-11-11 NOTE — Plan of Care (Signed)

## 2024-11-11 NOTE — Progress Notes (Signed)
 " Progress Note   Patient: Ryan Gilmore FMW:[id number] DOB: 11-Jul-1933 DOA: 11/06/2024     4 DOS: the patient was seen and examined on 11/11/2024   Brief hospital course:  89 y.o. male with hx of  vascular dementia, chronic leukocytosis due to [facility] ([facility]), T2DM, HTN, prostate cancer, BOO/urinary retention with newly chronic indwelling Foley catheter who presented to the ED for evaluation of urinary retention and lethargy found to have Ecoli UTI and AKI. Urine culture grew enterococcus faecalis and  started on IV ampicillin  . Patient is not showing significant improvement since admission.  Discussed with wife and daughter today about palliative and agreed to meet them.seen by palliative today > family has decided to change code status to DNR limited.  Patient has not improved since admission despite treatment he is receiving. After talking with palliative care , Family has decided to transition to comfort care     Assessment and Plan:  Acute kidney injury secondary to obstructive uropathy: Secondary recurrent  obstructed Foley catheter  Noted Urine is  cleared after  foley exchange  continue Foley care per protocol Creatine is improving from 6.41 on admission to 2.64 Avoid nephrotoxins agents CT renal stone study > no hydronephrosis    Enterococcus faecalis UTI associated with chronic urinary retention/BOO with indwelling Foley catheter: Urine culture 1/29 grew  Enterococcus faecalis Discontinue IV ampicillin  Patient transitioned to comfort care    Acute encephalopathy (likely uremia vs infectious) Chronic vascular dementia Dysphagia By family, mentation at baseline (poorly verbal/interactive - Alert to person only) CT head wo> 1. Stable nonspecific bladder wall thickening given decompressed state and indwelling urinary catheter. 2. Stable marked enlargement of the prostate. 3. Retained contrast within the bilateral renal parenchyma, consistent with  underlying renal dysfunction given timing of prior contrasted CT exam. Please correlate with renal function tests. 4.  Aortic Atherosclerosis (ICD10-I70.0). High risk for aspiration Patient is still very encephalopathy. Keep NPO Palliative discussed with family and code status changed to DNR Discontinued IV fluid       Hyperkalemia: Corrected    Type 2 diabetes with hyperglycemia: Continue to hold  Metformin /Jardiance on hold  Not well control  Increase insulin  sliding scale to moderate  Hypovolemic hypernatremia: Corrected sodium 153  and trending    Lymphoma: Follow outpatient with oncology    Hypertension: BP control Leukocytosis  White cell count continue to trend down. Recent WBC 28.2 K  Felt to secondary to urinary tract infection    Subjective Wife and daughter at bedside . Patient is encephalopathic. Not following any commend. Code was changed to DNR yesterday. Seen by palliative care this morning, Family decided to transition to comfort care  Physical Exam: Vitals:   11/10/24 0528 11/10/24 1340 11/10/24 2203 11/11/24 0500  BP: 129/69 130/68 (!) 111/57 (!) 121/58  Pulse: (!) 102 (!) 106 95 95  Resp: 18  20 20   Temp: 98.6 F (37 C)  99.5 F (37.5 C) (!) 100.6 F (38.1 C)  TempSrc: Axillary  Oral Oral  SpO2: 97% 99% 99% 98%  Height:       HEENT: Resp: Decrease breath sound Rales/no wheezes CVS: s1/s2+ No JVD ABD: soft, +BS No tender EXT: no edema  Data Reviewed:    Family Communication: Daughter and Wife at bedside. All questions answered. Patient transitioned to comfort care    Disposition: Status is: Inpatient Remains inpatient appropriate because: admitted with acute kidney injury secondary to urinary tract infection complicated by acute encephalopathy .  Patient is critical ill. Not improving despite being on IV antibiotic. He is DNR . Family has decided to transition to comfort care .    Planned Discharge Destination: [facility]    Time spent: 35 minutes  Author: Jefferson Marinas, MD 11/11/2024 10:23 AM  For on call review [email or web address].  "

## 2024-11-11 NOTE — Progress Notes (Signed)
 [id number] Liaison Note  Received request from [facility] for family interest in [facility]. Eligibility confirmed. Met with patient and family to confirm interest and explain services.   Unfortunately, [facility] is unable to offer a bed today. ICM aware that liaison will follow up tomorrow or sooner if a bed becomes available.   Thank you for allowing us  to participate in this patient's care.  Eleanor Nail, LPN [facility] Liaison [phone]

## 2024-11-15 ENCOUNTER — Ambulatory Visit: Admitting: Podiatry

## 2024-11-22 ENCOUNTER — Ambulatory Visit: Admitting: Family Medicine

## 2024-11-30 ENCOUNTER — Ambulatory Visit: Admitting: Podiatry
